# Patient Record
Sex: Female | Born: 1950 | Race: Asian | Hispanic: No | Marital: Married | Smoking: Never smoker
Health system: Southern US, Community
[De-identification: ages and names within clinical notes are randomized; demographics above are authoritative.]

## PROBLEM LIST (undated history)

## (undated) DIAGNOSIS — Z9289 Personal history of other medical treatment: Secondary | ICD-10-CM

## (undated) DIAGNOSIS — I10 Essential (primary) hypertension: Secondary | ICD-10-CM

## (undated) DIAGNOSIS — I2510 Atherosclerotic heart disease of native coronary artery without angina pectoris: Secondary | ICD-10-CM

## (undated) DIAGNOSIS — I5021 Acute systolic (congestive) heart failure: Secondary | ICD-10-CM

## (undated) DIAGNOSIS — E785 Hyperlipidemia, unspecified: Secondary | ICD-10-CM

## (undated) DIAGNOSIS — Z931 Gastrostomy status: Secondary | ICD-10-CM

## (undated) DIAGNOSIS — Z930 Tracheostomy status: Secondary | ICD-10-CM

## (undated) HISTORY — PX: TUBAL LIGATION: SHX77

---

## 1998-11-03 ENCOUNTER — Emergency Department (HOSPITAL_COMMUNITY): Admission: EM | Admit: 1998-11-03 | Discharge: 1998-11-03 | Payer: Self-pay | Admitting: Emergency Medicine

## 2003-05-31 ENCOUNTER — Emergency Department (HOSPITAL_COMMUNITY): Admission: EM | Admit: 2003-05-31 | Discharge: 2003-06-01 | Payer: Self-pay | Admitting: Emergency Medicine

## 2011-01-25 ENCOUNTER — Inpatient Hospital Stay (INDEPENDENT_AMBULATORY_CARE_PROVIDER_SITE_OTHER)
Admission: RE | Admit: 2011-01-25 | Discharge: 2011-01-25 | Disposition: A | Discharge status: Home / Self Care | Payer: No Typology Code available for payment source | Source: Ambulatory Visit | Attending: Family Medicine | Admitting: Family Medicine

## 2011-01-25 ENCOUNTER — Emergency Department (HOSPITAL_COMMUNITY)
Admission: EM | Admit: 2011-01-25 | Discharge: 2011-01-26 | Disposition: A | Discharge status: Home / Self Care | Payer: No Typology Code available for payment source | Attending: Emergency Medicine | Admitting: Emergency Medicine

## 2011-01-25 ENCOUNTER — Emergency Department (HOSPITAL_COMMUNITY): Payer: Self-pay

## 2011-01-25 DIAGNOSIS — S239XXA Sprain of unspecified parts of thorax, initial encounter: Secondary | ICD-10-CM | POA: Insufficient documentation

## 2011-01-25 DIAGNOSIS — S139XXA Sprain of joints and ligaments of unspecified parts of neck, initial encounter: Secondary | ICD-10-CM | POA: Insufficient documentation

## 2011-01-25 DIAGNOSIS — R030 Elevated blood-pressure reading, without diagnosis of hypertension: Secondary | ICD-10-CM | POA: Insufficient documentation

## 2011-01-25 DIAGNOSIS — M542 Cervicalgia: Secondary | ICD-10-CM | POA: Insufficient documentation

## 2011-01-25 DIAGNOSIS — M546 Pain in thoracic spine: Secondary | ICD-10-CM | POA: Insufficient documentation

## 2011-01-25 DIAGNOSIS — I10 Essential (primary) hypertension: Secondary | ICD-10-CM

## 2013-05-20 ENCOUNTER — Ambulatory Visit: Payer: Managed Care, Other (non HMO)

## 2014-07-10 ENCOUNTER — Inpatient Hospital Stay (HOSPITAL_COMMUNITY)
Admission: EM | Admit: 2014-07-10 | Discharge: 2014-07-15 | DRG: 280 | Disposition: A | Discharge status: Home / Self Care | Payer: Managed Care, Other (non HMO) | Attending: Internal Medicine | Admitting: Internal Medicine

## 2014-07-10 ENCOUNTER — Ambulatory Visit (INDEPENDENT_AMBULATORY_CARE_PROVIDER_SITE_OTHER): Payer: Managed Care, Other (non HMO)

## 2014-07-10 ENCOUNTER — Encounter (HOSPITAL_COMMUNITY): Payer: Self-pay

## 2014-07-10 ENCOUNTER — Ambulatory Visit (INDEPENDENT_AMBULATORY_CARE_PROVIDER_SITE_OTHER): Payer: Managed Care, Other (non HMO) | Admitting: Family Medicine

## 2014-07-10 VITALS — BP 132/80 | HR 108 | Temp 97.9°F | Resp 18 | Ht 59.5 in | Wt 112.0 lb

## 2014-07-10 DIAGNOSIS — I509 Heart failure, unspecified: Secondary | ICD-10-CM

## 2014-07-10 DIAGNOSIS — D72829 Elevated white blood cell count, unspecified: Secondary | ICD-10-CM | POA: Diagnosis present

## 2014-07-10 DIAGNOSIS — I255 Ischemic cardiomyopathy: Secondary | ICD-10-CM | POA: Diagnosis not present

## 2014-07-10 DIAGNOSIS — R0600 Dyspnea, unspecified: Secondary | ICD-10-CM | POA: Diagnosis not present

## 2014-07-10 DIAGNOSIS — E785 Hyperlipidemia, unspecified: Secondary | ICD-10-CM

## 2014-07-10 DIAGNOSIS — I340 Nonrheumatic mitral (valve) insufficiency: Secondary | ICD-10-CM

## 2014-07-10 DIAGNOSIS — R942 Abnormal results of pulmonary function studies: Secondary | ICD-10-CM | POA: Diagnosis not present

## 2014-07-10 DIAGNOSIS — D649 Anemia, unspecified: Secondary | ICD-10-CM

## 2014-07-10 DIAGNOSIS — I214 Non-ST elevation (NSTEMI) myocardial infarction: Secondary | ICD-10-CM | POA: Diagnosis present

## 2014-07-10 DIAGNOSIS — I5022 Chronic systolic (congestive) heart failure: Secondary | ICD-10-CM | POA: Insufficient documentation

## 2014-07-10 DIAGNOSIS — Z7982 Long term (current) use of aspirin: Secondary | ICD-10-CM

## 2014-07-10 DIAGNOSIS — R0789 Other chest pain: Secondary | ICD-10-CM

## 2014-07-10 DIAGNOSIS — R05 Cough: Secondary | ICD-10-CM

## 2014-07-10 DIAGNOSIS — I2510 Atherosclerotic heart disease of native coronary artery without angina pectoris: Secondary | ICD-10-CM | POA: Diagnosis not present

## 2014-07-10 DIAGNOSIS — R7989 Other specified abnormal findings of blood chemistry: Secondary | ICD-10-CM

## 2014-07-10 DIAGNOSIS — R9431 Abnormal electrocardiogram [ECG] [EKG]: Secondary | ICD-10-CM

## 2014-07-10 DIAGNOSIS — R0602 Shortness of breath: Secondary | ICD-10-CM

## 2014-07-10 DIAGNOSIS — R059 Cough, unspecified: Secondary | ICD-10-CM

## 2014-07-10 DIAGNOSIS — I5021 Acute systolic (congestive) heart failure: Secondary | ICD-10-CM | POA: Diagnosis present

## 2014-07-10 DIAGNOSIS — R778 Other specified abnormalities of plasma proteins: Secondary | ICD-10-CM

## 2014-07-10 HISTORY — DX: Essential (primary) hypertension: I10

## 2014-07-10 HISTORY — DX: Hyperlipidemia, unspecified: E78.5

## 2014-07-10 HISTORY — DX: Acute systolic (congestive) heart failure: I50.21

## 2014-07-10 LAB — URINALYSIS, ROUTINE W REFLEX MICROSCOPIC
BILIRUBIN URINE: NEGATIVE
Glucose, UA: NEGATIVE mg/dL
Hgb urine dipstick: NEGATIVE
KETONES UR: NEGATIVE mg/dL
NITRITE: NEGATIVE
PH: 6 (ref 5.0–8.0)
Protein, ur: NEGATIVE mg/dL
Specific Gravity, Urine: 1.007 (ref 1.005–1.030)
UROBILINOGEN UA: 0.2 mg/dL (ref 0.0–1.0)

## 2014-07-10 LAB — COMPREHENSIVE METABOLIC PANEL
ALBUMIN: 3.7 g/dL (ref 3.5–5.2)
ALT: 18 U/L (ref 0–35)
ANION GAP: 12 (ref 5–15)
AST: 27 U/L (ref 0–37)
Alkaline Phosphatase: 97 U/L (ref 39–117)
BILIRUBIN TOTAL: 0.5 mg/dL (ref 0.3–1.2)
BUN: 17 mg/dL (ref 6–23)
CHLORIDE: 107 mmol/L (ref 96–112)
CO2: 21 mmol/L (ref 19–32)
CREATININE: 0.75 mg/dL (ref 0.50–1.10)
Calcium: 8.9 mg/dL (ref 8.4–10.5)
GFR calc Af Amer: >90 mL/min (ref >90)
GFR, EST NON AFRICAN AMERICAN: 89 mL/min — AB (ref >90)
Glucose, Bld: 139 mg/dL — ABNORMAL HIGH (ref 70–99)
Potassium: 3.6 mmol/L (ref 3.5–5.1)
Sodium: 140 mmol/L (ref 135–145)
Total Protein: 8.4 g/dL — ABNORMAL HIGH (ref 6.0–8.3)

## 2014-07-10 LAB — PROTIME-INR
INR: 1.11 (ref 0.00–1.49)
PROTHROMBIN TIME: 14.4 s (ref 11.6–15.2)

## 2014-07-10 LAB — CBC WITH DIFFERENTIAL/PLATELET
BASOS ABS: 0 10*3/uL (ref 0.0–0.1)
Basophils Relative: 0 % (ref 0–1)
EOS PCT: 1 % (ref 0–5)
Eosinophils Absolute: 0.1 10*3/uL (ref 0.0–0.7)
HEMATOCRIT: 34.6 % — AB (ref 36.0–46.0)
HEMOGLOBIN: 11.2 g/dL — AB (ref 12.0–15.0)
LYMPHS ABS: 2.9 10*3/uL (ref 0.7–4.0)
LYMPHS PCT: 24 % (ref 12–46)
MCH: 25.6 pg — ABNORMAL LOW (ref 26.0–34.0)
MCHC: 32.4 g/dL (ref 30.0–36.0)
MCV: 79 fL (ref 78.0–100.0)
MONOS PCT: 4 % (ref 3–12)
Monocytes Absolute: 0.5 10*3/uL (ref 0.1–1.0)
NEUTROS ABS: 8.4 10*3/uL — AB (ref 1.7–7.7)
NEUTROS PCT: 71 % (ref 43–77)
Platelets: 357 10*3/uL (ref 150–400)
RBC: 4.38 MIL/uL (ref 3.87–5.11)
RDW: 15.7 % — ABNORMAL HIGH (ref 11.5–15.5)
WBC: 11.9 10*3/uL — AB (ref 4.0–10.5)

## 2014-07-10 LAB — TSH: TSH: 3.001 u[IU]/mL (ref 0.350–4.500)

## 2014-07-10 LAB — URINE MICROSCOPIC-ADD ON

## 2014-07-10 LAB — D-DIMER, QUANTITATIVE: D-Dimer, Quant: 1.1 ug/mL-FEU — ABNORMAL HIGH (ref 0.00–0.48)

## 2014-07-10 LAB — BRAIN NATRIURETIC PEPTIDE: B NATRIURETIC PEPTIDE 5: 1059.1 pg/mL — AB (ref 0.0–100.0)

## 2014-07-10 LAB — I-STAT TROPONIN, ED: TROPONIN I, POC: 0.82 ng/mL — AB (ref 0.00–0.08)

## 2014-07-10 LAB — TROPONIN I
TROPONIN I: 1.97 ng/mL — AB (ref <0.031)
Troponin I: 1.52 ng/mL (ref <0.031)

## 2014-07-10 MED ORDER — HEPARIN BOLUS VIA INFUSION
3000.0000 [IU] | Freq: Once | INTRAVENOUS | Status: AC
  Administered 2014-07-10: 3000 [IU] via INTRAVENOUS
  Filled 2014-07-10: qty 3000

## 2014-07-10 MED ORDER — SODIUM CHLORIDE 0.9 % IJ SOLN: 3.0000 mL | INTRAMUSCULAR | Status: DC | PRN

## 2014-07-10 MED ORDER — ACETAMINOPHEN 325 MG PO TABS: 650.0000 mg | ORAL_TABLET | ORAL | Status: DC | PRN

## 2014-07-10 MED ORDER — FUROSEMIDE 10 MG/ML IJ SOLN
40.0000 mg | Freq: Once | INTRAMUSCULAR | Status: AC
  Administered 2014-07-10: 40 mg via INTRAVENOUS
  Filled 2014-07-10: qty 4

## 2014-07-10 MED ORDER — FUROSEMIDE 10 MG/ML IJ SOLN
40.0000 mg | Freq: Every day | INTRAMUSCULAR | Status: DC
  Administered 2014-07-11 – 2014-07-12 (×2): 40 mg via INTRAVENOUS
  Filled 2014-07-10 (×3): qty 4

## 2014-07-10 MED ORDER — DIGOXIN 125 MCG PO TABS
0.1250 mg | ORAL_TABLET | Freq: Every day | ORAL | Status: DC
  Administered 2014-07-10 – 2014-07-13 (×4): 0.125 mg via ORAL
  Filled 2014-07-10 (×5): qty 1

## 2014-07-10 MED ORDER — SODIUM CHLORIDE 0.9 % IV SOLN
250.0000 mL | INTRAVENOUS | Status: DC | PRN
  Administered 2014-07-10 – 2014-07-13 (×3): 250 mL via INTRAVENOUS

## 2014-07-10 MED ORDER — COENZYME Q10 30 MG PO CAPS: 30.0000 mg | ORAL_CAPSULE | Freq: Every day | ORAL | Status: DC

## 2014-07-10 MED ORDER — ASPIRIN 81 MG PO CHEW
324.0000 mg | CHEWABLE_TABLET | Freq: Once | ORAL | Status: AC
  Administered 2014-07-10: 324 mg via ORAL
  Filled 2014-07-10: qty 4

## 2014-07-10 MED ORDER — SODIUM CHLORIDE 0.9 % IJ SOLN
3.0000 mL | Freq: Two times a day (BID) | INTRAMUSCULAR | Status: DC
  Administered 2014-07-10 – 2014-07-15 (×6): 3 mL via INTRAVENOUS

## 2014-07-10 MED ORDER — ADULT MULTIVITAMIN W/MINERALS CH
1.0000 | ORAL_TABLET | Freq: Every day | ORAL | Status: DC
  Administered 2014-07-10 – 2014-07-15 (×6): 1 via ORAL
  Filled 2014-07-10 (×6): qty 1

## 2014-07-10 MED ORDER — FUROSEMIDE 10 MG/ML IJ SOLN: 40.0000 mg | Freq: Two times a day (BID) | INTRAMUSCULAR | Status: DC

## 2014-07-10 MED ORDER — ASPIRIN 81 MG PO CHEW
81.0000 mg | CHEWABLE_TABLET | Freq: Every day | ORAL | Status: DC
  Administered 2014-07-11 – 2014-07-12 (×2): 81 mg via ORAL
  Filled 2014-07-10 (×3): qty 1

## 2014-07-10 MED ORDER — HEPARIN (PORCINE) IN NACL 100-0.45 UNIT/ML-% IJ SOLN
850.0000 [IU]/h | INTRAMUSCULAR | Status: DC
  Administered 2014-07-10 – 2014-07-11 (×2): 750 [IU]/h via INTRAVENOUS
  Administered 2014-07-13: 850 [IU]/h via INTRAVENOUS
  Filled 2014-07-10 (×5): qty 250

## 2014-07-10 MED ORDER — SPIRONOLACTONE 12.5 MG HALF TABLET
12.5000 mg | ORAL_TABLET | Freq: Every day | ORAL | Status: DC
  Administered 2014-07-10 – 2014-07-15 (×6): 12.5 mg via ORAL
  Filled 2014-07-10 (×6): qty 1

## 2014-07-10 MED ORDER — LISINOPRIL 2.5 MG PO TABS
2.5000 mg | ORAL_TABLET | Freq: Every day | ORAL | Status: DC
  Administered 2014-07-10 – 2014-07-15 (×6): 2.5 mg via ORAL
  Filled 2014-07-10 (×6): qty 1

## 2014-07-10 MED ORDER — HEPARIN SODIUM (PORCINE) 5000 UNIT/ML IJ SOLN
5000.0000 [IU] | Freq: Three times a day (TID) | INTRAMUSCULAR | Status: DC
  Administered 2014-07-10: 5000 [IU] via SUBCUTANEOUS

## 2014-07-10 MED ORDER — ONDANSETRON HCL 4 MG/2ML IJ SOLN
4.0000 mg | Freq: Four times a day (QID) | INTRAMUSCULAR | Status: DC | PRN
Start: 2014-07-10 — End: 2014-07-13

## 2014-07-10 MED ORDER — ASPIRIN 325 MG PO TABS: 325.0000 mg | ORAL_TABLET | Freq: Once | ORAL | Status: DC

## 2014-07-10 NOTE — H&P (Signed)
Patient ID: Breanna Alvarez MRN: [id number], DOB/AGE: 09/28/1950   Admit date: 07/10/2014   Primary Physician: No PCP Per Patient Primary Cardiologist: New   Pt. Profile:  Breanna Alvarez is a 64 year old [place] female with past medical history of hyperlipidemia who does not seek medication attention regularly who presented with 2 wks onset of dyspnea. Referred to [facility] on 07/10/2014 after seen at local urgent care for symptom concerning for HF  Problem List  Past Medical History  Diagnosis Date  . Hyperlipidemia     Past Surgical History  Procedure Laterality Date  . No past surgeries       Allergies  No Known Allergies  HPI  Breanna Alvarez is a 64 year old [place] female with past medical history of hyperlipidemia. According to the patient, Breanna Alvarez has never been ill before and does not seek medical attention regularly. Breanna Alvarez has no known cardiac history and does not have a PCP or cardiologist. 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 Breanna Alvarez states Breanna Alvarez doesn't always wear facemask during work unless specific chemical dictate the need for facemask. Breanna Alvarez has been active over a year ago and has not done much activity-wise in the last year. Breanna Alvarez denies any recent exertional chest pain. For the past 2 weeks, Breanna Alvarez has been having increasing shortness of breath worse with exertion. Breanna Alvarez noted increasing productive cough with clear phlegm. Breanna Alvarez denies any significant lower extremity edema, however for the past 2 days, Breanna Alvarez has noticed significant orthopnea and paroxysmal nocturnal dyspnea to the degree that Breanna Alvarez has not slept in the last 2 days.  Breanna Alvarez finally decided to seek medical attention at [facility] urgent care on 07/10/2014. Physical exam was consistent with heart failure include rale on exam and significant JVD. Breanna Alvarez was referred to [facility] for further evaluation. On arrival her blood pressure was 153/79. Breanna Alvarez was tachypneic in the 20s to 30s. Heart rate was elevated  in the 100s. O2 sat 93% on room air, and was placed on nasal cannula. Chest x-ray consistent with bilateral pulmonary edema with minimally associated pleural effusions. EKG was severely abnormal with T-wave inversion throughout the entire inferior lead, and V4 through V6. Patient also had what appears to be ST depression in the inferior lead despite having no chest pain at all. Breanna Alvarez was given 324 of aspirin and 40 mg of IV Lasix. Cardiology has been consulted for heart failure.   Labs are currently pending at this time.   Home Medications  Prior to Admission medications   Medication Sig Start Date End Date Taking? Authorizing Provider  aspirin 81 MG tablet Take 81 mg by mouth daily.   Yes Historical Provider, MD  co-enzyme Q-10 30 MG capsule Take 30 mg by mouth daily.   Yes Historical Provider, MD  Multiple Vitamin (MULTIVITAMIN) tablet Take 1 tablet by mouth daily.   Yes Historical Provider, MD    Family History  Family History  Problem Relation Age of Onset  . Heart attack Mother     MI when Breanna Alvarez was 60-70s, died at age 31  . Stroke Father     died when patient was 37 years old    Social History  History   Social History  . Marital Status: Married    Spouse Name: N/A  . Number of Children: N/A  . Years of Education: N/A   Occupational History  . Not on file.   Social History Main Topics  . Smoking status: Never Smoker   .  Smokeless tobacco: Not on file  . Alcohol Use: No  . Drug Use: No  . Sexual Activity: Not on file   Other Topics Concern  . Not on file   Social History Narrative     Review of Systems General:  No chills, fever, night sweats or weight changes.  Cardiovascular:  No chest pain, edema +dyspnea on exertion, orthopnea, paroxysmal nocturnal dyspnea. Dermatological: No rash, lesions/masses Respiratory: +cough, dyspnea Urologic: No hematuria, dysuria Abdominal:   No nausea, vomiting, diarrhea, bright red blood per rectum, melena, or  hematemesis Neurologic:  No visual changes, wkns, changes in mental status. All other systems reviewed and are otherwise negative except as noted above.  Physical Exam  Blood pressure 158/71, pulse 102, temperature 98.1 F (36.7 C), temperature source Oral, resp. rate 23, SpO2 100 %.  General: Pleasant, NAD Psych: Normal affect. Neuro: Alert and oriented X 3. Moves all extremities spontaneously. HEENT: Normal  Neck: Supple without bruits + significant JVD. Lungs:  Resp regular and unlabored. Significant rale on bilateral bases 1/3 way up Heart: Mildly tachycardic. no s3, s4, or 2/6 MR. Abdomen: Soft, non-tender, non-distended, BS + x 4.  Extremities: No clubbing, cyanosis or edema. DP/PT/Radials 2+ and equal bilaterally.    Radiology/Studies  Dg Chest 2 View  07/10/2014   CLINICAL DATA:  Cough, chest pressure.  EXAM: CHEST  2 VIEW  COMPARISON:  None.  FINDINGS: The heart size and mediastinal contours are within normal limits. No pneumothorax is noted. Minimal bilateral pleural effusions are noted. Bilateral perihilar and basilar interstitial opacities are noted most consistent with pulmonary edema . The visualized skeletal structures are unremarkable.  IMPRESSION: Findings consistent with bilateral pulmonary edema with minimal associated pleural effusions.   Electronically Signed   By: Lupita RaiderJames  Green Jr, M.D.   On: 07/10/2014 11:58    ECG  EKG was severely abnormal with T-wave inversion throughout the entire inferior lead, and V4 through V6, possible ST depression in inferior lead. LVH with repol.   Echocardiogram  Pending stat echo    ASSESSMENT AND PLAN  1. Acute heart failure, unclear used systolic versus diastolic  - Significant EKG changes concerning for underlying ischemia, despite having no chest pain.  - Pending stat echocardiogram and laboratory findings  - IV diuresis for now. Trend enzyme, if positive, will start IV heparin  - If has LV dysfunction noted on echo and  renal function is okay, will discuss with M.D. regarding need for possible left and right heart cath at some point.  - Add ACEI for now. Will add Coreg once HF resolve. Continue ASA.   Addendum: labs back, trop somewhat elevated, discussed with Dr. Gala RomneyBensimhon, will start subcu heparin, plan for L&RHC on Monday to determine if underlying ischemic vs Takotsubo, and diuresis for now  2. Dyspnea on exertion: see #1  3. HLD  - Not on any statin medication  4. Lack of medical followup: check A1C, lipid panel, TSH  5. Elevated trop: likely in the setting of HF, however will need cath anyway  6. Elevated D-dimer: discussed with Dr. Gala RomneyBensimhon, no further workup for now as patient is obviously tachycardic with HF symptom  Signed, Azalee CourseMeng, Hao, PA-C 07/10/2014, 1:23 PM  Patient seen and examined with Azalee CourseHao Meng, PA-C. We discussed all aspects of the encounter. I agree with the assessment and plan as stated above.  Breanna Alvarez has markedly decompensated HF. Echo reviewed at bedside EF in the 30% range. Distal 1/2 of LV and inferior wall are AK. Unclear  if this is tako-tsubo or ischemic. Will admit for IV diuresis. Start dig, spiro and low-dose ACE. No b-blocker yet. Plan R/L cath Monday. HF team will follow.   Daniel Bensimhon,MD 2:45 PM

## 2014-07-10 NOTE — Plan of Care (Signed)
Problem: Phase I Progression Outcomes Goal: EF % per last Echo/documented,Core Reminder form on chart Outcome: Completed/Met Date Met:  07/10/14 Echo this admit while in ED, PA note documented EF 30%.

## 2014-07-10 NOTE — ED Notes (Signed)
PER [facility]: pt. Is here from [facility]. Was seen today for SOB x 2 weeks with exertion. Pt. Denies CP/N/V/D. Pt. Axo x4.

## 2014-07-10 NOTE — Progress Notes (Signed)
ANTICOAGULATION CONSULT NOTE - Initial Consult  Pharmacy Consult for heparin Indication: chest pain/ACS  No Known Allergies  Patient Measurements: Height: 4\' 11"  (149.9 cm) Weight: 109 lb 4.8 oz (49.578 kg) (scale b) IBW/kg (Calculated) : 43.2 Heparin Dosing Weight: 49kg  Vital Signs: Temp: 98.7 F (37.1 C) (03/04 1624) Temp Source: Oral (03/04 1624) BP: 150/72 mmHg (03/04 1624) Pulse Rate: 103 (03/04 1624)  Labs:  Recent Labs  07/10/14 1315 07/10/14 1655  HGB 11.2*  --   HCT 34.6*  --   PLT 357  --   LABPROT 14.4  --   INR 1.11  --   CREATININE 0.75  --   TROPONINI  --  1.52*    Estimated Creatinine Clearance: 49.7 mL/min (by C-G formula based on Cr of 0.75).   Medical History: Past Medical History  Diagnosis Date  . Hyperlipidemia   . Medical history non-contributory     Medications:  Scheduled:  . [START ON 07/11/2014] aspirin  81 mg Oral Daily  . digoxin  0.125 mg Oral Daily  . [START ON 07/11/2014] furosemide  40 mg Intravenous Daily  . lisinopril  2.5 mg Oral Daily  . multivitamin with minerals  1 tablet Oral Daily  . sodium chloride  3 mL Intravenous Q12H  . spironolactone  12.5 mg Oral Daily   Infusions:    Assessment: 64 yo who was admitted for symptoms of HF. Her troponin is trending up so IV heparin has been ordered for anticoagulation. Pt is not on any anticoag PTA. Baseline PT is fine. CBC ok.   Goal of Therapy:  Heparin level 0.3-0.7 units/ml Monitor platelets by anticoagulation protocol: Yes   Plan:   Heparin bolus 3000 units x1 Heparin drip at 750 units/hr Check 6 hr heparin level Daily level and CBC  Ulyses SouthwardMinh Sherri Mcarthy, PharmD Pager: [phone] 07/10/2014 6:08 PM

## 2014-07-10 NOTE — Progress Notes (Signed)
Echocardiogram 2D Echocardiogram has been performed.  Dorothey BasemanReel, Heather Streeper M 07/10/2014, 1:11 PM

## 2014-07-10 NOTE — Patient Instructions (Signed)
We are sending you to the hospital for new onset heart failure and possible recent heart attack.  After hospitalization, you can return here for follow up if needed.

## 2014-07-10 NOTE — Progress Notes (Signed)
Troponin results 1.52, called to Dr Gala RomneyBensimhon.

## 2014-07-10 NOTE — Progress Notes (Addendum)
This chart was scribed for Breanna StaggersJeffrey Starsky Nanna, MD by Luisa DagoPriscilla Tutu, ED Scribe. This patient was seen in room 12 and the patient's care was started at 10:38 AM.  Subjective:    Patient ID: Breanna Alvarez, female    DOB: 17-Feb-1951, 64 y.o.   MRN: [id number]  Chief Complaint  Patient presents with  . Insomnia    x2 weeks   . Shortness of Breath  . Cough    white mucous  . Wheezing    HPI Breanna Alvarez is a 64 y.o. female with no active problem list below. She presents to the office today with multiple complaints.  Pt states that for the past two weeks she has been experiencing some moderate SOB, which she says is exacerbated by exertion. She also states that has noted some associated wheezing for the past 2 weeks, but it is more noticeable when she lays supine at bedtime. Secondary to her symptoms she's also been experiencing insomnia since the onset of her symptoms. Positive productive cough as well and chest pain which she describes as a "pressure" in nature. She denies any hx of or heart problems, fever, chills, diaphoresis, nausea, emesis, or abdominal pain.   There are no active problems to display for this patient.  History reviewed. No pertinent past medical history. History reviewed. No pertinent past surgical history. No Known Allergies Prior to Admission medications   Not on File   History   Social History  . Marital Status: Married    Spouse Name: N/A  . Number of Children: N/A  . Years of Education: N/A   Occupational History  . Not on file.   Social History Main Topics  . Smoking status: Never Smoker   . Smokeless tobacco: Not on file  . Alcohol Use: No  . Drug Use: No  . Sexual Activity: Not on file   Other Topics Concern  . Not on file   Social History Narrative  . No narrative on file     Review of Systems  Constitutional: Negative for fever and chills.  HENT: Positive for congestion.   Respiratory: Positive for cough and shortness of breath.  Negative for chest tightness.   Cardiovascular: Positive for chest pain. Negative for palpitations and leg swelling.  Gastrointestinal: Negative for abdominal pain and blood in stool.  Skin: Negative for rash.  Neurological: Negative for dizziness, syncope, light-headedness and headaches.       Objective:   Physical Exam  Constitutional: She is oriented to person, place, and time. She appears well-developed and well-nourished.  HENT:  Head: Normocephalic and atraumatic.  Right Ear: Tympanic membrane and ear canal normal.  Left Ear: Tympanic membrane and ear canal normal.  Nose: Mucosal edema and rhinorrhea present.  Mouth/Throat: Uvula is midline, oropharynx is clear and moist and mucous membranes are normal. No oropharyngeal exudate, posterior oropharyngeal edema, posterior oropharyngeal erythema or tonsillar abscesses.  Eyes: Conjunctivae are normal.  Cardiovascular: Normal heart sounds.  Tachycardia present.   Tachycardic proximately 100.   Pulmonary/Chest: Effort normal. No respiratory distress. She has no wheezes. She has no rales.  Few coarse breath sounds with rales right lower lobe greater than left lower lobe, but has good air exchange. No audible wheezing.   Abdominal: Soft. There is no tenderness.  Musculoskeletal: Normal range of motion.  Neurological: She is alert and oriented to person, place, and time.  Skin: Skin is warm and dry. No rash noted.  Psychiatric: She has a normal mood and affect.  Filed Vitals:   07/10/14 1025  BP: 132/80  Pulse: 108  Temp: 97.9 F (36.6 C)  TempSrc: Oral  Resp: 18  Height: 4' 11.5" (1.511 m)  Weight: 112 lb (50.803 kg)  SpO2: 99%   [facility] reading (PRIMARY) by  Dr. Neva Seat. CXR: cardiomegaly. Diffuse increased markings consistent with failure.   EKG: sinus thachycardia rate 107. ST depression with T-wave inversion on lateral leads, leads 1, and lead 2 with nonspecific ST change in anterior leads.   11:33 AM- EMS called for  transport. [facility] placed at [facility] for [facility]. Charge nurse at [facility] advised.   Assessment & Plan:   [name] is a 64 y.o. female Chest pressure - Plan: EKG 12-Lead, DG Chest 2 View  Cough - Plan: EKG 12-Lead, DG Chest 2 View  Nonspecific abnormal electrocardiogram (ECG) (EKG)  Acute congestive heart failure, unspecified congestive heart failure type  2 week history of chest congestion, DOE, orthopnea, with intermittent chest pressure.  Denies chest pain/pressure at this time. EKG concerning for recent anteroseptal MI, with subsequent new onset CHF.   -IV placed for access only, EMS called for transport, and charge nurse at [facility] advised.  Transport for further eval in ER and workup of new onset CHF.   No orders of the defined types were placed in this encounter.   Patient Instructions  We are sending you to the hospital for new onset heart failure and possible recent heart attack.  After hospitalization, you can return here for follow up if needed.      I personally performed the services described in this documentation, which was scribed in my presence. The recorded information has been reviewed and considered, and addended by me as needed.

## 2014-07-10 NOTE — Progress Notes (Signed)
Trop continue to increase, will start IV heparin.  Ramond DialSigned, Ewart Carrera PA Pager: [phone]

## 2014-07-10 NOTE — ED Provider Notes (Signed)
CSN: [id number]     Arrival date & time 07/10/14  1215 History   First MD Initiated Contact with Patient 07/10/14 1224     Chief Complaint  Patient presents with  . Shortness of Breath     (Consider location/radiation/quality/duration/timing/severity/associated sxs/prior Treatment) HPI The patient reports she's been developing shortness of breath for 2 weeks duration. She denies that she's had chest pain in association with this. She reports over the past 2 days has gotten significantly worse. The patient reports as of last night she was unable to sleep lying down. She denies she's had any fever. She reports just today she has started to cough a little bit and have some clear mucus. There is been no fever and association with this. She denies she's had swelling in her legs. The patient has never had similar problems. She reports she has no past medical history. She denies any travel history. No history of heart problems or lung problems. The patient reports she has been going to work as per usual, today she had to leave work early because she was too fatigued and short of breath. Past Medical History  Diagnosis Date  . Hyperlipidemia    Past Surgical History  Procedure Laterality Date  . No past surgeries     Family History  Problem Relation Age of Onset  . Heart attack Mother     MI when she was 60-70s, died at age 75  . Stroke Father     died when patient was 68 years old   History  Substance Use Topics  . Smoking status: Never Smoker   . Smokeless tobacco: Not on file  . Alcohol Use: No   OB History    No data available     Review of Systems 10 Systems reviewed and are negative for acute change except as noted in the HPI.    Allergies  Review of patient's allergies indicates no known allergies.  Home Medications   Prior to Admission medications   Medication Sig Start Date End Date Taking? Authorizing Provider  aspirin 81 MG tablet Take 81 mg by mouth daily.   Yes  Historical Provider, MD  co-enzyme Q-10 30 MG capsule Take 30 mg by mouth daily.   Yes Historical Provider, MD  Multiple Vitamin (MULTIVITAMIN) tablet Take 1 tablet by mouth daily.   Yes Historical Provider, MD   BP 157/76 mmHg  Pulse 109  Temp(Src) 98.1 F (36.7 C) (Oral)  Resp 29  Ht 4' 11.5" (1.511 m)  Wt 112 lb 6.4 oz (50.984 kg)  BMI 22.33 kg/m2  SpO2 100% Physical Exam  Constitutional: She is oriented to person, place, and time. She appears well-developed and well-nourished.  The patient is alert and nontoxic. She has mild to moderate respiratory distress at rest. Her mental status is clear.  HENT:  Head: Normocephalic and atraumatic.  Eyes: EOM are normal. Pupils are equal, round, and reactive to light.  Neck: Neck supple. JVD present.  Cardiovascular: Regular rhythm and intact distal pulses.   Murmur heard. The patient is mildly tachycardic at approximately 100 bpm. There is a 2/6 systolic ejection murmur.  Pulmonary/Chest: She is in respiratory distress. She has rales.  Patient has some mild increased work of breathing. She is speaking in full sentences. She has tachypnea.  Abdominal: Soft. Bowel sounds are normal. She exhibits no distension. There is no tenderness.  Musculoskeletal: Normal range of motion. She exhibits no edema or tenderness.  Bilateral lower extremities show no edema and  are soft and nontender.  Neurological: She is alert and oriented to person, place, and time. She has normal strength. Coordination normal. GCS eye subscore is 4. GCS verbal subscore is 5. GCS motor subscore is 6.  Skin: Skin is warm, dry and intact.  Psychiatric: She has a normal mood and affect.    ED Course  Procedures (including critical care time) Labs Review Labs Reviewed  COMPREHENSIVE METABOLIC PANEL - Abnormal; Notable for the following:    Glucose, Bld 139 (*)    Total Protein 8.4 (*)    GFR calc non Af Amer 89 (*)    All other components within normal limits  BRAIN  NATRIURETIC PEPTIDE - Abnormal; Notable for the following:    B Natriuretic Peptide 1059.1 (*)    All other components within normal limits  CBC WITH DIFFERENTIAL/PLATELET - Abnormal; Notable for the following:    WBC 11.9 (*)    Hemoglobin 11.2 (*)    HCT 34.6 (*)    MCH 25.6 (*)    RDW 15.7 (*)    Neutro Abs 8.4 (*)    All other components within normal limits  D-DIMER, QUANTITATIVE - Abnormal; Notable for the following:    D-Dimer, Quant 1.10 (*)    All other components within normal limits  URINALYSIS, ROUTINE W REFLEX MICROSCOPIC - Abnormal; Notable for the following:    Leukocytes, UA TRACE (*)    All other components within normal limits  I-STAT TROPOININ, ED - Abnormal; Notable for the following:    Troponin i, poc 0.82 (*)    All other components within normal limits  CULTURE, BLOOD (ROUTINE X 2)  CULTURE, BLOOD (ROUTINE X 2)  PROTIME-INR  URINE MICROSCOPIC-ADD ON  TSH    Imaging Review Dg Chest 2 View  07/10/2014   CLINICAL DATA:  Cough, chest pressure.  EXAM: CHEST  2 VIEW  COMPARISON:  None.  FINDINGS: The heart size and mediastinal contours are within normal limits. No pneumothorax is noted. Minimal bilateral pleural effusions are noted. Bilateral perihilar and basilar interstitial opacities are noted most consistent with pulmonary edema . The visualized skeletal structures are unremarkable.  IMPRESSION: Findings consistent with bilateral pulmonary edema with minimal associated pleural effusions.   Electronically Signed   By: Lupita Raider, M.D.   On: 07/10/2014 11:58     EKG Interpretation   Date/Time:  Friday July 10 2014 12:21:03 EST Ventricular Rate:  104 PR Interval:  157 QRS Duration: 86 QT Interval:  356 QTC Calculation: 468 R Axis:   55 Text Interpretation:  Sinus tachycardia Anteroseptal infarct, old Repol  abnrm, severe global ischemia (LM/MVD) agree.  Confirmed by Donnald Garre, MD,  Lebron Conners [id number]) on 07/10/2014 12:25:01 PM     CRITICAL CARE Performed  by: Arby Barrette   Total critical care time: 45  Critical care time was exclusive of separately billable procedures and treating other patients.  Critical care was necessary to treat or prevent imminent or life-threatening deterioration.  Critical care was time spent personally by me on the following activities: development of treatment plan with patient and/or surrogate as well as nursing, discussions with consultants, evaluation of patient's response to treatment, examination of patient, obtaining history from patient or surrogate, ordering and performing treatments and interventions, ordering and review of laboratory studies, ordering and review of radiographic studies, pulse oximetry and re-evaluation of patient's condition. MDM   Final diagnoses:  Acute congestive heart failure, unspecified congestive heart failure type  EKG abnormality  Elevated troponin  The patient presents as outlined above. Physical examination and chest x-ray are consistent with congestive heart failure. The patient was given aspirin and a dose of Lasix IV. Bedside cardiac echo was ordered for concern of significantly ischemic EKG changes in conjunction with CHF. Cardiology was consult. With interventions of supplemental oxygen and Lasix the patient experienced significant subjective improvement. She did not at any point have chest pain complaint. Cardiology will evaluate further for potential cardiac ischemic disease versus other valvular or structural etiologies.    Arby BarretteMarcy Darragh Nay, MD 07/10/14 [phone]

## 2014-07-11 DIAGNOSIS — I214 Non-ST elevation (NSTEMI) myocardial infarction: Principal | ICD-10-CM

## 2014-07-11 DIAGNOSIS — E785 Hyperlipidemia, unspecified: Secondary | ICD-10-CM

## 2014-07-11 DIAGNOSIS — I255 Ischemic cardiomyopathy: Secondary | ICD-10-CM

## 2014-07-11 LAB — LIPID PANEL
Cholesterol: 347 mg/dL — ABNORMAL HIGH (ref 0–200)
HDL: 27 mg/dL — ABNORMAL LOW (ref >39)
LDL CALC: 300 mg/dL — AB (ref 0–99)
TRIGLYCERIDES: 100 mg/dL (ref <150)
Total CHOL/HDL Ratio: 12.9 RATIO
VLDL: 20 mg/dL (ref 0–40)

## 2014-07-11 LAB — BASIC METABOLIC PANEL
ANION GAP: 9 (ref 5–15)
BUN: 19 mg/dL (ref 6–23)
CO2: 26 mmol/L (ref 19–32)
Calcium: 8.7 mg/dL (ref 8.4–10.5)
Chloride: 105 mmol/L (ref 96–112)
Creatinine, Ser: 0.85 mg/dL (ref 0.50–1.10)
GFR calc Af Amer: 83 mL/min — ABNORMAL LOW (ref >90)
GFR, EST NON AFRICAN AMERICAN: 72 mL/min — AB (ref >90)
GLUCOSE: 122 mg/dL — AB (ref 70–99)
POTASSIUM: 3.6 mmol/L (ref 3.5–5.1)
Sodium: 140 mmol/L (ref 135–145)

## 2014-07-11 LAB — HEPARIN LEVEL (UNFRACTIONATED)
HEPARIN UNFRACTIONATED: 0.52 [IU]/mL (ref 0.30–0.70)
Heparin Unfractionated: 0.4 IU/mL (ref 0.30–0.70)

## 2014-07-11 LAB — TROPONIN I: Troponin I: 1.99 ng/mL (ref <0.031)

## 2014-07-11 MED ORDER — METOPROLOL SUCCINATE 12.5 MG HALF TABLET
12.5000 mg | ORAL_TABLET | Freq: Every day | ORAL | Status: DC
  Administered 2014-07-11 – 2014-07-12 (×2): 12.5 mg via ORAL
  Filled 2014-07-11 (×2): qty 1

## 2014-07-11 MED ORDER — CARVEDILOL 3.125 MG PO TABS: 3.1250 mg | ORAL_TABLET | Freq: Two times a day (BID) | ORAL | Status: DC

## 2014-07-11 MED ORDER — ATORVASTATIN CALCIUM 80 MG PO TABS
80.0000 mg | ORAL_TABLET | Freq: Every day | ORAL | Status: DC
  Administered 2014-07-11 – 2014-07-14 (×4): 80 mg via ORAL
  Filled 2014-07-11 (×5): qty 1

## 2014-07-11 NOTE — Progress Notes (Addendum)
SUBJECTIVE: Pt denies chest pain. Says breathing has improved considerably. Originally from [place] ([place]     Intake/Output Summary (Last 24 hours) at 07/11/14 1306 Last data filed at 07/11/14 1015  Gross per 24 hour  Intake 513.46 ml  Output   1021 ml  Net -507.54 ml    Current Facility-Administered Medications  Medication Dose Route Frequency Provider Last Rate Last Dose  . 0.9 %  sodium chloride infusion  250 mL Intravenous PRN Azalee Course, PA 10 mL/hr at 07/10/14 2040 250 mL at 07/10/14 2040  . acetaminophen (TYLENOL) tablet 650 mg  650 mg Oral Q4H PRN Azalee Course, PA      . aspirin chewable tablet 81 mg  81 mg Oral Daily Azalee Course, PA   81 mg at 07/11/14 1018  . digoxin (LANOXIN) tablet 0.125 mg  0.125 mg Oral Daily Dolores Patty, MD   0.125 mg at 07/11/14 1018  . furosemide (LASIX) injection 40 mg  40 mg Intravenous Daily Azalee Course, PA   40 mg at 07/11/14 1015  . heparin ADULT infusion 100 units/mL (25000 units/250 mL)  750 Units/hr Intravenous Continuous Bevelyn Buckles Bensimhon, MD 7.5 mL/hr at 07/11/14 0600 750 Units/hr at 07/11/14 0600  . lisinopril (PRINIVIL,ZESTRIL) tablet 2.5 mg  2.5 mg Oral Daily Azalee Course, PA   2.5 mg at 07/11/14 1027  . multivitamin with minerals tablet 1 tablet  1 tablet Oral Daily Azalee Course, [place]   1 tablet at 07/11/14 1019  . ondansetron (ZOFRAN) injection 4 mg  4 mg Intravenous Q6H PRN Azalee Course, PA      . sodium chloride 0.9 % injection 3 mL  3 mL Intravenous Q12H Azalee Course, PA   3 mL at 07/11/14 1015  . sodium chloride 0.9 % injection 3 mL  3 mL Intravenous PRN Azalee Course, PA      . spironolactone (ALDACTONE) tablet 12.5 mg  12.5 mg Oral Daily Dolores Patty, MD   12.5 mg at 07/11/14 1019    Filed Vitals:   07/10/14 2017 07/11/14 0620 07/11/14 1018 07/11/14 1026  BP: 121/64 126/69  112/65  Pulse: 97 94 86 87  Temp: 98.1 F (36.7 C) 98.1 F (36.7 C)  98.1 F (36.7 C)  TempSrc: Oral Oral  Oral  Resp: Height:      Weight:  108 lb  4.8 oz (49.125 kg)    SpO2: 100% 100%  100%    PHYSICAL EXAM General: NAD HEENT: Normal. Neck: No JVD, no thyromegaly.  Lungs: Clear to auscultation bilaterally with normal respiratory effort. CV: Nondisplaced PMI.  Regular rate and rhythm, normal S1/S2, no S3/S4, no murmur.  No pretibial edema.   Abdomen: Soft, nontender, no hepatosplenomegaly, no distention.  Neurologic: Alert and oriented x 3.  Psych: Normal affect. Musculoskeletal: Normal range of motion. No gross deformities. Extremities: No clubbing or cyanosis.   TELEMETRY: Reviewed telemetry pt in sinus rhythm with ST depressions and T wave inversions.  LABS: Basic Metabolic Panel:  Recent Labs  16/10/96 1315 07/11/14 0425  NA 140 140  K 3.6 3.6  CL 107 105  CO2 21 26  GLUCOSE 139* 122*  BUN 17 19  CREATININE 0.75 0.85  CALCIUM 8.9 8.7   Liver Function Tests:  Recent Labs  07/10/14 1315  AST 27  ALT 18  ALKPHOS 97  BILITOT 0.5  PROT 8.4*  ALBUMIN 3.7   No results for input(s): LIPASE, AMYLASE in the  last 72 hours. CBC:  Recent Labs  07/10/14 1315  WBC 11.9*  NEUTROABS 8.4*  HGB 11.2*  HCT 34.6*  MCV 79.0  PLT 357   Cardiac Enzymes:  Recent Labs  07/10/14 1655 07/10/14 2211 07/11/14 0425  TROPONINI 1.52* 1.97* 1.99*   BNP: Invalid input(s): POCBNP D-Dimer:  Recent Labs  07/10/14 1315  DDIMER 1.10*   Hemoglobin A1C: No results for input(s): HGBA1C in the last 72 hours. Fasting Lipid Panel:  Recent Labs  07/11/14 0425  CHOL 347*  HDL 27*  LDLCALC 300*  TRIG 100  CHOLHDL 12.9   Thyroid Function Tests:  Recent Labs  07/10/14 1426  TSH 3.001   Anemia Panel: No results for input(s): VITAMINB12, FOLATE, FERRITIN, TIBC, IRON, RETICCTPCT in the last 72 hours.  RADIOLOGY: Dg Chest 2 View  07/10/2014   CLINICAL DATA:  Cough, chest pressure.  EXAM: CHEST  2 VIEW  COMPARISON:  None.  FINDINGS: The heart size and mediastinal contours are within normal limits. No  pneumothorax is noted. Minimal bilateral pleural effusions are noted. Bilateral perihilar and basilar interstitial opacities are noted most consistent with pulmonary edema . The visualized skeletal structures are unremarkable.  IMPRESSION: Findings consistent with bilateral pulmonary edema with minimal associated pleural effusions.   Electronically Signed   By: Lupita RaiderJames  Green Jr, M.D.   On: 07/10/2014 11:58      ASSESSMENT AND PLAN: 1. NSTEMI: Troponins up to 1.99. On heparin. Lungs clear today. Will start low-dose Toprol-XL 12.5 mg daily. Continue ASA and low-dose lisinopril. Will add Lipitor 80 mg daily (normal transaminases) with markedly elevated LDL of 300. Plan for right and left heart catheterization with coronary angiography on 3/7. 2. Acute systolic heart failure, EF 40-45%: Lungs clear today and patient symptomatically improved. Continue digoxin, IV Lasix and spironolactone. Will start low-dose Toprol-XL 12.5 mg daily. Normal TSH. HbA1C pending. 3. Hyperlipidemia: LDL markedly elevated at 300. Will start high-intensity statin therapy with Lipitor 80 mg daily.   Prentice DockerSuresh Koneswaran, M.D., F.A.C.C.

## 2014-07-11 NOTE — Progress Notes (Signed)
ANTICOAGULATION CONSULT NOTE - Follow Up Consult  Pharmacy Consult for Heparin  Indication: chest pain/ACS  No Known Allergies  Patient Measurements: Height: 4\' 11"  (149.9 cm) Weight: 108 lb 4.8 oz (49.125 kg) IBW/kg (Calculated) : 43.2  Vital Signs: Temp: 98.1 F (36.7 C) (03/05 0620) Temp Source: Oral (03/05 0620) BP: 126/69 mmHg (03/05 0620) Pulse Rate: 94 (03/05 0620)  Labs:  Recent Labs  07/10/14 1315 07/10/14 1655 07/10/14 2211 07/11/14 0043 07/11/14 0425 07/11/14 0627  HGB 11.2*  --   --   --   --   --   HCT 34.6*  --   --   --   --   --   PLT 357  --   --   --   --   --   LABPROT 14.4  --   --   --   --   --   INR 1.11  --   --   --   --   --   HEPARINUNFRC  --   --   --  0.52  --  0.40  CREATININE 0.75  --   --   --  0.85  --   TROPONINI  --  1.52* 1.97*  --  1.99*  --     Estimated Creatinine Clearance: 46.8 mL/min (by C-G formula based on Cr of 0.85).   Assessment: 64 yo who was admitted for symptoms of HF. Her troponin is trending up so IV heparin has been ordered for anticoagulation. Heparin continues to be at goal on 750 units/hr without complications. Will continue at current rate and recheck cbc/HL in am.  Goal of Therapy:  Heparin level 0.3-0.7 units/ml Monitor platelets by anticoagulation protocol: Yes   Plan:  -Continue heparin at 750 units/hr -Daily CBC/HL -Monitor for bleeding  Sheppard CoilFrank Wilson PharmD., BCPS Clinical Pharmacist Pager [phone] 07/11/2014 7:17 AM

## 2014-07-11 NOTE — Progress Notes (Signed)
ANTICOAGULATION CONSULT NOTE - Follow Up Consult  Pharmacy Consult for Heparin  Indication: chest pain/ACS  No Known Allergies  Patient Measurements: Height: 4\' 11"  (149.9 cm) Weight: 109 lb 4.8 oz (49.578 kg) (scale b) IBW/kg (Calculated) : 43.2  Vital Signs: Temp: 98.1 F (36.7 C) (03/04 2017) Temp Source: Oral (03/04 2017) BP: 121/64 mmHg (03/04 2017) Pulse Rate: 97 (03/04 2017)  Labs:  Recent Labs  07/10/14 1315 07/10/14 1655 07/10/14 2211 07/11/14 0043  HGB 11.2*  --   --   --   HCT 34.6*  --   --   --   PLT 357  --   --   --   LABPROT 14.4  --   --   --   INR 1.11  --   --   --   HEPARINUNFRC  --   --   --  0.52  CREATININE 0.75  --   --   --   TROPONINI  --  1.52* 1.97*  --     Estimated Creatinine Clearance: 49.7 mL/min (by C-G formula based on Cr of 0.75).   Assessment: Therapeutic heparin level x 1  Goal of Therapy:  Heparin level 0.3-0.7 units/ml Monitor platelets by anticoagulation protocol: Yes   Plan:  -Continue heparin at 750 units/hr -0700 HL -Daily CBC/HL -Monitor for bleeding  Abran DukeLedford, Nasiyah Laverdiere 07/11/2014,1:15 AM

## 2014-07-12 LAB — BASIC METABOLIC PANEL
ANION GAP: 5 (ref 5–15)
BUN: 23 mg/dL (ref 6–23)
CO2: 28 mmol/L (ref 19–32)
Calcium: 8.5 mg/dL (ref 8.4–10.5)
Chloride: 105 mmol/L (ref 96–112)
Creatinine, Ser: 0.82 mg/dL (ref 0.50–1.10)
GFR calc Af Amer: 87 mL/min — ABNORMAL LOW (ref >90)
GFR, EST NON AFRICAN AMERICAN: 75 mL/min — AB (ref >90)
Glucose, Bld: 125 mg/dL — ABNORMAL HIGH (ref 70–99)
Potassium: 3.6 mmol/L (ref 3.5–5.1)
SODIUM: 138 mmol/L (ref 135–145)

## 2014-07-12 LAB — CBC
HEMATOCRIT: 30.9 % — AB (ref 36.0–46.0)
Hemoglobin: 9.9 g/dL — ABNORMAL LOW (ref 12.0–15.0)
MCH: 25.4 pg — AB (ref 26.0–34.0)
MCHC: 32 g/dL (ref 30.0–36.0)
MCV: 79.2 fL (ref 78.0–100.0)
Platelets: 335 10*3/uL (ref 150–400)
RBC: 3.9 MIL/uL (ref 3.87–5.11)
RDW: 15.5 % (ref 11.5–15.5)
WBC: 11.9 10*3/uL — ABNORMAL HIGH (ref 4.0–10.5)

## 2014-07-12 LAB — HEPARIN LEVEL (UNFRACTIONATED): HEPARIN UNFRACTIONATED: 0.23 [IU]/mL — AB (ref 0.30–0.70)

## 2014-07-12 MED ORDER — METOPROLOL SUCCINATE ER 25 MG PO TB24
25.0000 mg | ORAL_TABLET | Freq: Every day | ORAL | Status: DC
  Administered 2014-07-13: 25 mg via ORAL
  Filled 2014-07-12 (×2): qty 1

## 2014-07-12 MED ORDER — SODIUM CHLORIDE 0.9 % IV SOLN
INTRAVENOUS | Status: DC
  Administered 2014-07-13: via INTRAVENOUS

## 2014-07-12 MED ORDER — SODIUM CHLORIDE 0.9 % IJ SOLN
3.0000 mL | Freq: Two times a day (BID) | INTRAMUSCULAR | Status: DC
  Administered 2014-07-12: 3 mL via INTRAVENOUS

## 2014-07-12 MED ORDER — SODIUM CHLORIDE 0.9 % IJ SOLN: 3.0000 mL | INTRAMUSCULAR | Status: DC | PRN

## 2014-07-12 MED ORDER — ASPIRIN 81 MG PO CHEW
81.0000 mg | CHEWABLE_TABLET | ORAL | Status: AC
  Administered 2014-07-13: 81 mg via ORAL
  Filled 2014-07-12: qty 1

## 2014-07-12 MED ORDER — SODIUM CHLORIDE 0.9 % IV SOLN
250.0000 mL | INTRAVENOUS | Status: DC | PRN
Start: 2014-07-12 — End: 2014-07-13

## 2014-07-12 NOTE — Progress Notes (Signed)
ANTICOAGULATION CONSULT NOTE Pharmacy Consult for Heparin  Indication: chest pain/ACS  No Known Allergies  Patient Measurements: Height: 4\' 11"  (149.9 cm) Weight: 108 lb 4.8 oz (49.125 kg) IBW/kg (Calculated) : 43.2  Vital Signs: Temp: 97.9 F (36.6 C) (03/05 2025) Temp Source: Oral (03/05 2025) BP: 112/49 mmHg (03/05 2025) Pulse Rate: 81 (03/05 2025)  Labs:  Recent Labs  07/10/14 1315 07/10/14 1655 07/10/14 2211 07/11/14 0043 07/11/14 0425 07/11/14 0627 07/12/14 0410  HGB 11.2*  --   --   --   --   --  9.9*  HCT 34.6*  --   --   --   --   --  30.9*  PLT 357  --   --   --   --   --  335  LABPROT 14.4  --   --   --   --   --   --   INR 1.11  --   --   --   --   --   --   HEPARINUNFRC  --   --   --  0.52  --  0.40 0.23*  CREATININE 0.75  --   --   --  0.85  --   --   TROPONINI  --  1.52* 1.97*  --  1.99*  --   --     Estimated Creatinine Clearance: 46.8 mL/min (by C-G formula based on Cr of 0.85).  Assessment: 64 yo female with NSTEMI for heparin  Goal of Therapy:  Heparin level 0.3-0.7 units/ml Monitor platelets by anticoagulation protocol: Yes   Plan:  Increase Heparin 850 units/hr  Geannie RisenGreg Kataleya Zaugg, PharmD, BCPS  07/12/2014 5:34 AM

## 2014-07-12 NOTE — Progress Notes (Signed)
Subjective: No pain, some SOB, + productive cough this AM clear  Objective: Vital signs in last 24 hours: Temp:  [97.8 F (36.6 C)-98.4 F (36.9 C)] 98.4 F (36.9 C) (03/06 0959) Pulse Rate:  [80-88] 88 (03/06 0959) Resp:  [18-20] 18 (03/06 0959) BP: (112-116)/(47-64) 114/64 mmHg (03/06 0959) SpO2:  [98 %-99 %] 98 % (03/06 0959) Weight:  [108 lb (48.988 kg)] 108 lb (48.988 kg) (03/06 0629) Weight change: -4 lb 6.4 oz (-1.996 kg) Last BM Date: 07/11/14 Intake/Output from previous day: -126 (since admit -502) wt down from 112 to 108 03/05 0701 - 03/06 0700 In: 1175 [P.O.:990; I.V.:185] Out: 1301 [Urine:1300; Stool:1] Intake/Output this shift: Total I/O In: 290 [P.O.:290] Out: 0   PE: General:Pleasant affect, NAD Skin:Warm and dry, brisk capillary refill HEENT:normocephalic, sclera clear, mucus membranes moist Neck:supple, + JVD   Heart:S1S2 RRR without murmur, gallup, rub or click Lungs:few crackles, rhonchi, or wheezes UJW:JXBJAbd:soft, non tender, + BS, do not palpate liver spleen or masses Ext:no lower ext edema, 2+ pedal pulses, 2+ radial pulses Neuro:alert and oriented X 3, MAE, follows commands, + facial symmetry  tele: SR Lab Results:  Recent Labs  07/10/14 1315 07/12/14 0410  WBC 11.9* 11.9*  HGB 11.2* 9.9*  HCT 34.6* 30.9*  PLT 357 335   BMET  Recent Labs  07/11/14 0425 07/12/14 0410  NA 140 138  K 3.6 3.6  CL 105 105  CO2 26 28  GLUCOSE 122* 125*  BUN 19 23  CREATININE 0.85 0.82  CALCIUM 8.7 8.5    Recent Labs  07/10/14 2211 07/11/14 0425  TROPONINI 1.97* 1.99*    Lab Results  Component Value Date   CHOL 347* 07/11/2014   HDL 27* 07/11/2014   LDLCALC 300* 07/11/2014   TRIG 100 07/11/2014   CHOLHDL 12.9 07/11/2014   No results found for: HGBA1C   Lab Results  Component Value Date   TSH 3.001 07/10/2014    Hepatic Function Panel  Recent Labs  07/10/14 1315  PROT 8.4*  ALBUMIN 3.7  AST 27  ALT 18  ALKPHOS 97    BILITOT 0.5    Recent Labs  07/11/14 0425  CHOL 347*   No results for input(s): PROTIME in the last 72 hours.     Studies/Results: Dg Chest 2 View  07/10/2014   CLINICAL DATA:  Cough, chest pressure.  EXAM: CHEST  2 VIEW  COMPARISON:  None.  FINDINGS: The heart size and mediastinal contours are within normal limits. No pneumothorax is noted. Minimal bilateral pleural effusions are noted. Bilateral perihilar and basilar interstitial opacities are noted most consistent with pulmonary edema . The visualized skeletal structures are unremarkable.  IMPRESSION: Findings consistent with bilateral pulmonary edema with minimal associated pleural effusions.   Electronically Signed   By: Lupita RaiderJames  Green Jr, M.D.   On: 07/10/2014 11:58    Medications: I have reviewed the patient's current medications. Scheduled Meds: . aspirin  81 mg Oral Daily  . atorvastatin  80 mg Oral q1800  . digoxin  0.125 mg Oral Daily  . furosemide  40 mg Intravenous Daily  . lisinopril  2.5 mg Oral Daily  . metoprolol succinate  12.5 mg Oral Daily  . multivitamin with minerals  1 tablet Oral Daily  . sodium chloride  3 mL Intravenous Q12H  . spironolactone  12.5 mg Oral Daily   Continuous Infusions: . heparin 850 Units/hr (07/12/14 0539)   PRN Meds:.sodium chloride, acetaminophen, ondansetron (  ZOFRAN) IV, sodium chloride  Assessment/Plan: 1. NSTEMI: Troponins peaked at 1.99. On heparin. Lungs clear today. Will continue Toprol-XL but increase to 25 mg daily. Continue ASA and low-dose lisinopril. Continue Lipitor 80 mg daily (normal transaminases) with markedly elevated LDL of 300. Plan for right and left heart catheterization with coronary angiography on 3/7.  2. Acute systolic heart failure, EF 40-45%: Lungs clear today and patient symptomatically improved. Continue digoxin, IV Lasix, Toprol-XL (increase to 25 mg) and spironolactone. Normal TSH. HbA1C pending.  3. Hyperlipidemia: LDL markedly elevated at 300. Will  continue high-intensity statin therapy with Lipitor 80 mg daily.    LOS: 2 days

## 2014-07-13 ENCOUNTER — Encounter (HOSPITAL_COMMUNITY): Payer: Self-pay | Admitting: Interventional Cardiology

## 2014-07-13 ENCOUNTER — Encounter (HOSPITAL_COMMUNITY)
Admission: EM | Disposition: A | Discharge status: Home / Self Care | Payer: Self-pay | Source: Home / Self Care | Attending: Internal Medicine

## 2014-07-13 ENCOUNTER — Inpatient Hospital Stay (HOSPITAL_COMMUNITY): Payer: Managed Care, Other (non HMO)

## 2014-07-13 ENCOUNTER — Other Ambulatory Visit: Payer: Self-pay | Admitting: *Deleted

## 2014-07-13 DIAGNOSIS — I5022 Chronic systolic (congestive) heart failure: Secondary | ICD-10-CM | POA: Insufficient documentation

## 2014-07-13 DIAGNOSIS — I509 Heart failure, unspecified: Secondary | ICD-10-CM

## 2014-07-13 DIAGNOSIS — I2510 Atherosclerotic heart disease of native coronary artery without angina pectoris: Secondary | ICD-10-CM

## 2014-07-13 DIAGNOSIS — I214 Non-ST elevation (NSTEMI) myocardial infarction: Secondary | ICD-10-CM | POA: Insufficient documentation

## 2014-07-13 HISTORY — PX: LEFT HEART CATHETERIZATION WITH CORONARY ANGIOGRAM: SHX5451

## 2014-07-13 LAB — POCT I-STAT 3, VENOUS BLOOD GAS (G3P V)
Acid-Base Excess: 1 mmol/L (ref 0.0–2.0)
Bicarbonate: 25.8 mEq/L — ABNORMAL HIGH (ref 20.0–24.0)
O2 Saturation: 63 %
TCO2: 27 mmol/L (ref 0–100)
pCO2, Ven: 39.9 mmHg — ABNORMAL LOW (ref 45.0–50.0)
pH, Ven: 7.418 — ABNORMAL HIGH (ref 7.250–7.300)
pO2, Ven: 32 mmHg (ref 30.0–45.0)

## 2014-07-13 LAB — HEMOGLOBIN A1C
Hgb A1c MFr Bld: 6.1 % — ABNORMAL HIGH (ref 4.8–5.6)
Mean Plasma Glucose: 128 mg/dL

## 2014-07-13 LAB — POCT I-STAT 3, ART BLOOD GAS (G3+)
Acid-base deficit: 1 mmol/L (ref 0.0–2.0)
Bicarbonate: 22.5 mEq/L (ref 20.0–24.0)
O2 SAT: 93 %
PCO2 ART: 33.2 mmHg — AB (ref 35.0–45.0)
PO2 ART: 63 mmHg — AB (ref 80.0–100.0)
TCO2: 24 mmol/L (ref 0–100)
pH, Arterial: 7.44 (ref 7.350–7.450)

## 2014-07-13 LAB — PULMONARY FUNCTION TEST
DL/VA % pred: 115 %
DL/VA: 4.71 ml/min/mmHg/L
DLCO cor % pred: 25 %
DLCO cor: 4.53 ml/min/mmHg
DLCO unc % pred: 22 %
DLCO unc: 4.01 ml/min/mmHg
FEF 25-75 Post: 1.88 L/sec
FEF 25-75 Pre: 1.72 L/sec
FEF2575-%Change-Post: 9 %
FEF2575-%Pred-Post: 94 %
FEF2575-%Pred-Pre: 86 %
FEV1-%Change-Post: 9 %
FEV1-%Pred-Post: 58 %
FEV1-%Pred-Pre: 53 %
FEV1-Post: 1.2 L
FEV1-Pre: 1.1 L
FEV1FVC-%Change-Post: 1 %
FEV1FVC-%Pred-Pre: 114 %
FEV6-%Change-Post: 7 %
FEV6-%Pred-Post: 52 %
FEV6-%Pred-Pre: 48 %
FEV6-Post: 1.33 L
FEV6-Pre: 1.23 L
FEV6FVC-%Pred-Post: 104 %
FEV6FVC-%Pred-Pre: 104 %
FVC-%Change-Post: 7 %
FVC-%Pred-Post: 49 %
FVC-%Pred-Pre: 46 %
FVC-Post: 1.33 L
FVC-Pre: 1.23 L
Post FEV1/FVC ratio: 90 %
Post FEV6/FVC ratio: 100 %
Pre FEV1/FVC ratio: 89 %
Pre FEV6/FVC Ratio: 100 %
RV % pred: 78 %
RV: 1.39 L
TLC % pred: 67 %
TLC: 2.91 L

## 2014-07-13 LAB — BASIC METABOLIC PANEL
Anion gap: 5 (ref 5–15)
BUN: 20 mg/dL (ref 6–23)
CO2: 28 mmol/L (ref 19–32)
CREATININE: 0.79 mg/dL (ref 0.50–1.10)
Calcium: 8.7 mg/dL (ref 8.4–10.5)
Chloride: 106 mmol/L (ref 96–112)
GFR, EST NON AFRICAN AMERICAN: 87 mL/min — AB (ref >90)
GLUCOSE: 116 mg/dL — AB (ref 70–99)
POTASSIUM: 4.1 mmol/L (ref 3.5–5.1)
Sodium: 139 mmol/L (ref 135–145)

## 2014-07-13 LAB — CBC
HCT: 32 % — ABNORMAL LOW (ref 36.0–46.0)
HEMOGLOBIN: 10.2 g/dL — AB (ref 12.0–15.0)
MCH: 25.1 pg — AB (ref 26.0–34.0)
MCHC: 31.9 g/dL (ref 30.0–36.0)
MCV: 78.6 fL (ref 78.0–100.0)
Platelets: 378 10*3/uL (ref 150–400)
RBC: 4.07 MIL/uL (ref 3.87–5.11)
RDW: 15.2 % (ref 11.5–15.5)
WBC: 12.2 10*3/uL — ABNORMAL HIGH (ref 4.0–10.5)

## 2014-07-13 LAB — HEPARIN LEVEL (UNFRACTIONATED)
Heparin Unfractionated: 0.25 IU/mL — ABNORMAL LOW (ref 0.30–0.70)
Heparin Unfractionated: 0.11 IU/mL — ABNORMAL LOW (ref 0.30–0.70)

## 2014-07-13 LAB — PROTIME-INR
INR: 1.06 (ref 0.00–1.49)
Prothrombin Time: 14 seconds (ref 11.6–15.2)

## 2014-07-13 SURGERY — LEFT HEART CATHETERIZATION WITH CORONARY ANGIOGRAM
Anesthesia: LOCAL

## 2014-07-13 MED ORDER — ALBUTEROL SULFATE (2.5 MG/3ML) 0.083% IN NEBU
2.5000 mg | INHALATION_SOLUTION | Freq: Once | RESPIRATORY_TRACT | Status: AC
  Administered 2014-07-13: 2.5 mg via RESPIRATORY_TRACT

## 2014-07-13 MED ORDER — LIDOCAINE HCL (PF) 1 % IJ SOLN
INTRAMUSCULAR | Status: AC
  Filled 2014-07-13: qty 30

## 2014-07-13 MED ORDER — NITROGLYCERIN 1 MG/10 ML FOR IR/CATH LAB
INTRA_ARTERIAL | Status: AC
  Filled 2014-07-13: qty 10

## 2014-07-13 MED ORDER — MIDAZOLAM HCL 2 MG/2ML IJ SOLN
INTRAMUSCULAR | Status: AC
  Filled 2014-07-13: qty 2

## 2014-07-13 MED ORDER — VERAPAMIL HCL 2.5 MG/ML IV SOLN
INTRAVENOUS | Status: AC
Start: 2014-07-13 — End: 2014-07-13
  Filled 2014-07-13: qty 2

## 2014-07-13 MED ORDER — HEPARIN (PORCINE) IN NACL 2-0.9 UNIT/ML-% IJ SOLN
INTRAMUSCULAR | Status: AC
  Filled 2014-07-13: qty 1500

## 2014-07-13 MED ORDER — ONDANSETRON HCL 4 MG/2ML IJ SOLN: 4.0000 mg | Freq: Four times a day (QID) | INTRAMUSCULAR | Status: DC | PRN

## 2014-07-13 MED ORDER — FENTANYL CITRATE 0.05 MG/ML IJ SOLN
INTRAMUSCULAR | Status: AC
  Filled 2014-07-13: qty 2

## 2014-07-13 MED ORDER — SODIUM CHLORIDE 0.9 % IV SOLN
1.0000 mL/kg/h | INTRAVENOUS | Status: AC
Start: 2014-07-13 — End: 2014-07-13

## 2014-07-13 MED ORDER — HEPARIN SODIUM (PORCINE) 1000 UNIT/ML IJ SOLN
INTRAMUSCULAR | Status: AC
  Filled 2014-07-13: qty 1

## 2014-07-13 MED ORDER — HEPARIN (PORCINE) IN NACL 100-0.45 UNIT/ML-% IJ SOLN
950.0000 [IU]/h | INTRAMUSCULAR | Status: DC
  Administered 2014-07-13: 850 [IU]/h via INTRAVENOUS
  Filled 2014-07-13 (×2): qty 250

## 2014-07-13 MED ORDER — ASPIRIN 81 MG PO CHEW
81.0000 mg | CHEWABLE_TABLET | Freq: Every day | ORAL | Status: DC
  Administered 2014-07-14 – 2014-07-15 (×2): 81 mg via ORAL
  Filled 2014-07-13 (×2): qty 1

## 2014-07-13 MED ORDER — ACETAMINOPHEN 325 MG PO TABS
650.0000 mg | ORAL_TABLET | ORAL | Status: DC | PRN
  Administered 2014-07-14: 325 mg via ORAL
  Filled 2014-07-13: qty 2

## 2014-07-13 NOTE — Progress Notes (Signed)
UR COMPLETED  

## 2014-07-13 NOTE — Progress Notes (Signed)
Heparin gtt ordered to start back at 1230.  Pt had cath this AM at 0830.  Order clarified with MD.  MD ordered four orders after 0830 and says it is ok for RN to start at 1230, the chance of bleeding is minimal with TR band.  Will carry out orders and continue to monitor.

## 2014-07-13 NOTE — Progress Notes (Signed)
Patient's oral temperature 99.7.  Patient denies any symptoms of fever, states she does not feel she needs a PRN for fever at this time.  Will recheck temperature with vital signs every 4 hours and continue to monitor.

## 2014-07-13 NOTE — CV Procedure (Addendum)
       PROCEDURE:  Right and Left heart catheterization with selective coronary angiography.  INDICATIONS:  Non-STEMI, Acute CHF.  The risks, benefits, and details of the procedure were explained to the patient.  The patient verbalized understanding and wanted to proceed.  Informed written consent was obtained.  PROCEDURE TECHNIQUE:  After Xylocaine anesthesia, a 5 French brachial sheath was exchanged for a right antecubital IV. After Xylocaine anesthesia a 66F slender sheath was placed in the right radial artery with a single anterior needle wall stick.  A 5 French balloontipped Swan-Ganz catheter was advanced from the right brachial area to the pulmonary artery. Hemovac pressure assessment was performed. Oxygen saturations were performed. IV Heparin was given.  Right coronary angiography was done using a Judkins R4 guide catheter.  Left coronary angiography was done using a Judkins L3.5 guide catheter.  Left the JL3.5 was done using a pigtail catheter.  A TR band was used for hemostasis.   CONTRAST:  Total of 40 cc.  COMPLICATIONS:  None.    HEMODYNAMICS:  Aortic pressure was 108/53; LV pressure was 108/3; LVEDP 8.  There was no gradient between the left ventricle and aorta.  Right atrial pressure 2/1; right ventricular pressure 19/0; pulmonary artery pressure 21/6, mean PA pressure 12 mmHg; pulmonic capillary wedge pressure 2/ 2.  Pulmonary artery saturation 63%, aortic saturation 93%. Cardiac output 4.5 L/m. Cardiac index 3.2.  ANGIOGRAPHIC DATA:   The left main coronary artery is widely patent.  The left anterior descending artery is a large vessel which reaches the apex. In the mid vessel, there are 2 large diagonal vessels which originate from the LAD. The more proximal diagonal vessel has a 95% ostial stenosis. Right at that origin in the mid LAD, there is 95% stenosis. There is a large diagonal vessel originating at the distal part of the LAD stenosis.  Further down in the mid LAD, there  is an area of 90% stenosis. The apical LAD has mild disease.  The left circumflex artery is a large vessel proximally. In the mid vessel, the size of the vessel decreased significantly. There is a severe stenosis and TIMI 2 flow into the remainder of the circumflex and OM. There is a medium size ramus vessel which is patent.  The right coronary artery is a large, dominant vessel. In the mid vessel, there is a focal 80% stenosis. The posterior lateral artery is large. There appear to be some collaterals to the obtuse marginal territory. The posterior descending artery is large as well and appears widely patent.  LEFT VENTRICULOGRAM:  Left ventricular angiogram was not done.  LVEDP was 8 mmHg.  IMPRESSIONS:  1. Normal left main coronary artery. 2. Severe disease in the mid left anterior descending artery and its diagonal branches which originate at the LAD stenosis. 3. Severe disease in the mid left circumflex artery with restricted flow into the distal circumflex and OM branches. 4. Severe disease in the mid right coronary artery with a widely patent distal RCA system. There is evidence of right-to-left collaterals. 5. Left ventricular systolic function not assessed.  LVEDP 8 mmHg.   6.   Normal right heart pressures.   Pulmonary artery saturation 63%, aortic saturation 93%. Cardiac output 4.5 L/m. Cardiac index 3.2.   RECOMMENDATION:  Plan for cardiac surgery consultation for bypass surgery given her multivessel disease involving bifurcations.  She'll need aggressive secondary prevention.

## 2014-07-13 NOTE — Progress Notes (Signed)
ANTICOAGULATION CONSULT NOTE Pharmacy Consult for Heparin  Indication: chest pain/ACS  No Known Allergies  Patient Measurements: Height: 4\' 11"  (149.9 cm) Weight: 106 lb 12 oz (48.42 kg) (Scale B) IBW/kg (Calculated) : 43.2  Vital Signs: Temp: 98.3 F (36.8 C) (03/07 1533) Temp Source: Oral (03/07 1533) BP: 131/67 mmHg (03/07 1533) Pulse Rate: 99 (03/07 1533)  Labs:  Recent Labs  07/10/14 2211  07/11/14 0425  07/12/14 0410 07/13/14 0535 07/13/14 1929  HGB  --   --   --   --  9.9* 10.2*  --   HCT  --   --   --   --  30.9* 32.0*  --   PLT  --   --   --   --  335 378  --   LABPROT  --   --   --   --   --  14.0  --   INR  --   --   --   --   --  1.06  --   HEPARINUNFRC  --   < >  --   < > 0.23* 0.25* 0.11*  CREATININE  --   --  0.85  --  0.82 0.79  --   TROPONINI 1.97*  --  1.99*  --   --   --   --   < > = values in this interval not displayed.  Estimated Creatinine Clearance: 49.7 mL/min (by C-G formula based on Cr of 0.79).  Assessment: 64 yo female with NSTEMI for heparin. 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 Cardiac surgery will be consulted. Heparin restarted this afternoon at previous rate.  Coag/Heme: ACS, heparin level reported as less than goal.  No problems with infusion or bleeding noted per RN Irving BurtonEmily.    Goal of Therapy:  Heparin level 0.3-0.7 units/ml Monitor platelets by anticoagulation protocol: Yes   Plan:  Increase  Heparin to 950 units/hr Check 6 hour HL, am labs  Thank you for allowing pharmacy to be a part of this patients care team.  Lovenia KimJulie Sharronda Schweers Pharm.D., BCPS, AQ-Cardiology Clinical Pharmacist 07/13/2014 8:48 PM Pager: [phone] Phone: [phone]

## 2014-07-13 NOTE — H&P (View-Only) (Signed)
       Subjective: No pain, some SOB, + productive cough this AM clear  Objective: Vital signs in last 24 hours: Temp:  [97.8 F (36.6 C)-98.4 F (36.9 C)] 98.4 F (36.9 C) (03/06 0959) Pulse Rate:  [80-88] 88 (03/06 0959) Resp:  [18-20] 18 (03/06 0959) BP: (112-116)/(47-64) 114/64 mmHg (03/06 0959) SpO2:  [98 %-99 %] 98 % (03/06 0959) Weight:  [108 lb (48.988 kg)] 108 lb (48.988 kg) (03/06 0629) Weight change: -4 lb 6.4 oz (-1.996 kg) Last BM Date: 07/11/14 Intake/Output from previous day: -126 (since admit -502) wt down from 112 to 108 03/05 0701 - 03/06 0700 In: 1175 [P.O.:990; I.V.:185] Out: 1301 [Urine:1300; Stool:1] Intake/Output this shift: Total I/O In: 290 [P.O.:290] Out: 0   PE: General:Pleasant affect, NAD Skin:Warm and dry, brisk capillary refill HEENT:normocephalic, sclera clear, mucus membranes moist Neck:supple, + JVD   Heart:S1S2 RRR without murmur, gallup, rub or click Lungs:few crackles, rhonchi, or wheezes Abd:soft, non tender, + BS, do not palpate liver spleen or masses Ext:no lower ext edema, 2+ pedal pulses, 2+ radial pulses Neuro:alert and oriented X 3, MAE, follows commands, + facial symmetry  tele: SR Lab Results:  Recent Labs  07/10/14 1315 07/12/14 0410  WBC 11.9* 11.9*  HGB 11.2* 9.9*  HCT 34.6* 30.9*  PLT 357 335   BMET  Recent Labs  07/11/14 0425 07/12/14 0410  NA 140 138  K 3.6 3.6  CL 105 105  CO2 26 28  GLUCOSE 122* 125*  BUN 19 23  CREATININE 0.85 0.82  CALCIUM 8.7 8.5    Recent Labs  07/10/14 2211 07/11/14 0425  TROPONINI 1.97* 1.99*    Lab Results  Component Value Date   CHOL 347* 07/11/2014   HDL 27* 07/11/2014   LDLCALC 300* 07/11/2014   TRIG 100 07/11/2014   CHOLHDL 12.9 07/11/2014   No results found for: HGBA1C   Lab Results  Component Value Date   TSH 3.001 07/10/2014    Hepatic Function Panel  Recent Labs  07/10/14 1315  PROT 8.4*  ALBUMIN 3.7  AST 27  ALT 18  ALKPHOS 97    BILITOT 0.5    Recent Labs  07/11/14 0425  CHOL 347*   No results for input(s): PROTIME in the last 72 hours.     Studies/Results: Dg Chest 2 View  07/10/2014   CLINICAL DATA:  Cough, chest pressure.  EXAM: CHEST  2 VIEW  COMPARISON:  None.  FINDINGS: The heart size and mediastinal contours are within normal limits. No pneumothorax is noted. Minimal bilateral pleural effusions are noted. Bilateral perihilar and basilar interstitial opacities are noted most consistent with pulmonary edema . The visualized skeletal structures are unremarkable.  IMPRESSION: Findings consistent with bilateral pulmonary edema with minimal associated pleural effusions.   Electronically Signed   By: James  Green Jr, M.D.   On: 07/10/2014 11:58    Medications: I have reviewed the patient's current medications. Scheduled Meds: . aspirin  81 mg Oral Daily  . atorvastatin  80 mg Oral q1800  . digoxin  0.125 mg Oral Daily  . furosemide  40 mg Intravenous Daily  . lisinopril  2.5 mg Oral Daily  . metoprolol succinate  12.5 mg Oral Daily  . multivitamin with minerals  1 tablet Oral Daily  . sodium chloride  3 mL Intravenous Q12H  . spironolactone  12.5 mg Oral Daily   Continuous Infusions: . heparin 850 Units/hr (07/12/14 0539)   PRN Meds:.sodium chloride, acetaminophen, ondansetron (  ZOFRAN) IV, sodium chloride  Assessment/Plan: 1. NSTEMI: Troponins peaked at 1.99. On heparin. Lungs clear today. Will continue Toprol-XL but increase to 25 mg daily. Continue ASA and low-dose lisinopril. Continue Lipitor 80 mg daily (normal transaminases) with markedly elevated LDL of 300. Plan for right and left heart catheterization with coronary angiography on 3/7.  2. Acute systolic heart failure, EF 40-45%: Lungs clear today and patient symptomatically improved. Continue digoxin, IV Lasix, Toprol-XL (increase to 25 mg) and spironolactone. Normal TSH. HbA1C pending.  3. Hyperlipidemia: LDL markedly elevated at 300. Will  continue high-intensity statin therapy with Lipitor 80 mg daily.    LOS: 2 days     

## 2014-07-13 NOTE — Interval H&P Note (Signed)
Cath Lab Visit (complete for each Cath Lab visit)  Clinical Evaluation Leading to the Procedure:   ACS: Yes.    Non-ACS:    Anginal Classification: CCS IV  Anti-ischemic medical therapy: Minimal Therapy (1 class of medications)  Non-Invasive Test Results: No non-invasive testing performed  Prior CABG: No previous CABG   TIMI Score  Patient Information:  TIMI Score is 2  UA/NSTEMI and low-risk features (e.g., TIMI score  Revascularization of the presumed culprit artery   U (6)  Indication: 9; Score: 6    History and Physical Interval Note:  07/13/2014 7:42 AM  Breanna Alvarez  has presented today for surgery, with the diagnosis of Chest Pain  The various methods of treatment have been discussed with the patient and family. After consideration of risks, benefits and other options for treatment, the patient has consented to  Procedure(s): LEFT HEART CATHETERIZATION WITH CORONARY ANGIOGRAM (N/A) as a surgical intervention .  The patient's history has been reviewed, patient examined, no change in status, stable for surgery.  I have reviewed the patient's chart and labs.  Questions were answered to the patient's satisfaction.     Haeli Gerlich S.

## 2014-07-13 NOTE — Progress Notes (Signed)
ANTICOAGULATION CONSULT NOTE Pharmacy Consult for Heparin  Indication: chest pain/ACS  No Known Allergies  Patient Measurements: Height: 4\' 11"  (149.9 cm) Weight: 106 lb 12 oz (48.42 kg) (Scale B) IBW/kg (Calculated) : 43.2  Vital Signs: Temp: 98.3 F (36.8 C) (03/07 0543) Temp Source: Oral (03/07 0543) BP: 119/52 mmHg (03/07 1110) Pulse Rate: 80 (03/07 1110)  Labs:  Recent Labs  07/10/14 1655 07/10/14 2211  07/11/14 0425 07/11/14 0627 07/12/14 0410 07/13/14 0535  HGB  --   --   --   --   --  9.9* 10.2*  HCT  --   --   --   --   --  30.9* 32.0*  PLT  --   --   --   --   --  335 378  LABPROT  --   --   --   --   --   --  14.0  INR  --   --   --   --   --   --  1.06  HEPARINUNFRC  --   --   < >  --  0.40 0.23* 0.25*  CREATININE  --   --   --  0.85  --  0.82 0.79  TROPONINI 1.52* 1.97*  --  1.99*  --   --   --   < > = values in this interval not displayed.  Estimated Creatinine Clearance: 49.7 mL/min (by C-G formula based on Cr of 0.79).  Assessment: 64 yo female with NSTEMI for heparin. 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 Cardiac surgery will be consulted. Heparin restarted this afternoon at previous rate.  Goal of Therapy:  Heparin level 0.3-0.7 units/ml Monitor platelets by anticoagulation protocol: Yes   Plan:  Restart Heparin 850 units/hr Check 6 hour HL  Sheppard CoilFrank Shyia Fillingim PharmD., BCPS Clinical Pharmacist Pager [phone] 07/13/2014 2:50 PM

## 2014-07-14 ENCOUNTER — Inpatient Hospital Stay (HOSPITAL_COMMUNITY): Payer: Managed Care, Other (non HMO)

## 2014-07-14 ENCOUNTER — Encounter (HOSPITAL_COMMUNITY): Payer: Self-pay | Admitting: *Deleted

## 2014-07-14 DIAGNOSIS — I2510 Atherosclerotic heart disease of native coronary artery without angina pectoris: Secondary | ICD-10-CM

## 2014-07-14 DIAGNOSIS — E785 Hyperlipidemia, unspecified: Secondary | ICD-10-CM

## 2014-07-14 DIAGNOSIS — D72829 Elevated white blood cell count, unspecified: Secondary | ICD-10-CM

## 2014-07-14 DIAGNOSIS — R942 Abnormal results of pulmonary function studies: Secondary | ICD-10-CM

## 2014-07-14 DIAGNOSIS — D649 Anemia, unspecified: Secondary | ICD-10-CM

## 2014-07-14 DIAGNOSIS — I340 Nonrheumatic mitral (valve) insufficiency: Secondary | ICD-10-CM

## 2014-07-14 LAB — BASIC METABOLIC PANEL
Anion gap: 9 (ref 5–15)
BUN: 11 mg/dL (ref 6–23)
CHLORIDE: 105 mmol/L (ref 96–112)
CO2: 26 mmol/L (ref 19–32)
CREATININE: 0.78 mg/dL (ref 0.50–1.10)
Calcium: 9.2 mg/dL (ref 8.4–10.5)
GFR calc non Af Amer: 88 mL/min — ABNORMAL LOW (ref >90)
Glucose, Bld: 116 mg/dL — ABNORMAL HIGH (ref 70–99)
Potassium: 4.3 mmol/L (ref 3.5–5.1)
Sodium: 140 mmol/L (ref 135–145)

## 2014-07-14 LAB — TSH: TSH: 3.572 u[IU]/mL (ref 0.350–4.500)

## 2014-07-14 LAB — HEPARIN LEVEL (UNFRACTIONATED): Heparin Unfractionated: 0.35 IU/mL (ref 0.30–0.70)

## 2014-07-14 LAB — SURGICAL PCR SCREEN
MRSA, PCR: NEGATIVE
Staphylococcus aureus: NEGATIVE

## 2014-07-14 LAB — CBC
HEMATOCRIT: 31 % — AB (ref 36.0–46.0)
Hemoglobin: 9.8 g/dL — ABNORMAL LOW (ref 12.0–15.0)
MCH: 24.8 pg — ABNORMAL LOW (ref 26.0–34.0)
MCHC: 31.6 g/dL (ref 30.0–36.0)
MCV: 78.5 fL (ref 78.0–100.0)
Platelets: 370 10*3/uL (ref 150–400)
RBC: 3.95 MIL/uL (ref 3.87–5.11)
RDW: 14.9 % (ref 11.5–15.5)
WBC: 12 10*3/uL — ABNORMAL HIGH (ref 4.0–10.5)

## 2014-07-14 LAB — OCCULT BLOOD X 1 CARD TO LAB, STOOL: Fecal Occult Bld: NEGATIVE

## 2014-07-14 MED ORDER — IOHEXOL 300 MG/ML  SOLN
75.0000 mL | Freq: Once | INTRAMUSCULAR | Status: AC | PRN
  Administered 2014-07-14: 75 mL via INTRAVENOUS

## 2014-07-14 MED ORDER — METOPROLOL SUCCINATE ER 50 MG PO TB24
50.0000 mg | ORAL_TABLET | Freq: Every day | ORAL | Status: DC
  Administered 2014-07-14 – 2014-07-15 (×2): 50 mg via ORAL
  Filled 2014-07-14 (×2): qty 1

## 2014-07-14 MED ORDER — ISOSORBIDE MONONITRATE ER 30 MG PO TB24
30.0000 mg | ORAL_TABLET | Freq: Every day | ORAL | Status: DC
Start: 2014-07-14 — End: 2014-07-15
  Administered 2014-07-14 – 2014-07-15 (×2): 30 mg via ORAL
  Filled 2014-07-14 (×2): qty 1

## 2014-07-14 MED ORDER — HEPARIN SODIUM (PORCINE) 5000 UNIT/ML IJ SOLN
5000.0000 [IU] | Freq: Three times a day (TID) | INTRAMUSCULAR | Status: DC
  Administered 2014-07-14 – 2014-07-15 (×3): 5000 [IU] via SUBCUTANEOUS
  Filled 2014-07-14 (×5): qty 1

## 2014-07-14 MED ORDER — LIVING BETTER WITH HEART FAILURE BOOK
Freq: Once | Status: AC
  Administered 2014-07-14: 09:00:00

## 2014-07-14 NOTE — Progress Notes (Signed)
1 Day Post-Op Procedure(s) (LRB): LEFT HEART CATHETERIZATION WITH CORONARY ANGIOGRAM (N/A) Subjective: Patient examined, coronary arteriograms, right heart cath data and echocardiogram reviewed. 64 year old nonsmoker presented with progressive increasing shortness of breath or several days and was found to have pulmonary edema with elevated cardiac enzymes. Echocardiogram showed moderate mitral regurgitation, probable ischemic. Cardiac catheterization demonstrates severe three-vessel CAD. Right heart cath data is fairly normal.  I discussed multivessel CABG with possible mitral valve repair-replacement with the patient. She wishes to pursue medical or percutaneous interventional therapy for her CAD.   Objective: Vital signs in last 24 hours: Temp:  [98.3 F (36.8 C)-99.7 F (37.6 C)] 98.5 F (36.9 C) (03/08 0541) Pulse Rate:  [80-99] 91 (03/08 0541) Cardiac Rhythm:  [-] Normal sinus rhythm (03/08 0818) Resp:  [16-18] 16 (03/08 0541) BP: (113-140)/(47-68) 121/57 mmHg (03/08 0541) SpO2:  [94 %-100 %] 97 % (03/08 0541) Weight:  [108 lb 1.6 oz (49.034 kg)] 108 lb 1.6 oz (49.034 kg) (03/08 0541)  Hemodynamic parameters for last 24 hours:    Intake/Output from previous day: 03/07 0701 - 03/08 0700 In: 1018 [P.O.:510; I.V.:508] Out: 1475 [Urine:1475] Intake/Output this shift: Total I/O In: -  Out: 350 [Urine:350]  Alert and comfortable Heart rhythm regular No murmur appreciated No peripheral edema Scattered rhonchi Neuro intact Abdomen soft No hematoma at cardiac catheterization site, no rashes  Lab Results:  Recent Labs  07/13/14 0535 07/14/14 0530  WBC 12.2* 12.0*  HGB 10.2* 9.8*  HCT 32.0* 31.0*  PLT 378 370   BMET:  Recent Labs  07/13/14 0535 07/14/14 0530  NA 139 140  K 4.1 4.3  CL 106 105  CO2 28 26  GLUCOSE 116* 116*  BUN 20 11  CREATININE 0.79 0.78  CALCIUM 8.7 9.2    PT/INR:  Recent Labs  07/13/14 0535  LABPROT 14.0  INR 1.06   ABG     Component Value Date/Time   PHART 7.440 07/13/2014 0800   HCO3 25.8* 07/13/2014 0801   TCO2 27 07/13/2014 0801   ACIDBASEDEF 1.0 07/13/2014 0800   O2SAT 63.0 07/13/2014 0801   CBG (last 3)  No results for input(s): GLUCAP in the last 72 hours.  Assessment/Plan: S/P Procedure(s) (LRB): LEFT HEART CATHETERIZATION WITH CORONARY ANGIOGRAM (N/A) We'll be available to discuss CABG again with patient if she wishes to consider surgical therapy.   LOS: 4 days    Kathlee Nationseter Van Trigt III 07/14/2014

## 2014-07-14 NOTE — Progress Notes (Signed)
CARDIAC REHAB PHASE I   PRE:  Rate/Rhythm: 82 SR  BP:  Supine:   Sitting: 138/56  Standing:    SaO2: 98 RA  MODE:  Ambulation: 760 ft   POST:  Rate/Rhythm: 99 SR  BP:  Supine:   Sitting: 142/60  Standing:    SaO2: 100 RA 1330-1500 Pt tolerated ambulation well without c/o of cp or SOB. VS stable Pt back to side of bed after walk with call light in reach. Completed MI and CHF education with pt and her husband. She voices understanding. Pt agrees to Outpt. CRP in [place] will lsend referral.We discussed CHF zones,daily weights, sodium and fluid restrictions, when to call MD and 911. She is very convinced that if she gets home and does all of the right things that she will be fine. She understands when she needs to notify MD or call 911. I encouraged pt to watch MI a way forward and CHF videos on TV. I left her instructions how to get them on TV.  Melina CopaLisa Kmari Halter RN 07/14/2014 3:00 PM

## 2014-07-14 NOTE — Progress Notes (Signed)
Patient: Breanna Alvarez / Admit Date: 07/10/2014 / Date of Encounter: 07/14/2014, 8:40 AM   Subjective: Feels well. No complaints. No CP or SOB.  Dr. Swaziland had long discussion with patient and her husband about recommendations for CABG over PCI, and the patient declines at this time.  Objective: Telemetry: NSR Physical Exam: Blood pressure 121/57, pulse 91, temperature 98.5 F (36.9 C), temperature source Oral, resp. rate 16, height  (1.499 m), weight 108 lb 1.6 oz (49.034 kg), SpO2 97 %. General: Well developed, well nourished [place] F in no acute distress. Head: Normocephalic, atraumatic, sclera non-icteric, no xanthomas, nares are without discharge. Neck: JVP not elevated. Lungs: Clear bilaterally to auscultation without wheezes, rales, or rhonchi. Breathing is unlabored. Heart: RRR S1 S2 without murmurs, rubs, or gallops.  Abdomen: Soft, non-tender, non-distended with normoactive bowel sounds. No rebound/guarding. Extremities: No clubbing or cyanosis. No edema. Distal pedal pulses are 2+ and equal bilaterally. Right radial cath site without hematoma/ecchymosis; good radial pulse. Neuro: Alert and oriented X 3. Moves all extremities spontaneously. Psych:  Responds to questions appropriately with a normal affect.   Intake/Output Summary (Last 24 hours) at 07/14/14 0840 Last data filed at 07/14/14 0708  Gross per 24 hour  Intake 1008.67 ml  Output   1825 ml  Net -816.33 ml    Inpatient Medications:  . aspirin  81 mg Oral Daily  . atorvastatin  80 mg Oral q1800  . digoxin  0.125 mg Oral Daily  . lisinopril  2.5 mg Oral Daily  . metoprolol succinate  25 mg Oral Daily  . multivitamin with minerals  1 tablet Oral Daily  . sodium chloride  3 mL Intravenous Q12H  . spironolactone  12.5 mg Oral Daily   Infusions:  . heparin 950 Units/hr (07/13/14 2052)    Labs:  Recent Labs  07/13/14 0535 07/14/14 0530  NA 139 140  K 4.1 4.3  CL 106 105  CO2 28 26  GLUCOSE 116*  116*  BUN 20 11  CREATININE 0.79 0.78  CALCIUM 8.7 9.2     Recent Labs  07/13/14 0535 07/14/14 0530  WBC 12.2* 12.0*  HGB 10.2* 9.8*  HCT 32.0* 31.0*  MCV 78.6 78.5  PLT 378 370   Radiology/Studies:  Dg Chest 2 View  07/10/2014   CLINICAL DATA:  Cough, chest pressure.  EXAM: CHEST  2 VIEW  COMPARISON:  None.  FINDINGS: The heart size and mediastinal contours are within normal limits. No pneumothorax is noted. Minimal bilateral pleural effusions are noted. Bilateral perihilar and basilar interstitial opacities are noted most consistent with pulmonary edema . The visualized skeletal structures are unremarkable.  IMPRESSION: Findings consistent with bilateral pulmonary edema with minimal associated pleural effusions.   Electronically Signed   By: Lupita Raider, M.D.   On: 07/10/2014 11:58     Assessment and Plan  63y/o [place] F with h/o HLD presented 3/4 with dyspnea in setting of new systolic HF (EF 40-45%) and NSTEMI with significant multivessel CAD on cath 3/7. TCTS consulted.  1. NSTEMI/multivessel CAD with moderate MR - Dr. Donata Clay discussed CABG + possible MV repair with the patient who expressed desire to pursue medical treatment or percutaneous interventional therapy for her CAD. Dr. Swaziland had long discussion with patient about high risk of PCI given complex lesions and risk of closing off side branches. He ultimately recommended CABG as well. After much discussion including risk of recurrent MI, worsening heart failure, or death, the patient wishes to  continue medical therapy only at this time, but cites that she will return to the hospital if she has any worsening symptoms. She is agreeable to close outpatient follow-up. - continue ASA, BB, statin - will titrate BB to 50mg  daily and add long acting nitrate - d/c heparin full dose and will ask pharmacy to change to DVT prophylaxis - also discussed consideration of addition of Plavix to regimen with Dr. SwazilandJordan, and given that  CABG is still possible we will hold off for now - ambulate today - possible discharge tomorrow if no recurrent angina  2. Acute systolic CHF EF 40-45% with elevated PA pressures and mod MR - continue ACEI, BB, spiro - it appears digoxin was started on admission when BB was unable to be used due to acute CHF - we will discontinue today - she appears euvolemic so may just need to send home on PRN oral Lasix at discharge  3. Marked hyperlipidemia LDL 300 - continue statin  6. Abnormal PFTs with severe interstitial restriction and severe diffusion defect concerning for interstitial inflammation or fibrosis - d-dimer abnormal on admission but not pursued as HF was felt likely the driver of her clinical scenario. She denies any recurrent CP, dyspnea and is not hypoxic. - Dr. Donata ClayVan Trigt has ordered a CT of the chest to evaluate for interstitial disease, which we will continue in the event that she does end up agreeing to CABG  4. Leukocytosis and low grade temperature elevation yesterday (99.7), now afebrile - may be due to MI - UA with trace leukocytes but asymptomatic, will send urine culture - chest CT pending  5. Normocytic anemia - slightly decreased since admission but remaining stable - will order hemoccult, patient aware to notify nurse of BM - patient denies any known bleeding or hx  Signed, Ronie Spiesayna Dunn PA-C Patient seen and examined and history reviewed. Agree with above findings and plan. I was asked to review her situation and cath films with the possibility of PCI with stents. She has multivessel and complex CAD with critical trifurcation LAD/diagonal 1 and 2 disease. She also has severe stenosis in the mid LAD and subtotal occlusion of the distal LCx. The RCA has a severe mid vessel lesion. She has at least moderate LV dysfunction with moderate MR (probably ischemic). She has been seen by Dr. Donata ClayVan Trigt for consideration of CABG but has declined at this time. I reviewed her anatomy  with her. I think she is at high risk for PCI especially in the LAD. I think there is a high risk of closure of one or both diagonal branches with risk of MI of up to 10%. She would require staged PCI to treat all her disease. I would favor CABG since this would result in a better chance for complete revascularization and recovery of LV function. The diagonal branches could be bypassed as well with a lower risk of MI. Given her low EF and high Syntax score CABG has the best data in this situation. After discussion the patient wants to try medical therapy first. She understands that she is at high risk for ACS, CHF, and sudden death. She states that if she doesn't do well on medical therapy she would be willing to do whatever we recommend including CABG. Will optimize her medical therapy with statin, ACEi, Coreg, and nitrates. Will need to ambulate in hall and if no further angina could potentially be DC tomorrow with close follow up.   Peter SwazilandJordan, MDFACC 07/14/2014 9:38 AM

## 2014-07-15 ENCOUNTER — Encounter (HOSPITAL_COMMUNITY): Payer: Self-pay | Admitting: Physician Assistant

## 2014-07-15 DIAGNOSIS — R942 Abnormal results of pulmonary function studies: Secondary | ICD-10-CM

## 2014-07-15 LAB — BASIC METABOLIC PANEL
Anion gap: 9 (ref 5–15)
BUN: 19 mg/dL (ref 6–23)
CO2: 26 mmol/L (ref 19–32)
CREATININE: 0.8 mg/dL (ref 0.50–1.10)
Calcium: 9.4 mg/dL (ref 8.4–10.5)
Chloride: 104 mmol/L (ref 96–112)
GFR calc Af Amer: 90 mL/min — ABNORMAL LOW (ref >90)
GFR, EST NON AFRICAN AMERICAN: 77 mL/min — AB (ref >90)
Glucose, Bld: 116 mg/dL — ABNORMAL HIGH (ref 70–99)
Potassium: 4.6 mmol/L (ref 3.5–5.1)
Sodium: 139 mmol/L (ref 135–145)

## 2014-07-15 MED ORDER — NITROGLYCERIN 0.4 MG SL SUBL: 0.4000 mg | SUBLINGUAL_TABLET | SUBLINGUAL | Status: DC | PRN

## 2014-07-15 MED ORDER — ATORVASTATIN CALCIUM 80 MG PO TABS: 80.0000 mg | ORAL_TABLET | Freq: Every day | ORAL | Status: DC

## 2014-07-15 MED ORDER — ACETAMINOPHEN 325 MG PO TABS: 650.0000 mg | ORAL_TABLET | ORAL | Status: DC | PRN

## 2014-07-15 MED ORDER — METOPROLOL SUCCINATE ER 50 MG PO TB24: 50.0000 mg | ORAL_TABLET | Freq: Every day | ORAL | Status: DC

## 2014-07-15 MED ORDER — LISINOPRIL 2.5 MG PO TABS: 2.5000 mg | ORAL_TABLET | Freq: Every day | ORAL | Status: DC

## 2014-07-15 MED ORDER — ISOSORBIDE MONONITRATE ER 30 MG PO TB24: 30.0000 mg | ORAL_TABLET | Freq: Every day | ORAL | Status: DC

## 2014-07-15 MED ORDER — ASPIRIN 81 MG PO CHEW: 81.0000 mg | CHEWABLE_TABLET | Freq: Every day | ORAL | Status: DC

## 2014-07-15 NOTE — Discharge Summary (Signed)
Patient ID: Breanna Alvarez,  MRN: [id number], DOB/AGE: 63-Jul-1952 63 y.o.  Admit date: 07/10/2014 Discharge date: 07/15/2014  Primary Care Provider: No PCP Per Patient Primary Cardiologist: Dr Eldridge Dace  Discharge Diagnoses Active Problems:   Acute systolic HF (heart failure)   NSTEMI (non-ST elevated myocardial infarction)   CAD, multiple vessel   Abnormal PFTs   Moderate mitral regurgitation   Hyperlipidemia LDL goal <70   Leukocytosis   Normocytic anemia    Procedures: Coronary angiogram 07/13/14   Hospital Course:  64 year old [place] female with past medical history of hyperlipidemia who does not seek medication attention regularly who presented 07/10/14 with 2 wks onset of dyspnea. She ruled in for a NSTEMI with a troponin peak of 1.99. Cath done 07/13/14 showed severe 3V CAD. She declined surgical or PCI treatment and will be treated medically. She works in a [employer] and I would keep her out of work till she is seen in follow up after she has been on medications for a week or so.   Discharge Vitals:  Blood pressure 106/48, pulse 82, temperature 98.2 F (36.8 C), temperature source Oral, resp. rate 20, height  (1.499 m), weight 107 lb 4.8 oz (48.671 kg), SpO2 98 %.    Labs: Results for orders placed or performed during the hospital encounter of 07/10/14 (from the past 24 hour(s))  Basic metabolic panel     Status: Abnormal   Collection Time: 07/15/14  5:06 AM  Result Value Ref Range   Sodium 139 135 - 145 mmol/L   Potassium 4.6 3.5 - 5.1 mmol/L   Chloride 104 96 - 112 mmol/L   CO2 26 19 - 32 mmol/L   Glucose, Bld 116 (H) 70 - 99 mg/dL   BUN 19 6 - 23 mg/dL   Creatinine, Ser 4.09 0.50 - 1.10 mg/dL   Calcium 9.4 8.4 - 81.1 mg/dL   GFR calc non Af Amer 77 (L) >90 mL/min   GFR calc Af Amer 90 (L) >90 mL/min   Anion gap 9 5 - 15    Disposition:  Follow-up Information    Follow up with Corky Crafts., MD.   Specialty:  Interventional Cardiology   Why:  office will call you   Contact information:   [address] [place] [address] [phone]       Discharge Medications:    Medication List    TAKE these medications        acetaminophen 325 MG tablet  Commonly known as:  TYLENOL  Take 2 tablets (650 mg total) by mouth every 4 (four) hours as needed for headache or mild pain.     aspirin 81 MG tablet  Take 81 mg by mouth daily.     aspirin 81 MG chewable tablet  Chew 1 tablet (81 mg total) by mouth daily.     atorvastatin 80 MG tablet  Commonly known as:  LIPITOR  Take 1 tablet (80 mg total) by mouth daily at 6 PM.     co-enzyme Q-10 30 MG capsule  Take 30 mg by mouth daily.     isosorbide mononitrate 30 MG 24 hr tablet  Commonly known as:  IMDUR  Take 1 tablet (30 mg total) by mouth daily.     lisinopril 2.5 MG tablet  Commonly known as:  PRINIVIL,ZESTRIL  Take 1 tablet (2.5 mg total) by mouth daily.     metoprolol succinate 50 MG 24 hr tablet  Commonly known as:  TOPROL-XL  Take 1 tablet (50 mg total) by mouth daily. Take with or immediately following a meal.     multivitamin tablet  Take 1 tablet by mouth daily.     nitroGLYCERIN 0.4 MG SL tablet  Commonly known as:  NITROSTAT  Place 1 tablet (0.4 mg total) under the tongue every 5 (five) minutes as needed for chest pain.         Duration of Discharge Encounter: Greater than 30 minutes including physician time.  Jolene ProvostSigned, Mariel Gaudin PA-C 07/15/2014 6:27 PM

## 2014-07-15 NOTE — Discharge Instructions (Signed)
Radial Site Care °Refer to this sheet in the next few weeks. These instructions provide you with information on caring for yourself after your procedure. Your caregiver may also give you more specific instructions. Your treatment has been planned according to current medical practices, but problems sometimes occur. Call your caregiver if you have any problems or questions after your procedure. °HOME CARE INSTRUCTIONS °· You may shower the day after the procedure. Remove the bandage (dressing) and gently wash the site with plain soap and water. Gently pat the site dry. °· Do not apply powder or lotion to the site. °· Do not submerge the affected site in water for 3 to 5 days. °· Inspect the site at least twice daily. °· Do not flex or bend the affected arm for 24 hours. °· No lifting over 5 pounds (2.3 kg) for 5 days after your procedure. °· Do not drive home if you are discharged the same day of the procedure. Have someone else drive you. °· You may drive 24 hours after the procedure unless otherwise instructed by your caregiver. °· Do not operate machinery or power tools for 24 hours. °· A responsible adult should be with you for the first 24 hours after you arrive home. °What to expect: °· Any bruising will usually fade within 1 to 2 weeks. °· Blood that collects in the tissue (hematoma) may be painful to the touch. It should usually decrease in size and tenderness within 1 to 2 weeks. °SEEK IMMEDIATE MEDICAL CARE IF: °· You have unusual pain at the radial site. °· You have redness, warmth, swelling, or pain at the radial site. °· You have drainage (other than a small amount of blood on the dressing). °· You have chills. °· You have a fever or persistent symptoms for more than 72 hours. °· You have a fever and your symptoms suddenly get worse. °· Your arm becomes pale, cool, tingly, or numb. °· You have heavy bleeding from the site. Hold pressure on the site. °Document Released: 05/27/2010 Document Revised:  07/17/2011 Document Reviewed: 05/27/2010 °ExitCare® Patient Information ©2015 [employer]. This information is not intended to replace advice given to you by your health care provider. Make sure you discuss any questions you have with your health care provider. ° °

## 2014-07-15 NOTE — Progress Notes (Signed)
Patient Name: Breanna Alvarez Date of Encounter: 07/15/2014  Active Problems:   Acute systolic HF (heart failure)   NSTEMI (non-ST elevated myocardial infarction)   CAD, multiple vessel   Abnormal PFTs   Moderate mitral regurgitation   Hyperlipidemia LDL goal <70   Leukocytosis   Normocytic anemia   Primary Cardiologist: DB  Patient Profile: 64 yo female w/ no previous CAD was admitted 03/04 w/ CHF, NSTEMI. 3V dz, pt prefers med rx.  SUBJECTIVE: Breathing better, no chest pain or SOB.  OBJECTIVE Filed Vitals:   07/14/14 0953 07/14/14 1520 07/14/14 2054 07/15/14 0513  BP: 120/59 103/53 114/68 124/57  Pulse: 94 83 85 77  Temp:  97.9 F (36.6 C) 97.6 F (36.4 C) 98 F (36.7 C)  TempSrc:  Oral Oral Oral  Resp:  18 20 18   Height:      Weight:    107 lb 4.8 oz (48.671 kg)  SpO2:  97% 98% 95%    Intake/Output Summary (Last 24 hours) at 07/15/14 1002 Last data filed at 07/15/14 0900  Gross per 24 hour  Intake    703 ml  Output    700 ml  Net      3 ml   Filed Weights   07/13/14 0543 07/14/14 0541 07/15/14 0513  Weight: 106 lb 12 oz (48.42 kg) 108 lb 1.6 oz (49.034 kg) 107 lb 4.8 oz (48.671 kg)    PHYSICAL EXAM General: Well developed, well nourished, female in no acute distress. Head: Normocephalic, atraumatic.  Neck: Supple without bruits, JVD not elevated. Lungs:  Resp regular and unlabored, clear. Heart: RRR, S1, S2, no S3, S4, or murmur; no rub. Abdomen: Soft, non-tender, non-distended, BS + x 4.  Extremities: No clubbing, cyanosis, no edema.  Neuro: Alert and oriented X 3. Moves all extremities spontaneously. Psych: Normal affect.  LABS: CBC: Recent Labs  07/13/14 0535 07/14/14 0530  WBC 12.2* 12.0*  HGB 10.2* 9.8*  HCT 32.0* 31.0*  MCV 78.6 78.5  PLT 378 370   INR: Recent Labs  07/13/14 0535  INR 1.06   Basic Metabolic Panel: Recent Labs  07/14/14 0530 07/15/14 0506  NA 140 139  K 4.3 4.6  CL 105 104  CO2 26 26  GLUCOSE 116*  116*  BUN 11 19  CREATININE 0.78 0.80  CALCIUM 9.2 9.4   TROPONIN I  Date Value Ref Range Status  07/11/2014 1.99* <0.031 ng/mL Final  07/10/2014 1.97* <0.031 ng/mL Final  07/10/2014 1.52* <0.031 ng/mL Final   BNP:  B NATRIURETIC PEPTIDE  Date/Time Value Ref Range Status  07/10/2014 01:15 PM 1059.1* 0.0 - 100.0 pg/mL Final   Lab Results  Component Value Date   CHOL 347* 07/11/2014   HDL 27* 07/11/2014   LDLCALC 300* 07/11/2014   TRIG 100 07/11/2014   CHOLHDL 12.9 07/11/2014   Thyroid Function Tests: Recent Labs  07/14/14 0530  TSH 3.572   TELE: SR     Echo: 07/10/2014 EF 40-45% Mod MR, PAS elevated   Cardiac Cath: 07/13/2014 IMPRESSIONS: 1. Normal left main coronary artery. 2. Severe disease in the mid left anterior descending artery and its diagonal branches which originate at the LAD stenosis. 3. Severe disease in the mid left circumflex artery with restricted flow into the distal circumflex and OM branches. 4. Severe disease in the mid right coronary artery with a widely patent distal RCA system. There is evidence of right-to-left collaterals. 5. Left ventricular systolic function not assessed. LVEDP 8 mmHg.  6. Normal right heart pressures. Pulmonary artery saturation 63%, aortic saturation 93%. Cardiac output 4.5 L/m. Cardiac index 3.2.  Radiology/Studies: Ct Chest W Contrast 07/14/2014   CLINICAL DATA:  Patient with shortness of breath and abnormal pulmonary function test. Low grade temperature and leukocytosis.  EXAM: CT CHEST WITH CONTRAST  TECHNIQUE: Multidetector CT imaging of the chest was performed during intravenous contrast administration.  CONTRAST:  75mL OMNIPAQUE IOHEXOL 300 MG/ML  SOLN  COMPARISON:  Chest radiograph 01/25/2011  FINDINGS: Mediastinum/Nodes: No enlarged axillary, mediastinal or hilar lymphadenopathy. Heart is enlarged. No pericardial effusion. Normal caliber aorta and main pulmonary artery. Extensive atherosclerotic plaque involving  the visualized thoracic aorta.  Lungs/Pleura: Bandlike consolidative opacities within the left lower lobe. Subpleural ground-glass opacities within the right lower lobe. No pleural effusion or pneumothorax. Patent central airways.  Upper abdomen: Visualized hepatic parenchyma is unremarkable. The adrenal glands are unremarkable.  Musculoskeletal: No aggressive or acute appearing osseous lesions.  IMPRESSION: Bandlike consolidative opacities within left lower lobe. Additionally there are subpleural ground-glass opacities within the right lower lobe. Findings may be secondary to atelectasis or infection.  No plural effusion or pneumothorax.  Extensive atherosclerotic plaque involving the visualized thoracic aorta.   Electronically Signed   By: Annia Belt M.D.   On: 07/14/2014 13:18     Current Medications:  . aspirin  81 mg Oral Daily  . atorvastatin  80 mg Oral q1800  . heparin subcutaneous  5,000 Units Subcutaneous 3 times per day  . isosorbide mononitrate  30 mg Oral Daily  . lisinopril  2.5 mg Oral Daily  . metoprolol succinate  50 mg Oral Daily  . multivitamin with minerals  1 tablet Oral Daily  . sodium chloride  3 mL Intravenous Q12H  . spironolactone  12.5 mg Oral Daily      ASSESSMENT AND PLAN: Active Problems:   Acute systolic HF (heart failure) - euvolemic - not on loop diuretic, on spirononlactone - on BB, ACE    NSTEMI (non-ST elevated myocardial infarction) - pt prefers medical therapy - on ASA, BB, ACE, nitrates    CAD, multiple vessel - see above    Abnormal PFTs - follow, not a tobacco user    Moderate mitral regurgitation - follow, asymptomatic    Hyperlipidemia LDL goal <70 - LDL 300 - Lipitor 80 is new    Leukocytosis - follow, no S&S of acute infection - may be 2nd NSTEMI    Normocytic anemia - f/u with primary MD   Signed, Theodore Demark , PA-C 10:02 AM 07/15/2014  Personally seen and examined. Agree with above. Ready for DC No CP with  walking Long discussion with Dr. Maren Beach Note reviewed.   Donato Schultz, MD

## 2014-07-15 NOTE — Progress Notes (Signed)
CARDIAC REHAB PHASE I   PRE:  Rate/Rhythm: 82 SR    BP: sitting 126/61    SaO2: 98 RA  MODE:  Ambulation: 460 ft   POST:  Rate/Rhythm: 107 ST  Tolerated well, no angina. HR elevated with slow pace. She sts she normally walks slow. Reviewed ed with pt and husband. Good understanding. [phone]  Elissa LovettReeve, Danni Leabo GreenvilleKristan CES, ACSM 07/15/2014 10:44 AM

## 2014-07-15 NOTE — Plan of Care (Signed)
Problem: Phase II Progression Outcomes Goal: Walk in hall or up in chair TID Outcome: Completed/Met Date Met:  07/15/14 Patient independent in room, walked in hall with Cardiac Rehab

## 2014-07-16 LAB — CULTURE, BLOOD (ROUTINE X 2)
Culture: NO GROWTH
Culture: NO GROWTH

## 2014-07-16 LAB — URINE CULTURE
CULTURE: NO GROWTH
Colony Count: NO GROWTH

## 2014-07-16 SURGERY — CORONARY ARTERY BYPASS GRAFTING (CABG)
Anesthesia: General | Site: Chest

## 2014-07-20 ENCOUNTER — Telehealth: Payer: Self-pay | Admitting: Cardiology

## 2014-07-20 NOTE — Telephone Encounter (Signed)
I called and spoke with the patient. She is aware that Dr. Eldridge DaceVaranasi feels she would benefit more from fixing one of her vessels with a stent to help improve blood flow to her heart as this could be contributing to her symptoms. She is also aware that in the meantime, she can cut her lipitor in half to see how this will make her feel. She states that she takes her lipitor at night and that she develops a feeling of indigestion after that. She will belch and feel better. She states she will cut her lipitor dose in half. She will monitor her symptoms. She will let us know if things to do not improve for her, but she is also aware if her symptoms become more urgent, to report to the ER. She voices understanding.

## 2014-07-20 NOTE — Telephone Encounter (Signed)
Returned call to patient she stated after she takes atorvastatin she has a lot of gas,chest tightness.Message sent to Dr.Varanasi.

## 2014-07-20 NOTE — Telephone Encounter (Signed)
If she is having chest tightness, I think that is much more likely related to her CAD rather than the lipitor.  Since she declined CABG, I think we should try to at least fix her right coronary artery with a stent.  That lesion is more straightforward to fix with a stent and will hopefully make her feel better by improving blood flow to her heart.  I would try to get this done later this week.  There is no advantage in waiting.   In the meantime, she can decrease the liptor dose in half but I think long term, improving blood flow for her is going to help her the most.

## 2014-07-20 NOTE — Telephone Encounter (Signed)
Please call,the medicine she was given Atorvastatin is making her feel bad.

## 2014-07-28 ENCOUNTER — Telehealth (HOSPITAL_COMMUNITY): Payer: Self-pay | Admitting: Cardiac Rehabilitation

## 2014-07-28 NOTE — Telephone Encounter (Signed)
pc to pt to enroll in cardiac rehab. Pt is interested however does not have transportation. [facility] contacted on pt behalf. They will mail pt an application. Pt made aware. Will call pt in 1 week to reassess.

## 2014-08-10 ENCOUNTER — Encounter: Payer: Self-pay | Admitting: *Deleted

## 2014-08-10 ENCOUNTER — Encounter: Payer: Self-pay | Admitting: Physician Assistant

## 2014-08-10 ENCOUNTER — Ambulatory Visit (INDEPENDENT_AMBULATORY_CARE_PROVIDER_SITE_OTHER): Payer: Medicaid Other | Admitting: Physician Assistant

## 2014-08-10 VITALS — BP 150/72 | HR 105 | Ht 60.0 in | Wt 106.0 lb

## 2014-08-10 DIAGNOSIS — R942 Abnormal results of pulmonary function studies: Secondary | ICD-10-CM | POA: Diagnosis not present

## 2014-08-10 MED ORDER — METOPROLOL SUCCINATE ER 50 MG PO TB24: ORAL_TABLET | ORAL | Status: DC

## 2014-08-10 NOTE — Patient Instructions (Signed)
Your physician has recommended you make the following change in your medication: INCREASE  Metoprolol to ( 75 mg ) one and one half tablet daily  Your physician recommends that you return for a FASTING lipid profile the day of your appointment with Dr. Eldridge DaceVaranasi on May 26 @ 9:30 am   Patient was given a work note today stating pt can no longer work and will be applying for Disability due to severe Coronary Disease

## 2014-08-10 NOTE — Assessment & Plan Note (Signed)
No evidence of heart failure on exam today. 

## 2014-08-10 NOTE — Assessment & Plan Note (Addendum)
Severe three-vessel CAD. Patient has requested medical therapy and declined CABG. Overall she seems to be stable without angina. Her blood pressure and heart rate are elevated today because she is anxious. I will increase her metoprolol to 75 mg once daily. Follow-up with Dr. Eldridge DaceVaranasi in 1 month. There was also a discussion in the hospital about Plavix. We'll see if he wants to add this. Also recommend that she not return to work and apply for disability.

## 2014-08-10 NOTE — Assessment & Plan Note (Signed)
Check lipids and LFTs in one month.

## 2014-08-10 NOTE — Progress Notes (Signed)
Cardiology Office Note   Date:  08/10/2014   ID:  Breanna Alvarez, DOB 05/13/1950, MRN [id number]  PCP:  No PCP Per Patient  Cardiologist:  Dr. Eldridge Dace  Chief Complaint:    History of Present Illness: Breanna Alvarez is a 64 y.o. female who presents for post hospital follow-up. She was admitted with a NSTEMI and cardiac catheterization on 07/13/14 showed severe three-vessel CAD. Dr. Zenaida Niece trite discussed CABG plus possible mitral valve repair but she wanted to pursue medical therapy. Dr. [place] had along discussion with the patient about the high risk of PCI given the complex lesions and risk of closing off side branches. He ultimately recommended CABG as well. After discussing risks of recurrent MI, worsening heart failure or death the patient wishes to continue medical therapy. She was treated with aspirin, beta blocker and statin. Plavix was not prescribed given the possibility that she'd agreed to CABG in the future. EF was 40-45% with elevated PA pressures and moderate MR on 2-D echo. She had abnormal PFTs with severe interstitial restriction and severe diffuse defect concerning for interstitial inflammation or fibrosis. CT of the chest showed bandlike consolidative opacities within the left lower lobe and subpleural groundglass opacities within the right lower lobe. Findings may be secondary to atelectasis or infection. Extensive atherosclerotic plaque involving the thoracic aorta was noted.  Patient called in on 07/20/14 complaining of chest pain. Dr. Eldridge Dace recommended that the patient come in so he could try to fix her RCA with a stent. He felt this lesion was more straightforward to fix with a stent and would hopefully make her feel better proving blood flow to her heart. She never came in for this. She felt like the atorvastatin was causing her chest pain and it was cut in half. She has had no further chest pain since she decreased her Lipitor to 40 mg daily.  She is brought in by her husband  today. She is trying to treat her coronary artery disease holistically by drinking antioxidant drinks and juicing every day. She denies any chest tightness, pressure, dyspnea, dyspnea on exertion, dizziness, presyncope or edema. She is asking about going back to work. 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 She is walking 20 minutes a day without difficulty. She checks her blood pressure every day and it's usually 109-118 systolic. Her blood pressure and heart rate are elevated here in the office today.   Past Medical History  Diagnosis Date  . Hyperlipidemia   . Acute systolic CHF (congestive heart failure), NYHA class 3 07/10/2014  . Hypertension     Past Surgical History  Procedure Laterality Date  . No past surgeries    . Left heart catheterization with coronary angiogram N/A 07/13/2014    Procedure: LEFT HEART CATHETERIZATION WITH CORONARY ANGIOGRAM;  Surgeon: Corky Crafts, MD; mLAD 95%, dLAD 95%, D1 95%, CFX severe dz, mRCA 80%     Current Outpatient Prescriptions  Medication Sig Dispense Refill  . acetaminophen (TYLENOL) 325 MG tablet Take 2 tablets (650 mg total) by mouth every 4 (four) hours as needed for headache or mild pain.    Marland Kitchen aspirin 81 MG chewable tablet Chew 1 tablet (81 mg total) by mouth daily. 30 tablet 11  . aspirin 81 MG tablet Take 81 mg by mouth daily.    Marland Kitchen atorvastatin (LIPITOR) 80 MG tablet Take 1/2 tablet (40 mg) by mouth once every evening    .  co-enzyme Q-10 30 MG capsule Take 30 mg by mouth daily.    . isosorbide mononitrate (IMDUR) 30 MG 24 hr tablet Take 1 tablet (30 mg total) by mouth daily. 30 tablet 11  . lisinopril (PRINIVIL,ZESTRIL) 2.5 MG tablet Take 1 tablet (2.5 mg total) by mouth daily. 30 tablet 11  . metoprolol succinate (TOPROL-XL) 50 MG 24 hr tablet Take 1 tablet (50 mg total) by mouth daily. Take with or immediately following a meal. 30 tablet 11  . Multiple Vitamin  (MULTIVITAMIN) tablet Take 1 tablet by mouth daily.    . nitroGLYCERIN (NITROSTAT) 0.4 MG SL tablet Place 1 tablet (0.4 mg total) under the tongue every 5 (five) minutes as needed for chest pain. 25 tablet 2   No current facility-administered medications for this visit.    Allergies:   Review of patient's allergies indicates no known allergies.    Social History:  The patient  reports that she has never smoked. She does not have any smokeless tobacco history on file. She reports that she does not drink alcohol or use illicit drugs.   Family History:  The patient's    family history includes Heart attack in her mother; Stroke in her father.    ROS:  Please see the history of present illness.   Otherwise, review of systems are positive for none.   All other systems are reviewed and negative.    PHYSICAL EXAM: BP 150/72 mmHg  Pulse 105  Ht 5' (1.524 m)  Wt 106 lb (48.081 kg)  BMI 20.70 kg/m2 GEN: Well nourished, well developed, in no acute distress Neck: no JVD, HJR, carotid bruits, or masses Cardiac: RRR; 2/6 systolic murmur at the left sternal border, no gallop, rubs, thrill or heave,  Respiratory:  clear to auscultation bilaterally, normal work of breathing GI: soft, nontender, nondistended, + BS MS: no deformity or atrophy Extremities: without cyanosis, clubbing, edema, good distal pulses bilaterally.  Skin: warm and dry, no rash Neuro:  Strength and sensation are intact    EKG:  EKG is ordered today. The ekg ordered today demonstrates sinus tachycardia at 105 bpm with septal infarct and ST T wave abnormality inferior lateral. Recent Labs: 07/10/2014: ALT 18; B Natriuretic Peptide 1059.1* 07/14/2014: Hemoglobin 9.8*; Platelets 370; TSH 3.572 07/15/2014: BUN 19; Creatinine 0.80; Potassium 4.6; Sodium 139    Lipid Panel    Component Value Date/Time   CHOL 347* 07/11/2014 0425   TRIG 100 07/11/2014 0425   HDL 27* 07/11/2014 0425   CHOLHDL 12.9 07/11/2014 0425   VLDL 20  07/11/2014 0425   LDLCALC 300* 07/11/2014 0425      Wt Readings from Last 3 Encounters:  07/15/14 107 lb 4.8 oz (48.671 kg)  07/10/14 112 lb (50.803 kg)      Other studies Reviewed: Additional studies/ records that were reviewed today include and review of the records demonstrates:  IMPRESSION: Bandlike consolidative opacities within left lower lobe. Additionally there are subpleural ground-glass opacities within the right lower lobe. Findings may be secondary to atelectasis or infection.   No plural effusion or pneumothorax.   Extensive atherosclerotic plaque involving the visualized thoracic aorta. Cardiac Cath: 07/13/2014 IMPRESSIONS: 1. Normal left main coronary artery. 2. Severe disease in the mid left anterior descending artery and its diagonal branches which originate at the LAD stenosis. 3. Severe disease in the mid left circumflex artery with restricted flow into the distal circumflex and OM branches. 4. Severe disease in the mid right coronary artery with a widely patent  distal RCA system. There is evidence of right-to-left collaterals. 5. Left ventricular systolic function not assessed.  LVEDP 8 mmHg.  6.   Normal right heart pressures.   Pulmonary artery saturation 63%, aortic saturation 93%. Cardiac output 4.5 L/m. Cardiac index 3.2.   2-D echo 07/10/14 Study Conclusions  - Left ventricle: There was mild focal basal hypertrophy of the   septum. Systolic function was mildly to moderately reduced. The   estimated ejection fraction was in the range of 40% to 45%.   Diffuse hypokinesis. - Aortic valve: There was mild regurgitation. - Mitral valve: There was moderate regurgitation. - Pulmonary arteries: PA peak pressure: 55 mm Hg (S).   ASSESSMENT AND PLAN:  CAD, multiple vessel Severe three-vessel CAD. Patient has requested medical therapy and declined CABG. Overall she seems to be stable without angina. Her blood pressure and heart rate are elevated today  because she is anxious. I will increase her metoprolol to 75 mg once daily. Follow-up with Dr. Eldridge DaceVaranasi in 1 month. There was also a discussion in the hospital about Plavix. We'll see if he wants to add this.   Chronic systolic heart failure No evidence of heart failure on exam today.   Hyperlipidemia LDL goal <70 Check lipids and LFTs in one month.     Elson ClanSigned, Breanna Yamamoto, PA-C  08/10/2014 12:53 PM    [facility] [address], [place], [place]  [address] Phone: [phone](336) [phone]; Fax: [phone](336) [phone]

## 2014-09-08 ENCOUNTER — Telehealth (HOSPITAL_COMMUNITY): Payer: Self-pay | Admitting: Cardiac Rehabilitation

## 2014-09-08 NOTE — Telephone Encounter (Signed)
pc to pt to enroll in cardiac rehab. Pt declined due to insurance.  Pt is walking on her own.  Dr. Eldridge DaceVaranasi made aware.

## 2014-10-01 ENCOUNTER — Ambulatory Visit (INDEPENDENT_AMBULATORY_CARE_PROVIDER_SITE_OTHER): Payer: Medicaid Other | Admitting: Interventional Cardiology

## 2014-10-01 ENCOUNTER — Encounter: Payer: Self-pay | Admitting: Interventional Cardiology

## 2014-10-01 VITALS — BP 160/74 | HR 93 | Ht 60.0 in | Wt 104.8 lb

## 2014-10-01 DIAGNOSIS — E785 Hyperlipidemia, unspecified: Secondary | ICD-10-CM

## 2014-10-01 DIAGNOSIS — I2510 Atherosclerotic heart disease of native coronary artery without angina pectoris: Secondary | ICD-10-CM

## 2014-10-01 DIAGNOSIS — I10 Essential (primary) hypertension: Secondary | ICD-10-CM | POA: Diagnosis not present

## 2014-10-01 DIAGNOSIS — I340 Nonrheumatic mitral (valve) insufficiency: Secondary | ICD-10-CM

## 2014-10-01 LAB — COMPREHENSIVE METABOLIC PANEL
ALBUMIN: 4 g/dL (ref 3.5–5.2)
ALT: 9 U/L (ref 0–35)
AST: 12 U/L (ref 0–37)
Alkaline Phosphatase: 71 U/L (ref 39–117)
BILIRUBIN TOTAL: 0.4 mg/dL (ref 0.2–1.2)
BUN: 26 mg/dL — AB (ref 6–23)
CHLORIDE: 105 meq/L (ref 96–112)
CO2: 30 mEq/L (ref 19–32)
CREATININE: 0.93 mg/dL (ref 0.40–1.20)
Calcium: 9.3 mg/dL (ref 8.4–10.5)
GFR: 64.84 mL/min (ref >60.00)
Glucose, Bld: 105 mg/dL — ABNORMAL HIGH (ref 70–99)
POTASSIUM: 4.3 meq/L (ref 3.5–5.1)
Sodium: 140 mEq/L (ref 135–145)
Total Protein: 8.2 g/dL (ref 6.0–8.3)

## 2014-10-01 LAB — LIPID PANEL
CHOL/HDL RATIO: 12
Cholesterol: 337 mg/dL — ABNORMAL HIGH (ref 0–200)
HDL: 28.1 mg/dL — AB (ref >39.00)
LDL CALC: 293 mg/dL — AB (ref 0–99)
NONHDL: 308.9
TRIGLYCERIDES: 79 mg/dL (ref 0.0–149.0)
VLDL: 15.8 mg/dL (ref 0.0–40.0)

## 2014-10-01 NOTE — Patient Instructions (Signed)
Medication Instructions:  Same-no change  Labwork: Today (Lipids and CMET)  Testing/Procedures: None  Follow-Up: Your physician wants you to follow-up in: 6 months. You will receive a reminder letter in the mail two months in advance. If you don't receive a letter, please call our office to schedule the follow-up appointment.

## 2014-10-01 NOTE — Progress Notes (Signed)
Patient ID: Breanna Alvarez, female   DOB: 1950-07-02, 64 y.o.   MRN: [id number]     Cardiology Office Note   Date:  10/01/2014   ID:  Breanna Alvarez, DOB 09/07/1950, MRN [id number]  PCP:  No PCP Per Patient    No chief complaint on file. f/u CAD   Wt Readings from Last 3 Encounters:  10/01/14 104 lb 12.8 oz (47.537 kg)  08/10/14 106 lb (48.081 kg)  07/15/14 107 lb 4.8 oz (48.671 kg)       History of Present Illness: Breanna Alvarez is a 64 y.o. female  Who had a NSTEMI.  She was diagnosed with multivessel CAD and CABG was recommended.  She declined CABG and has been trying to change her diet. She states she is taking antioxidants. She is trying to eat less concentrated sugars and less fried foods. She has had some issues with depression as well. She is also trying natural solutions to these rather than prescription medicines. She is still not interested in bypass surgery. I once again explained the anatomy of her coronary artery disease with significant LAD disease at a critical branch point. She did have some chest discomfort immediately after being in the hospital. This has resolved. She is not interested in any revascularization at this time. We also spoke about the possibility of trying revascularization of the more straightforward blockages that she has in her other vessels. She is aware that this is a possibility, but does not feel it is necessary at this time.  She spoke about her son passing away in 2012 when he was 45. This is a source of great stress for her. Her brother recently passed away.  Although she likes to try to avoid taking medications, she is willing to take her current cardiac medicines. She did not take her medicines this morning due to needing fasting blood work.     Past Medical History  Diagnosis Date  . Hyperlipidemia   . Acute systolic CHF (congestive heart failure), NYHA class 3 07/10/2014  . Hypertension     Past Surgical History  Procedure  Laterality Date  . No past surgeries    . Left heart catheterization with coronary angiogram N/A 07/13/2014    Procedure: LEFT HEART CATHETERIZATION WITH CORONARY ANGIOGRAM;  Surgeon: Corky Crafts, MD; mLAD 95%, dLAD 95%, D1 95%, CFX severe dz, mRCA 80%     Current Outpatient Prescriptions  Medication Sig Dispense Refill  . aspirin 81 MG chewable tablet Chew 1 tablet (81 mg total) by mouth daily. 30 tablet 11  . atorvastatin (LIPITOR) 80 MG tablet Take 1/2 tablet (40 mg) by mouth once every evening    . isosorbide mononitrate (IMDUR) 30 MG 24 hr tablet Take 1 tablet (30 mg total) by mouth daily. 30 tablet 11  . lisinopril (PRINIVIL,ZESTRIL) 2.5 MG tablet Take 1 tablet (2.5 mg total) by mouth daily. 30 tablet 11  . metoprolol succinate (TOPROL-XL) 50 MG 24 hr tablet Take one and one half tablet ( 75 mg ) daily 45 tablet 3  . Multiple Vitamin (MULTIVITAMIN) tablet Take 1 tablet by mouth daily.    . nitroGLYCERIN (NITROSTAT) 0.4 MG SL tablet Place 1 tablet (0.4 mg total) under the tongue every 5 (five) minutes as needed for chest pain. (Patient taking differently: Place 0.4 mg under the tongue every 5 (five) minutes as needed for chest pain ((MAX of 2 doses)). ) 25 tablet 2   No current facility-administered medications for this visit.  Allergies:   Review of patient's allergies indicates no known allergies.    Social History:  The patient  reports that she has never smoked. She has never used smokeless tobacco. She reports that she does not drink alcohol or use illicit drugs.   Family History:  The patient's *family history includes Heart attack in her mother; Stroke in her father.    ROS:  Please see the history of present illness.   Otherwise, review of systems are positive for depression, chest pain several months ago.   All other systems are reviewed and negative.    PHYSICAL EXAM: VS:  BP 160/74 mmHg  Pulse 93  Ht 5' (1.524 m)  Wt 104 lb 12.8 oz (47.537 kg)  BMI 20.47  kg/m2  SpO2 99% , BMI Body mass index is 20.47 kg/(m^2). GEN: Well nourished, well developed, in no acute distress HEENT: normal Neck: no JVD, carotid bruits, or masses Cardiac: *RRR; no murmurs, rubs, or gallops,no edema  Respiratory:  clear to auscultation bilaterally, normal work of breathing GI: soft, nontender, nondistended, + BS MS: no deformity or atrophy Skin: warm and dry, no rash Neuro:  Strength and sensation are intact Psych: euthymic mood, full affect      Recent Labs: 07/10/2014: ALT 18; B Natriuretic Peptide 1059.1* 07/14/2014: Hemoglobin 9.8*; Platelets 370; TSH 3.572 07/15/2014: BUN 19; Creatinine 0.80; Potassium 4.6; Sodium 139   Lipid Panel    Component Value Date/Time   CHOL 347* 07/11/2014 0425   TRIG 100 07/11/2014 0425   HDL 27* 07/11/2014 0425   CHOLHDL 12.9 07/11/2014 0425   VLDL 20 07/11/2014 0425   LDLCALC 300* 07/11/2014 0425     Other studies Reviewed: Additional studies/ records that were reviewed today with results demonstrating: cath report as noted above.   ASSESSMENT AND PLAN:  1. CAD: Severe three-vessel coronary artery disease. She continues to decline bypass surgery. I think this would be the best treatment for her. She is denying angina. She wants to try a natural approach to treatment. I again explained to her to that if she had more symptoms, we could consider doing PCI of the less complicated blockages in her vessels other than the LAD. 2. Hyperlipidemia: LDL 300 at last check. This is genetic. Her diet has improved but this will not likely get her LDL to target. Checking lipids today. Lipitor at this dose may not be sufficient. Will adjust medicines based on results. 3. Hypertension: Continue home medicines. Check her blood pressure at home. If they're readings over 140/90, they should call us. Recently, she states that her pressure has been well controlled and it is only elevated because she didn't take her medicines this morning and  because she is in the doctor's office. 4. Overall, I think this patient is at high risk for cardiac complications. She has severe LAD disease that could cause an MI or ischemic cardiomyopathy if it continues to go untreated. She has been informed of all of this and continues to decline revascularization at this time. Will continue aggressive medical therapy. 5. Mitrla regurgitation: Moderate. No signs of CHF at this time.   Current medicines are reviewed at length with the patient today.  The patient concerns regarding her medicines were addressed.  The following changes have been made:  No change  Labs/ tests ordered today include: lipids No orders of the defined types were placed in this encounter.    Recommend 150 minutes/week of aerobic exercise Low fat, low carb, high fiber diet recommended  Disposition:   FU in 6 months   Delorise JacksonSigned, Charliene Inoue S., MD  10/01/2014 10:06 AM    [facility] [address], [place], [place]  [address] Phone: [phone](336) [phone]; Fax: [phone]) [phone]

## 2014-10-06 ENCOUNTER — Telehealth: Payer: Self-pay | Admitting: Interventional Cardiology

## 2014-10-06 NOTE — Telephone Encounter (Signed)
Left message to call back  

## 2014-10-06 NOTE — Telephone Encounter (Signed)
F/u ° ° °Pt returning your call °

## 2014-10-06 NOTE — Telephone Encounter (Signed)
Called pt in regards to lab results and while speaking with pt she states that she went to the pharmacy to pick up her Metoprolol Succinate and that it had went from $4 to $42 and was told that they did not have the generic. Informed pt that I would contact the pharmacy and see what was going on. Spoke with a representative from [facility] on [address]. Representative stated that they needed the pt's insurance information, that is why the prescription was higher. Attempted to call pt to inform of this but had to leave a message to call back.

## 2014-10-07 MED ORDER — METOPROLOL TARTRATE 50 MG PO TABS: 50.0000 mg | ORAL_TABLET | Freq: Two times a day (BID) | ORAL | Status: DC

## 2014-10-07 NOTE — Telephone Encounter (Signed)
Spoke with pt and informed her that the pharmacy said the reason for the price increase is because they need her new insurance information. Pt states that she no longer has insurance and so she needs to switch to something on the [facility] list at [facility]. [facility] and she states that the Metoprolol Tartrate is on the $4 list. Will forward to Dr. Eldridge DaceVaranasi for review and advisement on switching pt to Metoprolol Tartrate.

## 2014-10-07 NOTE — Telephone Encounter (Signed)
Spoke with pt and informed her of new order for Metoprolol Tartrate 50mg  bid. Pt verbalized understanding and was in agreement with this plan.  [facility] [address].

## 2014-10-07 NOTE — Telephone Encounter (Signed)
Left message to call back  

## 2014-10-07 NOTE — Telephone Encounter (Signed)
OK to switch to metoprolol tartrate 50 mg PO BID

## 2014-10-07 NOTE — Telephone Encounter (Signed)
Follow up  Pt returning [name] phone call

## 2014-10-16 ENCOUNTER — Ambulatory Visit (INDEPENDENT_AMBULATORY_CARE_PROVIDER_SITE_OTHER): Payer: Medicaid Other | Admitting: Pharmacist

## 2014-10-16 DIAGNOSIS — E785 Hyperlipidemia, unspecified: Secondary | ICD-10-CM

## 2014-10-16 MED ORDER — ROSUVASTATIN CALCIUM 20 MG PO TABS: 20.0000 mg | ORAL_TABLET | Freq: Every day | ORAL | Status: DC

## 2014-10-16 NOTE — Patient Instructions (Signed)
Stop Lipitor.  Start Crestor 20mg  once daily.  Please call Kennon Rounds at [phone] in a few weeks to let me know how you are doing.

## 2014-10-19 ENCOUNTER — Telehealth: Payer: Self-pay | Admitting: *Deleted

## 2014-10-19 MED ORDER — PRAVASTATIN SODIUM 80 MG PO TABS: 80.0000 mg | ORAL_TABLET | Freq: Every evening | ORAL | Status: DC

## 2014-10-19 NOTE — Telephone Encounter (Signed)
Spoke with pt.  The free 30-day card did not work at the pharmacy.  Verified through [facility] rep that they were no longer honoring these cards- only the copay assistance cards.  Will need to change patient to pravastatin to help with the cost.  She is aware of the change and new Rx sent to the pharmacy

## 2014-10-19 NOTE — Progress Notes (Signed)
Patient ID: Breanna Alvarez, female   DOB: October 20, 1950, 64 y.o.   MRN: [id number]     Cardiology Office Note   Date:  10/19/2014   ID:  Breanna Alvarez, DOB [birth year], MRN [id number]    Chief Complaint  Patient presents with  . Hyperlipidemia     History of Present Illness: Breanna Alvarez is a 64 y.o. female patient of Dr. Eldridge Dace who was referred to the [facility] for elevated LDL.  She has a PMH of NSTEMI.  She was diagnosed with multivessel CAD and CABG was recommended.  She declined CABG and/or PCI and has been trying to change her diet to control her disease.  Although she likes to try to avoid taking medications, she is willing to take some but often tries herbal alternatives first.  She was first placed on Lipitor for her cholesterol but states she has stopped this due to back pain.  She has started some type of [place] seed that she states will help her cholesterol and wants to try this rather than any other medication.   Past Medical History  Diagnosis Date  . Hyperlipidemia   . Acute systolic CHF (congestive heart failure), NYHA class 3 07/10/2014  . Hypertension     Past Surgical History  Procedure Laterality Date  . No past surgeries    . Left heart catheterization with coronary angiogram N/A 07/13/2014    Procedure: LEFT HEART CATHETERIZATION WITH CORONARY ANGIOGRAM;  Surgeon: Corky Crafts, MD; mLAD 95%, dLAD 95%, D1 95%, CFX severe dz, mRCA 80%     Current Outpatient Prescriptions  Medication Sig Dispense Refill  . aspirin 81 MG chewable tablet Chew 1 tablet (81 mg total) by mouth daily. 30 tablet 11  . isosorbide mononitrate (IMDUR) 30 MG 24 hr tablet Take 1 tablet (30 mg total) by mouth daily. 30 tablet 11  . lisinopril (PRINIVIL,ZESTRIL) 2.5 MG tablet Take 1 tablet (2.5 mg total) by mouth daily. 30 tablet 11  . metoprolol (LOPRESSOR) 50 MG tablet Take 1 tablet (50 mg total) by mouth 2 (two) times daily. 180 tablet 3  . Multiple Vitamin (MULTIVITAMIN)  tablet Take 1 tablet by mouth daily.    . nitroGLYCERIN (NITROSTAT) 0.4 MG SL tablet Place 1 tablet (0.4 mg total) under the tongue every 5 (five) minutes as needed for chest pain. (Patient taking differently: Place 0.4 mg under the tongue every 5 (five) minutes as needed for chest pain ((MAX of 2 doses)). ) 25 tablet 2  . rosuvastatin (CRESTOR) 20 MG tablet Take 1 tablet (20 mg total) by mouth daily. 30 tablet 0   No current facility-administered medications for this visit.    Allergies:   Review of patient's allergies indicates no known allergies.    Social History:  The patient  reports that she has never smoked. She has never used smokeless tobacco. She reports that she does not drink alcohol or use illicit drugs.   Family History:  The patient's *family history includes Heart attack in her mother; Stroke in her father.    Lipid Panel    Component Value Date/Time   CHOL 337* 10/01/2014 1022   TRIG 79.0 10/01/2014 1022   HDL 28.10* 10/01/2014 1022   CHOLHDL 12 10/01/2014 1022   VLDL 15.8 10/01/2014 1022   LDLCALC 293* 10/01/2014 1022      ASSESSMENT AND PLAN:  1.  Hyperlipidemia-  Pt's LDL extremely elevated.  We had a long talk about how the body makes cholesterol versus what  is contributed by her diet.  I tried to explain to her that no matter what she does with diet and supplements, she will never be able to get her cholesterol low enough to help improve her prognosis.  We discussed the importance of treating cholesterol in light of her current CAD.  She is aware of the gravity of not treating her CAD and states that she is at peace with not pursing surgical options at this time.  She is willing to try medications.  Will switch her to Crestor  once daily.  She was given a free 30-day free card and will call here in a few weeks to let us know how she is doing.    Edrick Oh [facility]  10/19/2014 12:24 PM    [facility] [address] [place],  [place], [place]  [address] Phone: [phone]; Fax: [phone]

## 2014-10-19 NOTE — Telephone Encounter (Signed)
Patient called and stated that she does not have insurance and cannot afford the crestor, as the oop cost is over $400.00. Please advise. Thanks, MI

## 2014-11-26 DIAGNOSIS — Z0271 Encounter for disability determination: Secondary | ICD-10-CM

## 2014-12-22 ENCOUNTER — Telehealth: Payer: Self-pay | Admitting: Interventional Cardiology

## 2014-12-22 NOTE — Telephone Encounter (Signed)
Received records from [employer] forwarded 5 pages via fax to Dr. Verdis Prime 12/22/14 fbg.

## 2014-12-28 ENCOUNTER — Other Ambulatory Visit: Payer: Self-pay

## 2014-12-28 ENCOUNTER — Telehealth: Payer: Self-pay

## 2014-12-28 NOTE — Telephone Encounter (Signed)
Call Documentation      Evie Lacks, [facility] at 10/19/2014 4:43 PM     Status: Signed       Expand All Collapse All   Spoke with pt. The free 30-day card did not work at the pharmacy. Verified through [facility] rep that they were no longer honoring these cards- only the copay assistance cards. Will need to change patient to pravastatin to help with the cost. She is aware of the change and new Rx sent to the pharmacy            Valrie Hart, CMA at 10/19/2014 9:10 AM     Status: Signed       Expand All Collapse All   Patient called and stated that she does not have insurance and cannot afford the crestor, as the oop cost is over $400.00. Please advise. Thanks, MI             Approved      Disp Refills Start End    pravastatin (PRAVACHOL) 80 MG tablet 30 tablet 3 10/19/2014     Sig - Route:  Take 1 tablet (80 mg total) by mouth every evening. - Oral    Class:  Normal    Authorizing Provider:  Corky Crafts, MD    Ordering User:  Evie Lacks, [facility]

## 2014-12-28 NOTE — Telephone Encounter (Signed)
PA obtained for Crestor  Auth # [id number]. Good through 12/23/2015. Pharmacy notified.

## 2015-01-04 ENCOUNTER — Encounter: Payer: Self-pay | Admitting: Cardiology

## 2015-01-04 ENCOUNTER — Ambulatory Visit (INDEPENDENT_AMBULATORY_CARE_PROVIDER_SITE_OTHER): Payer: Medicaid Other | Admitting: Cardiology

## 2015-01-04 VITALS — BP 144/82 | HR 106 | Resp 20

## 2015-01-04 DIAGNOSIS — I255 Ischemic cardiomyopathy: Secondary | ICD-10-CM

## 2015-01-04 DIAGNOSIS — E785 Hyperlipidemia, unspecified: Secondary | ICD-10-CM

## 2015-01-04 DIAGNOSIS — R0789 Other chest pain: Secondary | ICD-10-CM | POA: Diagnosis not present

## 2015-01-04 DIAGNOSIS — I2510 Atherosclerotic heart disease of native coronary artery without angina pectoris: Secondary | ICD-10-CM

## 2015-01-04 DIAGNOSIS — I200 Unstable angina: Secondary | ICD-10-CM

## 2015-01-04 DIAGNOSIS — I10 Essential (primary) hypertension: Secondary | ICD-10-CM

## 2015-01-04 DIAGNOSIS — I340 Nonrheumatic mitral (valve) insufficiency: Secondary | ICD-10-CM

## 2015-01-04 MED ORDER — ISOSORBIDE MONONITRATE ER 30 MG PO TB24: 30.0000 mg | ORAL_TABLET | Freq: Two times a day (BID) | ORAL | Status: DC

## 2015-01-04 MED ORDER — NITROGLYCERIN 0.4 MG SL SUBL: 0.4000 mg | SUBLINGUAL_TABLET | SUBLINGUAL | Status: DC | PRN

## 2015-01-04 NOTE — Patient Instructions (Signed)
Medication Instructions:  Your physician has recommended you make the following change in your medication:  1- Increase Imdur 30 mg by mouth twice daily.  Labwork: NONE  Testing/Procedures: NONE  Follow-Up: Your physician recommends that you schedule a follow-up appointment in: 3 to 4 weeks with Dr. Eldridge Dace.

## 2015-01-04 NOTE — Assessment & Plan Note (Signed)
LDL > 200 in March 2016. Now on high dose statin.

## 2015-01-04 NOTE — Assessment & Plan Note (Signed)
Known severe CAD, declined CABG

## 2015-01-04 NOTE — Assessment & Plan Note (Signed)
Pt walked into the office today to pay a bill and had chest pain. Her EKG suggested a STEMI.
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
## 2015-01-04 NOTE — Progress Notes (Signed)
Patient was a walk-in complaining of chest pain, weakness, and SOB with activity. Patient states her chest pain radiates to her back and down her arms to her elbows. Patient has had a few stressors this am, that has not helped her recent events. Patient states this has been going on for a few weeks. Patient had an EKG and VS taken.  VS are BP 144/82, HR 106, Resp. 20, and  O2 99%.  Patient took one of her own nitroglycerin tabs while in office. EKG seen by Corine Shelter PA and Dr. Zola Button. Patient was advised to go to ED to be observed over night. Patient did not want to go, but agreed to an increase in her Imdur and a follow-up appointment with Dr. Eldridge Dace.

## 2015-01-04 NOTE — Assessment & Plan Note (Signed)
EF 40-45% 

## 2015-01-04 NOTE — Assessment & Plan Note (Signed)
Controlled.  

## 2015-01-22 ENCOUNTER — Telehealth: Payer: Self-pay

## 2015-01-22 ENCOUNTER — Ambulatory Visit (INDEPENDENT_AMBULATORY_CARE_PROVIDER_SITE_OTHER): Payer: Medicaid Other | Admitting: Interventional Cardiology

## 2015-01-22 ENCOUNTER — Encounter: Payer: Self-pay | Admitting: Interventional Cardiology

## 2015-01-22 VITALS — BP 114/70 | HR 83 | Ht 60.0 in | Wt 102.2 lb

## 2015-01-22 DIAGNOSIS — E785 Hyperlipidemia, unspecified: Secondary | ICD-10-CM

## 2015-01-22 DIAGNOSIS — I200 Unstable angina: Secondary | ICD-10-CM

## 2015-01-22 DIAGNOSIS — I2510 Atherosclerotic heart disease of native coronary artery without angina pectoris: Secondary | ICD-10-CM

## 2015-01-22 DIAGNOSIS — R0789 Other chest pain: Secondary | ICD-10-CM | POA: Diagnosis not present

## 2015-01-22 NOTE — Progress Notes (Signed)
Patient ID: Breanna Alvarez, female   DOB: 07/30/1950, 64 y.o.   MRN: [id number]     Cardiology Office Note   Date:  01/22/2015   ID:  Breanna Alvarez, DOB 04-Dec-1950, MRN [id number]  PCP:  No PCP Per Patient    No chief complaint on file. f/u CAD, unstable angina   Wt Readings from Last 3 Encounters:  01/22/15 102 lb 3.2 oz (46.358 kg)  10/01/14 104 lb 12.8 oz (47.537 kg)  08/10/14 106 lb (48.081 kg)       History of Present Illness: Breanna Alvarez is a 64 y.o. female  Who had a NSTEMI in early 2016. She was diagnosed with multivessel CAD and CABG was recommended. She declined CABG and had been trying to change her diet. She states she is taking antioxidants. She is trying to eat less concentrated sugars and less fried foods.  She was juicing as well. She has had some issues with depression as well. She is also trying natural solutions to these rather than prescription medicines. She was not interested in bypass surgery for the past 6 months.  She now admits to using nitroglycerin daily. The pain is very severe. She came to the office couple of weeks ago. Her Imdur was increased. The husband states that she is not taking medications necessarily as prescribed. She does admit that the pain is becoming unbearable at times. She has thought she was going to die at times. She has not gone to the emergency room. It is been recommended several times in the past including a few weeks ago when she was seen by Corine Shelter. She has refused to go to the ER.  She last used nitroglycerin yesterday.        Past Medical History  Diagnosis Date  . Hyperlipidemia   . Acute systolic CHF (congestive heart failure), NYHA class 3 07/10/2014  . Hypertension     Past Surgical History  Procedure Laterality Date  . No past surgeries    . Left heart catheterization with coronary angiogram N/A 07/13/2014    Procedure: LEFT HEART CATHETERIZATION WITH CORONARY ANGIOGRAM;  Surgeon: Corky Crafts, MD; mLAD 95%, dLAD 95%, D1 95%, CFX severe dz, mRCA 80%     Current Outpatient Prescriptions  Medication Sig Dispense Refill  . aspirin 81 MG chewable tablet Chew 1 tablet (81 mg total) by mouth daily. 30 tablet 11  . isosorbide mononitrate (IMDUR) 30 MG 24 hr tablet Take 1 tablet (30 mg total) by mouth 2 (two) times daily. 60 tablet 11  . lisinopril (PRINIVIL,ZESTRIL) 2.5 MG tablet Take 1 tablet (2.5 mg total) by mouth daily. 30 tablet 11  . metoprolol (LOPRESSOR) 50 MG tablet Take 1 tablet (50 mg total) by mouth 2 (two) times daily. 180 tablet 3  . nitroGLYCERIN (NITROSTAT) 0.4 MG SL tablet Place 1 tablet (0.4 mg total) under the tongue every 5 (five) minutes as needed for chest pain. 25 tablet 2  . pravastatin (PRAVACHOL) 80 MG tablet Take 1 tablet (80 mg total) by mouth every evening. 30 tablet 3   No current facility-administered medications for this visit.    Allergies:   Review of patient's allergies indicates no known allergies.    Social History:  The patient  reports that she has never smoked. She has never used smokeless tobacco. She reports that she does not drink alcohol or use illicit drugs.   Family History:  The patient's family history includes Heart attack in her mother; Stroke  in her father.    ROS:  Please see the history of present illness.   Otherwise, review of systems are positive for frequent chest pain, even at rest, increasing in frequency.   All other systems are reviewed and negative.    PHYSICAL EXAM: VS:  BP 114/70 mmHg  Pulse 83  Ht 5' (1.524 m)  Wt 102 lb 3.2 oz (46.358 kg)  BMI 19.96 kg/m2 , BMI Body mass index is 19.96 kg/(m^2). GEN: Well nourished, well developed, in no acute distress HEENT: normal Neck: no JVD, carotid bruits, or masses Cardiac: RRR; no murmurs, rubs, or gallops,no edema  Respiratory:  clear to auscultation bilaterally, normal work of breathing GI: soft, nontender, nondistended, + BS MS: no deformity or  atrophy Skin: warm and dry, no rash Neuro:  Strength and sensation are intact Psych: euthymic mood, full affect   EKG:   The ekg ordered today demonstrates normal sinus rhythm, anterior ST segment changes. Lateral ST segment depressions   Recent Labs: 07/10/2014: B Natriuretic Peptide 1059.1* 07/14/2014: Hemoglobin 9.8*; Platelets 370; TSH 3.572 10/01/2014: ALT 9; BUN 26*; Creatinine, Ser 0.93; Potassium 4.3; Sodium 140   Lipid Panel    Component Value Date/Time   CHOL 337* 10/01/2014 1022   TRIG 79.0 10/01/2014 1022   HDL 28.10* 10/01/2014 1022   CHOLHDL 12 10/01/2014 1022   VLDL 15.8 10/01/2014 1022   LDLCALC 293* 10/01/2014 1022     Other studies Reviewed: Additional studies/ records that were reviewed today with results demonstrating: I personally went over the angiogram images with the patient, her husband during the visit. Of note, he has an image of the diagram that was drawn for him. He stated that he would not base a big surgery on a "child's drawing." I explained to him that the drawing was based on the angiogram. He stated that 'we know some doctors, lawyers, policeman are not trustworthy. I am not going to trust a child's drawing to decide about a major surgery.'  He then went on to say that he did not know me well enough to know whether he could trust my advice. This is why I logged into the [facility] system to actually pull up the images and show him the pictures. Once we did this, he was in agreement that there was severe multivessel disease.   ASSESSMENT AND PLAN:  1. CAD: She is having unstable angina. I strongly recommended that she be admitted to the hospital from the office. Initially, she agreed because her symptoms have been so severe. We spoke at length about how this is a mechanical problem and that there are no medical therapies that will dissolve the blockages and increased blood flow. Stenting of the LAD would be very difficult because of the 2 large branches that  come off at the occlusion. She would likely lose a branch and have a significant periprocedural heart attack.  Based on the angiogram, they both understood this. After the wife agreed to go into the hospital, the husband expressed displeasure. He felt that she could wait at least another week. They could get affairs at home in order. He also had a job interview on Monday that he was concerned about if she was actually in the hospital. He felt it was unfair to him for her to make this decision and to go to the hospital.  She had been seen by Dr. Zenaida Niece tried in the past regarding bypass surgery. She was willing to have the surgery with him at this  time. Questions were answered about length of surgery and length of hospital stay. Of note, it is an odd dynamic when talking to the patient's husband.  I again reiterated that she could have a massive heart attack or die if she waits. I've recommended that she go to the hospital now and that if something happened, she could be cared for appropriately.  They understand this risk. They feel like she has been taking this risk for the last 6 months so another week will not hurt. They do agree to call back when she is willing to go into the hospital for consideration of surgery. We stated emphatically that if she has any more chest discomfort, she should call 911 and go to the hospital immediately. I spent 40 minutes in the room talking to the patient and her husband about her serious health condition. 2. Hyperlipidemia: Continue statin.   LDL 70.  3. Family history of heart disease: Her mother had an MI. Her father had a stroke.   Current medicines are reviewed at length with the patient today.  The patient concerns regarding her medicines were addressed.  The following changes have been made:  No change  Labs/ tests ordered today include:  Orders Placed This Encounter  Procedures  . EKG 12-Lead    Recommend 150 minutes/week of aerobic exercise Low fat, low  carb, high fiber diet recommended  Disposition:   FU for admission to the hospital. She will call the office and we will try to arrange a direct admit. If she does have more chest discomfort, I have explained to her that she should go via 911 EMS to the emergency room.   Delorise Jackson., MD  01/22/2015 11:14 AM    [facility] [address] [place], [place], [place]  [address] Phone: [phone]; Fax: [phone]

## 2015-01-22 NOTE — Telephone Encounter (Signed)
Breanna Alvarez called our office at 4pm on Friday 01/22/15 wanting to come and see Dr. Maren Beach this afternoon or this weekend regarding her chest pain.  Today she had an o/v with Dr. Eldridge Dace regarding the same issue.  His advice was to go to the emergency room.  Breanna Alvarez was informed that Dr. Maren Beach is not in the office today or over the weekend, however we do always have a physician on call at the hospital if she needs it.  She refused to present to the ER for her chest pain and refused to make an appointment to see Dr. Maren Beach.  We advised her if her chest pain increased to call 911 or go directly to the ER to be evaluated.  The patient was very non compliant in understanding the importance of her medical condition.

## 2015-01-22 NOTE — Patient Instructions (Addendum)
**Note De-Identified  Obfuscation** Medication Instructions:  Same  Labwork: None  Testing/Procedures: None  Please call us at [phone] when you make decision about surgery. If you have persistent and/or worsening chest pain please call 911 or have someone drive you (Do not drive yourself) to the closest ER.

## 2015-01-25 ENCOUNTER — Telehealth: Payer: Self-pay | Admitting: Interventional Cardiology

## 2015-01-25 NOTE — Telephone Encounter (Signed)
THis patient who refused CABG with Dr. Donata Clay is now agreeable.  Direct admitting tomorrow.  Can you arrange for re- consult?

## 2015-01-25 NOTE — Telephone Encounter (Signed)
I spoke with Breanna Alvarez and admission arranged for telemetry bed at [facility] tomorrow.  Admitting will contact pt tomorrow with bed assignment. I spoke with pt and told her admitting would contact her tomorrow with room number.

## 2015-01-25 NOTE — Telephone Encounter (Signed)
New message     Pt daughter states pt has decided to move forward will having surgery Please call to discuss/schedule

## 2015-01-25 NOTE — Telephone Encounter (Signed)
I reviewed with Dr. Eldridge Dace and if pt is having chest pain she should go to ED.  If not having chest pain she can be admitted to a telemetry bed and the surgeons will be consulted.  I do not see Aurea Graff listed as person we may talk to about pt.  I called and spoke with pt who gave me verbal permission to speak with her daughter Tawni Carnes).  I spoke with Tawni Carnes who reports pt would like to proceed with surgery.  Pt is not having chest pain at this time. Tawni Carnes states pt can be admitted tomorrow.  Will arrange admission and call back.

## 2015-01-26 ENCOUNTER — Other Ambulatory Visit: Payer: Self-pay | Admitting: *Deleted

## 2015-01-26 ENCOUNTER — Inpatient Hospital Stay (HOSPITAL_COMMUNITY)
Admission: AD | Admit: 2015-01-26 | Discharge: 2015-03-15 | DRG: 003 | Disposition: A | Discharge status: Skilled Nursing Facility | Payer: Medicaid Other | Source: Ambulatory Visit | Attending: Cardiothoracic Surgery | Admitting: Cardiothoracic Surgery

## 2015-01-26 ENCOUNTER — Encounter (HOSPITAL_COMMUNITY): Payer: Self-pay

## 2015-01-26 DIAGNOSIS — R339 Retention of urine, unspecified: Secondary | ICD-10-CM | POA: Diagnosis not present

## 2015-01-26 DIAGNOSIS — I340 Nonrheumatic mitral (valve) insufficiency: Secondary | ICD-10-CM | POA: Diagnosis present

## 2015-01-26 DIAGNOSIS — J9600 Acute respiratory failure, unspecified whether with hypoxia or hypercapnia: Secondary | ICD-10-CM | POA: Insufficient documentation

## 2015-01-26 DIAGNOSIS — Z8249 Family history of ischemic heart disease and other diseases of the circulatory system: Secondary | ICD-10-CM | POA: Diagnosis not present

## 2015-01-26 DIAGNOSIS — I5023 Acute on chronic systolic (congestive) heart failure: Secondary | ICD-10-CM | POA: Insufficient documentation

## 2015-01-26 DIAGNOSIS — E871 Hypo-osmolality and hyponatremia: Secondary | ICD-10-CM | POA: Diagnosis not present

## 2015-01-26 DIAGNOSIS — Z9911 Dependence on respirator [ventilator] status: Secondary | ICD-10-CM

## 2015-01-26 DIAGNOSIS — D696 Thrombocytopenia, unspecified: Secondary | ICD-10-CM | POA: Diagnosis not present

## 2015-01-26 DIAGNOSIS — T17890A Other foreign object in other parts of respiratory tract causing asphyxiation, initial encounter: Secondary | ICD-10-CM | POA: Diagnosis not present

## 2015-01-26 DIAGNOSIS — K567 Ileus, unspecified: Secondary | ICD-10-CM | POA: Diagnosis not present

## 2015-01-26 DIAGNOSIS — T814XXA Infection following a procedure, initial encounter: Secondary | ICD-10-CM | POA: Diagnosis not present

## 2015-01-26 DIAGNOSIS — E872 Acidosis: Secondary | ICD-10-CM | POA: Diagnosis not present

## 2015-01-26 DIAGNOSIS — J690 Pneumonitis due to inhalation of food and vomit: Secondary | ICD-10-CM | POA: Diagnosis not present

## 2015-01-26 DIAGNOSIS — I509 Heart failure, unspecified: Secondary | ICD-10-CM

## 2015-01-26 DIAGNOSIS — R0602 Shortness of breath: Secondary | ICD-10-CM

## 2015-01-26 DIAGNOSIS — Z681 Body mass index (BMI) 19 or less, adult: Secondary | ICD-10-CM

## 2015-01-26 DIAGNOSIS — A419 Sepsis, unspecified organism: Secondary | ICD-10-CM | POA: Diagnosis not present

## 2015-01-26 DIAGNOSIS — K7200 Acute and subacute hepatic failure without coma: Secondary | ICD-10-CM | POA: Diagnosis not present

## 2015-01-26 DIAGNOSIS — E870 Hyperosmolality and hypernatremia: Secondary | ICD-10-CM | POA: Diagnosis not present

## 2015-01-26 DIAGNOSIS — I5021 Acute systolic (congestive) heart failure: Secondary | ICD-10-CM | POA: Diagnosis not present

## 2015-01-26 DIAGNOSIS — R570 Cardiogenic shock: Secondary | ICD-10-CM | POA: Diagnosis not present

## 2015-01-26 DIAGNOSIS — E46 Unspecified protein-calorie malnutrition: Secondary | ICD-10-CM | POA: Diagnosis not present

## 2015-01-26 DIAGNOSIS — E785 Hyperlipidemia, unspecified: Secondary | ICD-10-CM | POA: Diagnosis present

## 2015-01-26 DIAGNOSIS — J9621 Acute and chronic respiratory failure with hypoxia: Secondary | ICD-10-CM | POA: Diagnosis not present

## 2015-01-26 DIAGNOSIS — I080 Rheumatic disorders of both mitral and aortic valves: Secondary | ICD-10-CM | POA: Diagnosis present

## 2015-01-26 DIAGNOSIS — J80 Acute respiratory distress syndrome: Secondary | ICD-10-CM | POA: Diagnosis not present

## 2015-01-26 DIAGNOSIS — N179 Acute kidney failure, unspecified: Secondary | ICD-10-CM | POA: Diagnosis not present

## 2015-01-26 DIAGNOSIS — I110 Hypertensive heart disease with heart failure: Secondary | ICD-10-CM | POA: Diagnosis present

## 2015-01-26 DIAGNOSIS — F329 Major depressive disorder, single episode, unspecified: Secondary | ICD-10-CM | POA: Diagnosis present

## 2015-01-26 DIAGNOSIS — J9811 Atelectasis: Secondary | ICD-10-CM | POA: Insufficient documentation

## 2015-01-26 DIAGNOSIS — J9620 Acute and chronic respiratory failure, unspecified whether with hypoxia or hypercapnia: Secondary | ICD-10-CM | POA: Diagnosis not present

## 2015-01-26 DIAGNOSIS — R0603 Acute respiratory distress: Secondary | ICD-10-CM

## 2015-01-26 DIAGNOSIS — J811 Chronic pulmonary edema: Secondary | ICD-10-CM

## 2015-01-26 DIAGNOSIS — I25110 Atherosclerotic heart disease of native coronary artery with unstable angina pectoris: Secondary | ICD-10-CM | POA: Diagnosis not present

## 2015-01-26 DIAGNOSIS — I252 Old myocardial infarction: Secondary | ICD-10-CM | POA: Diagnosis not present

## 2015-01-26 DIAGNOSIS — Z978 Presence of other specified devices: Secondary | ICD-10-CM

## 2015-01-26 DIAGNOSIS — J9690 Respiratory failure, unspecified, unspecified whether with hypoxia or hypercapnia: Secondary | ICD-10-CM

## 2015-01-26 DIAGNOSIS — Z7982 Long term (current) use of aspirin: Secondary | ICD-10-CM | POA: Diagnosis not present

## 2015-01-26 DIAGNOSIS — E440 Moderate protein-calorie malnutrition: Secondary | ICD-10-CM | POA: Insufficient documentation

## 2015-01-26 DIAGNOSIS — I2510 Atherosclerotic heart disease of native coronary artery without angina pectoris: Secondary | ICD-10-CM | POA: Diagnosis present

## 2015-01-26 DIAGNOSIS — R410 Disorientation, unspecified: Secondary | ICD-10-CM | POA: Diagnosis not present

## 2015-01-26 DIAGNOSIS — R079 Chest pain, unspecified: Secondary | ICD-10-CM | POA: Diagnosis present

## 2015-01-26 DIAGNOSIS — J189 Pneumonia, unspecified organism: Secondary | ICD-10-CM | POA: Diagnosis not present

## 2015-01-26 DIAGNOSIS — I200 Unstable angina: Secondary | ICD-10-CM | POA: Diagnosis present

## 2015-01-26 DIAGNOSIS — Z951 Presence of aortocoronary bypass graft: Secondary | ICD-10-CM | POA: Diagnosis not present

## 2015-01-26 DIAGNOSIS — Z01810 Encounter for preprocedural cardiovascular examination: Secondary | ICD-10-CM | POA: Diagnosis not present

## 2015-01-26 DIAGNOSIS — I214 Non-ST elevation (NSTEMI) myocardial infarction: Secondary | ICD-10-CM | POA: Diagnosis present

## 2015-01-26 DIAGNOSIS — T17998A Other foreign object in respiratory tract, part unspecified causing other injury, initial encounter: Secondary | ICD-10-CM | POA: Diagnosis not present

## 2015-01-26 DIAGNOSIS — Z95811 Presence of heart assist device: Secondary | ICD-10-CM

## 2015-01-26 DIAGNOSIS — R739 Hyperglycemia, unspecified: Secondary | ICD-10-CM | POA: Diagnosis not present

## 2015-01-26 DIAGNOSIS — I471 Supraventricular tachycardia: Secondary | ICD-10-CM | POA: Diagnosis not present

## 2015-01-26 DIAGNOSIS — I5043 Acute on chronic combined systolic (congestive) and diastolic (congestive) heart failure: Secondary | ICD-10-CM | POA: Diagnosis not present

## 2015-01-26 DIAGNOSIS — J9601 Acute respiratory failure with hypoxia: Secondary | ICD-10-CM | POA: Diagnosis not present

## 2015-01-26 DIAGNOSIS — Z4659 Encounter for fitting and adjustment of other gastrointestinal appliance and device: Secondary | ICD-10-CM

## 2015-01-26 DIAGNOSIS — E876 Hypokalemia: Secondary | ICD-10-CM | POA: Diagnosis not present

## 2015-01-26 DIAGNOSIS — R1310 Dysphagia, unspecified: Secondary | ICD-10-CM | POA: Diagnosis not present

## 2015-01-26 DIAGNOSIS — I5022 Chronic systolic (congestive) heart failure: Secondary | ICD-10-CM | POA: Diagnosis not present

## 2015-01-26 DIAGNOSIS — I255 Ischemic cardiomyopathy: Secondary | ICD-10-CM | POA: Diagnosis present

## 2015-01-26 DIAGNOSIS — T17908A Unspecified foreign body in respiratory tract, part unspecified causing other injury, initial encounter: Secondary | ICD-10-CM | POA: Insufficient documentation

## 2015-01-26 DIAGNOSIS — D62 Acute posthemorrhagic anemia: Secondary | ICD-10-CM | POA: Diagnosis not present

## 2015-01-26 DIAGNOSIS — Z930 Tracheostomy status: Secondary | ICD-10-CM | POA: Diagnosis not present

## 2015-01-26 DIAGNOSIS — Z823 Family history of stroke: Secondary | ICD-10-CM

## 2015-01-26 DIAGNOSIS — I10 Essential (primary) hypertension: Secondary | ICD-10-CM | POA: Diagnosis present

## 2015-01-26 DIAGNOSIS — Z9889 Other specified postprocedural states: Secondary | ICD-10-CM

## 2015-01-26 DIAGNOSIS — Z419 Encounter for procedure for purposes other than remedying health state, unspecified: Secondary | ICD-10-CM

## 2015-01-26 DIAGNOSIS — Z9289 Personal history of other medical treatment: Secondary | ICD-10-CM

## 2015-01-26 DIAGNOSIS — Z9689 Presence of other specified functional implants: Secondary | ICD-10-CM

## 2015-01-26 LAB — BASIC METABOLIC PANEL
ANION GAP: 9 (ref 5–15)
BUN: 21 mg/dL — ABNORMAL HIGH (ref 6–20)
CHLORIDE: 106 mmol/L (ref 101–111)
CO2: 24 mmol/L (ref 22–32)
Calcium: 9.2 mg/dL (ref 8.9–10.3)
Creatinine, Ser: 1.11 mg/dL — ABNORMAL HIGH (ref 0.44–1.00)
GFR calc Af Amer: >60 mL/min (ref >60)
GFR, EST NON AFRICAN AMERICAN: 52 mL/min — AB (ref >60)
Glucose, Bld: 110 mg/dL — ABNORMAL HIGH (ref 65–99)
POTASSIUM: 4.1 mmol/L (ref 3.5–5.1)
SODIUM: 139 mmol/L (ref 135–145)

## 2015-01-26 LAB — TROPONIN I: TROPONIN I: 0.14 ng/mL — AB (ref <0.031)

## 2015-01-26 LAB — CBC
HEMATOCRIT: 32.4 % — AB (ref 36.0–46.0)
HEMOGLOBIN: 10.3 g/dL — AB (ref 12.0–15.0)
MCH: 26 pg (ref 26.0–34.0)
MCHC: 31.8 g/dL (ref 30.0–36.0)
MCV: 81.8 fL (ref 78.0–100.0)
Platelets: 349 10*3/uL (ref 150–400)
RBC: 3.96 MIL/uL (ref 3.87–5.11)
RDW: 13.7 % (ref 11.5–15.5)
WBC: 9.9 10*3/uL (ref 4.0–10.5)

## 2015-01-26 LAB — HEPARIN LEVEL (UNFRACTIONATED): Heparin Unfractionated: 0.32 IU/mL (ref 0.30–0.70)

## 2015-01-26 MED ORDER — HEPARIN BOLUS VIA INFUSION
2500.0000 [IU] | Freq: Once | INTRAVENOUS | Status: AC
Start: 2015-01-26 — End: 2015-01-26
  Administered 2015-01-26: 2500 [IU] via INTRAVENOUS
  Filled 2015-01-26: qty 2500

## 2015-01-26 MED ORDER — PRAVASTATIN SODIUM 40 MG PO TABS: 80.0000 mg | ORAL_TABLET | Freq: Every evening | ORAL | Status: DC

## 2015-01-26 MED ORDER — ISOSORBIDE MONONITRATE ER 30 MG PO TB24
30.0000 mg | ORAL_TABLET | Freq: Two times a day (BID) | ORAL | Status: DC
  Administered 2015-01-26 – 2015-01-31 (×11): 30 mg via ORAL
  Filled 2015-01-26 (×11): qty 1

## 2015-01-26 MED ORDER — ASPIRIN 81 MG PO CHEW
81.0000 mg | CHEWABLE_TABLET | Freq: Every day | ORAL | Status: DC
  Administered 2015-01-27 – 2015-01-31 (×5): 81 mg via ORAL
  Filled 2015-01-26 (×5): qty 1

## 2015-01-26 MED ORDER — HEPARIN (PORCINE) IN NACL 100-0.45 UNIT/ML-% IJ SOLN
900.0000 [IU]/h | INTRAMUSCULAR | Status: DC
  Administered 2015-01-26: 700 [IU]/h via INTRAVENOUS
  Administered 2015-01-27: 800 [IU]/h via INTRAVENOUS
  Administered 2015-01-29 – 2015-02-01 (×2): 900 [IU]/h via INTRAVENOUS
  Filled 2015-01-26 (×5): qty 250

## 2015-01-26 MED ORDER — ONDANSETRON HCL 4 MG/2ML IJ SOLN: 4.0000 mg | Freq: Four times a day (QID) | INTRAMUSCULAR | Status: DC | PRN

## 2015-01-26 MED ORDER — METOPROLOL TARTRATE 50 MG PO TABS
50.0000 mg | ORAL_TABLET | Freq: Two times a day (BID) | ORAL | Status: DC
  Administered 2015-01-26 – 2015-01-30 (×8): 50 mg via ORAL
  Filled 2015-01-26 (×8): qty 1

## 2015-01-26 MED ORDER — NITROGLYCERIN 0.4 MG SL SUBL: 0.4000 mg | SUBLINGUAL_TABLET | SUBLINGUAL | Status: DC | PRN

## 2015-01-26 MED ORDER — PRAVASTATIN SODIUM 40 MG PO TABS
80.0000 mg | ORAL_TABLET | Freq: Every day | ORAL | Status: DC
  Administered 2015-01-27 – 2015-01-28 (×2): 80 mg via ORAL
  Filled 2015-01-26 (×2): qty 2

## 2015-01-26 MED ORDER — ACETAMINOPHEN 325 MG PO TABS
650.0000 mg | ORAL_TABLET | ORAL | Status: DC | PRN
  Filled 2015-01-26: qty 2

## 2015-01-26 MED ORDER — LISINOPRIL 2.5 MG PO TABS
2.5000 mg | ORAL_TABLET | Freq: Every day | ORAL | Status: DC
  Administered 2015-01-27 – 2015-01-31 (×5): 2.5 mg via ORAL
  Filled 2015-01-26 (×5): qty 1

## 2015-01-26 NOTE — Progress Notes (Signed)
ANTICOAGULATION CONSULT NOTE - Initial Consult  Pharmacy Consult for Heparin Indication: chest pain/ACS  No Known Allergies  Patient Measurements: Weight: 100 lb 1.4 oz (45.4 kg)  Vital Signs: Temp: 97.7 F (36.5 C) (09/20 1504) Temp Source: Oral (09/20 1504) BP: 140/73 mmHg (09/20 1504) Pulse Rate: 94 (09/20 1504)  Labs: No results for input(s): HGB, HCT, PLT, APTT, LABPROT, INR, HEPARINUNFRC, CREATININE, CKTOTAL, CKMB, TROPONINI in the last 72 hours.  CrCl cannot be calculated (Patient has no serum creatinine result on file.).   Medical History: Past Medical History  Diagnosis Date  . Hyperlipidemia   . Acute systolic CHF (congestive heart failure), NYHA class 3 07/10/2014  . Hypertension     Assessment: 64 year old female s/p NSTEMI in early 2016 diagnosed with multivessel disease CAD and CABG was recommended.  She declined CABG at that time.  Presents today as a direct admit with chest pain.  Pharmacy asked to begin heparin.  Goal of Therapy:  Heparin level 0.3-0.7 units/ml Monitor platelets by anticoagulation protocol: Yes   Plan:  Heparin 2500 units iv bolus x 1  Heparin drip at 700 units / hr Heparin level 6 hours after heparin starts Daily heparin level, CBC  Thank you Okey Regal, PharmD [phone]  01/26/2015,3:53 PM

## 2015-01-26 NOTE — H&P (Signed)
Cardiologist:  Breanna Alvarez is an 64 y.o. female.   Chief Complaint: Chest pain HPI:  Breanna Alvarez is a 64 y.o. femalewho had a NSTEMI in early 2016. She was diagnosed with multivessel CAD and CABG was recommended. She declined CABG and had been trying to change her diet. She states she is taking antioxidants. She is trying to eat less concentrated sugars and less fried foods. She was juicing as well. She has had some issues with depression as well. She is also trying natural solutions to these rather than prescription medicines. She was not interested in bypass surgery for the past 6 months.   She was seen in the office on Sept 16  And admitted to using nitroglycerin daily. The pain is very severe. She came to the office couple of weeks ago. Her Imdur was increased. The husband states that she is not taking medications necessarily as prescribed. She does admit that the pain is becoming unbearable at times. She has thought she was going to die at times. She has not gone to the emergency room. It is been recommended several times in the past including a few weeks ago when she was seen by [facility]. She has refused to go to the ER.  She presents as a direct admit.  She is currently chest pain free while at rest but has CP with ambulation.  She reports occasional cough.  The patient currently denies nausea, vomiting, fever, shortness of breath, orthopnea, dizziness, PND, abdominal pain, hematochezia, melena, lower extremity edema.   Past Medical History  Diagnosis Date  . Hyperlipidemia   . Acute systolic CHF (congestive heart failure), NYHA class 3 07/10/2014  . Hypertension     Past Surgical History  Procedure Laterality Date  . No past surgeries    . Left heart catheterization with coronary angiogram N/A 07/13/2014    Procedure: LEFT HEART CATHETERIZATION WITH CORONARY ANGIOGRAM;  Surgeon: Corky Crafts, MD; mLAD 95%, dLAD 95%, D1 95%, CFX severe dz, mRCA 80%     Family History  Problem Relation Age of Onset  . Heart attack Mother     MI when she was 60-70s, died at age 77  . Stroke Father     died when patient was 44 years old   Social History:  reports that she has never smoked. She has never used smokeless tobacco. She reports that she does not drink alcohol or use illicit drugs.  Allergies: No Known Allergies  Medications Prior to Admission  Medication Sig Dispense Refill  . aspirin 81 MG chewable tablet Chew 1 tablet (81 mg total) by mouth daily. 30 tablet 11  . isosorbide mononitrate (IMDUR) 30 MG 24 hr tablet Take 1 tablet (30 mg total) by mouth 2 (two) times daily. 60 tablet 11  . lisinopril (PRINIVIL,ZESTRIL) 2.5 MG tablet Take 1 tablet (2.5 mg total) by mouth daily. 30 tablet 11  . metoprolol (LOPRESSOR) 50 MG tablet Take 1 tablet (50 mg total) by mouth 2 (two) times daily. 180 tablet 3  . nitroGLYCERIN (NITROSTAT) 0.4 MG SL tablet Place 1 tablet (0.4 mg total) under the tongue every 5 (five) minutes as needed for chest pain. 25 tablet 2  . pravastatin (PRAVACHOL) 80 MG tablet Take 1 tablet (80 mg total) by mouth every evening. 30 tablet 3    No results found for this or any previous visit (from the past 48 hour(s)). No results found.  Review of Systems  Constitutional: Negative for fever.  HENT: Negative for congestion and sore throat.   Respiratory: Positive for cough. Negative for shortness of breath.   Cardiovascular: Positive for chest pain. Negative for orthopnea, leg swelling and PND.  Gastrointestinal: Negative for nausea, vomiting, abdominal pain, blood in stool and melena.  Musculoskeletal: Negative for myalgias.  Neurological: Negative for dizziness.  All other systems reviewed and are negative.   Blood pressure 140/73, pulse 94, temperature 97.7 F (36.5 C), temperature source Oral, resp. rate 18, weight 100 lb 1.4 oz (45.4 kg), SpO2 100 %. Physical Exam  Well nourished, well developed, in no acute  distress HEENT: Pupils are equal round react to light accommodation extraocular movements are intact.  Neck: no JVDNo cervical lymphadenopathy. Cardiac: Regular rate and rhythm without murmurs rubs or gallops. Lungs:  clear to auscultation bilaterally, no wheezing, rhonchi or rales Abd: soft, nontender, positive bowel sounds all quadrants, no hepatosplenomegaly Ext: no lower extremity edema.  2+ radial and 1+ dorsalis pedis pulses. Skin: warm and dry Neuro:  Grossly normal  Assessment/Plan Active Problems:   Chronic systolic heart failure   CAD, multiple vessel   Moderate mitral regurgitation   Hyperlipidemia LDL goal <70   Essential hypertension   Unstable angina   Cardiomyopathy, ischemic  CTS is working on scheduling preop workup and by-pass surgery.  Strict bedrest.  Will add IV heparin given the severity of her symptoms and check troponin once.  Currently pain free.  LABS:  CBC, BMET, Lipids.  She appears euvolemic. Strict I/Os.      Wilburt Finlay, PAC 01/26/2015, 3:21 PM   I have examined the patient and reviewed assessment and plan and discussed with patient.  Agree with above as stated.  Patient with severe 3 VD.  Now agreeable to surgery.  She declined in the past.  Please see my most recent office note.  Patient with sx of chest pain with minimal activity.  Using SL NTG daily. Awaiting word from Dr. Donata Clay as to when surgery could be performed.     VARANASI,JAYADEEP S.

## 2015-01-26 NOTE — Progress Notes (Signed)
New pt direct admission. Pt arrived to the floor in stable condition. Vitals taken. Initial Assessment done. All immediate pertinent needs to patient addressed. Patient Guide given to patient. Important safety instructions relating to hospitalization reviewed with patient. Patient verbalized understanding. Will continue to monitor pt.  Bethany Scherer RN 

## 2015-01-26 NOTE — Progress Notes (Signed)
Procedure(s) (LRB): CORONARY ARTERY BYPASS GRAFTING (CABG) (N/A) MITRAL VALVE REPAIR (MVR) (N/A) TRANSESOPHAGEAL ECHOCARDIOGRAM (TEE) (N/A) Subjective: Patient examined and coronary angiogram from earlier this year reviewed, most recent echocardiogram reviewed She presents now with unstable angina and positive cardiac enzymes consistent with subendocardial MI. She is currently comfortable on IV heparin. Plan on scheduling multivessel CABG on first available OR date-probably early next week.  Objective: Vital signs in last 24 hours: Temp:  [97.7 F (36.5 C)] 97.7 F (36.5 C) (09/20 1504) Pulse Rate:  [94] 94 (09/20 1504) Cardiac Rhythm:  [-] Sinus tachycardia (09/20 1500) Resp:  [18] 18 (09/20 1504) BP: (140)/(73) 140/73 mmHg (09/20 1504) SpO2:  [100 %] 100 % (09/20 1504) Weight:  [100 lb 1.4 oz (45.4 kg)] 100 lb 1.4 oz (45.4 kg) (09/20 1449)  Hemodynamic parameters for last 24 hours:   sinus tachycardia  Intake/Output from previous day:   Intake/Output this shift:    Exam [place] female no acute distress Lungs distant breath sounds Heart rate rapid without murmur No peripheral edema Neuro intact  Lab Results:  Recent Labs  01/26/15 1650  WBC 9.9  HGB 10.3*  HCT 32.4*  PLT 349   BMET:  Recent Labs  01/26/15 1650  NA 139  K 4.1  CL 106  CO2 24  GLUCOSE 110*  BUN 21*  CREATININE 1.11*  CALCIUM 9.2    PT/INR: No results for input(s): LABPROT, INR in the last 72 hours. ABG    Component Value Date/Time   PHART 7.440 07/13/2014 0800   HCO3 25.8* 07/13/2014 0801   TCO2 27 07/13/2014 0801   ACIDBASEDEF 1.0 07/13/2014 0800   O2SAT 63.0 07/13/2014 0801   CBG (last 3)  No results for input(s): GLUCAP in the last 72 hours.  Assessment/Plan: S/P Procedure(s) (LRB): CORONARY ARTERY BYPASS GRAFTING (CABG) (N/A) MITRAL VALVE REPAIR (MVR) (N/A) TRANSESOPHAGEAL ECHOCARDIOGRAM (TEE) (N/A) Plan CABG this admit Follow cardiac enzymes Repeat echocardiogram to  assess any change in LV function and ischemic mitral regurgitation Dental x-rays before possible mitral valve intervention   LOS: 0 days    Kathlee Nations Trigt III 01/26/2015

## 2015-01-27 ENCOUNTER — Inpatient Hospital Stay (HOSPITAL_COMMUNITY): Payer: Medicaid Other

## 2015-01-27 DIAGNOSIS — I340 Nonrheumatic mitral (valve) insufficiency: Secondary | ICD-10-CM

## 2015-01-27 DIAGNOSIS — I255 Ischemic cardiomyopathy: Secondary | ICD-10-CM

## 2015-01-27 DIAGNOSIS — I2510 Atherosclerotic heart disease of native coronary artery without angina pectoris: Secondary | ICD-10-CM

## 2015-01-27 LAB — LIPID PANEL
CHOL/HDL RATIO: 8.9 ratio
Cholesterol: 223 mg/dL — ABNORMAL HIGH (ref 0–200)
HDL: 25 mg/dL — AB (ref >40)
LDL Cholesterol: 179 mg/dL — ABNORMAL HIGH (ref 0–99)
TRIGLYCERIDES: 96 mg/dL (ref <150)
VLDL: 19 mg/dL (ref 0–40)

## 2015-01-27 LAB — PULMONARY FUNCTION TEST
DL/VA % pred: 143 %
DL/VA: 5.61 ml/min/mmHg/L
DLCO unc % pred: 72 %
DLCO unc: 11.77 ml/min/mmHg
FEF 25-75 Post: 2.72 L/sec
FEF 25-75 Pre: 2.08 L/sec
FEF2575-%Change-Post: 31 %
FEV1-%Change-Post: 5 %
FEV1-Post: 1.36 L
FEV1-Pre: 1.29 L
FEV1FVC-%Change-Post: 1 %
FEV6-%Change-Post: 4 %
FEV6-Post: 1.5 L
FEV6-Pre: 1.44 L
FVC-%Change-Post: 4 %
FVC-Post: 1.5 L
FVC-Pre: 1.44 L
Post FEV1/FVC ratio: 91 %
Post FEV6/FVC ratio: 100 %
Pre FEV1/FVC ratio: 89 %
Pre FEV6/FVC Ratio: 100 %
RV % pred: 74 %
RV: 1.29 L
TLC % pred: 66 %
TLC: 2.77 L

## 2015-01-27 LAB — CBC
HCT: 28.9 % — ABNORMAL LOW (ref 36.0–46.0)
Hemoglobin: 9.1 g/dL — ABNORMAL LOW (ref 12.0–15.0)
MCH: 25.8 pg — ABNORMAL LOW (ref 26.0–34.0)
MCHC: 31.5 g/dL (ref 30.0–36.0)
MCV: 81.9 fL (ref 78.0–100.0)
PLATELETS: 329 10*3/uL (ref 150–400)
RBC: 3.53 MIL/uL — AB (ref 3.87–5.11)
RDW: 13.7 % (ref 11.5–15.5)
WBC: 10.9 10*3/uL — AB (ref 4.0–10.5)

## 2015-01-27 LAB — BASIC METABOLIC PANEL
ANION GAP: 8 (ref 5–15)
BUN: 21 mg/dL — ABNORMAL HIGH (ref 6–20)
CALCIUM: 9.1 mg/dL (ref 8.9–10.3)
CHLORIDE: 111 mmol/L (ref 101–111)
CO2: 24 mmol/L (ref 22–32)
Creatinine, Ser: 0.91 mg/dL (ref 0.44–1.00)
GFR calc non Af Amer: >60 mL/min (ref >60)
Glucose, Bld: 109 mg/dL — ABNORMAL HIGH (ref 65–99)
Potassium: 3.7 mmol/L (ref 3.5–5.1)
Sodium: 143 mmol/L (ref 135–145)

## 2015-01-27 LAB — HEPARIN LEVEL (UNFRACTIONATED)
HEPARIN UNFRACTIONATED: 0.22 [IU]/mL — AB (ref 0.30–0.70)
Heparin Unfractionated: 0.34 IU/mL (ref 0.30–0.70)

## 2015-01-27 LAB — TROPONIN I: Troponin I: 0.48 ng/mL — ABNORMAL HIGH (ref <0.031)

## 2015-01-27 MED ORDER — ALBUTEROL SULFATE (2.5 MG/3ML) 0.083% IN NEBU
2.5000 mg | INHALATION_SOLUTION | Freq: Once | RESPIRATORY_TRACT | Status: AC
  Administered 2015-01-27: 2.5 mg via RESPIRATORY_TRACT

## 2015-01-27 MED ORDER — PERFLUTREN LIPID MICROSPHERE
1.0000 mL | INTRAVENOUS | Status: AC | PRN
  Administered 2015-01-27: 2 mL via INTRAVENOUS
  Filled 2015-01-27: qty 10

## 2015-01-27 NOTE — Progress Notes (Signed)
ANTICOAGULATION CONSULT NOTE - Follow-up Consult  Pharmacy Consult for Heparin Indication: chest pain/ACS  No Known Allergies  Patient Measurements: Height: 5' (152.4 cm) Weight: 99 lb 12.8 oz (45.269 kg) (Scale A) IBW/kg (Calculated) : 45.5  Vital Signs: Temp: 98 F (36.7 C) (09/21 1253) Temp Source: Oral (09/21 1253) BP: 104/54 mmHg (09/21 1253) Pulse Rate: 73 (09/21 1253)  Labs:  Recent Labs  01/26/15 1650 01/26/15 2318 01/27/15 0310 01/27/15 1302  HGB 10.3*  --  9.1*  --   HCT 32.4*  --  28.9*  --   PLT 349  --  329  --   HEPARINUNFRC  --  0.32 0.22* 0.34  CREATININE 1.11*  --  0.91  --   TROPONINI 0.14*  --   --   --     Estimated Creatinine Clearance: 45.8 mL/min (by C-G formula based on Cr of 0.91).  Assessment: 64 year old female on heparin for multivessel CAD - awaiting CABG (likely early next week per CVTS). Heparin level is at goal (HL= 0.34) after increase to 800 units/hr.   Goal of Therapy:  Heparin level 0.3-0.7 units/ml Monitor platelets by anticoagulation protocol: Yes   Plan:  -No heparin changes needed -Daily heparin level and CBC  Harland German, Pharm D 01/27/2015 1:48 PM

## 2015-01-27 NOTE — Progress Notes (Signed)
Patient Name: Breanna Alvarez Date of Encounter: 01/27/2015  Primary Cardiologist: Dr. Eldridge Dace   Active Problems:   Chronic systolic heart failure   CAD, multiple vessel   Moderate mitral regurgitation   Hyperlipidemia LDL goal <70   Essential hypertension   Unstable angina   Cardiomyopathy, ischemic    SUBJECTIVE  Denies any CP overnight or SOB.   CURRENT MEDS . aspirin  81 mg Oral Daily  . isosorbide mononitrate  30 mg Oral BID  . lisinopril  2.5 mg Oral Daily  . metoprolol  50 mg Oral BID  . pravastatin  80 mg Oral Daily    OBJECTIVE  Filed Vitals:   01/26/15 2122 01/27/15 0044 01/27/15 0510 01/27/15 0801  BP: 134/76 109/57 103/55 119/57  Pulse: 100 69 72 84  Temp: 97.9 F (36.6 C) 97.9 F (36.6 C) 98 F (36.7 C) 98.5 F (36.9 C)  TempSrc: Oral Oral Oral Oral  Resp: Height:      Weight:   99 lb 12.8 oz (45.269 kg)   SpO2: 100% 100% 99% 100%    Intake/Output Summary (Last 24 hours) at 01/27/15 1044 Last data filed at 01/27/15 1000  Gross per 24 hour  Intake 724.32 ml  Output   1050 ml  Net -325.68 ml   Filed Weights   01/26/15 1449 01/27/15 0510  Weight: 100 lb 1.4 oz (45.4 kg) 99 lb 12.8 oz (45.269 kg)    PHYSICAL EXAM  General: Pleasant, NAD. Neuro: Alert and oriented X 3. Moves all extremities spontaneously. Psych: Normal affect. HEENT:  Normal  Neck: Supple without bruits or JVD. Lungs:  Resp regular and unlabored, CTA. Heart: RRR no s3, s4, or murmurs. Abdomen: Soft, non-tender, non-distended, BS + x 4.  Extremities: No clubbing, cyanosis or edema. DP/PT/Radials 2+ and equal bilaterally.  Accessory Clinical Findings  CBC  Recent Labs  01/26/15 1650 01/27/15 0310  WBC 9.9 10.9*  HGB 10.3* 9.1*  HCT 32.4* 28.9*  MCV 81.8 81.9  PLT 349 329   Basic Metabolic Panel  Recent Labs  01/26/15 1650 01/27/15 0310  NA 139 143  K 4.1 3.7  CL 106 111  CO2 24 24  GLUCOSE 110* 109*  BUN 21* 21*  CREATININE 1.11*  0.91  CALCIUM 9.2 9.1   Cardiac Enzymes  Recent Labs  01/26/15 1650  TROPONINI 0.14*   Fasting Lipid Panel  Recent Labs  01/27/15 0310  CHOL 223*  HDL 25*  LDLCALC 179*  TRIG 96  CHOLHDL 8.9    TELE NSR with HR 60-80s    ECG  NSR with TWI in anterolateral leads  Echocardiogram 07/10/2014  LV EF: 40% -  45%  ------------------------------------------------------------------- Indications:   Dyspnea 786.09.  ------------------------------------------------------------------- History:  PMH: No prior cardiac history.  ------------------------------------------------------------------- Study Conclusions  - Left ventricle: There was mild focal basal hypertrophy of the septum. Systolic function was mildly to moderately reduced. The estimated ejection fraction was in the range of 40% to 45%. Diffuse hypokinesis. - Aortic valve: There was mild regurgitation. - Mitral valve: There was moderate regurgitation. - Pulmonary arteries: PA peak pressure: 55 mm Hg (S).    Radiology/Studies  Dg Orthopantogram  01/27/2015   CLINICAL DATA:  Preoperative for cardiac surgery.  EXAM: ORTHOPANTOGRAM/PANORAMIC  COMPARISON:  None.  FINDINGS: Mandible unremarkable.  No periapical lucencies or large cavities.  IMPRESSION: No significant abnormality identified.   Electronically Signed   By: Gaylyn Rong M.D.   On: 01/27/2015 10:00  Dg Chest 2 View  01/27/2015   CLINICAL DATA:  Congestive heart failure.  Preoperative assessment.  EXAM: CHEST  2 VIEW  COMPARISON:  07/14/2014  FINDINGS: Mild enlargement of the cardiopericardial silhouette, without edema. Tortuous thoracic aorta. The lungs appear clear.  No pleural effusion.  IMPRESSION: 1. Mild cardiomegaly, without edema.  The lungs appear clear.   Electronically Signed   By: Gaylyn Rong M.D.   On: 01/27/2015 09:59    ASSESSMENT AND PLAN  1. NSTEMI  - known multivessel CAD, previously declined CABG presented  with as direct admit  - seen by CT surgery yesterday, plan for CABG likely next week +/- mitral valve repair. Pending echo to assess change in LV function and mitral regurg.   - will add IV nitrate if has recurrent CP.  2. Multivessel CAD on cath 07/13/2014  - 90% prox Diag, 90-95% mid LAD, 80% mid RCA, severe stenosis in mid LCx  3. Moderate MR on echo 07/2014  4. ICM  5. HTN  6. HLD: Chol 223, LDL 179, HDL 25, need better control, she was discharged on Lipitor  in March, now on  pravastatin. Discussed with pharmacist, apparently she was having trouble tolerating lipitor , had financial issue with crestor?, may still consider lipitor   Signed, Amedeo Plenty Pager: [phone]  I have seen and examined the patient along with Azalee Course PA-C.  I have reviewed the chart, notes and new data.  I agree with PA's note.  PLAN: For CABG and MV repair after dental clearance. Switch to generic rosuvastatin. Pravastatin will not be enough and atorvastatin side effects tend to not be dose dependent.  Thurmon Fair, MD, [facility] [phone] 01/27/2015, 11:24 AM

## 2015-01-27 NOTE — Progress Notes (Signed)
  Echocardiogram 2D Echocardiogram with Definity has been performed.  Breanna Alvarez 01/27/2015, 1:00 PM

## 2015-01-27 NOTE — Progress Notes (Signed)
ANTICOAGULATION CONSULT NOTE - Follow-up Consult  Pharmacy Consult for Heparin Indication: chest pain/ACS  No Known Allergies  Patient Measurements: Height: 5' (152.4 cm) Weight: 100 lb 1.4 oz (45.4 kg) IBW/kg (Calculated) : 45.5  Vital Signs: Temp: 97.9 F (36.6 C) (09/21 0044) Temp Source: Oral (09/21 0044) BP: 109/57 mmHg (09/21 0044) Pulse Rate: 69 (09/21 0044)  Labs:  Recent Labs  01/26/15 1650 01/26/15 2318  HGB 10.3*  --   HCT 32.4*  --   PLT 349  --   HEPARINUNFRC  --  0.32  CREATININE 1.11*  --   TROPONINI 0.14*  --     Estimated Creatinine Clearance: 37.7 mL/min (by C-G formula based on Cr of 1.11).  Assessment: 64 year old female on heparin for multivessel CAD - awaiting CABG. Heparin level 0.32 (therapeutic) on gtt at 700 units/hr. No bleeding noted.  Goal of Therapy:  Heparin level 0.3-0.7 units/ml Monitor platelets by anticoagulation protocol: Yes   Plan:  Continue heparin drip at 700 units / hr Daily heparin level, CBC  Christoper Fabian, PharmD, BCPS Clinical pharmacist, pager [phone] 01/27/2015,1:44 AM

## 2015-01-27 NOTE — Progress Notes (Signed)
ANTICOAGULATION CONSULT NOTE - Follow-up Consult  Pharmacy Consult for Heparin Indication: chest pain/ACS  No Known Allergies  Patient Measurements: Height: 5' (152.4 cm) Weight: 100 lb 1.4 oz (45.4 kg) IBW/kg (Calculated) : 45.5  Vital Signs: Temp: 97.9 F (36.6 C) (09/21 0044) Temp Source: Oral (09/21 0044) BP: 109/57 mmHg (09/21 0044) Pulse Rate: 69 (09/21 0044)  Labs:  Recent Labs  01/26/15 1650 01/26/15 2318 01/27/15 0310  HGB 10.3*  --  9.1*  HCT 32.4*  --  28.9*  PLT 349  --  329  HEPARINUNFRC  --  0.32 0.22*  CREATININE 1.11*  --  0.91  TROPONINI 0.14*  --   --     Estimated Creatinine Clearance: 45.9 mL/min (by C-G formula based on Cr of 0.91).  Assessment: 63 year old female on heparin for multivessel CAD - awaiting CABG. Heparin level down to 0.22 (subtherapeutic) on 700 units/hr. Hgb down to 9.1 - watch closely. No issues with line or bleeding reported per RN.  Goal of Therapy:  Heparin level 0.3-0.7 units/ml Monitor platelets by anticoagulation protocol: Yes   Plan:  Increase heparin drip to 800 units / hr F/u 6 hr heparin level  Christoper Fabian, PharmD, BCPS Clinical pharmacist, pager [phone] 01/27/2015,5:00 AM

## 2015-01-28 ENCOUNTER — Inpatient Hospital Stay (HOSPITAL_COMMUNITY): Payer: Medicaid Other

## 2015-01-28 DIAGNOSIS — I5022 Chronic systolic (congestive) heart failure: Secondary | ICD-10-CM

## 2015-01-28 DIAGNOSIS — Z01810 Encounter for preprocedural cardiovascular examination: Secondary | ICD-10-CM

## 2015-01-28 DIAGNOSIS — I2510 Atherosclerotic heart disease of native coronary artery without angina pectoris: Secondary | ICD-10-CM | POA: Insufficient documentation

## 2015-01-28 LAB — CBC
HCT: 29 % — ABNORMAL LOW (ref 36.0–46.0)
HEMOGLOBIN: 9.1 g/dL — AB (ref 12.0–15.0)
MCH: 26.2 pg (ref 26.0–34.0)
MCHC: 31.4 g/dL (ref 30.0–36.0)
MCV: 83.6 fL (ref 78.0–100.0)
PLATELETS: 288 10*3/uL (ref 150–400)
RBC: 3.47 MIL/uL — ABNORMAL LOW (ref 3.87–5.11)
RDW: 13.9 % (ref 11.5–15.5)
WBC: 11.5 10*3/uL — ABNORMAL HIGH (ref 4.0–10.5)

## 2015-01-28 LAB — HEPARIN LEVEL (UNFRACTIONATED)
HEPARIN UNFRACTIONATED: 0.24 [IU]/mL — AB (ref 0.30–0.70)
HEPARIN UNFRACTIONATED: 0.48 [IU]/mL (ref 0.30–0.70)

## 2015-01-28 MED ORDER — ROSUVASTATIN CALCIUM 40 MG PO TABS: 40.0000 mg | ORAL_TABLET | Freq: Every day | ORAL | Status: DC

## 2015-01-28 MED ORDER — ROSUVASTATIN CALCIUM 40 MG PO TABS
40.0000 mg | ORAL_TABLET | Freq: Every day | ORAL | Status: DC
  Administered 2015-01-29: 40 mg via ORAL
  Filled 2015-01-28: qty 1

## 2015-01-28 NOTE — Progress Notes (Signed)
Patient Name: Breanna Alvarez Date of Encounter: 01/28/2015  Primary Cardiologist: Dr. Eldridge Dace   Active Problems:   Chronic systolic heart failure   CAD, multiple vessel   Moderate mitral regurgitation   Hyperlipidemia LDL goal <70   Essential hypertension   Unstable angina   Cardiomyopathy, ischemic    SUBJECTIVE  Denies any CP overnight or SOB.   CURRENT MEDS . aspirin  81 mg Oral Daily  . isosorbide mononitrate  30 mg Oral BID  . lisinopril  2.5 mg Oral Daily  . metoprolol  50 mg Oral BID  . pravastatin  80 mg Oral Daily    OBJECTIVE  Filed Vitals:   01/27/15 2020 01/27/15 2329 01/28/15 0507 01/28/15 1219  BP: 90/41 116/68 102/61 107/79  Pulse: 87 99 80 70  Temp: 98 F (36.7 C)  97.9 F (36.6 C) 98.2 F (36.8 C)  TempSrc: Oral  Oral Oral  Resp: Height:      Weight:   97 lb 7.1 oz (44.2 kg)   SpO2: 99% 100% 100% 100%    Intake/Output Summary (Last 24 hours) at 01/28/15 1301 Last data filed at 01/28/15 0934  Gross per 24 hour  Intake 989.93 ml  Output   1800 ml  Net -810.07 ml   Filed Weights   01/26/15 1449 01/27/15 0510 01/28/15 0507  Weight: 100 lb 1.4 oz (45.4 kg) 99 lb 12.8 oz (45.269 kg) 97 lb 7.1 oz (44.2 kg)    PHYSICAL EXAM  General: Pleasant, NAD. Neuro: Alert and oriented X 3. Moves all extremities spontaneously. Psych: Normal affect. HEENT:  Normal  Neck: Supple without bruits or JVD. Lungs:  Resp regular and unlabored, CTA. Heart: RRR no s3, s4, or murmurs. Abdomen: Soft, non-tender, non-distended, BS + x 4.  Extremities: No clubbing, cyanosis or edema. DP/PT/Radials 2+ and equal bilaterally.  Accessory Clinical Findings  CBC  Recent Labs  01/27/15 0310 01/28/15 0317  WBC 10.9* 11.5*  HGB 9.1* 9.1*  HCT 28.9* 29.0*  MCV 81.9 83.6  PLT 329 288   Basic Metabolic Panel  Recent Labs  01/26/15 1650 01/27/15 0310  NA 139 143  K 4.1 3.7  CL 106 111  CO2 24 24  GLUCOSE 110* 109*  BUN 21* 21*    CREATININE 1.11* 0.91  CALCIUM 9.2 9.1   Cardiac Enzymes  Recent Labs  01/26/15 1650 01/27/15 1912  TROPONINI 0.14* 0.48*   Fasting Lipid Panel  Recent Labs  01/27/15 0310  CHOL 223*  HDL 25*  LDLCALC 179*  TRIG 96  CHOLHDL 8.9    TELE NSR with HR 70s    ECG  NSR with TWI in anterolateral leads, poor R wave progression in anterior lead  Echocardiogram 07/10/2014  LV EF: 40% -  45%  ------------------------------------------------------------------- Indications:   Dyspnea 786.09.  ------------------------------------------------------------------- History:  PMH: No prior cardiac history.  ------------------------------------------------------------------- Study Conclusions  - Left ventricle: There was mild focal basal hypertrophy of the septum. Systolic function was mildly to moderately reduced. The estimated ejection fraction was in the range of 40% to 45%. Diffuse hypokinesis. - Aortic valve: There was mild regurgitation. - Mitral valve: There was moderate regurgitation. - Pulmonary arteries: PA peak pressure: 55 mm Hg (S).    Radiology/Studies  Dg Orthopantogram  01/27/2015   CLINICAL DATA:  Preoperative for cardiac surgery.  EXAM: ORTHOPANTOGRAM/PANORAMIC  COMPARISON:  None.  FINDINGS: Mandible unremarkable.  No periapical lucencies or large cavities.  IMPRESSION: No significant abnormality identified.  Electronically Signed   By: Gaylyn Rong M.D.   On: 01/27/2015 10:00   Dg Chest 2 View  01/27/2015   CLINICAL DATA:  Congestive heart failure.  Preoperative assessment.  EXAM: CHEST  2 VIEW  COMPARISON:  07/14/2014  FINDINGS: Mild enlargement of the cardiopericardial silhouette, without edema. Tortuous thoracic aorta. The lungs appear clear.  No pleural effusion.  IMPRESSION: 1. Mild cardiomegaly, without edema.  The lungs appear clear.   Electronically Signed   By: Gaylyn Rong M.D.   On: 01/27/2015 09:59    ASSESSMENT AND  PLAN  1. NSTEMI  - known multivessel CAD, previously declined CABG presented with as direct admit  - seen by CT surgery, plan for CABG likely next week +/- mitral valve repair. Pending echo to assess change in LV function and mitral regurg.   - Echo 01/27/2015 EF 20-25%, akinesis of mid apical anteroseptal, lateral, inferolateral and apical myocardium, grade 2 diastolic dysfunction. Moderate MR, PA peak pressure . When compare to last echo in 07/2014, EF has went down to 40-45%.   - will add IV nitrate if has recurrent CP.  - Cath result noted below, but echo shows akinesis, but her recent symptom consistent with angina, not sure if any benefit of viability test/MRI prior to CABG  2. Multivessel CAD on cath 07/13/2014  - 90% prox Diag, 90-95% mid LAD, 80% mid RCA, severe stenosis in mid LCx  3. Moderate MR on echo 07/2014  4. ICM  5. HTN  6. HLD: Chol 223, LDL 179, HDL 25, need better control, changed to generic crestor  Signed, Amedeo Plenty Pager: [phone]  I have seen and examined the patient along with Azalee Course PA-C.  I have reviewed the chart, notes and new data.  I agree with PA's note.  Key new complaints: she has minimal angina now and no dyspnea Key examination changes: no S3, JVD, rales or edema Key new findings / data: ECG shows Q waves in V1-V2, but not greatly changed from 6 months earlier; echo shows extensive wall motion abnormalities with multivessel distribution  PLAN: The absence of extensive Q waves and the presence of angina pectoris suggest there is still substantial viable myocardium. A viability study would offer some indication about prognosis and magnitude of LV recovery postop, but I do not think it will change the decision to revascularize. Will discuss with Dr.van Trigt. Preop workup in progress.  Thurmon Fair, MD, [facility]  [facility] [phone] 01/28/2015, 1:21 PM

## 2015-01-28 NOTE — Progress Notes (Signed)
CARDIAC REHAB PHASE I   PRE:  Rate/Rhythm: 75 SR    BP: sitting 96/58    SaO2: 97 RA  MODE:  Ambulation: 440 ft   POST:  Rate/Rhythm: 106 ST    BP: sitting 120/61     SaO2: 100 RA  Pt able to walk without angina, independent. She did need rest toward end due to fatigue. HR elevated to 106 ST. Discussed sternal precautions, mobility, d/c plan and IS. Gave OHS booklet and guideline and preop video to watch. Pt watched a video on YouTube of an actual OHS therefore she is already informed. Pt can walk independently as long as she does not have angina. [phone]  Breanna Alvarez [place] CES, ACSM 01/28/2015 3:33 PM

## 2015-01-28 NOTE — Progress Notes (Signed)
Utilization review completed. Bertha Stanfill, RN, BSN. 

## 2015-01-28 NOTE — Progress Notes (Signed)
ANTICOAGULATION CONSULT NOTE - Follow-up Consult  Pharmacy Consult for Heparin Indication: chest pain/ACS  No Known Allergies  Patient Measurements: Height: 5' (152.4 cm) Weight: 97 lb 7.1 oz (44.2 kg) (scale a) IBW/kg (Calculated) : 45.5  Vital Signs: Temp: 98.2 F (36.8 C) (09/22 1219) Temp Source: Oral (09/22 1219) BP: 107/79 mmHg (09/22 1219) Pulse Rate: 70 (09/22 1219)  Labs:  Recent Labs  01/26/15 1650  01/27/15 0310 01/27/15 1302 01/27/15 1912 01/28/15 0317 01/28/15 1110  HGB 10.3*  --  9.1*  --   --  9.1*  --   HCT 32.4*  --  28.9*  --   --  29.0*  --   PLT 349  --  329  --   --  288  --   HEPARINUNFRC  --   < > 0.22* 0.34  --  0.24* 0.48  CREATININE 1.11*  --  0.91  --   --   --   --   TROPONINI 0.14*  --   --   --  0.48*  --   --   < > = values in this interval not displayed.  Estimated Creatinine Clearance: 44.7 mL/min (by C-G formula based on Cr of 0.91).  Assessment: 64 year old female on heparin for multivessel CAD - awaiting CABG (likely early next week per CVTS). Heparin level now at goal (HL= 0.48) after increase to 900 units/hr. Hgb low but stable.    Goal of Therapy:  Heparin level 0.3-0.7 units/ml Monitor platelets by anticoagulation protocol: Yes   Plan:  -No heparin changes -Daily heparin level and CBC  Harland German, Pharm D 01/28/2015 1:03 PM

## 2015-01-28 NOTE — Progress Notes (Signed)
ANTICOAGULATION CONSULT NOTE - Follow-up Consult  Pharmacy Consult for Heparin Indication: chest pain/ACS  No Known Allergies  Patient Measurements: Height: 5' (152.4 cm) Weight: 99 lb 12.8 oz (45.269 kg) (Scale A) IBW/kg (Calculated) : 45.5  Vital Signs: Temp: 98 F (36.7 C) (09/21 2020) Temp Source: Oral (09/21 2020) BP: 116/68 mmHg (09/21 2329) Pulse Rate: 99 (09/21 2329)  Labs:  Recent Labs  01/26/15 1650  01/27/15 0310 01/27/15 1302 01/27/15 1912 01/28/15 0317  HGB 10.3*  --  9.1*  --   --  9.1*  HCT 32.4*  --  28.9*  --   --  29.0*  PLT 349  --  329  --   --  288  HEPARINUNFRC  --   < > 0.22* 0.34  --  0.24*  CREATININE 1.11*  --  0.91  --   --   --   TROPONINI 0.14*  --   --   --  0.48*  --   < > = values in this interval not displayed.  Estimated Creatinine Clearance: 45.8 mL/min (by C-G formula based on Cr of 0.91).  Assessment: 64 year old female on heparin for multivessel CAD - awaiting CABG (likely early next week per CVTS). Heparin level down to 0.24 (subtherapeutic). Hgb low but stable. No issues with line or bleeding reported per RN.   Goal of Therapy:  Heparin level 0.3-0.7 units/ml Monitor platelets by anticoagulation protocol: Yes   Plan:  -Increase heparin to 900 units/hr -F/u 6 hr heparin level  Christoper Fabian, PharmD, BCPS Clinical pharmacist, pager [phone]  01/28/2015 5:07 AM

## 2015-01-28 NOTE — Progress Notes (Signed)
Pre-op Cardiac Surgery  Carotid Findings:  Right: 1-39% ICA stenosis.  Left: 40-59% ICA stenosis, highest end of scale. Vertebral artery flow is antegrade.   Upper Extremity Right Left  Brachial Pressures 124T 122T  Radial Waveforms T T  Ulnar Waveforms T T  Palmar Arch (Allen's Test) WNL WNL   Findings:  WNL    Lower  Extremity Right Left  Dorsalis Pedis    Anterior Tibial    Posterior Tibial    Ankle/Brachial Indices      Findings:

## 2015-01-29 DIAGNOSIS — I2510 Atherosclerotic heart disease of native coronary artery without angina pectoris: Secondary | ICD-10-CM

## 2015-01-29 LAB — CBC
HCT: 29.7 % — ABNORMAL LOW (ref 36.0–46.0)
HEMOGLOBIN: 9.6 g/dL — AB (ref 12.0–15.0)
MCH: 26.8 pg (ref 26.0–34.0)
MCHC: 32.3 g/dL (ref 30.0–36.0)
MCV: 83 fL (ref 78.0–100.0)
Platelets: 319 10*3/uL (ref 150–400)
RBC: 3.58 MIL/uL — ABNORMAL LOW (ref 3.87–5.11)
RDW: 14 % (ref 11.5–15.5)
WBC: 13 10*3/uL — AB (ref 4.0–10.5)

## 2015-01-29 LAB — HEPARIN LEVEL (UNFRACTIONATED): HEPARIN UNFRACTIONATED: 0.39 [IU]/mL (ref 0.30–0.70)

## 2015-01-29 NOTE — Progress Notes (Signed)
Procedure(s) (LRB): CORONARY ARTERY BYPASS GRAFTING (CABG) (N/A) MITRAL VALVE REPAIR (MVR) (N/A) TRANSESOPHAGEAL ECHOCARDIOGRAM (TEE) (N/A) Patient admitted with unstable angina and severe three-vessel coronary disease. She is on IV heparin without angina. She walked 500 feet with cardiac rehabilitation.  Patient examined and recent studies reviewed. Echocardiogram shows reduced LVEF 25% with mild-moderate mitral regurgitation.  PFTs show adequate pulmonary reserve for sternotomy  Pre-CABG Doppler show mild-moderate carotid disease  Chest x-ray is clear. Panorex is clear.  Objective: Vital signs in last 24 hours: Temp:  [97.6 F (36.4 C)-98.6 F (37 C)] 98.6 F (37 C) (09/23 1222) Pulse Rate:  [72-91] 79 (09/23 1222) Cardiac Rhythm:  [-] Sinus bradycardia (09/23 0700) Resp:  [18] 18 (09/23 1222) BP: (101-123)/(54-63) 119/58 mmHg (09/23 1222) SpO2:  [98 %-100 %] 100 % (09/23 1222) Weight:  [100 lb 9.6 oz (45.632 kg)] 100 lb 9.6 oz (45.632 kg) (09/23 0418)  Hemodynamic parameters for last 24 hours:  sinus rhythm  Intake/Output from previous day: 09/22 0701 - 09/23 0700 In: 808.4 [P.O.:600; I.V.:208.4] Out: 1900 [Urine:1900] Intake/Output this shift: Total I/O In: 240 [P.O.:240] Out: 600 [Urine:600]  Alert and comfortable Pulses present in all extremities Lungs clear No cardiac murmur appreciated Neuro intact  Lab Results:  Recent Labs  01/28/15 0317 01/29/15 0440  WBC 11.5* 13.0*  HGB 9.1* 9.6*  HCT 29.0* 29.7*  PLT 288 319   BMET:  Recent Labs  01/26/15 1650 01/27/15 0310  NA 139 143  K 4.1 3.7  CL 106 111  CO2 24 24  GLUCOSE 110* 109*  BUN 21* 21*  CREATININE 1.11* 0.91  CALCIUM 9.2 9.1    PT/INR: No results for input(s): LABPROT, INR in the last 72 hours. ABG    Component Value Date/Time   PHART 7.440 07/13/2014 0800   HCO3 25.8* 07/13/2014 0801   TCO2 27 07/13/2014 0801   ACIDBASEDEF 1.0 07/13/2014 0800   O2SAT 63.0 07/13/2014 0801    CBG (last 3)  No results for input(s): GLUCAP in the last 72 hours.  Assessment/Plan: S/P Procedure(s) (LRB): CORONARY ARTERY BYPASS GRAFTING (CABG) (N/A) MITRAL VALVE REPAIR (MVR) (N/A) TRANSESOPHAGEAL ECHOCARDIOGRAM (TEE) (N/A) Plan multivessel CABG on Monday, September 26. Procedure indications benefits and risks discussed with patient.   LOS: 3 days    Kathlee Nations Trigt III 01/29/2015

## 2015-01-29 NOTE — Progress Notes (Signed)
Patient Name: Breanna Alvarez Date of Encounter: 01/29/2015  Active Problems:   Chronic systolic heart failure   CAD, multiple vessel   Moderate mitral regurgitation   Hyperlipidemia LDL goal <70   Essential hypertension   Unstable angina   Cardiomyopathy, ischemic   Coronary artery disease involving native coronary artery of native heart without angina pectoris   Length of Stay: 3  SUBJECTIVE  Feels well. No angina or dyspnea at rest  CURRENT MEDS . aspirin  81 mg Oral Daily  . isosorbide mononitrate  30 mg Oral BID  . lisinopril  2.5 mg Oral Daily  . metoprolol  50 mg Oral BID  . rosuvastatin  40 mg Oral q1800    OBJECTIVE   Intake/Output Summary (Last 24 hours) at 01/29/15 1353 Last data filed at 01/29/15 1042  Gross per 24 hour  Intake 808.35 ml  Output   1500 ml  Net -691.65 ml   Filed Weights   01/27/15 0510 01/28/15 0507 01/29/15 0418  Weight: 99 lb 12.8 oz (45.269 kg) 97 lb 7.1 oz (44.2 kg) 100 lb 9.6 oz (45.632 kg)    PHYSICAL EXAM Filed Vitals:   01/28/15 1219 01/28/15 2034 01/29/15 0418 01/29/15 1222  BP: 107/79 123/63 101/54 119/58  Pulse: 70 91 72 79  Temp: 98.2 F (36.8 C) 98 F (36.7 C) 97.6 F (36.4 C) 98.6 F (37 C)  TempSrc: Oral Oral Oral Oral  Resp: Height:      Weight:   100 lb 9.6 oz (45.632 kg)   SpO2: 100% 98% 98% 100%   General: Alert, oriented x3, no distress Head: no evidence of trauma, PERRL, EOMI, no exophtalmos or lid lag, no myxedema, no xanthelasma; normal ears, nose and oropharynx Neck: normal jugular venous pulsations and no hepatojugular reflux; brisk carotid pulses without delay and no carotid bruits Chest: clear to auscultation, no signs of consolidation by percussion or palpation, normal fremitus, symmetrical and full respiratory excursions Cardiovascular: normal position and quality of the apical impulse, regular rhythm, normal first and second heart sounds, no rubs or gallops, 1-2/6 apical holosystolic  murmur Abdomen: no tenderness or distention, no masses by palpation, no abnormal pulsatility or arterial bruits, normal bowel sounds, no hepatosplenomegaly Extremities: no clubbing, cyanosis or edema; 2+ radial, ulnar and brachial pulses bilaterally; 2+ right femoral, posterior tibial and dorsalis pedis pulses; 2+ left femoral, posterior tibial and dorsalis pedis pulses; no subclavian or femoral bruits Neurological: grossly nonfocal  LABS  CBC  Recent Labs  01/28/15 0317 01/29/15 0440  WBC 11.5* 13.0*  HGB 9.1* 9.6*  HCT 29.0* 29.7*  MCV 83.6 83.0  PLT 288 319   Basic Metabolic Panel  Recent Labs  01/26/15 1650 01/27/15 0310  NA 139 143  K 4.1 3.7  CL 106 111  CO2 24 24  GLUCOSE 110* 109*  BUN 21* 21*  CREATININE 1.11* 0.91  CALCIUM 9.2 9.1   Cardiac Enzymes  Recent Labs  01/26/15 1650 01/27/15 1912  TROPONINI 0.14* 0.48*   Fasting Lipid Panel  Recent Labs  01/27/15 0310  CHOL 223*  HDL 25*  LDLCALC 179*  TRIG 96  CHOLHDL 8.9    Radiology Studies Imaging results have been reviewed and No results found.  TELE NSR   ASSESSMENT AND PLAN Appears euvolemic. No angina today, still on IV heparin. Multivessel CAD with severely depressed LV systolic function and secondary mitral insufficiency. Scheduled for CABG on Monday, increased surgical risk due to severely depressed left  ventricular systolic function.   Thurmon Fair, MD, [facility] [phone] office [phone] pager 01/29/2015 1:53 PM

## 2015-01-29 NOTE — Progress Notes (Signed)
ANTICOAGULATION CONSULT NOTE - Follow-up Consult  Pharmacy Consult for Heparin Indication: chest pain/ACS  No Known Allergies  Patient Measurements: Height: 5' (152.4 cm) Weight: 100 lb 9.6 oz (45.632 kg) IBW/kg (Calculated) : 45.5  Vital Signs: Temp: 97.6 F (36.4 C) (09/23 0418) Temp Source: Oral (09/23 0418) BP: 101/54 mmHg (09/23 0418) Pulse Rate: 72 (09/23 0418)  Labs:  Recent Labs  01/26/15 1650  01/27/15 0310  01/27/15 1912 01/28/15 0317 01/28/15 1110 01/29/15 0440  HGB 10.3*  --  9.1*  --   --  9.1*  --  9.6*  HCT 32.4*  --  28.9*  --   --  29.0*  --  29.7*  PLT 349  --  329  --   --  288  --  319  HEPARINUNFRC  --   < > 0.22*  < >  --  0.24* 0.48 0.39  CREATININE 1.11*  --  0.91  --   --   --   --   --   TROPONINI 0.14*  --   --   --  0.48*  --   --   --   < > = values in this interval not displayed.  Estimated Creatinine Clearance: 46 mL/min (by C-G formula based on Cr of 0.91).  Assessment: 64 year old female on heparin for multivessel CAD - awaiting CABG (likely early next week per CVTS). Heparin level now at goal (HL= 0.39) on current dose. Hgb low but stable.    Goal of Therapy:  Heparin level 0.3-0.7 units/ml Monitor platelets by anticoagulation protocol: Yes   Plan:   -No heparin changes -Daily heparin level and CBC  Ulyses Southward, PharmD Pager: [phone] 01/29/2015 10:10 AM

## 2015-01-29 NOTE — Progress Notes (Signed)
Pt a/o, no c/o pain, VSS, remains on hep gtt, pt stable

## 2015-01-30 ENCOUNTER — Inpatient Hospital Stay (HOSPITAL_COMMUNITY): Payer: Medicaid Other

## 2015-01-30 LAB — URINALYSIS, ROUTINE W REFLEX MICROSCOPIC
Bilirubin Urine: NEGATIVE
Glucose, UA: NEGATIVE mg/dL
Hgb urine dipstick: NEGATIVE
Ketones, ur: NEGATIVE mg/dL
Nitrite: NEGATIVE
Protein, ur: NEGATIVE mg/dL
Specific Gravity, Urine: 1.006 (ref 1.005–1.030)
Urobilinogen, UA: 0.2 mg/dL (ref 0.0–1.0)
pH: 6.5 (ref 5.0–8.0)

## 2015-01-30 LAB — SURGICAL PCR SCREEN
MRSA, PCR: NEGATIVE
Staphylococcus aureus: NEGATIVE

## 2015-01-30 LAB — CBC
HCT: 27.8 % — ABNORMAL LOW (ref 36.0–46.0)
HEMOGLOBIN: 9 g/dL — AB (ref 12.0–15.0)
MCH: 26.8 pg (ref 26.0–34.0)
MCHC: 32.4 g/dL (ref 30.0–36.0)
MCV: 82.7 fL (ref 78.0–100.0)
PLATELETS: 256 10*3/uL (ref 150–400)
RBC: 3.36 MIL/uL — ABNORMAL LOW (ref 3.87–5.11)
RDW: 13.9 % (ref 11.5–15.5)
WBC: 13 10*3/uL — ABNORMAL HIGH (ref 4.0–10.5)

## 2015-01-30 LAB — HEPARIN LEVEL (UNFRACTIONATED): HEPARIN UNFRACTIONATED: 0.6 [IU]/mL (ref 0.30–0.70)

## 2015-01-30 LAB — URINE MICROSCOPIC-ADD ON

## 2015-01-30 MED ORDER — FLUTICASONE PROPIONATE 50 MCG/ACT NA SUSP
1.0000 | Freq: Every day | NASAL | Status: DC
  Administered 2015-01-30 – 2015-01-31 (×2): 1 via NASAL
  Filled 2015-01-30: qty 16

## 2015-01-30 MED ORDER — CARVEDILOL 6.25 MG PO TABS
6.2500 mg | ORAL_TABLET | Freq: Two times a day (BID) | ORAL | Status: DC
  Administered 2015-01-30 – 2015-02-01 (×5): 6.25 mg via ORAL
  Filled 2015-01-30 (×5): qty 1

## 2015-01-30 MED ORDER — PRAVASTATIN SODIUM 20 MG PO TABS
20.0000 mg | ORAL_TABLET | Freq: Every day | ORAL | Status: DC
  Administered 2015-01-30 – 2015-02-04 (×5): 20 mg via ORAL
  Filled 2015-01-30 (×7): qty 1

## 2015-01-30 NOTE — Progress Notes (Signed)
ANTICOAGULATION CONSULT NOTE - Follow Up Consult  Pharmacy Consult for Heparin3 Indication: CAD  Allergies  Allergen Reactions  . Atorvastatin Other (See Comments)    Gas, chest tightness  . Crestor [Rosuvastatin Calcium] Other (See Comments)    Muscle aches    Patient Measurements: Height: 5' (152.4 cm) Weight: 100 lb (45.36 kg) (Scale A) IBW/kg (Calculated) : 45.5 Heparin Dosing Weight: 45.4 kg  Vital Signs: Temp: 97.9 F (36.6 C) (09/24 0447) Temp Source: Oral (09/24 0447) BP: 110/61 mmHg (09/24 0447) Pulse Rate: 69 (09/24 0447)  Labs:  Recent Labs  01/27/15 1912  01/28/15 0317 01/28/15 1110 01/29/15 0440 01/30/15 0402  HGB  --   < > 9.1*  --  9.6* 9.0*  HCT  --   --  29.0*  --  29.7* 27.8*  PLT  --   --  288  --  319 256  HEPARINUNFRC  --   --  0.24* 0.48 0.39 0.60  TROPONINI 0.48*  --   --   --   --   --   < > = values in this interval not displayed.  Estimated Creatinine Clearance: 45.9 mL/min (by C-G formula based on Cr of 0.91).   Assessment: 64 year old female s/p NSTEMI in early 2016 diagnosed with multivessel disease CAD and CABG was recommended.  She declined CABG at that time.  Presents today as a direct admit with chest pain.  Pharmacy asked to begin heparin.  Anticoagulation: Heparin for ACS. For CABG next week. HL= 0.6. Hgb down slightly 9.6>9. Plts [id number].  Goal of Therapy:  Heparin level 0.3-0.7 units/ml Monitor platelets by anticoagulation protocol: Yes   Plan:  Heparin 900 units/hr -daily CBC/HL -CABG Mon   Crystal S. Merilynn Finland, PharmD, BCPS Clinical Staff Pharmacist Pager [phone]  Misty Stanley Stillinger 01/30/2015,12:43 PM

## 2015-01-30 NOTE — Progress Notes (Addendum)
Patient ID: Breanna Alvarez, female   DOB: 1950-08-23, 64 y.o.   MRN: [id number]    Primary cardiologist:  Subjective:    Some nasal congestion this AM. Reports crestor causes muscle aches  Objective:   Temp:  [97.9 F (36.6 C)-98.6 F (37 C)] 97.9 F (36.6 C) (09/24 0447) Pulse Rate:  [69-93] 69 (09/24 0447) Resp:  [16-18] 18 (09/24 0447) BP: (110-135)/(58-68) 110/61 mmHg (09/24 0447) SpO2:  [100 %] 100 % (09/24 0447) Weight:  [100 lb (45.36 kg)] 100 lb (45.36 kg) (09/24 0447) Last BM Date: 01/29/15  Filed Weights   01/28/15 0507 01/29/15 0418 01/30/15 0447  Weight: 97 lb 7.1 oz (44.2 kg) 100 lb 9.6 oz (45.632 kg) 100 lb (45.36 kg)    Intake/Output Summary (Last 24 hours) at 01/30/15 1022 Last data filed at 01/30/15 0900  Gross per 24 hour  Intake    600 ml  Output   2300 ml  Net  -1700 ml    Telemetry: NSR Exam:  General: NAD  Resp: CTAB  Cardiac: RRR, no m/r/g, n jvd  GI: abdomen soft, NT, ND  MSK: no LE edema  Neuro: no focal deficits  Psych: appropriate affect  Lab Results:  Basic Metabolic Panel:  Recent Labs Lab 01/26/15 1650 01/27/15 0310  NA 139 143  K 4.1 3.7  CL 106 111  CO2 24 24  GLUCOSE 110* 109*  BUN 21* 21*  CREATININE 1.11* 0.91  CALCIUM 9.2 9.1    Liver Function Tests: No results for input(s): AST, ALT, ALKPHOS, BILITOT, PROT, ALBUMIN in the last 168 hours.  CBC:  Recent Labs Lab 01/28/15 0317 01/29/15 0440 01/30/15 0402  WBC 11.5* 13.0* 13.0*  HGB 9.1* 9.6* 9.0*  HCT 29.0* 29.7* 27.8*  MCV 83.6 83.0 82.7  PLT 288 319 256    Cardiac Enzymes:  Recent Labs Lab 01/26/15 1650 01/27/15 1912  TROPONINI 0.14* 0.48*    BNP: No results for input(s): PROBNP in the last 8760 hours.  Coagulation: No results for input(s): INR in the last 168 hours.  ECG:   Medications:   Scheduled Medications: . aspirin  81 mg Oral Daily  . isosorbide mononitrate  30 mg Oral BID  . lisinopril  2.5 mg Oral Daily  .  metoprolol  50 mg Oral BID  . rosuvastatin  40 mg Oral q1800     Infusions: . heparin 900 Units/hr (01/29/15 0423)     PRN Medications:  acetaminophen, nitroGLYCERIN, ondansetron (ZOFRAN) IV     Assessment/Plan    1. CAD - multivessel CAD with decreased LVEF, plan for CABG Monday - 01/2015 echo LVEF 20-25%, grade II diastolic dusfunction, moderate MR - continue medical therapy over the weekend with ASA, imdur, ACE-I, beta blocker, hep gtt, statin - reports muscle aches on crestor, will try pravastatin  daily. With low EF change lopressor to coreg.   2. Nasal congestion - will try flonase        Dina Rich, M.D.

## 2015-01-31 LAB — COMPREHENSIVE METABOLIC PANEL
ALT: 20 U/L (ref 14–54)
AST: 21 U/L (ref 15–41)
Albumin: 3.2 g/dL — ABNORMAL LOW (ref 3.5–5.0)
Alkaline Phosphatase: 69 U/L (ref 38–126)
Anion gap: 7 (ref 5–15)
BUN: 17 mg/dL (ref 6–20)
CO2: 26 mmol/L (ref 22–32)
Calcium: 9 mg/dL (ref 8.9–10.3)
Chloride: 108 mmol/L (ref 101–111)
Creatinine, Ser: 1.07 mg/dL — ABNORMAL HIGH (ref 0.44–1.00)
GFR calc Af Amer: >60 mL/min (ref >60)
GFR calc non Af Amer: 54 mL/min — ABNORMAL LOW (ref >60)
Glucose, Bld: 113 mg/dL — ABNORMAL HIGH (ref 65–99)
Potassium: 3.9 mmol/L (ref 3.5–5.1)
Sodium: 141 mmol/L (ref 135–145)
Total Bilirubin: 0.4 mg/dL (ref 0.3–1.2)
Total Protein: 7.3 g/dL (ref 6.5–8.1)

## 2015-01-31 LAB — BLOOD GAS, ARTERIAL
Acid-base deficit: 0.3 mmol/L (ref 0.0–2.0)
Bicarbonate: 23.5 mEq/L (ref 20.0–24.0)
Drawn by: 36277
FIO2: 0.21
O2 Saturation: 94.6 %
Patient temperature: 98.6
TCO2: 24.6 mmol/L (ref 0–100)
pCO2 arterial: 36.1 mmHg (ref 35.0–45.0)
pH, Arterial: 7.429 (ref 7.350–7.450)
pO2, Arterial: 73.9 mmHg — ABNORMAL LOW (ref 80.0–100.0)

## 2015-01-31 LAB — CBC
HEMATOCRIT: 28.1 % — AB (ref 36.0–46.0)
Hemoglobin: 9 g/dL — ABNORMAL LOW (ref 12.0–15.0)
MCH: 26.8 pg (ref 26.0–34.0)
MCHC: 32 g/dL (ref 30.0–36.0)
MCV: 83.6 fL (ref 78.0–100.0)
PLATELETS: 255 10*3/uL (ref 150–400)
RBC: 3.36 MIL/uL — AB (ref 3.87–5.11)
RDW: 14.1 % (ref 11.5–15.5)
WBC: 11.4 10*3/uL — AB (ref 4.0–10.5)

## 2015-01-31 LAB — PROTIME-INR
INR: 1.23 (ref 0.00–1.49)
Prothrombin Time: 15.6 seconds — ABNORMAL HIGH (ref 11.6–15.2)

## 2015-01-31 LAB — PREPARE RBC (CROSSMATCH)

## 2015-01-31 LAB — SURGICAL PCR SCREEN
MRSA, PCR: NEGATIVE
STAPHYLOCOCCUS AUREUS: NEGATIVE

## 2015-01-31 LAB — HEPARIN LEVEL (UNFRACTIONATED): Heparin Unfractionated: 0.58 IU/mL (ref 0.30–0.70)

## 2015-01-31 LAB — ABO/RH: ABO/RH(D): O POS

## 2015-01-31 MED ORDER — DIAZEPAM 2 MG PO TABS: 2.0000 mg | ORAL_TABLET | Freq: Once | ORAL | Status: DC

## 2015-01-31 MED ORDER — CHLORHEXIDINE GLUCONATE 4 % EX LIQD
60.0000 mL | Freq: Once | CUTANEOUS | Status: AC
  Administered 2015-01-31: 4 via TOPICAL
  Filled 2015-01-31: qty 15

## 2015-01-31 MED ORDER — PLASMA-LYTE 148 IV SOLN
INTRAVENOUS | Status: AC
  Administered 2015-02-01: 500 mL
  Filled 2015-01-31: qty 2.5

## 2015-01-31 MED ORDER — BISACODYL 5 MG PO TBEC
5.0000 mg | DELAYED_RELEASE_TABLET | Freq: Once | ORAL | Status: AC
  Administered 2015-01-31: 5 mg via ORAL
  Filled 2015-01-31: qty 1

## 2015-01-31 MED ORDER — NITROGLYCERIN IN D5W 200-5 MCG/ML-% IV SOLN
2.0000 ug/min | INTRAVENOUS | Status: DC
Start: 2015-02-01 — End: 2015-02-01
  Administered 2015-02-01: 5 ug/min via INTRAVENOUS
  Filled 2015-01-31: qty 250

## 2015-01-31 MED ORDER — SODIUM CHLORIDE 0.9 % IV SOLN
INTRAVENOUS | Status: DC
  Filled 2015-01-31: qty 30

## 2015-01-31 MED ORDER — PHENYLEPHRINE HCL 10 MG/ML IJ SOLN
30.0000 ug/min | INTRAMUSCULAR | Status: AC
  Administered 2015-02-01: 25 ug/min via INTRAVENOUS
  Filled 2015-01-31: qty 2

## 2015-01-31 MED ORDER — SODIUM CHLORIDE 0.9 % IV SOLN
INTRAVENOUS | Status: AC
  Administered 2015-02-01: 1 [IU]/h via INTRAVENOUS
  Filled 2015-01-31: qty 2.5

## 2015-01-31 MED ORDER — METOPROLOL TARTRATE 12.5 MG HALF TABLET
12.5000 mg | ORAL_TABLET | Freq: Once | ORAL | Status: AC
  Administered 2015-02-01: 12.5 mg via ORAL
  Filled 2015-01-31: qty 1

## 2015-01-31 MED ORDER — EPINEPHRINE HCL 1 MG/ML IJ SOLN
0.0000 ug/min | INTRAMUSCULAR | Status: AC
  Administered 2015-02-01: 3 ug/min via INTRAVENOUS
  Filled 2015-01-31: qty 4

## 2015-01-31 MED ORDER — DEXMEDETOMIDINE HCL IN NACL 400 MCG/100ML IV SOLN
0.1000 ug/kg/h | INTRAVENOUS | Status: AC
Start: 2015-02-01 — End: 2015-02-01
  Administered 2015-02-01: .4 ug/kg/h via INTRAVENOUS
  Filled 2015-01-31: qty 100

## 2015-01-31 MED ORDER — CHLORHEXIDINE GLUCONATE 4 % EX LIQD
60.0000 mL | Freq: Once | CUTANEOUS | Status: AC
  Administered 2015-02-01: 4 via TOPICAL
  Filled 2015-01-31: qty 60

## 2015-01-31 MED ORDER — CHLORHEXIDINE GLUCONATE 0.12 % MT SOLN
15.0000 mL | Freq: Once | OROMUCOSAL | Status: AC
  Administered 2015-02-01: 15 mL via OROMUCOSAL
  Filled 2015-01-31: qty 15

## 2015-01-31 MED ORDER — CEFUROXIME SODIUM 750 MG IJ SOLR
750.0000 mg | INTRAMUSCULAR | Status: DC
  Filled 2015-01-31: qty 750

## 2015-01-31 MED ORDER — DEXTROSE 5 % IV SOLN
1.5000 g | INTRAVENOUS | Status: AC
  Administered 2015-02-01: 1.5 g via INTRAVENOUS
  Administered 2015-02-01: .75 g via INTRAVENOUS
  Filled 2015-01-31: qty 1.5

## 2015-01-31 MED ORDER — VANCOMYCIN HCL 10 G IV SOLR
1250.0000 mg | INTRAVENOUS | Status: AC
  Administered 2015-02-01: 1250 mg via INTRAVENOUS
  Filled 2015-01-31 (×2): qty 1250

## 2015-01-31 MED ORDER — TEMAZEPAM 15 MG PO CAPS: 15.0000 mg | ORAL_CAPSULE | Freq: Once | ORAL | Status: DC | PRN

## 2015-01-31 MED ORDER — POTASSIUM CHLORIDE 2 MEQ/ML IV SOLN
80.0000 meq | INTRAVENOUS | Status: DC
  Filled 2015-01-31: qty 40

## 2015-01-31 MED ORDER — MAGNESIUM SULFATE 50 % IJ SOLN
40.0000 meq | INTRAMUSCULAR | Status: DC
  Filled 2015-01-31: qty 10

## 2015-01-31 MED ORDER — DOPAMINE-DEXTROSE 3.2-5 MG/ML-% IV SOLN
0.0000 ug/kg/min | INTRAVENOUS | Status: AC
  Administered 2015-02-01: 3 ug/kg/min via INTRAVENOUS
  Filled 2015-01-31: qty 250

## 2015-01-31 MED ORDER — ALPRAZOLAM 0.25 MG PO TABS: 0.2500 mg | ORAL_TABLET | ORAL | Status: DC | PRN

## 2015-01-31 MED ORDER — SODIUM CHLORIDE 0.9 % IV SOLN
INTRAVENOUS | Status: AC
  Administered 2015-02-01: 69.8 mL/h via INTRAVENOUS
  Administered 2015-02-01: 14:00:00 via INTRAVENOUS
  Filled 2015-01-31: qty 40

## 2015-01-31 NOTE — Anesthesia Preprocedure Evaluation (Addendum)
Anesthesia Evaluation  Patient identified by MRN, date of birth, ID band Patient awake    Reviewed: Allergy & Precautions, NPO status , Patient's Chart, lab work & pertinent test results  History of Anesthesia Complications Negative for: history of anesthetic complications  Airway Mallampati: II  TM Distance: >3 FB Neck ROM: Full    Dental  (+) Dental Advisory Given   Pulmonary neg pulmonary ROS,    breath sounds clear to auscultation       Cardiovascular hypertension, Pt. on medications and Pt. on home beta blockers (-) angina+ CAD, + Past MI and +CHF  + Valvular Problems/Murmurs MR  Rhythm:Regular Rate:Normal  01/27/15 ECHO: EF 20-25%, apical akinesis, mild-mod MR   Neuro/Psych negative neurological ROS     GI/Hepatic negative GI ROS, Neg liver ROS,   Endo/Other  negative endocrine ROS  Renal/GU negative Renal ROS     Musculoskeletal   Abdominal   Peds  Hematology  (+) Blood dyscrasia (Hb 9.0), ,   Anesthesia Other Findings   Reproductive/Obstetrics                          Anesthesia Physical Anesthesia Plan  ASA: IV  Anesthesia Plan: General   Post-op Pain Management:    Induction: Intravenous  Airway Management Planned: Oral ETT  Additional Equipment: Arterial line, PA Cath, 3D TEE and Ultrasound Guidance Line Placement  Intra-op Plan:   Post-operative Plan: Post-operative intubation/ventilation  Informed Consent: I have reviewed the patients History and Physical, chart, labs and discussed the procedure including the risks, benefits and alternatives for the proposed anesthesia with the patient or authorized representative who has indicated his/her understanding and acceptance.   Dental advisory given  Plan Discussed with: Surgeon and CRNA  Anesthesia Plan Comments: (Plan routine monitors, A line, PA cath, GETA with TEE and post op ventilation)        Anesthesia  Quick Evaluation

## 2015-01-31 NOTE — Progress Notes (Signed)
ANTICOAGULATION CONSULT NOTE - Follow Up Consult  Pharmacy Consult for Heparin3 Indication: CAD  Allergies  Allergen Reactions  . Atorvastatin Other (See Comments)    Gas, chest tightness  . Crestor [Rosuvastatin Calcium] Other (See Comments)    Muscle aches    Patient Measurements: Height: 5' (152.4 cm) Weight: 100 lb 3.2 oz (45.45 kg) (scale a) IBW/kg (Calculated) : 45.5 Heparin Dosing Weight: 45.4 kg  Vital Signs: Temp: 98.1 F (36.7 C) (09/25 0957) Temp Source: Oral (09/25 0957) BP: 109/64 mmHg (09/25 0957) Pulse Rate: 81 (09/25 0558)  Labs:  Recent Labs  01/29/15 0440 01/30/15 0402 01/31/15 0315  HGB 9.6* 9.0* 9.0*  HCT 29.7* 27.8* 28.1*  PLT 319 256 255  LABPROT  --   --  15.6*  INR  --   --  1.23  HEPARINUNFRC 0.39 0.60 0.58  CREATININE  --   --  1.07*    Estimated Creatinine Clearance: 39.2 mL/min (by C-G formula based on Cr of 1.07).   Assessment: 64 year old female s/p NSTEMI in early 2016 diagnosed with multivessel disease CAD and CABG was recommended.  She declined CABG at that time.  Presents today as a direct admit with chest pain.  Pharmacy asked to begin heparin.  Anticoagulation: Heparin for ACS. For CABG next week. HL= 0.58. Hgb down slightly 9.6>9. Plts 319>256 both stable today 9/25..  Goal of Therapy:  Heparin level 0.3-0.7 units/ml Monitor platelets by anticoagulation protocol: Yes   Plan:  Heparin 900 units/hr -daily CBC/HL -CABG Mon   Breanna Alvarez S. Merilynn Finland, PharmD, BCPS Clinical Staff Pharmacist Pager [phone]  Breanna Alvarez 01/31/2015,11:15 AM

## 2015-01-31 NOTE — Progress Notes (Signed)
Patient ID: Breanna Alvarez, female   DOB: Sep 16, 1950, 64 y.o.   MRN: [id number]     Subjective:    No complaints  Objective:   Temp:  [97.7 F (36.5 C)-98.1 F (36.7 C)] 98.1 F (36.7 C) (09/25 0558) Pulse Rate:  [77-81] 81 (09/25 0558) Resp:  [16-18] 16 (09/25 0558) BP: (105-115)/(57-61) 105/57 mmHg (09/25 0558) SpO2:  [98 %-100 %] 98 % (09/25 0558) Weight:  [100 lb 3.2 oz (45.45 kg)] 100 lb 3.2 oz (45.45 kg) (09/25 0558) Last BM Date: 01/30/15  Filed Weights   01/29/15 0418 01/30/15 0447 01/31/15 0558  Weight: 100 lb 9.6 oz (45.632 kg) 100 lb (45.36 kg) 100 lb 3.2 oz (45.45 kg)    Intake/Output Summary (Last 24 hours) at 01/31/15 0709 Last data filed at 01/31/15 0600  Gross per 24 hour  Intake    720 ml  Output   1850 ml  Net  -1130 ml    Telemetry: NSR. Short runt of PSVT with aberrancy  Exam:  General: NAD  Resp: CTAB  Cardiac: RRR, no m/r/g, no jvd  GI: abdomen soft, NT, ND  MSK: no LE edema  Neuro: no focal deficits  Psych: appropriate affect  Lab Results:  Basic Metabolic Panel:  Recent Labs Lab 01/26/15 1650 01/27/15 0310 01/31/15 0315  NA 139 143 141  K 4.1 3.7 3.9  CL 106 111 108  CO2 GLUCOSE 110* 109* 113*  BUN 21* 21* 17  CREATININE 1.11* 0.91 1.07*  CALCIUM 9.2 9.1 9.0    Liver Function Tests:  Recent Labs Lab 01/31/15 0315  AST 21  ALT 20  ALKPHOS 69  BILITOT 0.4  PROT 7.3  ALBUMIN 3.2*    CBC:  Recent Labs Lab 01/29/15 0440 01/30/15 0402 01/31/15 0315  WBC 13.0* 13.0* 11.4*  HGB 9.6* 9.0* 9.0*  HCT 29.7* 27.8* 28.1*  MCV 83.0 82.7 83.6  PLT 319 256 255    Cardiac Enzymes:  Recent Labs Lab 01/26/15 1650 01/27/15 1912  TROPONINI 0.14* 0.48*    BNP: No results for input(s): PROBNP in the last 8760 hours.  Coagulation:  Recent Labs Lab 01/31/15 0315  INR 1.23    ECG:   Medications:   Scheduled Medications: . aspirin  81 mg Oral Daily  . carvedilol  6.25 mg Oral BID WC  .  fluticasone  1 spray Each Nare Daily  . isosorbide mononitrate  30 mg Oral BID  . lisinopril  2.5 mg Oral Daily  . pravastatin  20 mg Oral q1800     Infusions: . heparin 900 Units/hr (01/29/15 0423)     PRN Medications:  acetaminophen, nitroGLYCERIN, ondansetron (ZOFRAN) IV     Assessment/Plan    1. CAD - multivessel CAD with decreased LVEF, plan for CABG Monday. Will make NPO at midnight.  - 01/2015 echo LVEF 20-25%, grade II diastolic dusfunction, moderate MR - continue medical therapy over the weekend with ASA, imdur, ACE-I, beta blocker, hep gtt, statin - reports muscle aches on crestor, changed to pravastatin  daily yesterday which she tolerated well        Dina Rich, M.D.

## 2015-02-01 ENCOUNTER — Inpatient Hospital Stay (HOSPITAL_COMMUNITY)
Admission: AD | Admit: 2015-02-01 | Discharge: 2015-02-01 | Disposition: A | Discharge status: Home / Self Care | Payer: Medicaid Other | Source: Ambulatory Visit | Attending: Cardiothoracic Surgery | Admitting: Cardiothoracic Surgery

## 2015-02-01 ENCOUNTER — Inpatient Hospital Stay (HOSPITAL_COMMUNITY): Payer: Medicaid Other

## 2015-02-01 ENCOUNTER — Inpatient Hospital Stay (HOSPITAL_COMMUNITY): Payer: Medicaid Other | Admitting: Anesthesiology

## 2015-02-01 ENCOUNTER — Encounter (HOSPITAL_COMMUNITY)
Admission: AD | Disposition: A | Discharge status: Skilled Nursing Facility | Payer: Medicaid Other | Source: Ambulatory Visit | Attending: Cardiothoracic Surgery

## 2015-02-01 ENCOUNTER — Encounter (HOSPITAL_COMMUNITY): Payer: Self-pay | Admitting: Certified Registered Nurse Anesthetist

## 2015-02-01 DIAGNOSIS — I255 Ischemic cardiomyopathy: Secondary | ICD-10-CM

## 2015-02-01 DIAGNOSIS — I5022 Chronic systolic (congestive) heart failure: Secondary | ICD-10-CM

## 2015-02-01 DIAGNOSIS — I2510 Atherosclerotic heart disease of native coronary artery without angina pectoris: Secondary | ICD-10-CM

## 2015-02-01 DIAGNOSIS — Z951 Presence of aortocoronary bypass graft: Secondary | ICD-10-CM

## 2015-02-01 HISTORY — PX: PLACEMENT OF CENTRIMAG VENTRICULAR ASSIST DEVICE: SHX6226

## 2015-02-01 HISTORY — PX: TEE WITHOUT CARDIOVERSION: SHX5443

## 2015-02-01 HISTORY — PX: CORONARY ARTERY BYPASS GRAFT: SHX141

## 2015-02-01 LAB — POCT I-STAT 4, (NA,K, GLUC, HGB,HCT)
Glucose, Bld: 111 mg/dL — ABNORMAL HIGH (ref 65–99)
HEMATOCRIT: 27 % — AB (ref 36.0–46.0)
Hemoglobin: 9.2 g/dL — ABNORMAL LOW (ref 12.0–15.0)
POTASSIUM: 3.3 mmol/L — AB (ref 3.5–5.1)
SODIUM: 144 mmol/L (ref 135–145)

## 2015-02-01 LAB — CBC
HCT: 26.2 % — ABNORMAL LOW (ref 36.0–46.0)
HEMATOCRIT: 28.1 % — AB (ref 36.0–46.0)
Hemoglobin: 8.4 g/dL — ABNORMAL LOW (ref 12.0–15.0)
Hemoglobin: 9.5 g/dL — ABNORMAL LOW (ref 12.0–15.0)
MCH: 26.6 pg (ref 26.0–34.0)
MCH: 29 pg (ref 26.0–34.0)
MCHC: 32.1 g/dL (ref 30.0–36.0)
MCHC: 33.8 g/dL (ref 30.0–36.0)
MCV: 82.9 fL (ref 78.0–100.0)
MCV: 85.7 fL (ref 78.0–100.0)
Platelets: 230 10*3/uL (ref 150–400)
Platelets: 60 10*3/uL — ABNORMAL LOW (ref 150–400)
RBC: 3.16 MIL/uL — ABNORMAL LOW (ref 3.87–5.11)
RBC: 3.28 MIL/uL — AB (ref 3.87–5.11)
RDW: 13.9 % (ref 11.5–15.5)
RDW: 14.4 % (ref 11.5–15.5)
WBC: 10.8 10*3/uL — ABNORMAL HIGH (ref 4.0–10.5)
WBC: 8.4 10*3/uL (ref 4.0–10.5)

## 2015-02-01 LAB — POCT I-STAT 3, ART BLOOD GAS (G3+)
ACID-BASE DEFICIT: 1 mmol/L (ref 0.0–2.0)
ACID-BASE DEFICIT: 3 mmol/L — AB (ref 0.0–2.0)
Acid-base deficit: 2 mmol/L (ref 0.0–2.0)
BICARBONATE: 23.9 meq/L (ref 20.0–24.0)
Bicarbonate: 23.1 mEq/L (ref 20.0–24.0)
Bicarbonate: 22.3 mEq/L (ref 20.0–24.0)
O2 SAT: 100 %
O2 Saturation: 100 %
O2 Saturation: 100 %
PCO2 ART: 38.1 mmHg (ref 35.0–45.0)
PO2 ART: 398 mmHg — AB (ref 80.0–100.0)
PO2 ART: 477 mmHg — AB (ref 80.0–100.0)
TCO2: 24 mmol/L (ref 0–100)
TCO2: 25 mmol/L (ref 0–100)
TCO2: 23 mmol/L (ref 0–100)
pCO2 arterial: 40 mmHg (ref 35.0–45.0)
pCO2 arterial: 38.7 mmHg (ref 35.0–45.0)
pH, Arterial: 7.391 (ref 7.350–7.450)
pH, Arterial: 7.385 (ref 7.350–7.450)
pH, Arterial: 7.369 (ref 7.350–7.450)
pO2, Arterial: 259 mmHg — ABNORMAL HIGH (ref 80.0–100.0)

## 2015-02-01 LAB — POCT I-STAT, CHEM 8
BUN: 16 mg/dL (ref 6–20)
BUN: 13 mg/dL (ref 6–20)
BUN: 16 mg/dL (ref 6–20)
BUN: 12 mg/dL (ref 6–20)
BUN: 14 mg/dL (ref 6–20)
BUN: 14 mg/dL (ref 6–20)
BUN: 13 mg/dL (ref 6–20)
BUN: 14 mg/dL (ref 6–20)
BUN: 13 mg/dL (ref 6–20)
CALCIUM ION: 0.96 mmol/L — AB (ref 1.13–1.30)
CALCIUM ION: 1.14 mmol/L (ref 1.13–1.30)
CALCIUM ION: 0.87 mmol/L — AB (ref 1.13–1.30)
CHLORIDE: 104 mmol/L (ref 101–111)
CHLORIDE: 108 mmol/L (ref 101–111)
CHLORIDE: 107 mmol/L (ref 101–111)
CHLORIDE: 108 mmol/L (ref 101–111)
CHLORIDE: 106 mmol/L (ref 101–111)
CREATININE: 0.5 mg/dL (ref 0.44–1.00)
CREATININE: 0.5 mg/dL (ref 0.44–1.00)
CREATININE: 0.6 mg/dL (ref 0.44–1.00)
CREATININE: 0.5 mg/dL (ref 0.44–1.00)
CREATININE: 0.6 mg/dL (ref 0.44–1.00)
Calcium, Ion: 1.21 mmol/L (ref 1.13–1.30)
Calcium, Ion: 0.94 mmol/L — ABNORMAL LOW (ref 1.13–1.30)
Calcium, Ion: 1.26 mmol/L (ref 1.13–1.30)
Calcium, Ion: 0.92 mmol/L — ABNORMAL LOW (ref 1.13–1.30)
Calcium, Ion: 1.02 mmol/L — ABNORMAL LOW (ref 1.13–1.30)
Calcium, Ion: 0.99 mmol/L — ABNORMAL LOW (ref 1.13–1.30)
Chloride: 113 mmol/L — ABNORMAL HIGH (ref 101–111)
Chloride: 114 mmol/L — ABNORMAL HIGH (ref 101–111)
Chloride: 106 mmol/L (ref 101–111)
Chloride: 104 mmol/L (ref 101–111)
Creatinine, Ser: 0.7 mg/dL (ref 0.44–1.00)
Creatinine, Ser: 0.7 mg/dL (ref 0.44–1.00)
Creatinine, Ser: 0.6 mg/dL (ref 0.44–1.00)
Creatinine, Ser: 0.7 mg/dL (ref 0.44–1.00)
GLUCOSE: 163 mg/dL — AB (ref 65–99)
GLUCOSE: 125 mg/dL — AB (ref 65–99)
GLUCOSE: 93 mg/dL (ref 65–99)
GLUCOSE: 123 mg/dL — AB (ref 65–99)
Glucose, Bld: 108 mg/dL — ABNORMAL HIGH (ref 65–99)
Glucose, Bld: 136 mg/dL — ABNORMAL HIGH (ref 65–99)
Glucose, Bld: 166 mg/dL — ABNORMAL HIGH (ref 65–99)
Glucose, Bld: 194 mg/dL — ABNORMAL HIGH (ref 65–99)
Glucose, Bld: 155 mg/dL — ABNORMAL HIGH (ref 65–99)
HCT: 28 % — ABNORMAL LOW (ref 36.0–46.0)
HCT: 23 % — ABNORMAL LOW (ref 36.0–46.0)
HCT: 20 % — ABNORMAL LOW (ref 36.0–46.0)
HCT: 20 % — ABNORMAL LOW (ref 36.0–46.0)
HCT: 26 % — ABNORMAL LOW (ref 36.0–46.0)
HEMATOCRIT: 22 % — AB (ref 36.0–46.0)
HEMATOCRIT: 20 % — AB (ref 36.0–46.0)
HEMATOCRIT: 21 % — AB (ref 36.0–46.0)
HEMATOCRIT: 26 % — AB (ref 36.0–46.0)
HEMOGLOBIN: 7.5 g/dL — AB (ref 12.0–15.0)
HEMOGLOBIN: 7.1 g/dL — AB (ref 12.0–15.0)
HEMOGLOBIN: 9.5 g/dL — AB (ref 12.0–15.0)
HEMOGLOBIN: 6.8 g/dL — AB (ref 12.0–15.0)
HEMOGLOBIN: 8.8 g/dL — AB (ref 12.0–15.0)
Hemoglobin: 6.8 g/dL — CL (ref 12.0–15.0)
Hemoglobin: 8.8 g/dL — ABNORMAL LOW (ref 12.0–15.0)
Hemoglobin: 7.8 g/dL — ABNORMAL LOW (ref 12.0–15.0)
Hemoglobin: 6.8 g/dL — CL (ref 12.0–15.0)
POTASSIUM: 4 mmol/L (ref 3.5–5.1)
POTASSIUM: 3.9 mmol/L (ref 3.5–5.1)
POTASSIUM: 4.8 mmol/L (ref 3.5–5.1)
POTASSIUM: 5.8 mmol/L — AB (ref 3.5–5.1)
Potassium: 5 mmol/L (ref 3.5–5.1)
Potassium: 4.9 mmol/L (ref 3.5–5.1)
Potassium: 4.1 mmol/L (ref 3.5–5.1)
Potassium: 4.2 mmol/L (ref 3.5–5.1)
Potassium: 3.9 mmol/L (ref 3.5–5.1)
SODIUM: 141 mmol/L (ref 135–145)
SODIUM: 141 mmol/L (ref 135–145)
Sodium: 139 mmol/L (ref 135–145)
Sodium: 140 mmol/L (ref 135–145)
Sodium: 133 mmol/L — ABNORMAL LOW (ref 135–145)
Sodium: 138 mmol/L (ref 135–145)
Sodium: 141 mmol/L (ref 135–145)
Sodium: 141 mmol/L (ref 135–145)
Sodium: 140 mmol/L (ref 135–145)
TCO2: 25 mmol/L (ref 0–100)
TCO2: 25 mmol/L (ref 0–100)
TCO2: 26 mmol/L (ref 0–100)
TCO2: 24 mmol/L (ref 0–100)
TCO2: 23 mmol/L (ref 0–100)
TCO2: 24 mmol/L (ref 0–100)
TCO2: 25 mmol/L (ref 0–100)
TCO2: 20 mmol/L (ref 0–100)
TCO2: 23 mmol/L (ref 0–100)

## 2015-02-01 LAB — APTT
APTT: 75 s — AB (ref 24–37)
APTT: 40 s — AB (ref 24–37)

## 2015-02-01 LAB — HEPARIN LEVEL (UNFRACTIONATED): HEPARIN UNFRACTIONATED: 0.37 [IU]/mL (ref 0.30–0.70)

## 2015-02-01 LAB — DIC (DISSEMINATED INTRAVASCULAR COAGULATION)PANEL
D-Dimer, Quant: 0.8 ug/mL-FEU — ABNORMAL HIGH (ref 0.00–0.48)
Fibrinogen: 200 mg/dL — ABNORMAL LOW (ref 204–475)
INR: 0.95 (ref 0.00–1.49)
Platelets: 59 10*3/uL — ABNORMAL LOW (ref 150–400)
Prothrombin Time: 12.9 seconds (ref 11.6–15.2)
Smear Review: NONE SEEN
aPTT: 41 seconds — ABNORMAL HIGH (ref 24–37)

## 2015-02-01 LAB — HEMOGLOBIN AND HEMATOCRIT, BLOOD
HCT: 28.9 % — ABNORMAL LOW (ref 36.0–46.0)
Hemoglobin: 9.7 g/dL — ABNORMAL LOW (ref 12.0–15.0)

## 2015-02-01 LAB — BASIC METABOLIC PANEL
Anion gap: 4 — ABNORMAL LOW (ref 5–15)
BUN: 17 mg/dL (ref 6–20)
CO2: 25 mmol/L (ref 22–32)
Calcium: 8.8 mg/dL — ABNORMAL LOW (ref 8.9–10.3)
Chloride: 112 mmol/L — ABNORMAL HIGH (ref 101–111)
Creatinine, Ser: 0.95 mg/dL (ref 0.44–1.00)
GFR calc Af Amer: >60 mL/min (ref >60)
GFR calc non Af Amer: >60 mL/min (ref >60)
Glucose, Bld: 123 mg/dL — ABNORMAL HIGH (ref 65–99)
Potassium: 4.5 mmol/L (ref 3.5–5.1)
Sodium: 141 mmol/L (ref 135–145)

## 2015-02-01 LAB — CARBOXYHEMOGLOBIN
Carboxyhemoglobin: 1.6 % — ABNORMAL HIGH (ref 0.5–1.5)
Methemoglobin: 0.6 % (ref 0.0–1.5)
O2 Saturation: 63 %
Total hemoglobin: 7.3 g/dL — ABNORMAL LOW (ref 12.0–16.0)

## 2015-02-01 LAB — PROTIME-INR
INR: 2.75 — ABNORMAL HIGH (ref 0.00–1.49)
INR: 0.99 (ref 0.00–1.49)
Prothrombin Time: 28.7 seconds — ABNORMAL HIGH (ref 11.6–15.2)
Prothrombin Time: 13.3 seconds (ref 11.6–15.2)

## 2015-02-01 LAB — FIBRINOGEN: Fibrinogen: 114 mg/dL — ABNORMAL LOW (ref 204–475)

## 2015-02-01 LAB — HEMOGLOBIN A1C
Hgb A1c MFr Bld: 6.3 % — ABNORMAL HIGH (ref 4.8–5.6)
Mean Plasma Glucose: 134 mg/dL

## 2015-02-01 LAB — PLATELET COUNT: Platelets: 110 10*3/uL — ABNORMAL LOW (ref 150–400)

## 2015-02-01 LAB — PREPARE RBC (CROSSMATCH)

## 2015-02-01 SURGERY — CORONARY ARTERY BYPASS GRAFTING (CABG)
Anesthesia: General | Site: Chest

## 2015-02-01 MED ORDER — ACETAMINOPHEN 500 MG PO TABS
1000.0000 mg | ORAL_TABLET | Freq: Four times a day (QID) | ORAL | Status: DC
  Administered 2015-02-02 (×2): 1000 mg via ORAL
  Filled 2015-02-01 (×18): qty 2

## 2015-02-01 MED ORDER — 0.9 % SODIUM CHLORIDE (POUR BTL) OPTIME
TOPICAL | Status: DC | PRN
  Administered 2015-02-01: 1000 mL
  Administered 2015-02-01: 4000 mL
  Administered 2015-02-01 (×3): 1000 mL

## 2015-02-01 MED ORDER — DOPAMINE-DEXTROSE 3.2-5 MG/ML-% IV SOLN: 0.0000 ug/kg/min | INTRAVENOUS | Status: DC

## 2015-02-01 MED ORDER — SODIUM CHLORIDE 0.9 % IV SOLN
0.5000 g/h | Freq: Once | INTRAVENOUS | Status: DC
  Filled 2015-02-01: qty 20

## 2015-02-01 MED ORDER — METOPROLOL TARTRATE 25 MG/10 ML ORAL SUSPENSION
12.5000 mg | Freq: Two times a day (BID) | ORAL | Status: DC
  Filled 2015-02-01 (×11): qty 5

## 2015-02-01 MED ORDER — PROPOFOL 10 MG/ML IV BOLUS
INTRAVENOUS | Status: DC | PRN
  Administered 2015-02-01: 20 mg via INTRAVENOUS

## 2015-02-01 MED ORDER — LACTATED RINGERS IV SOLN
INTRAVENOUS | Status: DC | PRN
  Administered 2015-02-01: 06:00:00 via INTRAVENOUS

## 2015-02-01 MED ORDER — ALBUMIN HUMAN 5 % IV SOLN
INTRAVENOUS | Status: DC | PRN
  Administered 2015-02-01 (×3): via INTRAVENOUS

## 2015-02-01 MED ORDER — POTASSIUM CHLORIDE 10 MEQ/50ML IV SOLN
10.0000 meq | INTRAVENOUS | Status: AC
  Administered 2015-02-01 (×3): 10 meq via INTRAVENOUS

## 2015-02-01 MED ORDER — SODIUM CHLORIDE 0.9 % IV SOLN
Freq: Once | INTRAVENOUS | Status: AC
  Administered 2015-02-01: 21:00:00 via INTRAVENOUS

## 2015-02-01 MED ORDER — MILRINONE IN DEXTROSE 20 MG/100ML IV SOLN
INTRAVENOUS | Status: DC | PRN
  Administered 2015-02-01: .3 ug/kg/min via INTRAVENOUS

## 2015-02-01 MED ORDER — PHENYLEPHRINE HCL 10 MG/ML IJ SOLN
0.0000 ug/min | INTRAVENOUS | Status: DC
  Filled 2015-02-01: qty 2

## 2015-02-01 MED ORDER — FENTANYL CITRATE (PF) 100 MCG/2ML IJ SOLN
INTRAMUSCULAR | Status: DC | PRN
  Administered 2015-02-01: 100 ug via INTRAVENOUS
  Administered 2015-02-01: 150 ug via INTRAVENOUS
  Administered 2015-02-01: 100 ug via INTRAVENOUS
  Administered 2015-02-01: 250 ug via INTRAVENOUS
  Administered 2015-02-01: 150 ug via INTRAVENOUS
  Administered 2015-02-01: 100 ug via INTRAVENOUS
  Administered 2015-02-01: 150 ug via INTRAVENOUS
  Administered 2015-02-01: 400 ug via INTRAVENOUS
  Administered 2015-02-01: 150 ug via INTRAVENOUS
  Administered 2015-02-01 (×2): 100 ug via INTRAVENOUS

## 2015-02-01 MED ORDER — PANTOPRAZOLE SODIUM 40 MG PO TBEC: 40.0000 mg | DELAYED_RELEASE_TABLET | Freq: Every day | ORAL | Status: DC

## 2015-02-01 MED ORDER — MIDAZOLAM HCL 2 MG/2ML IJ SOLN
INTRAMUSCULAR | Status: AC
  Filled 2015-02-01: qty 4

## 2015-02-01 MED ORDER — CALCIUM CHLORIDE 10 % IV SOLN
INTRAVENOUS | Status: AC
  Filled 2015-02-01: qty 10

## 2015-02-01 MED ORDER — STERILE WATER FOR INJECTION IJ SOLN
INTRAMUSCULAR | Status: AC
  Filled 2015-02-01: qty 30

## 2015-02-01 MED ORDER — FENTANYL CITRATE (PF) 250 MCG/5ML IJ SOLN
INTRAMUSCULAR | Status: AC
  Filled 2015-02-01: qty 5

## 2015-02-01 MED ORDER — HEPARIN SODIUM (PORCINE) 1000 UNIT/ML IJ SOLN
INTRAMUSCULAR | Status: DC | PRN
  Administered 2015-02-01: 17000 [IU] via INTRAVENOUS
  Administered 2015-02-01: 3000 [IU] via INTRAVENOUS

## 2015-02-01 MED ORDER — DOCUSATE SODIUM 100 MG PO CAPS: 200.0000 mg | ORAL_CAPSULE | Freq: Every day | ORAL | Status: DC

## 2015-02-01 MED ORDER — SODIUM CHLORIDE 0.9 % IJ SOLN: 3.0000 mL | INTRAMUSCULAR | Status: DC | PRN

## 2015-02-01 MED ORDER — ACETAMINOPHEN 160 MG/5ML PO SOLN
1000.0000 mg | Freq: Four times a day (QID) | ORAL | Status: DC
  Administered 2015-02-02 – 2015-02-06 (×13): 1000 mg
  Filled 2015-02-01 (×12): qty 40.6

## 2015-02-01 MED ORDER — MAGNESIUM SULFATE 4 GM/100ML IV SOLN
4.0000 g | Freq: Once | INTRAVENOUS | Status: AC
  Administered 2015-02-01: 4 g via INTRAVENOUS
  Filled 2015-02-01: qty 100

## 2015-02-01 MED ORDER — LACTATED RINGERS IV SOLN
INTRAVENOUS | Status: DC | PRN
  Administered 2015-02-01 (×2): via INTRAVENOUS

## 2015-02-01 MED ORDER — LIDOCAINE HCL (CARDIAC) 20 MG/ML IV SOLN
INTRAVENOUS | Status: AC
  Filled 2015-02-01: qty 5

## 2015-02-01 MED ORDER — FAMOTIDINE IN NACL 20-0.9 MG/50ML-% IV SOLN
20.0000 mg | Freq: Two times a day (BID) | INTRAVENOUS | Status: AC
  Administered 2015-02-01 – 2015-02-02 (×2): 20 mg via INTRAVENOUS
  Filled 2015-02-01: qty 50

## 2015-02-01 MED ORDER — TRAMADOL HCL 50 MG PO TABS: 50.0000 mg | ORAL_TABLET | ORAL | Status: DC | PRN

## 2015-02-01 MED ORDER — VASOPRESSIN 20 UNIT/ML IV SOLN
0.0400 [IU]/min | INTRAVENOUS | Status: DC | PRN
  Filled 2015-02-01: qty 2

## 2015-02-01 MED ORDER — PHENYLEPHRINE 40 MCG/ML (10ML) SYRINGE FOR IV PUSH (FOR BLOOD PRESSURE SUPPORT)
PREFILLED_SYRINGE | INTRAVENOUS | Status: AC
  Filled 2015-02-01: qty 10

## 2015-02-01 MED ORDER — VECURONIUM BROMIDE 10 MG IV SOLR
INTRAVENOUS | Status: DC | PRN
  Administered 2015-02-01 (×4): 5 mg via INTRAVENOUS

## 2015-02-01 MED ORDER — ALBUMIN HUMAN 5 % IV SOLN
250.0000 mL | INTRAVENOUS | Status: AC | PRN
  Administered 2015-02-01 – 2015-02-02 (×4): 250 mL via INTRAVENOUS
  Filled 2015-02-01 (×2): qty 250

## 2015-02-01 MED ORDER — NOREPINEPHRINE BITARTRATE 1 MG/ML IV SOLN
0.0000 ug/min | INTRAVENOUS | Status: DC
  Administered 2015-02-04: 0 ug/min via INTRAVENOUS
  Administered 2015-02-06: 12 ug/min via INTRAVENOUS
  Administered 2015-02-06: 10 ug/min via INTRAVENOUS
  Administered 2015-02-07 (×2): 12 ug/min via INTRAVENOUS
  Administered 2015-02-08: 6 ug/min via INTRAVENOUS
  Administered 2015-02-09: 8 ug/min via INTRAVENOUS
  Administered 2015-02-09: 5 ug/min via INTRAVENOUS
  Administered 2015-02-10: 7 ug/min via INTRAVENOUS
  Administered 2015-02-10 – 2015-02-11 (×2): 6 ug/min via INTRAVENOUS
  Administered 2015-02-11: 8 ug/min via INTRAVENOUS
  Filled 2015-02-01 (×20): qty 4

## 2015-02-01 MED ORDER — MORPHINE SULFATE (PF) 2 MG/ML IV SOLN
1.0000 mg | INTRAVENOUS | Status: AC | PRN
  Administered 2015-02-01: 2 mg via INTRAVENOUS
  Administered 2015-02-02 (×2): 4 mg via INTRAVENOUS

## 2015-02-01 MED ORDER — PROPOFOL 10 MG/ML IV BOLUS
INTRAVENOUS | Status: AC
  Filled 2015-02-01: qty 20

## 2015-02-01 MED ORDER — MUPIROCIN 2 % EX OINT
1.0000 | TOPICAL_OINTMENT | Freq: Two times a day (BID) | CUTANEOUS | Status: DC
Start: 2015-02-01 — End: 2015-03-07
  Administered 2015-02-01 – 2015-03-07 (×65): 1 via TOPICAL
  Filled 2015-02-01 (×3): qty 22

## 2015-02-01 MED ORDER — ACETAMINOPHEN 650 MG RE SUPP
650.0000 mg | Freq: Once | RECTAL | Status: AC
  Administered 2015-02-01: 650 mg via RECTAL

## 2015-02-01 MED ORDER — EPHEDRINE SULFATE 50 MG/ML IJ SOLN
INTRAMUSCULAR | Status: AC
  Filled 2015-02-01: qty 1

## 2015-02-01 MED ORDER — ROCURONIUM BROMIDE 50 MG/5ML IV SOLN
INTRAVENOUS | Status: AC
  Filled 2015-02-01: qty 1

## 2015-02-01 MED ORDER — COAGULATION FACTOR VIIA RECOMB 1 MG IV SOLR
90.0000 ug/kg | Freq: Once | INTRAVENOUS | Status: AC
  Administered 2015-02-01 (×2): 2 mg via INTRAVENOUS
  Filled 2015-02-01: qty 4

## 2015-02-01 MED ORDER — HEMOSTATIC AGENTS (NO CHARGE) OPTIME
TOPICAL | Status: DC | PRN
  Administered 2015-02-01 (×2): 1 via TOPICAL

## 2015-02-01 MED ORDER — VANCOMYCIN HCL IN DEXTROSE 1-5 GM/200ML-% IV SOLN: 1000.0000 mg | Freq: Once | INTRAVENOUS | Status: DC

## 2015-02-01 MED ORDER — VECURONIUM BROMIDE 10 MG IV SOLR
INTRAVENOUS | Status: AC
  Filled 2015-02-01: qty 20

## 2015-02-01 MED ORDER — ASPIRIN EC 325 MG PO TBEC
325.0000 mg | DELAYED_RELEASE_TABLET | Freq: Every day | ORAL | Status: DC
  Administered 2015-02-09 – 2015-03-03 (×8): 325 mg via ORAL
  Filled 2015-02-01 (×39): qty 1

## 2015-02-01 MED ORDER — METOPROLOL TARTRATE 1 MG/ML IV SOLN
2.5000 mg | INTRAVENOUS | Status: DC | PRN
  Administered 2015-02-04 – 2015-02-05 (×3): 2.5 mg via INTRAVENOUS
  Administered 2015-02-11: 5 mg via INTRAVENOUS
  Administered 2015-02-12 – 2015-02-17 (×4): 2.5 mg via INTRAVENOUS
  Administered 2015-02-17: 5 mg via INTRAVENOUS
  Administered 2015-02-17 – 2015-02-18 (×4): 2.5 mg via INTRAVENOUS
  Filled 2015-02-01 (×10): qty 5

## 2015-02-01 MED ORDER — NOREPINEPHRINE BITARTRATE 1 MG/ML IV SOLN
0.0000 ug/min | INTRAVENOUS | Status: AC
  Administered 2015-02-01: 1 ug/min via INTRAVENOUS
  Filled 2015-02-01: qty 4

## 2015-02-01 MED ORDER — LIDOCAINE HCL (CARDIAC) 20 MG/ML IV SOLN
INTRAVENOUS | Status: DC | PRN
  Administered 2015-02-01: 20 mg via INTRAVENOUS

## 2015-02-01 MED ORDER — INSULIN REGULAR BOLUS VIA INFUSION
0.0000 [IU] | Freq: Three times a day (TID) | INTRAVENOUS | Status: DC
  Filled 2015-02-01: qty 10

## 2015-02-01 MED ORDER — SODIUM CHLORIDE 0.9 % IV SOLN: Freq: Once | INTRAVENOUS | Status: DC

## 2015-02-01 MED ORDER — CHLORHEXIDINE GLUCONATE 0.12 % MT SOLN
15.0000 mL | Freq: Two times a day (BID) | OROMUCOSAL | Status: DC
  Administered 2015-02-01 – 2015-02-12 (×21): 15 mL via OROMUCOSAL
  Filled 2015-02-01 (×2): qty 15

## 2015-02-01 MED ORDER — VANCOMYCIN HCL IN DEXTROSE 750-5 MG/150ML-% IV SOLN
750.0000 mg | INTRAVENOUS | Status: DC
  Administered 2015-02-02 – 2015-02-03 (×2): 750 mg via INTRAVENOUS
  Filled 2015-02-01 (×2): qty 150

## 2015-02-01 MED ORDER — SODIUM CHLORIDE 0.9 % IJ SOLN
3.0000 mL | Freq: Two times a day (BID) | INTRAMUSCULAR | Status: DC
  Administered 2015-02-02 – 2015-02-04 (×4): 3 mL via INTRAVENOUS
  Administered 2015-02-05: 10 mL via INTRAVENOUS
  Administered 2015-02-06 – 2015-02-08 (×3): 3 mL via INTRAVENOUS

## 2015-02-01 MED ORDER — MIDAZOLAM HCL 10 MG/2ML IJ SOLN
INTRAMUSCULAR | Status: AC
  Filled 2015-02-01: qty 4

## 2015-02-01 MED ORDER — EPINEPHRINE HCL 1 MG/ML IJ SOLN
0.0000 ug/min | INTRAVENOUS | Status: DC
  Administered 2015-02-02: 2 ug/min via INTRAVENOUS
  Administered 2015-02-04: 2.5 ug/min via INTRAVENOUS
  Administered 2015-02-04: 2.507 ug/min via INTRAVENOUS
  Filled 2015-02-01 (×6): qty 4

## 2015-02-01 MED ORDER — DEXMEDETOMIDINE HCL IN NACL 200 MCG/50ML IV SOLN
0.0000 ug/kg/h | INTRAVENOUS | Status: DC
Start: 2015-02-01 — End: 2015-02-02
  Administered 2015-02-01 – 2015-02-02 (×2): 0.7 ug/kg/h via INTRAVENOUS
  Filled 2015-02-01 (×2): qty 50

## 2015-02-01 MED ORDER — LACTATED RINGERS IV SOLN
INTRAVENOUS | Status: DC
  Administered 2015-02-02: 15:00:00 via INTRAVENOUS

## 2015-02-01 MED ORDER — LACTATED RINGERS IV SOLN
INTRAVENOUS | Status: DC
  Administered 2015-02-02 – 2015-02-05 (×4): via INTRAVENOUS

## 2015-02-01 MED ORDER — VANCOMYCIN HCL IN DEXTROSE 750-5 MG/150ML-% IV SOLN: 750.0000 mg | Freq: Two times a day (BID) | INTRAVENOUS | Status: DC

## 2015-02-01 MED ORDER — DEXTROSE 5 % IV SOLN
1.5000 g | Freq: Two times a day (BID) | INTRAVENOUS | Status: AC
  Administered 2015-02-01 – 2015-02-03 (×4): 1.5 g via INTRAVENOUS
  Filled 2015-02-01 (×4): qty 1.5

## 2015-02-01 MED ORDER — LACTATED RINGERS IV SOLN: 500.0000 mL | Freq: Once | INTRAVENOUS | Status: DC | PRN

## 2015-02-01 MED ORDER — PROTAMINE SULFATE 10 MG/ML IV SOLN
INTRAVENOUS | Status: DC | PRN
  Administered 2015-02-01: 160 mg via INTRAVENOUS
  Administered 2015-02-01: 5 mg via INTRAVENOUS
  Administered 2015-02-01: 10 mg via INTRAVENOUS

## 2015-02-01 MED ORDER — EPHEDRINE SULFATE 50 MG/ML IJ SOLN
INTRAMUSCULAR | Status: DC | PRN
  Administered 2015-02-01 (×2): 5 mg via INTRAVENOUS
  Administered 2015-02-01 (×2): 2.5 mg via INTRAVENOUS

## 2015-02-01 MED ORDER — BISACODYL 10 MG RE SUPP
10.0000 mg | Freq: Every day | RECTAL | Status: DC
  Administered 2015-02-03: 10 mg via RECTAL
  Filled 2015-02-01: qty 1

## 2015-02-01 MED ORDER — PHENYLEPHRINE HCL 10 MG/ML IJ SOLN
INTRAMUSCULAR | Status: DC | PRN
  Administered 2015-02-01: 40 ug via INTRAVENOUS
  Administered 2015-02-01: 80 ug via INTRAVENOUS

## 2015-02-01 MED ORDER — MIDAZOLAM HCL 5 MG/5ML IJ SOLN
INTRAMUSCULAR | Status: DC | PRN
  Administered 2015-02-01: 2 mg via INTRAVENOUS
  Administered 2015-02-01: 3 mg via INTRAVENOUS
  Administered 2015-02-01: 2 mg via INTRAVENOUS
  Administered 2015-02-01: 5 mg via INTRAVENOUS
  Administered 2015-02-01 (×2): 2 mg via INTRAVENOUS

## 2015-02-01 MED ORDER — ROCURONIUM BROMIDE 100 MG/10ML IV SOLN
INTRAVENOUS | Status: DC | PRN
  Administered 2015-02-01: 50 mg via INTRAVENOUS

## 2015-02-01 MED ORDER — BISACODYL 5 MG PO TBEC
10.0000 mg | DELAYED_RELEASE_TABLET | Freq: Every day | ORAL | Status: DC
  Filled 2015-02-01: qty 2

## 2015-02-01 MED ORDER — METOPROLOL TARTRATE 12.5 MG HALF TABLET
12.5000 mg | ORAL_TABLET | Freq: Two times a day (BID) | ORAL | Status: DC
  Filled 2015-02-01 (×11): qty 1

## 2015-02-01 MED ORDER — SODIUM CHLORIDE 0.9 % IV SOLN: 250.0000 mL | INTRAVENOUS | Status: DC

## 2015-02-01 MED ORDER — SODIUM CHLORIDE 0.9 % IV SOLN
INTRAVENOUS | Status: DC
  Administered 2015-02-02 – 2015-02-05 (×4): via INTRAVENOUS

## 2015-02-01 MED ORDER — NITROGLYCERIN IN D5W 200-5 MCG/ML-% IV SOLN
0.0000 ug/min | INTRAVENOUS | Status: DC
Start: 2015-02-01 — End: 2015-02-02

## 2015-02-01 MED ORDER — ACETAMINOPHEN 160 MG/5ML PO SOLN: 650.0000 mg | Freq: Once | ORAL | Status: AC

## 2015-02-01 MED ORDER — CETYLPYRIDINIUM CHLORIDE 0.05 % MT LIQD
7.0000 mL | Freq: Two times a day (BID) | OROMUCOSAL | Status: DC
  Administered 2015-02-02 – 2015-02-14 (×24): 7 mL via OROMUCOSAL

## 2015-02-01 MED ORDER — OXYCODONE HCL 5 MG PO TABS: 5.0000 mg | ORAL_TABLET | ORAL | Status: DC | PRN

## 2015-02-01 MED ORDER — ASPIRIN 81 MG PO CHEW
324.0000 mg | CHEWABLE_TABLET | Freq: Every day | ORAL | Status: DC
  Administered 2015-02-02 – 2015-03-15 (×34): 324 mg
  Filled 2015-02-01 (×36): qty 4

## 2015-02-01 MED ORDER — SODIUM CHLORIDE 0.45 % IV SOLN
INTRAVENOUS | Status: DC | PRN
  Administered 2015-02-03 – 2015-02-04 (×2): via INTRAVENOUS

## 2015-02-01 MED ORDER — HEMOSTATIC AGENTS (NO CHARGE) OPTIME
TOPICAL | Status: DC | PRN
  Administered 2015-02-01: 3 via TOPICAL
  Administered 2015-02-01: 1 via TOPICAL

## 2015-02-01 MED ORDER — SODIUM CHLORIDE 0.9 % IV SOLN
INTRAVENOUS | Status: DC
  Administered 2015-02-01: 22:00:00 via INTRAVENOUS
  Filled 2015-02-01 (×2): qty 2.5

## 2015-02-01 MED ORDER — SODIUM CHLORIDE 0.9 % IV SOLN
20.0000 ug | INTRAVENOUS | Status: AC
  Administered 2015-02-01: 20 ug via INTRAVENOUS
  Filled 2015-02-01 (×3): qty 5

## 2015-02-01 MED ORDER — MILRINONE IN DEXTROSE 20 MG/100ML IV SOLN
0.3750 ug/kg/min | INTRAVENOUS | Status: DC
  Administered 2015-02-02: 0.3 ug/kg/min via INTRAVENOUS
  Filled 2015-02-01: qty 100

## 2015-02-01 MED ORDER — MIDAZOLAM HCL 2 MG/2ML IJ SOLN
2.0000 mg | INTRAMUSCULAR | Status: DC | PRN
  Administered 2015-02-01 – 2015-02-03 (×16): 2 mg via INTRAVENOUS
  Filled 2015-02-01 (×18): qty 2

## 2015-02-01 MED ORDER — POTASSIUM CHLORIDE 10 MEQ/50ML IV SOLN
10.0000 meq | Freq: Once | INTRAVENOUS | Status: AC
  Administered 2015-02-01: 10 meq via INTRAVENOUS

## 2015-02-01 MED ORDER — MORPHINE SULFATE (PF) 2 MG/ML IV SOLN
2.0000 mg | INTRAVENOUS | Status: DC | PRN
  Administered 2015-02-02: 4 mg via INTRAVENOUS
  Administered 2015-02-02: 2 mg via INTRAVENOUS
  Filled 2015-02-01 (×3): qty 2
  Filled 2015-02-01 (×2): qty 1

## 2015-02-01 MED ORDER — ONDANSETRON HCL 4 MG/2ML IJ SOLN
4.0000 mg | Freq: Four times a day (QID) | INTRAMUSCULAR | Status: DC | PRN
  Administered 2015-02-17: 4 mg via INTRAVENOUS
  Filled 2015-02-01 (×2): qty 2

## 2015-02-01 MED FILL — Magnesium Sulfate Inj 50%: INTRAMUSCULAR | Qty: 10 | Status: AC

## 2015-02-01 MED FILL — Heparin Sodium (Porcine) Inj 1000 Unit/ML: INTRAMUSCULAR | Qty: 30 | Status: AC

## 2015-02-01 MED FILL — Potassium Chloride Inj 2 mEq/ML: INTRAVENOUS | Qty: 40 | Status: AC

## 2015-02-01 SURGICAL SUPPLY — 159 items
ADAPTER CARDIO PERF ANTE/RETRO (ADAPTER) ×8 IMPLANT
ADH SRG 12 PREFL SYR 3 SPRDR (MISCELLANEOUS) ×2
ADPR PRFSN 84XANTGRD RTRGD (ADAPTER) ×4
ADPR TBG 2 MALE LL ART (MISCELLANEOUS) ×4
APL SKNCLS STERI-STRIP NONHPOA (GAUZE/BANDAGES/DRESSINGS) ×4
APPLICATOR COTTON TIP 6IN STRL (MISCELLANEOUS) IMPLANT
BAG DECANTER FOR FLEXI CONT (MISCELLANEOUS) ×8 IMPLANT
BANDAGE ELASTIC 4 VELCRO ST LF (GAUZE/BANDAGES/DRESSINGS) ×6 IMPLANT
BANDAGE ELASTIC 6 VELCRO ST LF (GAUZE/BANDAGES/DRESSINGS) ×6 IMPLANT
BANDAGE ESMARK 6X9 LF (GAUZE/BANDAGES/DRESSINGS) IMPLANT
BASKET HEART  (ORDER IN 25'S) (MISCELLANEOUS) ×1
BASKET HEART (ORDER IN 25'S) (MISCELLANEOUS) ×1
BASKET HEART (ORDER IN 25S) (MISCELLANEOUS) ×2 IMPLANT
BENZOIN TINCTURE PRP APPL 2/3 (GAUZE/BANDAGES/DRESSINGS) ×4 IMPLANT
BINDER BREAST MEDIUM (GAUZE/BANDAGES/DRESSINGS) ×2 IMPLANT
BLADE 11 SAFETY STRL DISP (BLADE) ×2 IMPLANT
BLADE STERNUM SYSTEM 6 (BLADE) ×8 IMPLANT
BLADE SURG 11 STRL SS (BLADE) ×2 IMPLANT
BLADE SURG 12 STRL SS (BLADE) ×8 IMPLANT
BLADE SURG 15 STRL LF DISP TIS (BLADE) ×2 IMPLANT
BLADE SURG 15 STRL SS (BLADE) ×4
BLADE SURG ROTATE 9660 (MISCELLANEOUS) IMPLANT
BNDG CMPR 9X6 STRL LF SNTH (GAUZE/BANDAGES/DRESSINGS) ×2
BNDG ESMARK 6X9 LF (GAUZE/BANDAGES/DRESSINGS) ×4
BNDG GAUZE ELAST 4 BULKY (GAUZE/BANDAGES/DRESSINGS) ×6 IMPLANT
BOOT SUTURE AID YELLOW STND (SUTURE) IMPLANT
CANISTER SUCTION 2500CC (MISCELLANEOUS) ×8 IMPLANT
CANNULA EZ GLIDE AORTIC 21FR (CANNULA) ×2 IMPLANT
CANNULA GUNDRY RCSP 15FR (MISCELLANEOUS) ×8 IMPLANT
CANNULA RT ANGLE VENOUS 32FR (CANNULA) IMPLANT
CANNULAE RT ANGLE VENOUS 32FR (CANNULA) ×4
CATH CPB KIT VANTRIGT (MISCELLANEOUS) ×4 IMPLANT
CATH ROBINSON RED A/P 18FR (CATHETERS) ×24 IMPLANT
CATH THORACIC 36FR RT ANG (CATHETERS) ×10 IMPLANT
CATH THORACIC 40FR (CATHETERS) ×2 IMPLANT
CLIP FOGARTY SPRING 6M (CLIP) ×4 IMPLANT
CLIP TI LARGE 6 (CLIP) ×10 IMPLANT
CLIP TI WIDE RED SMALL 24 (CLIP) ×4 IMPLANT
CLOSURE STERI-STRIP 1/4X4 (GAUZE/BANDAGES/DRESSINGS) ×4 IMPLANT
CLOSURE WOUND 1/2 X4 (GAUZE/BANDAGES/DRESSINGS) ×1
CONN 1/2X1/2X1/2  BEN (MISCELLANEOUS) ×4
CONN 1/2X1/2X1/2 BEN (MISCELLANEOUS) ×2 IMPLANT
CONN 3/8X1/2 ST GISH (MISCELLANEOUS) ×8 IMPLANT
CONN Y 3/8X3/8X3/8  BEN (MISCELLANEOUS) ×2
CONN Y 3/8X3/8X3/8 BEN (MISCELLANEOUS) IMPLANT
COVER SURGICAL LIGHT HANDLE (MISCELLANEOUS) ×8 IMPLANT
CRADLE DONUT ADULT HEAD (MISCELLANEOUS) ×8 IMPLANT
DRAIN CHANNEL 32F RND 10.7 FF (WOUND CARE) ×4 IMPLANT
DRAPE CARDIOVASCULAR INCISE (DRAPES) ×8
DRAPE SLUSH/WARMER DISC (DRAPES) ×8 IMPLANT
DRAPE SRG 135X102X78XABS (DRAPES) ×4 IMPLANT
DRSG AQUACEL AG ADV 3.5X14 (GAUZE/BANDAGES/DRESSINGS) ×6 IMPLANT
DRSG TEGADERM 4X4.75 (GAUZE/BANDAGES/DRESSINGS) ×6 IMPLANT
DRSG VAC ATS LRG SENSATRAC (GAUZE/BANDAGES/DRESSINGS) ×2 IMPLANT
ELECT BLADE 4.0 EZ CLEAN MEGAD (MISCELLANEOUS) ×4
ELECT BLADE 6.5 EXT (BLADE) ×8 IMPLANT
ELECT CAUTERY BLADE 6.4 (BLADE) ×8 IMPLANT
ELECT REM PT RETURN 9FT ADLT (ELECTROSURGICAL) ×16
ELECTRODE BLDE 4.0 EZ CLN MEGD (MISCELLANEOUS) ×2 IMPLANT
ELECTRODE REM PT RTRN 9FT ADLT (ELECTROSURGICAL) ×8 IMPLANT
GAUZE SPONGE 4X4 12PLY STRL (GAUZE/BANDAGES/DRESSINGS) ×16 IMPLANT
GAUZE XEROFORM 5X9 LF (GAUZE/BANDAGES/DRESSINGS) ×2 IMPLANT
GLOVE BIO SURGEON STRL SZ 6 (GLOVE) ×6 IMPLANT
GLOVE BIO SURGEON STRL SZ 6.5 (GLOVE) IMPLANT
GLOVE BIO SURGEON STRL SZ7.5 (GLOVE) ×20 IMPLANT
GLOVE BIO SURGEONS STRL SZ 6.5 (GLOVE)
GLOVE BIOGEL PI IND STRL 6 (GLOVE) IMPLANT
GLOVE BIOGEL PI IND STRL 6.5 (GLOVE) IMPLANT
GLOVE BIOGEL PI INDICATOR 6 (GLOVE) ×8
GLOVE BIOGEL PI INDICATOR 6.5 (GLOVE) ×6
GOWN STRL REUS W/ TWL LRG LVL3 (GOWN DISPOSABLE) ×16 IMPLANT
GOWN STRL REUS W/TWL LRG LVL3 (GOWN DISPOSABLE) ×32
HEMOSTAT POWDER SURGIFOAM 1G (HEMOSTASIS) ×24 IMPLANT
HEMOSTAT SURGICEL 2X14 (HEMOSTASIS) ×8 IMPLANT
INSERT FOGARTY XLG (MISCELLANEOUS) IMPLANT
IV ADAPTER SYR DOUBLE MALE LL (MISCELLANEOUS) ×4 IMPLANT
KIT BASIN OR (CUSTOM PROCEDURE TRAY) ×8 IMPLANT
KIT ROOM TURNOVER OR (KITS) ×8 IMPLANT
KIT SUCTION CATH 14FR (SUCTIONS) ×12 IMPLANT
KIT TOURNIQUET VASCULAR (KITS) ×4 IMPLANT
KIT VASOVIEW W/TROCAR VH 2000 (KITS) ×4 IMPLANT
LEAD PACING MYOCARDI (MISCELLANEOUS) ×4 IMPLANT
LINE VENT (MISCELLANEOUS) ×2 IMPLANT
MARKER GRAFT CORONARY BYPASS (MISCELLANEOUS) ×12 IMPLANT
NS IRRIG 1000ML POUR BTL (IV SOLUTION) ×40 IMPLANT
PACK OPEN HEART (CUSTOM PROCEDURE TRAY) ×8 IMPLANT
PACK VENTRIC ASSIST CUSTOM (KITS) ×2 IMPLANT
PAD ARMBOARD 7.5X6 YLW CONV (MISCELLANEOUS) ×16 IMPLANT
PAD ELECT DEFIB RADIOL ZOLL (MISCELLANEOUS) ×4 IMPLANT
PENCIL BUTTON HOLSTER BLD 10FT (ELECTRODE) ×4 IMPLANT
PUMP BLOOD CENTRIMAG (PUMP) ×2 IMPLANT
PUNCH AORTIC ROTATE  4.5MM 8IN (MISCELLANEOUS) ×2 IMPLANT
PUNCH AORTIC ROTATE 4.0MM (MISCELLANEOUS) IMPLANT
PUNCH AORTIC ROTATE 4.5MM 8IN (MISCELLANEOUS) IMPLANT
PUNCH AORTIC ROTATE 5MM 8IN (MISCELLANEOUS) IMPLANT
SET CARDIOPLEGIA MPS 5001102 (MISCELLANEOUS) ×2 IMPLANT
SPONGE GAUZE 4X4 12PLY STER LF (GAUZE/BANDAGES/DRESSINGS) ×4 IMPLANT
SPONGE LAP 18X18 X RAY DECT (DISPOSABLE) ×2 IMPLANT
SPONGE LAP 4X18 X RAY DECT (DISPOSABLE) ×2 IMPLANT
STAPLER VISISTAT 35W (STAPLE) ×2 IMPLANT
STRIP CLOSURE SKIN 1/2X4 (GAUZE/BANDAGES/DRESSINGS) ×1 IMPLANT
SUCKER WEIGHTED FLEX (MISCELLANEOUS) ×4 IMPLANT
SURGIFLO W/THROMBIN 8M KIT (HEMOSTASIS) ×4 IMPLANT
SUT BONE WAX W31G (SUTURE) ×8 IMPLANT
SUT ETHIBOND 2 0 SH (SUTURE) ×28 IMPLANT
SUT ETHIBOND 2 0 SH 36X2 (SUTURE) ×4 IMPLANT
SUT ETHIBOND 2 0 V4 (SUTURE) IMPLANT
SUT ETHIBOND 2 0V4 GREEN (SUTURE) IMPLANT
SUT ETHIBOND 4 0 TF (SUTURE) IMPLANT
SUT ETHIBOND 5 0 C 1 30 (SUTURE) IMPLANT
SUT ETHIBOND NAB MH 2-0 36IN (SUTURE) ×10 IMPLANT
SUT MNCRL AB 4-0 PS2 18 (SUTURE) ×2 IMPLANT
SUT PROLENE 3 0 RB 1 (SUTURE) ×4 IMPLANT
SUT PROLENE 3 0 SH 1 (SUTURE) ×4 IMPLANT
SUT PROLENE 3 0 SH DA (SUTURE) ×12 IMPLANT
SUT PROLENE 3 0 SH1 36 (SUTURE) IMPLANT
SUT PROLENE 4 0 RB 1 (SUTURE) ×16
SUT PROLENE 4 0 SH DA (SUTURE) ×8 IMPLANT
SUT PROLENE 4-0 RB1 .5 CRCL 36 (SUTURE) ×6 IMPLANT
SUT PROLENE 5 0 C 1 36 (SUTURE) ×12 IMPLANT
SUT PROLENE 6 0 C 1 30 (SUTURE) ×32 IMPLANT
SUT PROLENE 6 0 CC (SUTURE) ×12 IMPLANT
SUT PROLENE 8 0 BV175 6 (SUTURE) ×4 IMPLANT
SUT PROLENE BLUE 7 0 (SUTURE) ×10 IMPLANT
SUT SILK  1 MH (SUTURE) ×18
SUT SILK 1 MH (SUTURE) IMPLANT
SUT SILK 1 TIES 10X30 (SUTURE) ×6 IMPLANT
SUT SILK 2 0 SH CR/8 (SUTURE) ×6 IMPLANT
SUT SILK 2 0 TIES 10X30 (SUTURE) ×2 IMPLANT
SUT SILK 2 0 TIES 17X18 (SUTURE) ×4
SUT SILK 2-0 18XBRD TIE BLK (SUTURE) IMPLANT
SUT SILK 3 0 SH CR/8 (SUTURE) ×2 IMPLANT
SUT SILK 4 0 TIE 10X30 (SUTURE) ×4 IMPLANT
SUT STEEL 6MS V (SUTURE) ×16 IMPLANT
SUT STEEL SZ 6 DBL 3X14 BALL (SUTURE) ×8 IMPLANT
SUT TEM PAC WIRE 2 0 SH (SUTURE) ×2 IMPLANT
SUT VIC AB 1 CTX 18 (SUTURE) ×2 IMPLANT
SUT VIC AB 1 CTX 36 (SUTURE) ×16
SUT VIC AB 1 CTX36XBRD ANBCTR (SUTURE) ×8 IMPLANT
SUT VIC AB 2-0 CT1 27 (SUTURE) ×4
SUT VIC AB 2-0 CT1 TAPERPNT 27 (SUTURE) IMPLANT
SUT VIC AB 2-0 CTX 27 (SUTURE) IMPLANT
SUT VIC AB 3-0 X1 27 (SUTURE) IMPLANT
SUT VICRYL 2 0 J607H (SUTURE) ×4 IMPLANT
SUT VICRYL 3 0 (SUTURE) ×4 IMPLANT
SUTURE E-PAK OPEN HEART (SUTURE) ×8 IMPLANT
SYR 10ML KIT SKIN ADHESIVE (MISCELLANEOUS) ×2 IMPLANT
SYSTEM SAHARA CHEST DRAIN ATS (WOUND CARE) ×8 IMPLANT
TAPE CLOTH SOFT 2X10 (GAUZE/BANDAGES/DRESSINGS) ×2 IMPLANT
TAPE PAPER 2X10 WHT MICROPORE (GAUZE/BANDAGES/DRESSINGS) ×4 IMPLANT
TOWEL OR 17X24 6PK STRL BLUE (TOWEL DISPOSABLE) ×20 IMPLANT
TOWEL OR 17X26 10 PK STRL BLUE (TOWEL DISPOSABLE) ×16 IMPLANT
TRAY FOLEY IC TEMP SENS 16FR (CATHETERS) ×8 IMPLANT
TUBE CONNECTING 12'X1/4 (SUCTIONS) ×1
TUBE CONNECTING 12X1/4 (SUCTIONS) ×1 IMPLANT
TUBING INSUFFLATION (TUBING) ×4 IMPLANT
UNDERPAD 30X30 INCONTINENT (UNDERPADS AND DIAPERS) ×8 IMPLANT
WATER STERILE IRR 1000ML POUR (IV SOLUTION) ×14 IMPLANT
YANKAUER SUCT BULB TIP NO VENT (SUCTIONS) ×2 IMPLANT

## 2015-02-01 NOTE — OR Nursing (Signed)
SICU Calls 1st call @ 1415; 2nd call @ 1528; 3rd call @ 1617 and final call       Aletta Edmunds,RN

## 2015-02-01 NOTE — Progress Notes (Signed)
The patient was examined and preop studies reviewed. There has been no change from the prior exam and the patient is ready for surgery.   Plan CABG and possible MVR on M Cleek

## 2015-02-01 NOTE — Anesthesia Postprocedure Evaluation (Signed)
  Anesthesia Post-op Note  Patient: Breanna Alvarez  Procedure(s) Performed: Procedure(s): CORONARY ARTERY BYPASS GRAFTING (CABG) X 4 UTILIZING THE LEFT INTERNAL MAMMARY ARTERY TO LAD, ENDOSCOPICALLY HARVESTED BILATERAL SAPHENEOUS VEIN GRAFTS  TO DIAGONAL, OM AND PD. (N/A) TRANSESOPHAGEAL ECHOCARDIOGRAM (TEE) (N/A) PLACEMENT OF CENTRIMAG VENTRICULAR ASSIST DEVICE (N/A)  Patient Location: SICU  Anesthesia Type:General  Level of Consciousness: sedated and Patient remains intubated per anesthesia plan  Airway and Oxygen Therapy: Patient remains intubated per anesthesia plan and Patient placed on Ventilator (see vital sign flow sheet for setting)  Post-op Pain: none  Post-op Assessment: Post-op Vital signs reviewed, Patient's Cardiovascular Status Stable, Respiratory Function Stable and Pain level controlled              Post-op Vital Signs: stable  Last Vitals:  Filed Vitals:   02/01/15 2315  BP:   Pulse: 91  Temp: 37.6 C  Resp: 14    Complications: No apparent anesthesia complications

## 2015-02-01 NOTE — Progress Notes (Signed)
      [address]       [place] [address]             [phone]      S/p CABG/ LVAD  Intubated sedated  BP 88/68 mmHg  Pulse 90  Temp(Src) 97.5 F (36.4 C) (Core (Comment))  Resp 8  Ht 5' (1.524 m)  Wt 100 lb 4.8 oz (45.496 kg)  BMI 19.59 kg/m2  SpO2 100%  LVAD 2.5- 3.0 L flows, index 2.02  Intake/Output Summary (Last 24 hours) at 02/01/15 2050 Last data filed at 02/01/15 2000  Gross per 24 hour  Intake 8224.52 ml  Output   3325 ml  Net 4899.52 ml   Moderated CT output  Thrombocytopenia- platelet transfusion underway  Viviann Spare C. Dorris Fetch, MD [facility] [phone]

## 2015-02-01 NOTE — Progress Notes (Signed)
ANTIBIOTIC CONSULT NOTE - INITIAL  Pharmacy Consult for vancomycin Indication: Surgical prophylaxis for Thoratec Centrimag Blood Pump  Allergies  Allergen Reactions  . Atorvastatin Other (See Comments)    Gas, chest tightness  . Crestor [Rosuvastatin Calcium] Other (See Comments)    Muscle aches    Patient Measurements: Height: 5' (152.4 cm) Weight: 100 lb 4.8 oz (45.496 kg) (Scale A) IBW/kg (Calculated) : 45.5 Adjusted Body Weight:   Vital Signs: BP: 120/101 mmHg (09/26 1833) Pulse Rate: 90 (09/26 1833) Intake/Output from previous day: 09/25 0701 - 09/26 0700 In: 1104 [P.O.:960; I.V.:144] Out: 750 [Urine:750] Intake/Output from this shift:    Labs:  Recent Labs  01/31/15 0315 02/01/15 0241  02/01/15 1202  02/01/15 1408 02/01/15 1458 02/01/15 1544 02/01/15 1844  WBC 11.4* 10.8*  --   --   --   --   --   --  8.4  HGB 9.0* 8.4*  < > 9.7*  < > 6.8* 6.8* 8.8* 9.5*  PLT 255 230  --  110*  --   --   --   --  PENDING  CREATININE 1.07* 0.95  < >  --   < > 0.50 0.60 0.70  --   < > = values in this interval not displayed. Estimated Creatinine Clearance: 52.4 mL/min (by C-G formula based on Cr of 0.7). No results for input(s): VANCOTROUGH, VANCOPEAK, VANCORANDOM, GENTTROUGH, GENTPEAK, GENTRANDOM, TOBRATROUGH, TOBRAPEAK, TOBRARND, AMIKACINPEAK, AMIKACINTROU, AMIKACIN in the last 72 hours.   Microbiology: Recent Results (from the past 720 hour(s))  Surgical pcr screen     Status: None   Collection Time: 01/30/15  2:23 PM  Result Value Ref Range Status   MRSA, PCR NEGATIVE NEGATIVE Final   Staphylococcus aureus NEGATIVE NEGATIVE Final    Comment:        The Xpert SA Assay (FDA approved for NASAL specimens in patients over 92 years of age), is one component of a comprehensive surveillance program.  Test performance has been validated by [facility] for patients greater than or equal to 36 year old. It is not intended to diagnose infection nor to guide or monitor  treatment.   Surgical pcr screen     Status: None   Collection Time: 01/31/15  8:08 PM  Result Value Ref Range Status   MRSA, PCR NEGATIVE NEGATIVE Final   Staphylococcus aureus NEGATIVE NEGATIVE Final    Comment:        The Xpert SA Assay (FDA approved for NASAL specimens in patients over 54 years of age), is one component of a comprehensive surveillance program.  Test performance has been validated by [facility] for patients greater than or equal to 86 year old. It is not intended to diagnose infection nor to guide or monitor treatment.     Medical History: Past Medical History  Diagnosis Date  . Hyperlipidemia   . Acute systolic CHF (congestive heart failure), NYHA class 3 07/10/2014  . Hypertension     Medications:  Scheduled:  . [START ON 02/02/2015] acetaminophen  1,000 mg Oral 4 times per day   Or  . [START ON 02/02/2015] acetaminophen (TYLENOL) oral liquid 160 mg/5 mL  1,000 mg Per Tube 4 times per day  . acetaminophen (TYLENOL) oral liquid 160 mg/5 mL  650 mg Per Tube Once   Or  . acetaminophen  650 mg Rectal Once  . [START ON 02/02/2015] aspirin EC  325 mg Oral Daily   Or  . [START ON 02/02/2015] aspirin  324  mg Per Tube Daily  . [START ON 02/02/2015] bisacodyl  10 mg Oral Daily   Or  . [START ON 02/02/2015] bisacodyl  10 mg Rectal Daily  . cefUROXime (ZINACEF)  IV  1.5 g Intravenous Q12H  . [START ON 02/02/2015] docusate sodium  200 mg Oral Daily  . DOPamine  0-5 mcg/kg/min Intravenous To OR  . epinephrine  0-10 mcg/min Intravenous To OR  . famotidine (PEPCID) IV  20 mg Intravenous Q12H  . fluticasone  1 spray Each Nare Daily  . [START ON 02/02/2015] insulin regular  0-10 Units Intravenous TID WC  . magnesium sulfate  4 g Intravenous Once  . metoprolol tartrate  12.5 mg Oral BID   Or  . metoprolol tartrate  12.5 mg Per Tube BID  . mupirocin ointment  1 application Topical BID  . norepinephrine (LEVOPHED) Adult infusion  0-20 mcg/min Intravenous To OR  .  [START ON 02/03/2015] pantoprazole  40 mg Oral Daily  . potassium chloride  10 mEq Intravenous Q1 Hr x 3  . pravastatin  20 mg Oral q1800  . [START ON 02/02/2015] sodium chloride  3 mL Intravenous Q12H  . vancomycin  750 mg Intravenous Q12H   Infusions:  . sodium chloride    . [START ON 02/02/2015] sodium chloride    . sodium chloride    . dexmedetomidine    . insulin (NOVOLIN-R) infusion    . lactated ringers    . lactated ringers    . milrinone    . nitroGLYCERIN    . vasopressin (PITRESSIN) infusion - *FOR SHOCK*     Assessment: 64 yo female will be continued on vancomycin for surgical prophylaxis for Thoratec Centrimag Blood Pump.  CrCl ~53.  Patient had one dose of vancomycin 1250 mg iv x1 at 0711 today.  Patient weighs 45.5 kg  Goal of Therapy:  Vancomycin trough level 10-15 mcg/ml  Plan:  - Vancomycin 750 mg iv q24h, 1st dose at 0800 on 02/02/15 - monitor renal function and check vancomycin trough when it's appropriate  So, Tsz-Yin 02/01/2015,7:13 PM

## 2015-02-01 NOTE — Progress Notes (Signed)
Came to assess pt, unit RN's at bedside fixing lines and assessing patient. Pt is stable at this time. RT will return and continue to monitor patient throughout the night.

## 2015-02-01 NOTE — Anesthesia Procedure Notes (Signed)
Procedure Name: Intubation Performed by: Everlene Balls T Pre-anesthesia Checklist: Patient identified, Emergency Drugs available, Suction available, Patient being monitored and Timeout performed Patient Re-evaluated:Patient Re-evaluated prior to inductionOxygen Delivery Method: Circle system utilized Preoxygenation: Pre-oxygenation with 100% oxygen Intubation Type: IV induction Ventilation: Mask ventilation without difficulty and Oral airway inserted - appropriate to patient size Laryngoscope Size: Mac and 3 Grade View: Grade I Tube type: Subglottic suction tube Tube size: 7.0 mm Number of attempts: 1 Airway Equipment and Method: Stylet Placement Confirmation: ETT inserted through vocal cords under direct vision,  breath sounds checked- equal and bilateral and positive ETCO2 Secured at: 20 cm Tube secured with: Tape Dental Injury: Teeth and Oropharynx as per pre-operative assessment

## 2015-02-01 NOTE — Progress Notes (Deleted)
  Echocardiogram 2D Echocardiogram has been performed.  Breanna Alvarez 02/01/2015, 9:04 AM

## 2015-02-01 NOTE — Transfer of Care (Signed)
Immediate Anesthesia Transfer of Care Note  Patient: Breanna Alvarez  Procedure(s) Performed: Procedure(s): CORONARY ARTERY BYPASS GRAFTING (CABG) X 4 UTILIZING THE LEFT INTERNAL MAMMARY ARTERY TO LAD, ENDOSCOPICALLY HARVESTED BILATERAL SAPHENEOUS VEIN GRAFTS  TO DIAGONAL, OM AND PD. (N/A) TRANSESOPHAGEAL ECHOCARDIOGRAM (TEE) (N/A) PLACEMENT OF CENTRIMAG VENTRICULAR ASSIST DEVICE (N/A)  Patient Location: SICU  Anesthesia Type:General  Level of Consciousness: sedated and Patient remains intubated per anesthesia plan  Airway & Oxygen Therapy: Patient remains intubated per anesthesia plan and Patient placed on Ventilator (see vital sign flow sheet for setting)  Post-op Assessment: Report given to RN and Post -op Vital signs reviewed and stable  Post vital signs: Reviewed and stable  Last Vitals:  Filed Vitals:   02/01/15 1833  BP: 120/101  Pulse: 90  Temp:   Resp: 14    Complications: No apparent anesthesia complications

## 2015-02-01 NOTE — Brief Op Note (Addendum)
01/26/2015 - 02/01/2015  11:52 AM  PATIENT:  Breanna Alvarez  64 y.o. female  PRE-OPERATIVE DIAGNOSIS:  CAD,   Mild-modwerate MR  POST-OPERATIVE DIAGNOSIS:  CAD  PROCEDURE:   CORONARY ARTERY BYPASS GRAFTING x 4 (LIMA-LAD, SVG-D, SVG-OM, SVG-PD) ENDOSCOPIC GREATER SAPHENOUS VEIN HARVEST BILATERAL THIGHS  Placement of centrimag  percutaneous left ventricular assist device  SURGEON:  Kerin Perna, MD  ASSISTANT: Coral Ceo, PA-C  ANESTHESIA:   general  PATIENT CONDITION:  ICU - intubated and hemodynamically stable. sternum left open and chest closed with Esmarch surgical dressing.  PRE-OPERATIVE WEIGHT: 45 kg

## 2015-02-01 NOTE — Progress Notes (Signed)
  Echocardiogram Echocardiogram Transesophageal has been performed.  Breanna Alvarez 02/01/2015, 9:06 AM

## 2015-02-02 ENCOUNTER — Inpatient Hospital Stay (HOSPITAL_COMMUNITY): Payer: Medicaid Other

## 2015-02-02 ENCOUNTER — Encounter (HOSPITAL_COMMUNITY): Payer: Self-pay | Admitting: Cardiothoracic Surgery

## 2015-02-02 DIAGNOSIS — R570 Cardiogenic shock: Secondary | ICD-10-CM

## 2015-02-02 DIAGNOSIS — Z95811 Presence of heart assist device: Secondary | ICD-10-CM

## 2015-02-02 LAB — POCT I-STAT 3, ART BLOOD GAS (G3+)
ACID-BASE DEFICIT: 2 mmol/L (ref 0.0–2.0)
ACID-BASE DEFICIT: 6 mmol/L — AB (ref 0.0–2.0)
Acid-Base Excess: 1 mmol/L (ref 0.0–2.0)
Acid-base deficit: 3 mmol/L — ABNORMAL HIGH (ref 0.0–2.0)
BICARBONATE: 22.5 meq/L (ref 20.0–24.0)
BICARBONATE: 18.8 meq/L — AB (ref 20.0–24.0)
Bicarbonate: 22.3 mEq/L (ref 20.0–24.0)
Bicarbonate: 23.5 mEq/L (ref 20.0–24.0)
O2 SAT: 100 %
O2 SAT: 99 %
O2 SAT: 98 %
O2 SAT: 99 %
PCO2 ART: 33.4 mmHg — AB (ref 35.0–45.0)
PCO2 ART: 39.6 mmHg (ref 35.0–45.0)
PCO2 ART: 29.7 mmHg — AB (ref 35.0–45.0)
PH ART: 7.506 — AB (ref 7.350–7.450)
PO2 ART: 339 mmHg — AB (ref 80.0–100.0)
PO2 ART: 123 mmHg — AB (ref 80.0–100.0)
Patient temperature: 35.3
Patient temperature: 36.9
Patient temperature: 36.7
TCO2: 23 mmol/L (ref 0–100)
TCO2: 24 mmol/L (ref 0–100)
TCO2: 20 mmol/L (ref 0–100)
TCO2: 24 mmol/L (ref 0–100)
pCO2 arterial: 30.9 mmHg — ABNORMAL LOW (ref 35.0–45.0)
pH, Arterial: 7.425 (ref 7.350–7.450)
pH, Arterial: 7.363 (ref 7.350–7.450)
pH, Arterial: 7.389 (ref 7.350–7.450)
pO2, Arterial: 172 mmHg — ABNORMAL HIGH (ref 80.0–100.0)
pO2, Arterial: 97 mmHg (ref 80.0–100.0)

## 2015-02-02 LAB — GLUCOSE, CAPILLARY
GLUCOSE-CAPILLARY: 121 mg/dL — AB (ref 65–99)
GLUCOSE-CAPILLARY: 108 mg/dL — AB (ref 65–99)
GLUCOSE-CAPILLARY: 99 mg/dL (ref 65–99)
GLUCOSE-CAPILLARY: 99 mg/dL (ref 65–99)
GLUCOSE-CAPILLARY: 112 mg/dL — AB (ref 65–99)
GLUCOSE-CAPILLARY: 130 mg/dL — AB (ref 65–99)
Glucose-Capillary: 101 mg/dL — ABNORMAL HIGH (ref 65–99)
Glucose-Capillary: 103 mg/dL — ABNORMAL HIGH (ref 65–99)
Glucose-Capillary: 104 mg/dL — ABNORMAL HIGH (ref 65–99)
Glucose-Capillary: 107 mg/dL — ABNORMAL HIGH (ref 65–99)
Glucose-Capillary: 111 mg/dL — ABNORMAL HIGH (ref 65–99)
Glucose-Capillary: 111 mg/dL — ABNORMAL HIGH (ref 65–99)
Glucose-Capillary: 115 mg/dL — ABNORMAL HIGH (ref 65–99)
Glucose-Capillary: 118 mg/dL — ABNORMAL HIGH (ref 65–99)
Glucose-Capillary: 138 mg/dL — ABNORMAL HIGH (ref 65–99)

## 2015-02-02 LAB — PREPARE CRYOPRECIPITATE
UNIT DIVISION: 0
Unit division: 0

## 2015-02-02 LAB — POCT I-STAT, CHEM 8
BUN: 14 mg/dL (ref 6–20)
BUN: 16 mg/dL (ref 6–20)
BUN: 16 mg/dL (ref 6–20)
CALCIUM ION: 1.14 mmol/L (ref 1.13–1.30)
CHLORIDE: 109 mmol/L (ref 101–111)
CREATININE: 0.9 mg/dL (ref 0.44–1.00)
CREATININE: 1.1 mg/dL — AB (ref 0.44–1.00)
Calcium, Ion: 1.07 mmol/L — ABNORMAL LOW (ref 1.13–1.30)
Calcium, Ion: 1.12 mmol/L — ABNORMAL LOW (ref 1.13–1.30)
Chloride: 111 mmol/L (ref 101–111)
Chloride: 108 mmol/L (ref 101–111)
Creatinine, Ser: 1 mg/dL (ref 0.44–1.00)
GLUCOSE: 175 mg/dL — AB (ref 65–99)
GLUCOSE: 153 mg/dL — AB (ref 65–99)
Glucose, Bld: 118 mg/dL — ABNORMAL HIGH (ref 65–99)
HCT: 29 % — ABNORMAL LOW (ref 36.0–46.0)
HCT: 28 % — ABNORMAL LOW (ref 36.0–46.0)
HEMATOCRIT: 19 % — AB (ref 36.0–46.0)
Hemoglobin: 6.5 g/dL — CL (ref 12.0–15.0)
Hemoglobin: 9.9 g/dL — ABNORMAL LOW (ref 12.0–15.0)
Hemoglobin: 9.5 g/dL — ABNORMAL LOW (ref 12.0–15.0)
Potassium: 4.9 mmol/L (ref 3.5–5.1)
Potassium: 4.2 mmol/L (ref 3.5–5.1)
Potassium: 3.9 mmol/L (ref 3.5–5.1)
SODIUM: 141 mmol/L (ref 135–145)
Sodium: 143 mmol/L (ref 135–145)
Sodium: 143 mmol/L (ref 135–145)
TCO2: 21 mmol/L (ref 0–100)
TCO2: 17 mmol/L (ref 0–100)
TCO2: 21 mmol/L (ref 0–100)

## 2015-02-02 LAB — PROTIME-INR
INR: 1.15 (ref 0.00–1.49)
Prothrombin Time: 14.9 seconds (ref 11.6–15.2)

## 2015-02-02 LAB — POCT ACTIVATED CLOTTING TIME
ACTIVATED CLOTTING TIME: 134 s
ACTIVATED CLOTTING TIME: 153 s
ACTIVATED CLOTTING TIME: 159 s
ACTIVATED CLOTTING TIME: 177 s
ACTIVATED CLOTTING TIME: 177 s
ACTIVATED CLOTTING TIME: 171 s
ACTIVATED CLOTTING TIME: 171 s
Activated Clotting Time: 134 seconds
Activated Clotting Time: >1000 seconds
Activated Clotting Time: 140 seconds
Activated Clotting Time: 165 seconds
Activated Clotting Time: 171 seconds
Activated Clotting Time: 159 seconds
Activated Clotting Time: 177 seconds
Activated Clotting Time: 177 seconds
Activated Clotting Time: 177 seconds

## 2015-02-02 LAB — BASIC METABOLIC PANEL
Anion gap: 7 (ref 5–15)
BUN: 13 mg/dL (ref 6–20)
CALCIUM: 7.4 mg/dL — AB (ref 8.9–10.3)
CO2: 22 mmol/L (ref 22–32)
CREATININE: 1.06 mg/dL — AB (ref 0.44–1.00)
Chloride: 110 mmol/L (ref 101–111)
GFR calc Af Amer: >60 mL/min (ref >60)
GFR, EST NON AFRICAN AMERICAN: 55 mL/min — AB (ref >60)
GLUCOSE: 119 mg/dL — AB (ref 65–99)
Potassium: 4.8 mmol/L (ref 3.5–5.1)
SODIUM: 139 mmol/L (ref 135–145)

## 2015-02-02 LAB — PREPARE FRESH FROZEN PLASMA
UNIT DIVISION: 0
UNIT DIVISION: 0
Unit division: 0
Unit division: 0
Unit division: 0
Unit division: 0

## 2015-02-02 LAB — CBC
HCT: 20.7 % — ABNORMAL LOW (ref 36.0–46.0)
HCT: 28.1 % — ABNORMAL LOW (ref 36.0–46.0)
HEMATOCRIT: 30.1 % — AB (ref 36.0–46.0)
HEMOGLOBIN: 10.5 g/dL — AB (ref 12.0–15.0)
HEMOGLOBIN: 9.9 g/dL — AB (ref 12.0–15.0)
Hemoglobin: 7.1 g/dL — ABNORMAL LOW (ref 12.0–15.0)
MCH: 28.7 pg (ref 26.0–34.0)
MCH: 29.4 pg (ref 26.0–34.0)
MCH: 28.9 pg (ref 26.0–34.0)
MCHC: 34.3 g/dL (ref 30.0–36.0)
MCHC: 34.9 g/dL (ref 30.0–36.0)
MCHC: 35.2 g/dL (ref 30.0–36.0)
MCV: 83.8 fL (ref 78.0–100.0)
MCV: 84.3 fL (ref 78.0–100.0)
MCV: 82.2 fL (ref 78.0–100.0)
PLATELETS: 120 10*3/uL — AB (ref 150–400)
Platelets: 221 10*3/uL (ref 150–400)
Platelets: 166 10*3/uL (ref 150–400)
RBC: 2.47 MIL/uL — ABNORMAL LOW (ref 3.87–5.11)
RBC: 3.57 MIL/uL — ABNORMAL LOW (ref 3.87–5.11)
RBC: 3.42 MIL/uL — AB (ref 3.87–5.11)
RDW: 14.5 % (ref 11.5–15.5)
RDW: 14.7 % (ref 11.5–15.5)
RDW: 14.8 % (ref 11.5–15.5)
WBC: 6.6 10*3/uL (ref 4.0–10.5)
WBC: 8.4 10*3/uL (ref 4.0–10.5)
WBC: 8.8 10*3/uL (ref 4.0–10.5)

## 2015-02-02 LAB — PREPARE PLATELET PHERESIS
Unit division: 0
Unit division: 0
Unit division: 0

## 2015-02-02 LAB — CARBOXYHEMOGLOBIN
Carboxyhemoglobin: 1.6 % — ABNORMAL HIGH (ref 0.5–1.5)
Carboxyhemoglobin: 1.9 % — ABNORMAL HIGH (ref 0.5–1.5)
Methemoglobin: 1.1 % (ref 0.0–1.5)
Methemoglobin: 0.8 % (ref 0.0–1.5)
O2 Saturation: 63.7 %
O2 Saturation: 79 %
Total hemoglobin: 9.4 g/dL — ABNORMAL LOW (ref 12.0–16.0)
Total hemoglobin: 13.6 g/dL (ref 12.0–16.0)

## 2015-02-02 LAB — CREATININE, SERUM
CREATININE: 1.16 mg/dL — AB (ref 0.44–1.00)
GFR, EST AFRICAN AMERICAN: 57 mL/min — AB (ref >60)
GFR, EST NON AFRICAN AMERICAN: 49 mL/min — AB (ref >60)

## 2015-02-02 LAB — APTT: aPTT: 35 seconds (ref 24–37)

## 2015-02-02 LAB — MAGNESIUM
MAGNESIUM: 2.8 mg/dL — AB (ref 1.7–2.4)
MAGNESIUM: 2.4 mg/dL (ref 1.7–2.4)

## 2015-02-02 LAB — HEPARIN LEVEL (UNFRACTIONATED): Heparin Unfractionated: <0.1 IU/mL — ABNORMAL LOW (ref 0.30–0.70)

## 2015-02-02 MED ORDER — SODIUM CHLORIDE 0.9 % IV SOLN
50.0000 ug/h | INTRAVENOUS | Status: DC
  Administered 2015-02-02: 50 ug/h via INTRAVENOUS
  Filled 2015-02-02 (×2): qty 50

## 2015-02-02 MED ORDER — DEXMEDETOMIDINE HCL IN NACL 200 MCG/50ML IV SOLN
0.0000 ug/kg/h | INTRAVENOUS | Status: DC
  Administered 2015-02-02 – 2015-02-03 (×6): 1.2 ug/kg/h via INTRAVENOUS
  Filled 2015-02-02 (×6): qty 50

## 2015-02-02 MED ORDER — MILRINONE IN DEXTROSE 20 MG/100ML IV SOLN
0.2500 ug/kg/min | INTRAVENOUS | Status: DC
  Administered 2015-02-03 – 2015-02-12 (×11): 0.3 ug/kg/min via INTRAVENOUS
  Administered 2015-02-13 – 2015-02-22 (×8): 0.25 ug/kg/min via INTRAVENOUS
  Filled 2015-02-02 (×21): qty 100

## 2015-02-02 MED ORDER — MIDAZOLAM HCL 2 MG/2ML IJ SOLN
2.0000 mg | INTRAMUSCULAR | Status: DC | PRN
  Administered 2015-02-02 – 2015-02-05 (×5): 2 mg via INTRAVENOUS

## 2015-02-02 MED ORDER — FUROSEMIDE 10 MG/ML IJ SOLN
5.0000 mg/h | INTRAVENOUS | Status: DC
  Administered 2015-02-02: 8 mg/h via INTRAVENOUS
  Filled 2015-02-02: qty 25

## 2015-02-02 MED ORDER — INSULIN ASPART 100 UNIT/ML ~~LOC~~ SOLN: 0.0000 [IU] | SUBCUTANEOUS | Status: DC

## 2015-02-02 MED ORDER — POTASSIUM CHLORIDE 10 MEQ/50ML IV SOLN
10.0000 meq | INTRAVENOUS | Status: AC
  Administered 2015-02-02 (×2): 10 meq via INTRAVENOUS

## 2015-02-02 MED ORDER — HEPARIN BOLUS VIA INFUSION
500.0000 [IU] | INTRAVENOUS | Status: DC | PRN
  Administered 2015-02-02 (×3): 500 [IU] via INTRAVENOUS
  Filled 2015-02-02 (×4): qty 1000

## 2015-02-02 MED ORDER — HEPARIN (PORCINE) IN NACL 100-0.45 UNIT/ML-% IJ SOLN: 500.0000 [IU]/h | INTRAMUSCULAR | Status: DC

## 2015-02-02 MED ORDER — MORPHINE SULFATE (PF) 2 MG/ML IV SOLN
1.0000 mg | INTRAVENOUS | Status: AC | PRN
  Administered 2015-02-02 (×2): 4 mg via INTRAVENOUS
  Filled 2015-02-02 (×2): qty 2

## 2015-02-02 MED ORDER — SODIUM CHLORIDE 0.9 % IV SOLN
Freq: Once | INTRAVENOUS | Status: AC
  Administered 2015-02-02: 03:00:00 via INTRAVENOUS

## 2015-02-02 MED ORDER — SODIUM BICARBONATE 8.4 % IV SOLN
50.0000 meq | Freq: Once | INTRAVENOUS | Status: AC
  Administered 2015-02-02: 50 meq via INTRAVENOUS

## 2015-02-02 MED ORDER — INSULIN ASPART 100 UNIT/ML ~~LOC~~ SOLN
0.0000 [IU] | SUBCUTANEOUS | Status: DC
  Administered 2015-02-02 – 2015-02-07 (×12): 2 [IU] via SUBCUTANEOUS
  Administered 2015-02-07 (×2): 4 [IU] via SUBCUTANEOUS

## 2015-02-02 MED ORDER — ALBUMIN HUMAN 5 % IV SOLN
250.0000 mL | INTRAVENOUS | Status: AC | PRN
  Administered 2015-02-03 (×4): 250 mL via INTRAVENOUS
  Filled 2015-02-02 (×5): qty 250

## 2015-02-02 MED ORDER — NITROGLYCERIN IN D5W 200-5 MCG/ML-% IV SOLN
2.0000 ug/min | INTRAVENOUS | Status: DC
  Administered 2015-02-03: 100 ug/min via INTRAVENOUS
  Filled 2015-02-02: qty 250

## 2015-02-02 MED ORDER — HEPARIN (PORCINE) IN NACL 100-0.45 UNIT/ML-% IJ SOLN
500.0000 [IU]/h | INTRAMUSCULAR | Status: DC
  Administered 2015-02-02: 500 [IU]/h via INTRAVENOUS
  Administered 2015-02-03 – 2015-02-04 (×2): 1100 [IU]/h via INTRAVENOUS
  Filled 2015-02-02 (×4): qty 250

## 2015-02-02 MED ORDER — DOPAMINE-DEXTROSE 3.2-5 MG/ML-% IV SOLN
2.5000 ug/kg/min | INTRAVENOUS | Status: DC
  Administered 2015-02-04: 2.5 ug/kg/min via INTRAVENOUS
  Filled 2015-02-02: qty 250

## 2015-02-02 NOTE — Progress Notes (Signed)
1 Day Post-Op Procedure(s) (LRB): CORONARY ARTERY BYPASS GRAFTING (CABG) X 4 UTILIZING THE LEFT INTERNAL MAMMARY ARTERY TO LAD, ENDOSCOPICALLY HARVESTED BILATERAL SAPHENEOUS VEIN GRAFTS  TO DIAGONAL, OM AND PD. 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% Percutaneous center mag left injector system device, cannulation of LV apex and ascending aorta Hemodynamics stable with LVAD flows 3.0 L/m Patient neuro intact Making urine-Lasix drip for diuresis of fluid retention Plan 72 hours of LVAD support IV heparin sliding scale has been started, platelets have slight drop and will check HIT Objective: Vital signs in last 24 hours: Temp:  [96.8 F (36 C)-100.2 F (37.9 C)] 97.9 F (36.6 C) (09/27 2030) Pulse Rate:  [45-93] 88 (09/27 2015) Cardiac Rhythm:  [-] Atrial paced (09/27 2000) Resp:  [0-20] 14 (09/27 2030) BP: (77-184)/(40-158) 78/49 mmHg (09/27 2000) SpO2:  [95 %-100 %] 100 % (09/27 2015) Arterial Line BP: (71-213)/(57-161) 124/115 mmHg (09/27 2030) FiO2 (%):  [40 %-50 %] 40 % (09/27 2000) Weight:  [122 lb 12.7 oz (55.7 kg)] 122 lb 12.7 oz (55.7 kg) (09/27 0400)  Hemodynamic parameters for last 24 hours: PAP: (23-42)/(12-24) 29/18 mmHg CVP:  [13 mmHg-44 mmHg] 18 mmHg CO:  [2.2 L/min-4.6 L/min] 2.8 L/min CI:  [1.9 L/min/m2-3.3 L/min/m2] 2 L/min/m2  Intake/Output from previous day: 09/26 0701 - 09/27 0700 In: 16109.6 [I.V.:4942.2; EAVWU:9811; NG/GT:120; IV Piggyback:1620] Out: 4625 [Urine:3005; Emesis/NG output:150; Drains:100; Chest Tube:1370] Intake/Output this shift: Total I/O In: 134.9 [I.V.:104.9; NG/GT:30] Out: 280 [Urine:250; Chest Tube:30]  Alert on ventilator Breath sounds are coarse bilaterally Extremities warm Abdomen soft Minimal chest tube drainage  Lab Results:  Recent Labs  02/02/15 0626  02/02/15 1639 02/02/15 1655  WBC  8.4  --   --  8.8  HGB 10.5*  < > 9.5* 9.9*  HCT 30.1*  < > 28.0* 28.1*  PLT 166  --   --  120*  < > = values in this interval not displayed. BMET:  Recent Labs  02/01/15 0241  02/02/15 0402 02/02/15 1226 02/02/15 1639 02/02/15 1723  NA 141  < > 139 141 143  --   K 4.5  < > 4.8 4.2 3.9  --   CL 112*  < > 110 111 108  --   CO2 25  --  22  --   --   --   GLUCOSE 123*  < > 119* 175* 153*  --   BUN 17  < > --   CREATININE 0.95  < > 1.06* 1.00 1.10* 1.16*  CALCIUM 8.8*  --  7.4*  --   --   --   < > = values in this interval not displayed.  PT/INR:  Recent Labs  02/02/15 0355  LABPROT 14.9  INR 1.15   ABG    Component Value Date/Time   PHART 7.506* 02/02/2015 1826   HCO3 23.5 02/02/2015 1826   TCO2 24 02/02/2015 1826   ACIDBASEDEF 6.0* 02/02/2015 1232   O2SAT 99.0 02/02/2015 1826   CBG (last 3)   Recent Labs  02/02/15 0731 02/02/15 1624 02/02/15 2001  GLUCAP 112* 138* 130*    Assessment/Plan: S/P Procedure(s) (LRB): CORONARY ARTERY BYPASS GRAFTING (CABG) X 4 UTILIZING THE LEFT INTERNAL MAMMARY ARTERY TO LAD, ENDOSCOPICALLY HARVESTED BILATERAL SAPHENEOUS VEIN GRAFTS  TO DIAGONAL, OM AND PD. (N/A) TRANSESOPHAGEAL ECHOCARDIOGRAM (TEE) (N/A) PLACEMENT OF CENTRIMAG VENTRICULAR ASSIST DEVICE (N/A) Continue with diuresis and  mechanical support to allow LV recovery Hope to decannulate LVAD after 72 hours of support We'll change wound VAC sponge tomorrow  LOS: 7 days    Breanna Alvarez 02/02/2015

## 2015-02-02 NOTE — Progress Notes (Signed)
Advanced Heart Failure Rounding Note   Subjective:    S/p CABG with Centrimag placement 02/01/15  Remains intubated/sedated.  On EPI 1, Milrinone and dopamine. Lasix started this afternoon with brisk response (350 cc/hr)  CVP 14-15 PA 28/15 PCWP 11 Thermo 3.1/2.2  Centrimag 3400rpm Flow 3.2 l/min (I decreased to 3200 and repeated cardiac output which dropped so increased back to 3400)    Objective:   Weight Range:  Vital Signs:   Temp:  [95.4 F (35.2 C)-100.2 F (37.9 C)] 98.2 F (36.8 C) (09/27 1800) Pulse Rate:  [45-93] 89 (09/27 1800) Resp:  [0-20] 14 (09/27 1800) BP: (77-184)/(40-158) 83/47 mmHg (09/27 1800) SpO2:  [95 %-100 %] 100 % (09/27 1800) Arterial Line BP: (71-213)/(56-161) 82/74 mmHg (09/27 1800) FiO2 (%):  [40 %-100 %] 40 % (09/27 1619) Weight:  [55.7 kg (122 lb 12.7 oz)] 55.7 kg (122 lb 12.7 oz) (09/27 0400) Last BM Date: 01/31/15  Weight change: Filed Weights   01/31/15 0558 02/01/15 0532 02/02/15 0400  Weight: 45.45 kg (100 lb 3.2 oz) 45.496 kg (100 lb 4.8 oz) 55.7 kg (122 lb 12.7 oz)    Intake/Output:   Intake/Output Summary (Last 24 hours) at 02/02/15 1807 Last data filed at 02/02/15 1800  Gross per 24 hour  Intake 5246.81 ml  Output   4115 ml  Net 1131.81 ml     Physical Exam: General:  Intubated sedated HEENT: normal x ETT Neck: supple. RIJ swan Cor: Chest open with wound vac. RRR Lungs: clear Abdomen: soft, nontender, + distended. Hypoactive bowel sounds. Extremities: no cyanosis, clubbing, rash, 1+ edema Neuro: intubated/sedated  Telemetry: A paced 90s  Labs: Basic Metabolic Panel:  Recent Labs Lab 01/27/15 0310 01/31/15 0315 02/01/15 0241  02/01/15 1458 02/01/15 1544 02/01/15 1838 02/02/15 0112 02/02/15 0402 02/02/15 1226  NA 143 141 141  < > 140 141 144 143 139 141  K 3.7 3.9 4.5  < > 4.2 3.9 3.3* 4.9 4.8 4.2  CL 111 108 112*  < > 104 106  --  109 110 111  CO2 --   --   --   --   --  22  --     GLUCOSE 109* 113* 123*  < > 123* 155* 111* 118* 119* 175*  BUN 21* 17 17  < > 14 13  --  CREATININE 0.91 1.07* 0.95  < > 0.60 0.70  --  0.90 1.06* 1.00  CALCIUM 9.1 9.0 8.8*  --   --   --   --   --  7.4*  --   MG  --   --   --   --   --   --   --   --  2.8*  --   < > = values in this interval not displayed.  Liver Function Tests:  Recent Labs Lab 01/31/15 0315  AST 21  ALT 20  ALKPHOS 69  BILITOT 0.4  PROT 7.3  ALBUMIN 3.2*   No results for input(s): LIPASE, AMYLASE in the last 168 hours. No results for input(s): AMMONIA in the last 168 hours.  CBC:  Recent Labs Lab 02/01/15 0241  02/01/15 1843 02/01/15 1844 02/02/15 0112 02/02/15 0115 02/02/15 0626 02/02/15 1226 02/02/15 1655  WBC 10.8*  --   --  8.4  --  6.6 8.4  --  8.8  HGB 8.4*  < >  --  9.5* 6.5* 7.1* 10.5* 9.9* 9.9*  HCT 26.2*  < >  --  28.1* 19.0* 20.7* 30.1* 29.0* 28.1*  MCV 82.9  --   --  85.7  --  83.8 84.3  --  82.2  PLT 230  < > 59* 60*  --  221 166  --  120*  < > = values in this interval not displayed.  Cardiac Enzymes:  Recent Labs Lab 01/27/15 1912  TROPONINI 0.48*    BNP: BNP (last 3 results)  Recent Labs  07/10/14 1315  BNP 1059.1*    ProBNP (last 3 results) No results for input(s): PROBNP in the last 8760 hours.    Other results:  Imaging: Dg Chest Port 1 View  02/02/2015   CLINICAL DATA:  Post CABG  EXAM: PORTABLE CHEST 1 VIEW  COMPARISON:  Chest radiograph from one day prior.  FINDINGS: Right rotated chest radiograph. Endotracheal tube tip is 3.0 cm above the carina. Right internal jugular Swan-Ganz catheter terminates over the main pulmonary artery. Enteric tube terminates in the proximal stomach. Bilateral chest tubes and pericardial drains are stable. Stable cardiomediastinal silhouette with mild to moderate cardiomegaly. No pneumothorax. Stable mild pulmonary edema. Small left pleural effusion, likely stable. Stable bibasilar atelectasis, most prominent in the  left lower lobe.  IMPRESSION: 1. Well-positioned lines and tubes.  No pneumothorax. 2. Stable mild congestive heart failure. 3. Stable bibasilar atelectasis most prominent in the left lower lobe with likely small stable left pleural effusion.   Electronically Signed   By: Delbert Phenix M.D.   On: 02/02/2015 07:49   Dg Chest Port 1 View  02/01/2015   CLINICAL DATA:  Status post CABG x 4.  EXAM: PORTABLE CHEST 1 VIEW  COMPARISON:  Earlier this day at 1651 hour  FINDINGS: Endotracheal tube is 3.3 cm from the carina. Enteric tube in place, tip and side port below the diaphragm in the stomach. Tip of the right internal jugular Swan-Ganz catheter in the region of the distal pulmonary outflow tract/proximal right pulmonary artery. Bilateral chest tubes in place. Overlying tubing and support apparatus over the lower mediastinum. Multiple surgical clips. Lung volumes are low. Heart is mildly enlarged. No definite pneumothorax. No pulmonary edema. Minimal blunting of both costophrenic angles.  IMPRESSION: 1. Support apparatus as described. 2. Low lung volumes. Minimal blunting of both costophrenic angles, possibly small effusions. No definite pneumothorax with bilateral chest tubes in place.   Electronically Signed   By: Rubye Oaks M.D.   On: 02/01/2015 19:18   Dg Chest Port 1 View  02/01/2015   CLINICAL DATA:  Incorrect needle count following CABG x 4.  EXAM: PORTABLE CHEST 1 VIEW  COMPARISON:  01/30/2015.  FINDINGS: Interval post CABG changes with mediastinal and bilateral chest tubes. Surgical clips in the chest and upper abdomen. No radiopaque needle seen.  Support tubing and devices overlying the chest. The endotracheal tube tip is obscured by an overlying device. The right jugular Swan-Ganz catheter tip is in the proximal right main pulmonary artery. Poor inspiration with grossly clear lungs. No pneumothorax seen.  IMPRESSION: No acute abnormality and no visible needle.   Electronically Signed   By: Beckie Salts M.D.   On: 02/01/2015 17:05      Medications:     Scheduled Medications: . acetaminophen  1,000 mg Oral 4 times per day   Or  . acetaminophen (TYLENOL) oral liquid 160 mg/5 mL  1,000 mg Per Tube 4 times per day  . antiseptic oral rinse  7 mL Mouth Rinse q12n4p  .  aspirin EC  325 mg Oral Daily   Or  . aspirin  324 mg Per Tube Daily  . bisacodyl  10 mg Oral Daily   Or  . bisacodyl  10 mg Rectal Daily  . cefUROXime (ZINACEF)  IV  1.5 g Intravenous Q12H  . chlorhexidine  15 mL Mouth Rinse BID  . docusate sodium  200 mg Oral Daily  . fluticasone  1 spray Each Nare Daily  . insulin aspart  0-24 Units Subcutaneous 6 times per day  . insulin regular  0-10 Units Intravenous TID WC  . metoprolol tartrate  12.5 mg Oral BID   Or  . metoprolol tartrate  12.5 mg Per Tube BID  . mupirocin ointment  1 application Topical BID  . [START ON 02/03/2015] pantoprazole  40 mg Oral Daily  . pravastatin  20 mg Oral q1800  . sodium chloride  3 mL Intravenous Q12H  . vancomycin  750 mg Intravenous Q24H     Infusions: . sodium chloride 20 mL/hr at 02/02/15 1800  . sodium chloride    . sodium chloride 10 mL/hr at 02/02/15 1800  . dexmedetomidine 1.2 mcg/kg/hr (02/02/15 1800)  . DOPamine    . epinephrine 1.013 mcg/min (02/02/15 1800)  . furosemide (LASIX) infusion 8 mg/hr (02/02/15 1800)  . heparin 1,100 Units/hr (02/02/15 1800)  . insulin (NOVOLIN-R) infusion Stopped (02/02/15 0800)  . lactated ringers 20 mL/hr at 02/02/15 1800  . lactated ringers Stopped (02/02/15 1000)  . milrinone 0.3 mcg/kg/min (02/02/15 1800)  . nitroGLYCERIN    . norepinephrine (LEVOPHED) Adult infusion Stopped (02/02/15 0905)  . vasopressin (PITRESSIN) infusion - *FOR SHOCK*       PRN Medications:  sodium chloride, heparin, metoprolol, midazolam, midazolam, morphine injection, ondansetron (ZOFRAN) IV, oxyCODONE, sodium chloride, traMADol, vasopressin (PITRESSIN) infusion - *FOR SHOCK*   Assessment:   1.  Cardiogenic shock   --s/p CABG with centrimag placement 02/01/15 2. CAD s/p CABG 3. 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 Acute respiratory failure 5. Acute blood loss anemia   Plan/Discussion:     Stable currently. Hemodynamics look ok. Likely mild RV failure so she is preload dependent. Will cut lasix gtt back to /hr for slower diuresis (she is up 22 pounds). Support RV with EPI and milrinone. We will follow.   VAD parameters reviewed personally.   The patient is critically ill with multiple organ systems failure and requires high complexity decision making for assessment and support, frequent evaluation and titration of therapies, application of advanced monitoring technologies and extensive interpretation of multiple databases.   Critical Care Time devoted to patient care services described in this note is 35 Minutes.    Length of Stay: 7   Arvilla Meres MD 02/02/2015, 6:07 PM  Advanced Heart Failure Team Pager [phone] (M-F; 7a - 4p)  Please contact [facility] for night-coverage after hours (4p -7a ) and weekends on [email or web address]

## 2015-02-02 NOTE — Progress Notes (Signed)
Initial Nutrition Assessment  DOCUMENTATION CODES:   Not applicable  INTERVENTION:    If TF started, recommend Vital High Protein formula -- initiate at 15 ml/hr and increase by 10 ml every 4 hours to goal rate of 55 ml/hr  TF regimen to provide 1320 kcals, 115 gm protein, 1103 ml of free water  NUTRITION DIAGNOSIS:   Inadequate oral intake related to inability to eat as evidenced by NPO status  GOAL:   Patient will meet greater than or equal to 90% of their needs  MONITOR:   Vent status, Labs, Weight trends, Skin, I & O's  REASON FOR ASSESSMENT:   Ventilator  ASSESSMENT:   64 y.o. Femalewho had a NSTEMI in early 2016; diagnosed with multivessel CAD; presented for surgical intervention.  Patient s/p procedure 9/26: CORONARY ARTERY BYPASS GRAFTING x 4 PLACEMENT OF CENTRIMAG VENTRICULAR ASSIST DEVICE  Patient is currently intubated on ventilator support -- OGT in place MV: 6.8 L/min Temp (24hrs), Avg:98.1 F (36.7 C), Min:95.4 F (35.2 C), Max:100.2 F (37.9 C)   RD unable to complete Nutrition Focused Physical Exam at this time.  Diet Order:   NPO  Skin:  Wound (see comment) (chest wound VAC)  Last BM:  9/25  Height:   Ht Readings from Last 1 Encounters:  01/26/15 5' (1.524 m)    Weight:   Wt Readings from Last 1 Encounters:  02/02/15 122 lb 12.7 oz (55.7 kg)    Ideal Body Weight:  45.4 kg  BMI:  Body mass index is 23.98 kg/(m^2).  Estimated Nutritional Needs:   Kcal:  1320  Protein:  100-110 gm  Fluid:  per MD  EDUCATION NEEDS:   No education needs identified at this time  Maureen Chatters, RD, LDN Pager #: [phone] After-Hours Pager #: [phone]

## 2015-02-02 NOTE — Op Note (Signed)
Breanna Alvarez, Breanna Alvarez NO.:  [id number]  MEDICAL RECORD NO.:  [id number]  LOCATION:  2S08C                        FACILITY:  [facility]  PHYSICIAN:  Kerin Perna, M.D.  DATE OF BIRTH:  27-Jun-1950  DATE OF PROCEDURE:  02/01/2015 DATE OF DISCHARGE:                              OPERATIVE REPORT   OPERATION: 1. Coronary artery bypass grafting x4 (left internal mammary artery     left anterior descending, saphenous vein graft to diagonal,     saphenous vein graft to obtuse marginal, saphenous vein graft to     right coronary artery). 2. Endoscopic harvest of bilateral greater saphenous vein. 3. Placement of percutaneous temporary CentriMag left ventricular     assist device.  SURGEON:  Kerin Perna, M.D.  ASSISTANT:  Coral Ceo, PA-C.  ANESTHESIA:  General by Dr. Jairo Ben.  PREOPERATIVE DIAGNOSES:  Ischemic cardiomyopathy, severe coronary artery disease, unstable angina, recent non-ST elevation MI, ejection fraction of 15%.  POSTOPERATIVE DIAGNOSES:  Ischemic cardiomyopathy, severe coronary artery disease, unstable angina, recent non-ST elevation MI, ejection fraction of 15%.  CLINICAL NOTE:  The patient is a 64 year old, Asian female, who I first saw in consultation over 6 months ago when she presented with a non-ST elevation MI and cardiac catheterization demonstrated severe multivessel coronary artery disease with ejection fraction of 40%-45%.  I recommended coronary artery bypass grafting at that time, but the patient deferred.  She returns now with a recent admission for recurrent symptoms of unstable angina, positive enzymes, and non-ST elevation MI. A repeat echocardiogram was performed which showed significant decrease in LV function of 20%-25%. She was not re-cath.  I recommended coronary artery bypass grafting again to the patient, but she understood that this would be at increased risk due to her LV dysfunction.  I discussed the major  aspects of surgery again including location of the surgical incisions, the use of general anesthesia and cardiopulmonary bypass, and expected postoperative hospital recovery.  I discussed the potential risks of the operation and risks of bleeding, blood transfusion requirement, infection, stroke, postoperative lung problems including pleural effusion, and death.  After reviewing these issues, she demonstrated her understanding and agreed to proceed with surgery under what I felt was an informed consent.  OPERATIVE FINDINGS: 1. Small but adequate targets. 2. Adequate conduit, but small internal mammary artery. 3. Severe LV dysfunction with a thinned out anterior, apical, and     inferior wall from old MI damage. 4. LV dysfunction requiring left ventricular assist device-CentriMag-     for separation from cardiopulmonary bypass with good RV function.  DESCRIPTION OF PROCEDURE:  The patient was brought to the operating room and placed supine on the table, and general anesthesia was induced under invasive hemodynamic monitoring.  The chest, abdomen, and legs were prepped with Betadine and draped as a sterile field.  A proper time-out was performed.  A sternal incision was made as the saphenous vein was harvested endoscopically from both legs.  The left internal mammary artery was harvested as a pedicle graft from its origin at the subclavian vessels.  It was a small vessel, but had good flow.  The sternal retractor was placed and the  pericardium was opened and suspended.  Pursestrings were placed in the ascending aorta and right atrium, and the patient was cannulated and placed on cardiopulmonary bypass.  The corners were identified for grafting.  The mammary artery and vein grafts were prepared for the distal anastomoses and cardioplegia cannulas were placed both antegrade and retrograde cold blood cardioplegia.  The patient was cooled to 32 degrees and an aortic crossclamp was applied.   One liter of cold blood cardioplegia was delivered in split doses between the antegrade aorta and retrograde coronary sinus catheters.  There was good cardioplegic arrest and septal temperature dropped less than 15 degrees.  Cardioplegia was delivered every 20 minutes.  The distal coronary anastomoses were performed.  The first distal anastomosis was the right coronary artery.  This was a 1.5-mm vessel. There was a proximal 80%-90% stenosis.  A reverse saphenous vein was sewn end-to-side with running 7-0 Prolene with good flow through the graft.  Cardioplegia was redosed.  The second distal anastomosis was the OM branch of the left circumflex. This was totally occluded.  It was 1.4-mm vessel.  A reverse saphenous vein was sewn end-to-side with running 7-0 Prolene with good flow through the graft.  Cardioplegia was redosed.  The third distal anastomosis was to the second diagonal branch which had a 95% stenosis.  A reverse saphenous vein sewn end-to-side with running 7-0 Prolene with good flow through the graft.  Cardioplegia was redosed.  The fourth distal anastomosis was to the distal LAD.  It had a proximal 95% stenosis.  The left IMA pedicle was brought through an opening and the left lateral pericardium was brought down onto the LAD and sewn end- to-side with running 8-0 Prolene.  There was good flow through the anastomosis after briefly releasing the pedicle bulldog and the mammary artery.  The bulldog was reapplied and pedicle was secured to epicardium.  Cardioplegia was redosed.  While the crossclamp was still in place, 2 proximal vein anastomoses were performed on the ascending aorta.  The aorta was very thickened from atherosclerosis.  A third proximal anastomosis was not possible, so the vein graft to the circumflex was sewn end-to-side to the hood of the vein graft to the diagonal.  The anastomoses were constructed with running 6-0 Prolene and prior to releasing the  crossclamp, air was vented from the coronaries with a dose of retrograde warm blood cardioplegia.  The crossclamp was removed.  The end-to-side vein to vein graft from the circumflex vein to the diagonal vein was then constructed using vascular bulldog and running 7- 0 Prolene.  The bulldog was reapplied and the vein grafts were opened and each had good flow.  The patient was then rewarmed and reperfused.  Temporary pacing wires were applied.  The EKG showed a sinus rhythm without ST-segment changes. Inotropes were started.  The patient was then ventilated on the ventilator and weaned from cardiopulmonary bypass.  LV function showed persistent global hypokinesia.  This did not improve with moderate dosed inotropes, so I put the patient back on full flow.  The patient was too small to place a balloon pump with BSA of 1.3.  I felt the best treatment for her transient LV dysfunction would be a left ventricular assist device as the RV function appeared to be close to normal.  Pursestrings were placed in the ascending aorta in a nondiseased area and in the LV apex and a 7-French arterial inflow cannula was brought to the chest wall and placed into the ascending  aorta above the sino- tubular junction and secured.  A 32-French right-angled venous cannula was placed through the cruciate incision in the apex of the LV, tightened with tourniquets, and brought out through the chest wall. These cannulas were then primed and connected to the circuit of a CentriMag temporary left ventricular assist device.  The patient was then weaned from cardiopulmonary bypass on LV quite easily with excellent hemodynamics and good flows of blood pressure. Protamine was administered without adverse reaction.  Coagulation parameters were checked and platelet count was low and fibrinogen level was low.  The patient was given FFP, cryo, and platelets.  This improved coagulation function.  The cannulas were all  removed.  Attempt was made to close the skin over the open sternum after the chest tubes were placed in the anterior and posterior mediastinum left and right pleural spaces.  However, the VAD flow was not adequate with the skin closed over the sternum and sutures were removed and the sternum and soft tissues were left wide open and covered with an Esmarch dressing over which a wound VAC was placed.  During this period of the operation, the patient remained hemodynamically stable after she was transfused to a hematocrit of 28.  Sterile Esmarch dressings and wound VAC drapes were then placed over the chest and the patient returned to the recovery room in critical, but stable condition.     Kerin Perna, M.D.     PV/MEDQ  D:  02/01/2015  T:  02/02/2015  Job:  [id number]

## 2015-02-02 NOTE — Op Note (Deleted)
NAMEMarland Alvarez  Breanna, Alvarez NO.:  [id number]  MEDICAL RECORD NO.:  [id number]  LOCATION:  [facility]                       FACILITY:  [facility]  PHYSICIAN:  Kerin Perna, M.D.  DATE OF BIRTH:  [birth year]  DATE OF PROCEDURE:  02/01/2015 DATE OF DISCHARGE:  02/01/2015                              OPERATIVE REPORT   OPERATION: 1. Coronary artery bypass grafting x4 (left internal mammary artery     left anterior descending, saphenous vein graft to diagonal,     saphenous vein graft to obtuse marginal, saphenous vein graft to     right coronary artery). 2. Endoscopic harvest of bilateral greater saphenous vein. 3. Placement of percutaneous temporary CentriMag left ventricular     assist device.  SURGEON:  Kerin Perna, M.D.  ASSISTANT:  Coral Ceo, PA-C.  ANESTHESIA:  General by Dr. Jairo Ben.  PREOPERATIVE DIAGNOSES:  Ischemic cardiomyopathy, severe coronary artery disease, unstable angina, recent non-ST elevation MI, ejection fraction of 15%.  POSTOPERATIVE DIAGNOSES:  Ischemic cardiomyopathy, severe coronary artery disease, unstable angina, recent non-ST elevation MI, ejection fraction of 15%.  CLINICAL NOTE:  The patient is a 64 year old, Asian female, who I first saw in consultation over 6 months ago when she presented with a non-ST elevation MI and cardiac catheterization demonstrated severe multivessel coronary artery disease with ejection fraction of 40%-45%.  I recommended coronary artery bypass grafting at that time, but the patient deferred.  She returns now with a recent admission for recurrent symptoms of unstable angina, positive enzymes, and non-ST elevation MI. A repeat echocardiogram was performed which showed significant decrease in LV function of 20%-25%. She was not re-cath.  I recommended coronary artery bypass grafting again to the patient, but she understood that this would be at increased risk due to her LV dysfunction.  I  discussed the major aspects of surgery again including location of the surgical incisions, the use of general anesthesia and cardiopulmonary bypass, and expected postoperative hospital recovery.  I discussed the potential risks of the operation and risks of bleeding, blood transfusion requirement, infection, stroke, postoperative lung problems including pleural effusion, and death.  After reviewing these issues, she demonstrated her understanding and agreed to proceed with surgery under what I felt was an informed consent.  OPERATIVE FINDINGS: 1. Small but adequate targets. 2. Adequate conduit, but small internal mammary artery. 3. Severe LV dysfunction with a thinned out anterior, apical, and     inferior wall from old MI damage. 4. LV dysfunction requiring left ventricular assist device-CentriMag-     for separation from cardiopulmonary bypass with good RV function.  DESCRIPTION OF PROCEDURE:  The patient was brought to the operating room and placed supine on the table, and general anesthesia was induced under invasive hemodynamic monitoring.  The chest, abdomen, and legs were prepped with Betadine and draped as a sterile field.  A proper time-out was performed.  A sternal incision was made as the saphenous vein was harvested endoscopically from both legs.  The left internal mammary artery was harvested as a pedicle graft from its origin at the subclavian vessels.  It was a small vessel, but had good flow.  The sternal retractor was placed and  the pericardium was opened and suspended.  Pursestrings were placed in the ascending aorta and right atrium, and the patient was cannulated and placed on cardiopulmonary bypass.  The corners were identified for grafting.  The mammary artery and vein grafts were prepared for the distal anastomoses and cardioplegia cannulas were placed both antegrade and retrograde cold blood cardioplegia.  The patient was cooled to 32 degrees and an  aortic crossclamp was applied.  One liter of cold blood cardioplegia was delivered in split doses between the antegrade aorta and retrograde coronary sinus catheters.  There was good cardioplegic arrest and septal temperature dropped less than 15 degrees.  Cardioplegia was delivered every 20 minutes.  The distal coronary anastomoses were performed.  The first distal anastomosis was the right coronary artery.  This was a 1.5-mm vessel. There was a proximal 80%-90% stenosis.  A reverse saphenous vein was sewn end-to-side with running 7-0 Prolene with good flow through the graft.  Cardioplegia was redosed.  The second distal anastomosis was the OM branch of the left circumflex. This was totally occluded.  It was 1.4-mm vessel.  A reverse saphenous vein was sewn end-to-side with running 7-0 Prolene with good flow through the graft.  Cardioplegia was redosed.  The third distal anastomosis was to the second diagonal branch which had a 95% stenosis.  A reverse saphenous vein sewn end-to-side with running 7-0 Prolene with good flow through the graft.  Cardioplegia was redosed.  The fourth distal anastomosis was to the distal LAD.  It had a proximal 95% stenosis.  The left IMA pedicle was brought through an opening and the left lateral pericardium was brought down onto the LAD and sewn end- to-side with running 8-0 Prolene.  There was good flow through the anastomosis after briefly releasing the pedicle bulldog and the mammary artery.  The bulldog was reapplied and pedicle was secured to epicardium.  Cardioplegia was redosed.  While the crossclamp was still in place, 2 proximal vein anastomoses were performed on the ascending aorta.  The aorta was very thickened from atherosclerosis.  A third proximal anastomosis was not possible, so the vein graft to the circumflex was sewn end-to-side to the hood of the vein graft to the diagonal.  The anastomoses were constructed with running 6-0 Prolene  and prior to releasing the crossclamp, air was vented from the coronaries with a dose of retrograde warm blood cardioplegia.  The crossclamp was removed.  The end-to-side vein to vein graft from the circumflex vein to the diagonal vein was then constructed using vascular bulldog and running 7- 0 Prolene.  The bulldog was reapplied and the vein grafts were opened and each had good flow.  The patient was then rewarmed and reperfused.  Temporary pacing wires were applied.  The EKG showed a sinus rhythm without ST-segment changes. Inotropes were started.  The patient was then ventilated on the ventilator and weaned from cardiopulmonary bypass.  LV function showed persistent global hypokinesia.  This did not improve with moderate dosed inotropes, so I put the patient back on full flow.  The patient was too small to place a balloon pump with BSA of 1.3.  I felt the best treatment for her transient LV dysfunction would be a left ventricular assist device as the RV function appeared to be close to normal.  Pursestrings were placed in the ascending aorta in a nondiseased area and in the LV apex and a 7-French arterial inflow cannula was brought to the chest wall and placed into the  ascending aorta above the sino- tubular junction and secured.  A 32-French right-angled venous cannula was placed through the cruciate incision in the apex of the LV, tightened with tourniquets, and brought out through the chest wall. These cannulas were then primed and connected to the circuit of a CentriMag temporary left ventricular assist device.  The patient was then weaned from cardiopulmonary bypass on LV quite easily with excellent hemodynamics and good flows of blood pressure. Protamine was administered without adverse reaction.  Coagulation parameters were checked and platelet count was low and fibrinogen level was low.  The patient was given FFP, cryo, and platelets.  This improved coagulation function.   The cannulas were all removed.  Attempt was made to close the skin over the open sternum after the chest tubes were placed in the anterior and posterior mediastinum left and right pleural spaces.  However, the VAD flow was not adequate with the skin closed over the sternum and sutures were removed and the sternum and soft tissues were left wide open and covered with an Esmarch dressing over which a wound VAC was placed.  During this period of the operation, the patient remained hemodynamically stable after she was transfused to a hematocrit of 28.  Sterile Esmarch dressings and wound VAC drapes were then placed over the chest and the patient returned to the recovery room in critical, but stable condition.     Kerin Perna, M.D.     PV/MEDQ  D:  02/01/2015  T:  02/02/2015  Job:  [id number]

## 2015-02-02 NOTE — Clinical Documentation Improvement (Signed)
Cardiothoracic  Abnormal Lab/Test Results:   H/H: 9/27: 10.5/30.1. 9/27:   7.1/20.7. 9/26:   6.8/20.0. 9/20:  10.3/32.4.   Possible Clinical Conditions associated with below indicators  Acute Blood Loss Anemia  Acute on Chronic Blood Loss Anemia  Other Condition  Cannot Clinically Determine   Treatment Provided: PRBC: 1005 ml per 9/26 Anesthesia record. Cell saver: 650 ml per 9/26 Anesthesia record. Albumin: 750 ml per 9/26 Anesthesia record.  Please exercise your independent, professional judgment when responding. A specific answer is not anticipated or expected.   Thank Sabino [facility] [employer] [facility] [employer] [phone]

## 2015-02-03 ENCOUNTER — Inpatient Hospital Stay (HOSPITAL_COMMUNITY): Payer: Medicaid Other

## 2015-02-03 DIAGNOSIS — I5021 Acute systolic (congestive) heart failure: Secondary | ICD-10-CM

## 2015-02-03 LAB — CBC
HCT: 27.5 % — ABNORMAL LOW (ref 36.0–46.0)
HCT: 20.8 % — ABNORMAL LOW (ref 36.0–46.0)
Hemoglobin: 9.6 g/dL — ABNORMAL LOW (ref 12.0–15.0)
Hemoglobin: 7.4 g/dL — ABNORMAL LOW (ref 12.0–15.0)
MCH: 28.7 pg (ref 26.0–34.0)
MCH: 29 pg (ref 26.0–34.0)
MCHC: 34.9 g/dL (ref 30.0–36.0)
MCHC: 35.6 g/dL (ref 30.0–36.0)
MCV: 82.1 fL (ref 78.0–100.0)
MCV: 81.6 fL (ref 78.0–100.0)
Platelets: 127 10*3/uL — ABNORMAL LOW (ref 150–400)
Platelets: 88 10*3/uL — ABNORMAL LOW (ref 150–400)
RBC: 3.35 MIL/uL — ABNORMAL LOW (ref 3.87–5.11)
RBC: 2.55 MIL/uL — ABNORMAL LOW (ref 3.87–5.11)
RDW: 14.9 % (ref 11.5–15.5)
RDW: 14.9 % (ref 11.5–15.5)
WBC: 13 10*3/uL — ABNORMAL HIGH (ref 4.0–10.5)
WBC: 9 10*3/uL (ref 4.0–10.5)

## 2015-02-03 LAB — POCT I-STAT 3, ART BLOOD GAS (G3+)
ACID-BASE EXCESS: 2 mmol/L (ref 0.0–2.0)
ACID-BASE EXCESS: 3 mmol/L — AB (ref 0.0–2.0)
ACID-BASE EXCESS: 2 mmol/L (ref 0.0–2.0)
Acid-Base Excess: 1 mmol/L (ref 0.0–2.0)
BICARBONATE: 22.8 meq/L (ref 20.0–24.0)
BICARBONATE: 22.8 meq/L (ref 20.0–24.0)
BICARBONATE: 25.1 meq/L — AB (ref 20.0–24.0)
BICARBONATE: 25.6 meq/L — AB (ref 20.0–24.0)
Bicarbonate: 25.3 mEq/L — ABNORMAL HIGH (ref 20.0–24.0)
O2 Saturation: 100 %
O2 Saturation: 100 %
O2 Saturation: 100 %
O2 Saturation: 100 %
O2 Saturation: 100 %
PH ART: 7.496 — AB (ref 7.350–7.450)
PH ART: 7.507 — AB (ref 7.350–7.450)
PH ART: 7.511 — AB (ref 7.350–7.450)
PH ART: 7.537 — AB (ref 7.350–7.450)
PO2 ART: 157 mmHg — AB (ref 80.0–100.0)
Patient temperature: 36
TCO2: 24 mmol/L (ref 0–100)
TCO2: 24 mmol/L (ref 0–100)
TCO2: 26 mmol/L (ref 0–100)
TCO2: 26 mmol/L (ref 0–100)
TCO2: 26 mmol/L (ref 0–100)
pCO2 arterial: 26.9 mmHg — ABNORMAL LOW (ref 35.0–45.0)
pCO2 arterial: 28.6 mmHg — ABNORMAL LOW (ref 35.0–45.0)
pCO2 arterial: 30.2 mmHg — ABNORMAL LOW (ref 35.0–45.0)
pCO2 arterial: 31.7 mmHg — ABNORMAL LOW (ref 35.0–45.0)
pCO2 arterial: 32.5 mmHg — ABNORMAL LOW (ref 35.0–45.0)
pH, Arterial: 7.533 — ABNORMAL HIGH (ref 7.350–7.450)
pO2, Arterial: 162 mmHg — ABNORMAL HIGH (ref 80.0–100.0)
pO2, Arterial: 255 mmHg — ABNORMAL HIGH (ref 80.0–100.0)
pO2, Arterial: 292 mmHg — ABNORMAL HIGH (ref 80.0–100.0)
pO2, Arterial: 447 mmHg — ABNORMAL HIGH (ref 80.0–100.0)

## 2015-02-03 LAB — BASIC METABOLIC PANEL
Anion gap: 11 (ref 5–15)
BUN: 16 mg/dL (ref 6–20)
CO2: 20 mmol/L — ABNORMAL LOW (ref 22–32)
Calcium: 8.1 mg/dL — ABNORMAL LOW (ref 8.9–10.3)
Chloride: 105 mmol/L (ref 101–111)
Creatinine, Ser: 1.35 mg/dL — ABNORMAL HIGH (ref 0.44–1.00)
GFR calc Af Amer: 48 mL/min — ABNORMAL LOW (ref >60)
GFR calc non Af Amer: 41 mL/min — ABNORMAL LOW (ref >60)
Glucose, Bld: 154 mg/dL — ABNORMAL HIGH (ref 65–99)
Potassium: 3.8 mmol/L (ref 3.5–5.1)
Sodium: 136 mmol/L (ref 135–145)

## 2015-02-03 LAB — POCT I-STAT 4, (NA,K, GLUC, HGB,HCT)
GLUCOSE: 125 mg/dL — AB (ref 65–99)
Glucose, Bld: 141 mg/dL — ABNORMAL HIGH (ref 65–99)
Glucose, Bld: 158 mg/dL — ABNORMAL HIGH (ref 65–99)
HCT: 26 % — ABNORMAL LOW (ref 36.0–46.0)
HEMATOCRIT: 27 % — AB (ref 36.0–46.0)
HEMATOCRIT: 28 % — AB (ref 36.0–46.0)
HEMOGLOBIN: 8.8 g/dL — AB (ref 12.0–15.0)
HEMOGLOBIN: 9.2 g/dL — AB (ref 12.0–15.0)
Hemoglobin: 9.5 g/dL — ABNORMAL LOW (ref 12.0–15.0)
POTASSIUM: 3.9 mmol/L (ref 3.5–5.1)
Potassium: 3.7 mmol/L (ref 3.5–5.1)
Potassium: 4.3 mmol/L (ref 3.5–5.1)
SODIUM: 144 mmol/L (ref 135–145)
Sodium: 142 mmol/L (ref 135–145)
Sodium: 143 mmol/L (ref 135–145)

## 2015-02-03 LAB — POCT ACTIVATED CLOTTING TIME
ACTIVATED CLOTTING TIME: 171 s
ACTIVATED CLOTTING TIME: 177 s
ACTIVATED CLOTTING TIME: 177 s
ACTIVATED CLOTTING TIME: 177 s
ACTIVATED CLOTTING TIME: 177 s
ACTIVATED CLOTTING TIME: 177 s
ACTIVATED CLOTTING TIME: 177 s
ACTIVATED CLOTTING TIME: 177 s
ACTIVATED CLOTTING TIME: 177 s
ACTIVATED CLOTTING TIME: 183 s
ACTIVATED CLOTTING TIME: 177 s
ACTIVATED CLOTTING TIME: 171 s
ACTIVATED CLOTTING TIME: 165 s
ACTIVATED CLOTTING TIME: 165 s
ACTIVATED CLOTTING TIME: 165 s
ACTIVATED CLOTTING TIME: 165 s
Activated Clotting Time: 159 seconds
Activated Clotting Time: 165 seconds
Activated Clotting Time: 171 seconds
Activated Clotting Time: 171 seconds
Activated Clotting Time: 171 seconds
Activated Clotting Time: 177 seconds
Activated Clotting Time: 177 seconds
Activated Clotting Time: 177 seconds

## 2015-02-03 LAB — GLUCOSE, CAPILLARY
GLUCOSE-CAPILLARY: 140 mg/dL — AB (ref 65–99)
GLUCOSE-CAPILLARY: 100 mg/dL — AB (ref 65–99)
Glucose-Capillary: 107 mg/dL — ABNORMAL HIGH (ref 65–99)
Glucose-Capillary: 137 mg/dL — ABNORMAL HIGH (ref 65–99)
Glucose-Capillary: 139 mg/dL — ABNORMAL HIGH (ref 65–99)
Glucose-Capillary: 143 mg/dL — ABNORMAL HIGH (ref 65–99)

## 2015-02-03 LAB — POCT I-STAT EG7
ACID-BASE DEFICIT: 6 mmol/L — AB (ref 0.0–2.0)
BICARBONATE: 19.9 meq/L — AB (ref 20.0–24.0)
CALCIUM ION: 0.72 mmol/L — AB (ref 1.13–1.30)
HCT: 24 % — ABNORMAL LOW (ref 36.0–46.0)
HEMOGLOBIN: 8.2 g/dL — AB (ref 12.0–15.0)
O2 SAT: 69 %
PH VEN: 7.335 — AB (ref 7.250–7.300)
Potassium: 3.6 mmol/L (ref 3.5–5.1)
SODIUM: 145 mmol/L (ref 135–145)
TCO2: 21 mmol/L (ref 0–100)
pCO2, Ven: 36.7 mmHg — ABNORMAL LOW (ref 45.0–50.0)
pO2, Ven: 35 mmHg (ref 30.0–45.0)

## 2015-02-03 LAB — POCT I-STAT, CHEM 8
BUN: 18 mg/dL (ref 6–20)
BUN: 20 mg/dL (ref 6–20)
CALCIUM ION: 1.12 mmol/L — AB (ref 1.13–1.30)
CHLORIDE: 104 mmol/L (ref 101–111)
CREATININE: 1.2 mg/dL — AB (ref 0.44–1.00)
Calcium, Ion: 1.11 mmol/L — ABNORMAL LOW (ref 1.13–1.30)
Chloride: 104 mmol/L (ref 101–111)
Creatinine, Ser: 1.1 mg/dL — ABNORMAL HIGH (ref 0.44–1.00)
GLUCOSE: 157 mg/dL — AB (ref 65–99)
Glucose, Bld: 114 mg/dL — ABNORMAL HIGH (ref 65–99)
HCT: 22 % — ABNORMAL LOW (ref 36.0–46.0)
HCT: 25 % — ABNORMAL LOW (ref 36.0–46.0)
Hemoglobin: 7.5 g/dL — ABNORMAL LOW (ref 12.0–15.0)
Hemoglobin: 8.5 g/dL — ABNORMAL LOW (ref 12.0–15.0)
POTASSIUM: 3.7 mmol/L (ref 3.5–5.1)
Potassium: 3.9 mmol/L (ref 3.5–5.1)
SODIUM: 140 mmol/L (ref 135–145)
Sodium: 140 mmol/L (ref 135–145)
TCO2: 24 mmol/L (ref 0–100)
TCO2: 23 mmol/L (ref 0–100)

## 2015-02-03 LAB — CARBOXYHEMOGLOBIN
Carboxyhemoglobin: 1.5 % (ref 0.5–1.5)
Methemoglobin: 1.1 % (ref 0.0–1.5)
O2 Saturation: 63.9 %
Total hemoglobin: 11.4 g/dL — ABNORMAL LOW (ref 12.0–16.0)

## 2015-02-03 LAB — PROTIME-INR
INR: 1.66 — ABNORMAL HIGH (ref 0.00–1.49)
Prothrombin Time: 19.6 seconds — ABNORMAL HIGH (ref 11.6–15.2)

## 2015-02-03 LAB — PREPARE RBC (CROSSMATCH)

## 2015-02-03 LAB — TRIGLYCERIDES: Triglycerides: 92 mg/dL (ref <150)

## 2015-02-03 LAB — CALCIUM, IONIZED
Calcium, Ionized, Serum: 4 mg/dL — ABNORMAL LOW (ref 4.5–5.6)
Calcium, Ionized, Serum: 4.3 mg/dL — ABNORMAL LOW (ref 4.5–5.6)

## 2015-02-03 LAB — APTT: aPTT: 183 seconds — ABNORMAL HIGH (ref 24–37)

## 2015-02-03 MED ORDER — METOCLOPRAMIDE HCL 5 MG/ML IJ SOLN
10.0000 mg | Freq: Four times a day (QID) | INTRAMUSCULAR | Status: DC
  Administered 2015-02-03 – 2015-02-17 (×47): 10 mg via INTRAVENOUS
  Filled 2015-02-03 (×72): qty 2

## 2015-02-03 MED ORDER — PANTOPRAZOLE SODIUM 40 MG PO PACK
40.0000 mg | PACK | Freq: Every day | ORAL | Status: DC
  Administered 2015-02-03 – 2015-02-13 (×10): 40 mg
  Filled 2015-02-03 (×12): qty 20

## 2015-02-03 MED ORDER — FUROSEMIDE 10 MG/ML IJ SOLN
4.0000 mg/h | INTRAVENOUS | Status: DC
  Filled 2015-02-03: qty 25

## 2015-02-03 MED ORDER — POTASSIUM CHLORIDE 10 MEQ/50ML IV SOLN
10.0000 meq | INTRAVENOUS | Status: AC
  Administered 2015-02-03 (×2): 10 meq via INTRAVENOUS
  Filled 2015-02-03 (×2): qty 50

## 2015-02-03 MED ORDER — POTASSIUM CHLORIDE 10 MEQ/50ML IV SOLN
10.0000 meq | INTRAVENOUS | Status: AC
  Administered 2015-02-03 (×3): 10 meq via INTRAVENOUS
  Filled 2015-02-03 (×3): qty 50

## 2015-02-03 MED ORDER — HYDROMORPHONE HCL 1 MG/ML IJ SOLN: 1.0000 mg | INTRAMUSCULAR | Status: DC | PRN

## 2015-02-03 MED ORDER — MORPHINE SULFATE 25 MG/ML IV SOLN
10.0000 mg/h | INTRAVENOUS | Status: DC
  Administered 2015-02-04: 4 mg/h via INTRAVENOUS
  Administered 2015-02-06 – 2015-02-07 (×2): 2 mg/h via INTRAVENOUS
  Filled 2015-02-03 (×6): qty 10

## 2015-02-03 MED ORDER — SODIUM CHLORIDE 0.9 % IV SOLN
Freq: Once | INTRAVENOUS | Status: AC
  Administered 2015-02-03: 20:00:00 via INTRAVENOUS

## 2015-02-03 MED ORDER — SODIUM CHLORIDE 0.9 % IV SOLN
Freq: Once | INTRAVENOUS | Status: AC
  Administered 2015-02-03: 10 mL/h via INTRAVENOUS

## 2015-02-03 MED ORDER — VANCOMYCIN HCL 500 MG IV SOLR
500.0000 mg | INTRAVENOUS | Status: DC
  Administered 2015-02-04 – 2015-02-05 (×2): 500 mg via INTRAVENOUS
  Filled 2015-02-03 (×3): qty 500

## 2015-02-03 MED ORDER — ALBUMIN HUMAN 5 % IV SOLN
12.5000 g | INTRAVENOUS | Status: DC | PRN
  Administered 2015-02-03 – 2015-02-04 (×4): 12.5 g via INTRAVENOUS
  Filled 2015-02-03 (×3): qty 250

## 2015-02-03 MED ORDER — PROPOFOL 1000 MG/100ML IV EMUL
5.0000 ug/kg/min | INTRAVENOUS | Status: DC
  Administered 2015-02-03: 40 ug/kg/min via INTRAVENOUS
  Administered 2015-02-04 (×3): 50 ug/kg/min via INTRAVENOUS
  Administered 2015-02-05: 40 ug/kg/min via INTRAVENOUS
  Administered 2015-02-05 – 2015-02-06 (×3): 50 ug/kg/min via INTRAVENOUS
  Administered 2015-02-06: 15 ug/kg/min via INTRAVENOUS
  Filled 2015-02-03 (×13): qty 100

## 2015-02-03 MED ORDER — MORPHINE SULFATE (PF) 4 MG/ML IV SOLN
INTRAVENOUS | Status: AC
  Filled 2015-02-03: qty 1

## 2015-02-03 MED ORDER — SODIUM CHLORIDE 0.9 % IJ SOLN
10.0000 mL | Freq: Two times a day (BID) | INTRAMUSCULAR | Status: DC
  Administered 2015-02-03 – 2015-03-01 (×40): 10 mL
  Administered 2015-03-02: 30 mL
  Administered 2015-03-02 – 2015-03-06 (×8): 10 mL
  Administered 2015-03-07: 30 mL
  Administered 2015-03-07: 10 mL
  Administered 2015-03-08: 30 mL
  Administered 2015-03-08: 20 mL
  Administered 2015-03-09: 10 mL
  Administered 2015-03-09: 30 mL
  Administered 2015-03-10 – 2015-03-13 (×8): 10 mL
  Administered 2015-03-14: 20 mL
  Administered 2015-03-14: 10 mL
  Administered 2015-03-15: 20 mL

## 2015-02-03 MED ORDER — NITROPRUSSIDE SODIUM 25 MG/ML IV SOLN
0.0000 ug/kg/min | INTRAVENOUS | Status: DC
  Administered 2015-02-03: 0.5 ug/kg/min via INTRAVENOUS
  Administered 2015-02-04: 0 ug/kg/min via INTRAVENOUS
  Administered 2015-02-05: 1 ug/kg/min via INTRAVENOUS
  Filled 2015-02-03 (×3): qty 2

## 2015-02-03 MED ORDER — PROPOFOL 1000 MG/100ML IV EMUL
0.0000 ug/kg/min | INTRAVENOUS | Status: DC
  Administered 2015-02-03: 25 ug/kg/min via INTRAVENOUS
  Filled 2015-02-03: qty 100

## 2015-02-03 MED ORDER — MORPHINE SULFATE (PF) 4 MG/ML IV SOLN
INTRAVENOUS | Status: AC
  Administered 2015-02-03: 4 mg
  Filled 2015-02-03: qty 1

## 2015-02-03 MED ORDER — SODIUM CHLORIDE 0.9 % IJ SOLN
10.0000 mL | INTRAMUSCULAR | Status: DC | PRN
  Administered 2015-02-08: 10 mL
  Filled 2015-02-03: qty 40

## 2015-02-03 MED ORDER — DEXTROSE 5 % IV SOLN
1.0000 mg/h | INTRAVENOUS | Status: DC
  Administered 2015-02-03: 4 mg/h via INTRAVENOUS
  Filled 2015-02-03: qty 10

## 2015-02-03 MED FILL — Lidocaine HCl IV Inj 20 MG/ML: INTRAVENOUS | Qty: 5 | Status: AC

## 2015-02-03 MED FILL — Sodium Bicarbonate IV Soln 8.4%: INTRAVENOUS | Qty: 50 | Status: AC

## 2015-02-03 MED FILL — Calcium Chloride Inj 10%: INTRAVENOUS | Qty: 10 | Status: AC

## 2015-02-03 MED FILL — Sodium Chloride IV Soln 0.9%: INTRAVENOUS | Qty: 2000 | Status: AC

## 2015-02-03 MED FILL — Mannitol IV Soln 20%: INTRAVENOUS | Qty: 500 | Status: AC

## 2015-02-03 MED FILL — Electrolyte-R (PH 7.4) Solution: INTRAVENOUS | Qty: 4000 | Status: AC

## 2015-02-03 MED FILL — Heparin Sodium (Porcine) Inj 1000 Unit/ML: INTRAMUSCULAR | Qty: 20 | Status: AC

## 2015-02-03 NOTE — Progress Notes (Signed)
2 Days Post-Op Procedure(s) (LRB): CORONARY ARTERY BYPASS GRAFTING (CABG) X 4 UTILIZING THE LEFT INTERNAL MAMMARY ARTERY TO LAD, ENDOSCOPICALLY HARVESTED BILATERAL SAPHENEOUS VEIN GRAFTS  TO DIAGONAL, OM AND PD. (N/A) TRANSESOPHAGEAL ECHOCARDIOGRAM (TEE) (N/A) PLACEMENT OF CENTRIMAG VENTRICULAR ASSIST DEVICE (N/A) Subjective: Remains stable with good VAD flows Hb stable on systemic heparin ACT 180 Hemodynamics stable with 36hrs VAD support on min pressors  Objective: Vital signs in last 24 hours: Temp:  [96.8 F (36 C)-99.7 F (37.6 C)] 99 F (37.2 C) (09/28 0800) Pulse Rate:  [39-93] 89 (09/28 0800) Cardiac Rhythm:  [-] Atrial paced (09/28 0800) Resp:  [0-20] 14 (09/28 0800) BP: (76-184)/(19-158) 108/86 mmHg (09/28 0800) SpO2:  [95 %-100 %] 100 % (09/28 0800) Arterial Line BP: (81-188)/(70-161) 91/83 mmHg (09/28 0800) FiO2 (%):  [40 %] 40 % (09/28 0800) Weight:  [120 lb 13 oz (54.8 kg)] 120 lb 13 oz (54.8 kg) (09/28 0426)  Hemodynamic parameters for last 24 hours: PAP: (23-42)/(12-20) 23/13 mmHg CVP:  [12 mmHg-44 mmHg] 13 mmHg CO:  [1.6 L/min-4.6 L/min] 3 L/min CI:  [1.9 L/min/m2-3.3 L/min/m2] 2.2 L/min/m2  Intake/Output from previous day: 09/27 0701 - 09/28 0700 In: 3841.7 [I.V.:2601.7; NG/GT:90; IV Piggyback:1150] Out: 5400 [Urine:4005; Emesis/NG output:500; Chest Tube:895] Intake/Output this shift: Total I/O In: 553.9 [I.V.:153.9; IV Piggyback:400] Out: 400 [Urine:325; Emesis/NG output:25; Chest Tube:50]  Lungs clear extrem warm Wound VAC to be removed  Lab Results:  Recent Labs  02/02/15 1655 02/03/15 0005 02/03/15 0405  WBC 8.8  --  13.0*  HGB 9.9* 9.5* 9.6*  HCT 28.1* 28.0* 27.5*  PLT 120*  --  127*   BMET:  Recent Labs  02/02/15 0402  02/02/15 1639 02/02/15 1723 02/03/15 0005 02/03/15 0405  NA 139  < > 143  --  142 136  K 4.8  < > 3.9  --  4.3 3.8  CL 110  < > 108  --   --  105  CO2 22  --   --   --   --  20*  GLUCOSE 119*  < > 153*  --  158*  154*  BUN 13  < > 16  --   --  16  CREATININE 1.06*  < > 1.10* 1.16*  --  1.35*  CALCIUM 7.4*  --   --   --   --  8.1*  < > = values in this interval not displayed.  PT/INR:  Recent Labs  02/03/15 0405  LABPROT 19.6*  INR 1.66*   ABG    Component Value Date/Time   PHART 7.537* 02/03/2015 0608   HCO3 22.8 02/03/2015 0608   TCO2 24 02/03/2015 0608   ACIDBASEDEF 6.0* 02/02/2015 1232   O2SAT 100.0 02/03/2015 0608   CBG (last 3)   Recent Labs  02/02/15 2001 02/03/15 0001 02/03/15 0406  GLUCAP 130* 139* 137*    Assessment/Plan: S/P Procedure(s) (LRB): CORONARY ARTERY BYPASS GRAFTING (CABG) X 4 UTILIZING THE LEFT INTERNAL MAMMARY ARTERY TO LAD, ENDOSCOPICALLY HARVESTED BILATERAL SAPHENEOUS VEIN GRAFTS  TO DIAGONAL, OM AND PD. (N/A) TRANSESOPHAGEAL ECHOCARDIOGRAM (TEE) (N/A) PLACEMENT OF CENTRIMAG VENTRICULAR ASSIST DEVICE (N/A) Diuresis Remove wound VAC Cont VAD support another 48 hrs   LOS: 8 days    Breanna Alvarez 02/03/2015

## 2015-02-03 NOTE — Progress Notes (Signed)
Peripherally Inserted Central Catheter/Midline Placement  The IV Nurse has discussed with the patient and/or persons authorized to consent for the patient, the purpose of this procedure and the potential benefits and risks involved with this procedure.  The benefits include less needle sticks, lab draws from the catheter and patient may be discharged home with the catheter.  Risks include, but not limited to, infection, bleeding, blood clot (thrombus formation), and puncture of an artery; nerve damage and irregular heat beat.  Alternatives to this procedure were also discussed.  PICC/Midline Placement Documentation    Information given to husband.    Franne Grip Renee 02/03/2015, 6:17 PM

## 2015-02-03 NOTE — Plan of Care (Signed)
Problem: Phase II - Intermediate Post-Op Goal: Pain controlled with appropriate interventions Outcome: Progressing Patient able to nod head yes/no when asked about pain.  When patient nods "yes" to pain, pain medication given and on follow up assessment when patient asked about pain patient nods "no".

## 2015-02-03 NOTE — Progress Notes (Signed)
Advanced Heart Failure Rounding Note   Subjective:    S/p CABG with Centrimag placement 02/01/15  Remains intubated/sedated. One epi and milrinone. Diuresed well with lasix drip earlier today. BP very labile. Pulse ox tracking only intermittently.   Has gotten multiple vials of albumin. CVP now 13-14. Centrimag turned down to 3000. Flow now 3.5 L.min    Objective:   Weight Range:  Vital Signs:   Temp:  [96.8 F (36 C)-100.2 F (37.9 C)] 100.2 F (37.9 C) (09/28 2325) Pulse Rate:  [25-96] 90 (09/28 2325) Resp:  [0-18] 14 (09/28 2325) BP: (70-127)/(13-101) 102/87 mmHg (09/28 2325) SpO2:  [72 %-100 %] 96 % (09/28 2325) Arterial Line BP: (55-133)/(53-124) 87/78 mmHg (09/28 2300) FiO2 (%):  [40 %-80 %] 40 % (09/28 2325) Weight:  [54.8 kg (120 lb 13 oz)] 54.8 kg (120 lb 13 oz) (09/28 0426) Last BM Date: 01/31/15  Weight change: Filed Weights   02/01/15 0532 02/02/15 0400 02/03/15 0426  Weight: 45.496 kg (100 lb 4.8 oz) 55.7 kg (122 lb 12.7 oz) 54.8 kg (120 lb 13 oz)    Intake/Output:   Intake/Output Summary (Last 24 hours) at 02/03/15 2341 Last data filed at 02/03/15 2300  Gross per 24 hour  Intake 5418.29 ml  Output   4405 ml  Net 1013.29 ml     Physical Exam: General:  Intubated sedated HEENT: normal x ETT Neck: supple. RIJ swan Cor: Chest open with wound vac. RRR Lungs: clear Abdomen: soft, nontender, + distended. Hypoactive bowel sounds. Extremities: no cyanosis, clubbing, rash, 3+ edema Neuro: intubated/sedated  Telemetry: A paced 90s  Labs: Basic Metabolic Panel:  Recent Labs Lab 01/31/15 0315 02/01/15 0241  02/02/15 0402 02/02/15 1226 02/02/15 1639 02/02/15 1723 02/03/15 0005 02/03/15 0405 02/03/15 1141 02/03/15 1504  NA 141 141  < > 139 141 143  --  142 136 140 140  K 3.9 4.5  < > 4.8 4.2 3.9  --  4.3 3.8 3.7 3.9  CL 108 112*  < > 110 111 108  --   --  105 104 104  CO2 26 25  --  22  --   --   --   --  20*  --   --   GLUCOSE 113*  123*  < > 119* 175* 153*  --  158* 154* 157* 114*  BUN 17 17  < > --   --  CREATININE 1.07* 0.95  < > 1.06* 1.00 1.10* 1.16*  --  1.35* 1.10* 1.20*  CALCIUM 9.0 8.8*  --  7.4*  --   --   --   --  8.1*  --   --   MG  --   --   --  2.8*  --   --  2.4  --   --   --   --   < > = values in this interval not displayed.  Liver Function Tests:  Recent Labs Lab 01/31/15 0315  AST 21  ALT 20  ALKPHOS 69  BILITOT 0.4  PROT 7.3  ALBUMIN 3.2*   No results for input(s): LIPASE, AMYLASE in the last 168 hours. No results for input(s): AMMONIA in the last 168 hours.  CBC:  Recent Labs Lab 02/02/15 0115 02/02/15 0626  02/02/15 1655 02/03/15 0005 02/03/15 0405 02/03/15 1141 02/03/15 1205 02/03/15 1504  WBC 6.6 8.4  --  8.8  --  13.0*  --  9.0  --  HGB 7.1* 10.5*  < > 9.9* 9.5* 9.6* 7.5* 7.4* 8.5*  HCT 20.7* 30.1*  < > 28.1* 28.0* 27.5* 22.0* 20.8* 25.0*  MCV 83.8 84.3  --  82.2  --  82.1  --  81.6  --   PLT 221 166  --  120*  --  127*  --  88*  --   < > = values in this interval not displayed.  Cardiac Enzymes: No results for input(s): CKTOTAL, CKMB, CKMBINDEX, TROPONINI in the last 168 hours.  BNP: BNP (last 3 results)  Recent Labs  07/10/14 1315  BNP 1059.1*    ProBNP (last 3 results) No results for input(s): PROBNP in the last 8760 hours.    Other results:  Imaging: Dg Chest Port 1 View  02/03/2015   CLINICAL DATA:  Status post LVAD.  Decreased oxygen saturation.  EXAM: PORTABLE CHEST 1 VIEW  COMPARISON:  Portable film earlier in the day.  FINDINGS: Mild vascular congestion and slight subsegmental atelectasis without significant volume loss, consolidation, or edema. LVAD device unchanged. BILATERAL chest tubes and mediastinal tube stable. Endotracheal tube stable. Swan-Ganz catheter tip RIGHT lower lobe pulmonary artery. Nasogastric tube in the stomach.  IMPRESSION: Stable chest.  No evidence for significant consolidation or edema.   Electronically  Signed   By: Elsie Stain M.D.   On: 02/03/2015 12:00   Dg Chest Port 1 View  02/03/2015   CLINICAL DATA:  Ventilator.  Postop.  EXAM: PORTABLE CHEST 1 VIEW  COMPARISON:  02/02/2015  FINDINGS: Slight advancement of the Swan-Ganz catheter with the tip in the inferior right pulmonary artery. Bilateral chest tubes without pneumothorax. Endotracheal tube and NG tube are unchanged. Cardiomegaly. Low lung volumes with left base atelectasis. Improving aeration since prior study with decreasing bibasilar atelectasis.  IMPRESSION: Low volumes with left base atelectasis. Improving aeration in the bases since prior study   Electronically Signed   By: Charlett Nose M.D.   On: 02/03/2015 07:36   Dg Chest Port 1 View  02/02/2015   CLINICAL DATA:  Post CABG  EXAM: PORTABLE CHEST 1 VIEW  COMPARISON:  Chest radiograph from one day prior.  FINDINGS: Right rotated chest radiograph. Endotracheal tube tip is 3.0 cm above the carina. Right internal jugular Swan-Ganz catheter terminates over the main pulmonary artery. Enteric tube terminates in the proximal stomach. Bilateral chest tubes and pericardial drains are stable. Stable cardiomediastinal silhouette with mild to moderate cardiomegaly. No pneumothorax. Stable mild pulmonary edema. Small left pleural effusion, likely stable. Stable bibasilar atelectasis, most prominent in the left lower lobe.  IMPRESSION: 1. Well-positioned lines and tubes.  No pneumothorax. 2. Stable mild congestive heart failure. 3. Stable bibasilar atelectasis most prominent in the left lower lobe with likely small stable left pleural effusion.   Electronically Signed   By: Delbert Phenix M.D.   On: 02/02/2015 07:49     Medications:     Scheduled Medications: . acetaminophen  1,000 mg Oral 4 times per day   Or  . acetaminophen (TYLENOL) oral liquid 160 mg/5 mL  1,000 mg Per Tube 4 times per day  . antiseptic oral rinse  7 mL Mouth Rinse q12n4p  . aspirin EC  325 mg Oral Daily   Or  . aspirin   324 mg Per Tube Daily  . bisacodyl  10 mg Oral Daily   Or  . bisacodyl  10 mg Rectal Daily  . chlorhexidine  15 mL Mouth Rinse BID  . docusate sodium  200 mg  Oral Daily  . fluticasone  1 spray Each Nare Daily  . insulin aspart  0-24 Units Subcutaneous 6 times per day  . insulin regular  0-10 Units Intravenous TID WC  . metoCLOPramide (REGLAN) injection  10 mg Intravenous 4 times per day  . metoprolol tartrate  12.5 mg Oral BID   Or  . metoprolol tartrate  12.5 mg Per Tube BID  . mupirocin ointment  1 application Topical BID  . pantoprazole sodium  40 mg Per Tube Daily  . pravastatin  20 mg Oral q1800  . sodium chloride  10-40 mL Intracatheter Q12H  . sodium chloride  3 mL Intravenous Q12H  . [START ON 02/04/2015] vancomycin  500 mg Intravenous Q24H    Infusions: . sodium chloride 20 mL/hr at 02/03/15 2300  . sodium chloride    . sodium chloride 20 mL/hr at 02/03/15 2300  . DOPamine Stopped (02/02/15 1800)  . epinephrine Stopped (02/03/15 1430)  . furosemide (LASIX) infusion    . heparin 1,000 Units/hr (02/03/15 2300)  . insulin (NOVOLIN-R) infusion Stopped (02/02/15 0800)  . lactated ringers Stopped (02/02/15 2000)  . lactated ringers 20 mL/hr at 02/03/15 2300  . milrinone 0.3 mcg/kg/min (02/03/15 2300)  . morphine 4 mg/hr (02/03/15 2300)  . nitroGLYCERIN Stopped (02/03/15 1340)  . nitroPRUSSide Stopped (02/03/15 1645)  . norepinephrine (LEVOPHED) Adult infusion 2 mcg/min (02/03/15 2300)  . propofol (DIPRIVAN) infusion 40 mcg/kg/min (02/03/15 2300)    PRN Medications: sodium chloride, albumin human, heparin, HYDROmorphone (DILAUDID) injection, metoprolol, midazolam, midazolam, ondansetron (ZOFRAN) IV, oxyCODONE, sodium chloride, sodium chloride, traMADol   Assessment:   1. Cardiogenic shock   --s/p CABG with centrimag placement 02/01/15 2. CAD s/p CABG 3. 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 Acute respiratory failure 5. Acute blood loss anemia   Plan/Discussion:    Hemodynamics  labile today. She remains volume overloaded.Renal function stable. Would continue gentle diuresis and tolerate CVP down to 10-12.  Continue milrinone and epi.  VAD parameters reviewed personally.   The patient is critically ill with multiple organ systems failure and requires high complexity decision making for assessment and support, frequent evaluation and titration of therapies, application of advanced monitoring technologies and extensive interpretation of multiple databases.   Critical Care Time devoted to patient care services described in this note is 35 Minutes.  Length of Stay: 8   Bensimhon, Daniel MD 02/03/2015, 11:41 PM  Advanced Heart Failure Team Pager [phone] (M-F; 7a - 4p)  Please contact [facility] for night-coverage after hours (4p -7a ) and weekends on [email or web address]

## 2015-02-03 NOTE — Progress Notes (Signed)
Pt was having elevated MAPs with support from nitroglycering drip, decreased CVPs with increased flows on the vad.  Volume had been admin to compensate for volume lost due to urine output with lasix drip.  Perfusionist was paged to round on the patient and decreased the vad speed from 3400 to 3000 rpms.  Pts flows remained in the 3.4 to 3.6 lpm range with a decrease in the MAPs.  CVP is between 10 to 12 which the perfusionist, Alycia Rossetti, feels is adequate for support.  He did recommend a possible bedside echo to evaluate the left ventricle.    Pt also had an episode of desaturation to the mid 80s per several pulse oximetry sites.  Pt was lavaged and oxygen increased.  No plug was present.  ABG was slightly alkolotic and P02 level was elevated which did not correspond to the pulse ox.  Hgb per istat was 7.5 and lab verification was 7.4.  CXR was repeated and was WNL.    Dr. Maren Beach was relayed the above nformation in the OR.  He agreed to change the nitroglycerin drip to a nipride drip and ordered one unit of blood to be transfused.  He also ordered the lasix drip to be held.  PA pressures were relayed and he did not feel a bedside echo to evaluate the left ventricle was needed at this time.

## 2015-02-03 NOTE — Progress Notes (Signed)
Patient ID: Breanna Alvarez, female   DOB: Sep 26, 1950, 64 y.o.   MRN: [id number]  SICU Evening Rounds:  Hemodynamically stable on Centrimag  3000 RPM, 3.7 LPM   Milrinone 0.3 and levophed 2.5. CI 2.5  Heparin 1000/hr  No bleeding.  Urine output good.  BMET    Component Value Date/Time   NA 140 02/03/2015 1504   K 3.9 02/03/2015 1504   CL 104 02/03/2015 1504   CO2 20* 02/03/2015 0405   GLUCOSE 114* 02/03/2015 1504   BUN 20 02/03/2015 1504   CREATININE 1.20* 02/03/2015 1504   CALCIUM 8.1* 02/03/2015 0405   GFRNONAA 41* 02/03/2015 0405   GFRAA 48* 02/03/2015 0405    CBC    Component Value Date/Time   WBC 9.0 02/03/2015 1205   RBC 2.55* 02/03/2015 1205   HGB 8.5* 02/03/2015 1504   HCT 25.0* 02/03/2015 1504   PLT 88* 02/03/2015 1205   MCV 81.6 02/03/2015 1205   MCH 29.0 02/03/2015 1205   MCHC 35.6 02/03/2015 1205   RDW 14.9 02/03/2015 1205   LYMPHSABS 2.9 07/10/2014 1315   MONOABS 0.5 07/10/2014 1315   EOSABS 0.1 07/10/2014 1315   BASOSABS 0.0 07/10/2014 1315    A/P: stable. Continue current plans

## 2015-02-04 ENCOUNTER — Inpatient Hospital Stay (HOSPITAL_COMMUNITY): Payer: Medicaid Other

## 2015-02-04 LAB — TYPE AND SCREEN
ABO/RH(D): O POS
Antibody Screen: NEGATIVE
Unit division: 0
Unit division: 0
Unit division: 0
Unit division: 0
Unit division: 0
Unit division: 0
Unit division: 0
Unit division: 0
Unit division: 0
Unit division: 0
Unit division: 0
Unit division: 0
Unit division: 0

## 2015-02-04 LAB — POCT I-STAT, CHEM 8
BUN: 22 mg/dL — AB (ref 6–20)
BUN: 24 mg/dL — ABNORMAL HIGH (ref 6–20)
BUN: 24 mg/dL — AB (ref 6–20)
CALCIUM ION: 1.18 mmol/L (ref 1.13–1.30)
CALCIUM ION: 1.16 mmol/L (ref 1.13–1.30)
CHLORIDE: 102 mmol/L (ref 101–111)
CREATININE: 1.3 mg/dL — AB (ref 0.44–1.00)
CREATININE: 1.3 mg/dL — AB (ref 0.44–1.00)
Calcium, Ion: 1.15 mmol/L (ref 1.13–1.30)
Chloride: 104 mmol/L (ref 101–111)
Chloride: 104 mmol/L (ref 101–111)
Creatinine, Ser: 1.3 mg/dL — ABNORMAL HIGH (ref 0.44–1.00)
GLUCOSE: 119 mg/dL — AB (ref 65–99)
GLUCOSE: 141 mg/dL — AB (ref 65–99)
Glucose, Bld: 106 mg/dL — ABNORMAL HIGH (ref 65–99)
HCT: 33 % — ABNORMAL LOW (ref 36.0–46.0)
HCT: 31 % — ABNORMAL LOW (ref 36.0–46.0)
HEMATOCRIT: 33 % — AB (ref 36.0–46.0)
Hemoglobin: 11.2 g/dL — ABNORMAL LOW (ref 12.0–15.0)
Hemoglobin: 11.2 g/dL — ABNORMAL LOW (ref 12.0–15.0)
Hemoglobin: 10.5 g/dL — ABNORMAL LOW (ref 12.0–15.0)
Potassium: 4.6 mmol/L (ref 3.5–5.1)
Potassium: 4.1 mmol/L (ref 3.5–5.1)
Potassium: 4.3 mmol/L (ref 3.5–5.1)
SODIUM: 136 mmol/L (ref 135–145)
Sodium: 137 mmol/L (ref 135–145)
Sodium: 136 mmol/L (ref 135–145)
TCO2: 20 mmol/L (ref 0–100)
TCO2: 20 mmol/L (ref 0–100)
TCO2: 21 mmol/L (ref 0–100)

## 2015-02-04 LAB — COMPREHENSIVE METABOLIC PANEL
ALT: 24 U/L (ref 14–54)
AST: 52 U/L — ABNORMAL HIGH (ref 15–41)
Albumin: 3.3 g/dL — ABNORMAL LOW (ref 3.5–5.0)
Alkaline Phosphatase: 51 U/L (ref 38–126)
Anion gap: 8 (ref 5–15)
BUN: 19 mg/dL (ref 6–20)
CO2: 21 mmol/L — ABNORMAL LOW (ref 22–32)
Calcium: 8.1 mg/dL — ABNORMAL LOW (ref 8.9–10.3)
Chloride: 108 mmol/L (ref 101–111)
Creatinine, Ser: 1.21 mg/dL — ABNORMAL HIGH (ref 0.44–1.00)
GFR calc Af Amer: 54 mL/min — ABNORMAL LOW (ref >60)
GFR calc non Af Amer: 47 mL/min — ABNORMAL LOW (ref >60)
Glucose, Bld: 109 mg/dL — ABNORMAL HIGH (ref 65–99)
Potassium: 3.9 mmol/L (ref 3.5–5.1)
Sodium: 137 mmol/L (ref 135–145)
Total Bilirubin: 1.7 mg/dL — ABNORMAL HIGH (ref 0.3–1.2)
Total Protein: 4.7 g/dL — ABNORMAL LOW (ref 6.5–8.1)

## 2015-02-04 LAB — POCT I-STAT 3, VENOUS BLOOD GAS (G3P V)
Acid-base deficit: 2 mmol/L (ref 0.0–2.0)
BICARBONATE: 21.2 meq/L (ref 20.0–24.0)
O2 Saturation: 99 %
PCO2 VEN: 32.3 mmHg — AB (ref 45.0–50.0)
Patient temperature: 37.2
TCO2: 22 mmol/L (ref 0–100)
pH, Ven: 7.426 — ABNORMAL HIGH (ref 7.250–7.300)
pO2, Ven: 113 mmHg — ABNORMAL HIGH (ref 30.0–45.0)

## 2015-02-04 LAB — CBC
HCT: 26.7 % — ABNORMAL LOW (ref 36.0–46.0)
HCT: 29.6 % — ABNORMAL LOW (ref 36.0–46.0)
HCT: 31.5 % — ABNORMAL LOW (ref 36.0–46.0)
Hemoglobin: 10.3 g/dL — ABNORMAL LOW (ref 12.0–15.0)
Hemoglobin: 10.9 g/dL — ABNORMAL LOW (ref 12.0–15.0)
Hemoglobin: 8.9 g/dL — ABNORMAL LOW (ref 12.0–15.0)
MCH: 27.6 pg (ref 26.0–34.0)
MCH: 28.9 pg (ref 26.0–34.0)
MCH: 29.2 pg (ref 26.0–34.0)
MCHC: 33.3 g/dL (ref 30.0–36.0)
MCHC: 34.6 g/dL (ref 30.0–36.0)
MCHC: 34.8 g/dL (ref 30.0–36.0)
MCV: 82.9 fL (ref 78.0–100.0)
MCV: 82.9 fL (ref 78.0–100.0)
MCV: 84.5 fL (ref 78.0–100.0)
Platelets: 104 10*3/uL — ABNORMAL LOW (ref 150–400)
Platelets: 114 10*3/uL — ABNORMAL LOW (ref 150–400)
Platelets: 69 10*3/uL — ABNORMAL LOW (ref 150–400)
RBC: 3.22 MIL/uL — ABNORMAL LOW (ref 3.87–5.11)
RBC: 3.57 MIL/uL — ABNORMAL LOW (ref 3.87–5.11)
RBC: 3.73 MIL/uL — ABNORMAL LOW (ref 3.87–5.11)
RDW: 16.3 % — ABNORMAL HIGH (ref 11.5–15.5)
RDW: 16.4 % — ABNORMAL HIGH (ref 11.5–15.5)
RDW: 16.4 % — ABNORMAL HIGH (ref 11.5–15.5)
WBC: 10.7 10*3/uL — ABNORMAL HIGH (ref 4.0–10.5)
WBC: 11.8 10*3/uL — ABNORMAL HIGH (ref 4.0–10.5)
WBC: 13.6 10*3/uL — ABNORMAL HIGH (ref 4.0–10.5)

## 2015-02-04 LAB — POCT ACTIVATED CLOTTING TIME
ACTIVATED CLOTTING TIME: 153 s
ACTIVATED CLOTTING TIME: 153 s
ACTIVATED CLOTTING TIME: 159 s
ACTIVATED CLOTTING TIME: 159 s
Activated Clotting Time: 165 seconds
Activated Clotting Time: 153 seconds
Activated Clotting Time: 146 seconds
Activated Clotting Time: 159 seconds
Activated Clotting Time: 165 seconds

## 2015-02-04 LAB — GLUCOSE, CAPILLARY
GLUCOSE-CAPILLARY: 99 mg/dL (ref 65–99)
GLUCOSE-CAPILLARY: 92 mg/dL (ref 65–99)
Glucose-Capillary: 100 mg/dL — ABNORMAL HIGH (ref 65–99)
Glucose-Capillary: 109 mg/dL — ABNORMAL HIGH (ref 65–99)
Glucose-Capillary: 93 mg/dL (ref 65–99)

## 2015-02-04 LAB — POCT I-STAT 3, ART BLOOD GAS (G3+)
ACID-BASE DEFICIT: 2 mmol/L (ref 0.0–2.0)
ACID-BASE DEFICIT: 1 mmol/L (ref 0.0–2.0)
Acid-base deficit: 3 mmol/L — ABNORMAL HIGH (ref 0.0–2.0)
Bicarbonate: 20.9 mEq/L (ref 20.0–24.0)
Bicarbonate: 21.9 mEq/L (ref 20.0–24.0)
Bicarbonate: 23.1 mEq/L (ref 20.0–24.0)
O2 SAT: 100 %
O2 SAT: 99 %
O2 Saturation: 99 %
PCO2 ART: 32.9 mmHg — AB (ref 35.0–45.0)
PH ART: 7.414 (ref 7.350–7.450)
PO2 ART: 176 mmHg — AB (ref 80.0–100.0)
Patient temperature: 37.8
TCO2: 22 mmol/L (ref 0–100)
TCO2: 23 mmol/L (ref 0–100)
TCO2: 24 mmol/L (ref 0–100)
pCO2 arterial: 34 mmHg — ABNORMAL LOW (ref 35.0–45.0)
pCO2 arterial: 35.3 mmHg (ref 35.0–45.0)
pH, Arterial: 7.417 (ref 7.350–7.450)
pH, Arterial: 7.424 (ref 7.350–7.450)
pO2, Arterial: 117 mmHg — ABNORMAL HIGH (ref 80.0–100.0)
pO2, Arterial: 149 mmHg — ABNORMAL HIGH (ref 80.0–100.0)

## 2015-02-04 LAB — APTT
aPTT: 103 seconds — ABNORMAL HIGH (ref 24–37)
aPTT: 61 seconds — ABNORMAL HIGH (ref 24–37)
aPTT: 65 seconds — ABNORMAL HIGH (ref 24–37)
aPTT: 68 seconds — ABNORMAL HIGH (ref 24–37)

## 2015-02-04 LAB — CALCIUM, IONIZED: Calcium, Ionized, Serum: 4.8 mg/dL (ref 4.5–5.6)

## 2015-02-04 LAB — PROTIME-INR
INR: 1.47 (ref 0.00–1.49)
Prothrombin Time: 17.9 seconds — ABNORMAL HIGH (ref 11.6–15.2)

## 2015-02-04 LAB — MAGNESIUM: Magnesium: 1.7 mg/dL (ref 1.7–2.4)

## 2015-02-04 LAB — CARBOXYHEMOGLOBIN
Carboxyhemoglobin: 1.9 % — ABNORMAL HIGH (ref 0.5–1.5)
Methemoglobin: 1.1 % (ref 0.0–1.5)
O2 Saturation: 71.5 %
Total hemoglobin: 8.1 g/dL — ABNORMAL LOW (ref 12.0–16.0)

## 2015-02-04 LAB — HEPARIN INDUCED PLATELET AB (HIT ANTIBODY): Heparin Induced Plt Ab: 0.206 OD (ref 0.000–0.400)

## 2015-02-04 LAB — PREPARE RBC (CROSSMATCH)

## 2015-02-04 MED ORDER — FUROSEMIDE 10 MG/ML IJ SOLN
40.0000 mg | Freq: Two times a day (BID) | INTRAMUSCULAR | Status: DC
  Administered 2015-02-04 – 2015-02-05 (×3): 40 mg via INTRAVENOUS
  Filled 2015-02-04 (×3): qty 4

## 2015-02-04 MED ORDER — ALBUMIN HUMAN 5 % IV SOLN
12.5000 g | INTRAVENOUS | Status: AC | PRN
  Administered 2015-02-05 (×4): 12.5 g via INTRAVENOUS
  Filled 2015-02-04 (×4): qty 250

## 2015-02-04 MED ORDER — EPINEPHRINE HCL 1 MG/ML IJ SOLN: 2.5000 ug/min | INTRAVENOUS | Status: DC

## 2015-02-04 MED ORDER — SODIUM CHLORIDE 0.9 % IV SOLN
0.0500 mg/kg/h | INTRAVENOUS | Status: DC
  Administered 2015-02-05: 0.05 mg/kg/h via INTRAVENOUS
  Filled 2015-02-04 (×2): qty 250

## 2015-02-04 MED ORDER — BIVALIRUDIN 250 MG IV SOLR
0.0500 mg/kg/h | INTRAVENOUS | Status: DC
  Filled 2015-02-04: qty 250

## 2015-02-04 MED ORDER — MAGNESIUM SULFATE 2 GM/50ML IV SOLN
2.0000 g | Freq: Once | INTRAVENOUS | Status: AC
  Administered 2015-02-04: 2 g via INTRAVENOUS
  Filled 2015-02-04: qty 50

## 2015-02-04 MED ORDER — DOPAMINE-DEXTROSE 3.2-5 MG/ML-% IV SOLN: 2.5000 ug/kg/min | INTRAVENOUS | Status: DC

## 2015-02-04 MED ORDER — POTASSIUM CHLORIDE 10 MEQ/50ML IV SOLN
10.0000 meq | INTRAVENOUS | Status: AC
  Administered 2015-02-04 (×2): 10 meq via INTRAVENOUS
  Filled 2015-02-04 (×2): qty 50

## 2015-02-04 MED ORDER — POTASSIUM CHLORIDE 10 MEQ/50ML IV SOLN
10.0000 meq | INTRAVENOUS | Status: AC
  Administered 2015-02-04 (×2): 10 meq via INTRAVENOUS

## 2015-02-04 NOTE — Progress Notes (Signed)
3 Days Post-Op Procedure(s) (LRB): CORONARY ARTERY BYPASS GRAFTING (CABG) X 4 UTILIZING THE LEFT INTERNAL MAMMARY ARTERY TO LAD, ENDOSCOPICALLY HARVESTED BILATERAL SAPHENEOUS VEIN GRAFTS  TO DIAGONAL, OM AND PD. (N/A) TRANSESOPHAGEAL ECHOCARDIOGRAM (TEE) (N/A) PLACEMENT OF CENTRIMAG VENTRICULAR ASSIST DEVICE (N/A) Subjective: Status post CABG 4 with preop ischemic cardiomyopathy Placement of percutaneous left ventricle assist device for separation from cardio point bypass Patient remains stable with excellent center mag LVAD flows, stable hemodynamics. Yesterday after weaning Lasix drip and epinephrine her urine output dropped off in required significant volume in the form of 5% albumen. We'll reinitiate twice a day Lasix dosing and resume low-dose inotropes. We will gently wean the speed on the LVAD to prepare the ventricle for LVAD decannulation tomorrow. The patient remains neurologically intact. Her platelet count dropped significantly yesterday, HIT panel is still pending, we will stop heparin and use bivalirudin infusion for anticoagulation  Objective: Vital signs in last 24 hours: Temp:  [98.2 F (36.8 C)-100.2 F (37.9 C)] 99.1 F (37.3 C) (09/29 1445) Pulse Rate:  [25-130] 92 (09/29 1500) Cardiac Rhythm:  [-] Normal sinus rhythm;Atrial paced (09/29 0800) Resp:  [6-28] 15 (09/29 1500) BP: (84-127)/(77-100) 84/78 mmHg (09/29 1121) SpO2:  [81 %-100 %] 93 % (09/29 1500) Arterial Line BP: (66-313)/(56-310) 115/93 mmHg (09/29 1500) FiO2 (%):  [40 %-60 %] 40 % (09/29 1400) Weight:  [128 lb 8.5 oz (58.3 kg)] 128 lb 8.5 oz (58.3 kg) (09/29 0500)  Hemodynamic parameters for last 24 hours: PAP: (21-46)/(9-21) 35/14 mmHg CVP:  [9 mmHg-23 mmHg] 14 mmHg CO:  [3.4 L/min-5.3 L/min] 4.6 L/min CI:  [2.5 L/min/m2-3.8 L/min/m2] 3.3 L/min/m2  Intake/Output from previous day: 09/28 0701 - 09/29 0700 In: 5600.3 [I.V.:2645.3; Blood:875; NG/GT:180; IV Piggyback:1900] Out: 2805 [Urine:2080;  Emesis/NG output:25; Chest Tube:700] Intake/Output this shift: Total I/O In: 1637.3 [I.V.:793.3; Blood:334; NG/GT:260; IV Piggyback:250] Out: 2030 [Urine:1590; Emesis/NG output:200; Chest Tube:240]  Lungs clear Extremities warm Abdomen soft Chest packing dry and intact  Lab Results:  Recent Labs  02/04/15 0410 02/04/15 1135  WBC 10.7* 13.6*  HGB 8.9* 10.9*  11.2*  HCT 26.7* 31.5*  33.0*  PLT 114* 104*   BMET:  Recent Labs  02/03/15 0405  02/04/15 0410 02/04/15 1135  NA 136  < > 137 137  K 3.8  < > 3.9 4.6  CL 105  < > 108 104  CO2 20*  --  21*  --   GLUCOSE 154*  < > 109* 119*  BUN 16  < > 19 22*  CREATININE 1.35*  < > 1.21* 1.30*  CALCIUM 8.1*  --  8.1*  --   < > = values in this interval not displayed.  PT/INR:  Recent Labs  02/04/15 0410  LABPROT 17.9*  INR 1.47   ABG    Component Value Date/Time   PHART 7.417 02/04/2015 0603   HCO3 21.2 02/04/2015 1140   TCO2 22 02/04/2015 1140   ACIDBASEDEF 2.0 02/04/2015 1140   O2SAT 99.0 02/04/2015 1140   CBG (last 3)   Recent Labs  02/03/15 2006 02/04/15 0001 02/04/15 0409  GLUCAP 107* 93 99    Assessment/Plan: S/P Procedure(s) (LRB): CORONARY ARTERY BYPASS GRAFTING (CABG) X 4 UTILIZING THE LEFT INTERNAL MAMMARY ARTERY TO LAD, ENDOSCOPICALLY HARVESTED BILATERAL SAPHENEOUS VEIN GRAFTS  TO DIAGONAL, OM AND PD. (N/A) TRANSESOPHAGEAL ECHOCARDIOGRAM (TEE) (N/A) PLACEMENT OF CENTRIMAG VENTRICULAR ASSIST DEVICE (N/A) Continued diuresis today Plan to partially wean LVAD over the course of 24 hours and hopeful to remove-decannulate system tomorrow in  OR   LOS: 9 days    Breanna Alvarez 02/04/2015

## 2015-02-04 NOTE — Progress Notes (Signed)
ANTIBIOTIC / Anticoagulation  CONSULT NOTE   Pharmacy Consult for vancomycin Indication: Surgical prophylaxis for Thoratec Centrimag Blood Pump  Allergies  Allergen Reactions  . Atorvastatin Other (See Comments)    Gas, chest tightness  . Crestor [Rosuvastatin Calcium] Other (See Comments)    Muscle aches    Patient Measurements: Height: 5' (152.4 cm) Weight: 128 lb 8.5 oz (58.3 kg) IBW/kg (Calculated) : 45.5   Vital Signs: Temp: 98.8 F (37.1 C) (09/29 2145) Temp Source: Core (Comment) (09/29 2000) BP: 93/73 mmHg (09/29 1620) Pulse Rate: 91 (09/29 2145) Intake/Output from previous day: 09/28 0701 - 09/29 0700 In: 5600.3 [I.V.:2645.3; Blood:875; NG/GT:180; IV Piggyback:1900] Out: 2805 [Urine:2080; Emesis/NG output:25; Chest Tube:700] Intake/Output from this shift: Total I/O In: 348 [I.V.:318; NG/GT:30] Out: 535 [Urine:475; Emesis/NG output:20; Chest Tube:40]  Labs:  Recent Labs  02/04/15 02/04/15 0410 02/04/15 1135 02/04/15 1509 02/04/15 2120  WBC 11.8* 10.7* 13.6*  --   --   HGB 10.3* 8.9* 10.9*  11.2* 11.2* 10.5*  PLT 69* 114* 104*  --   --   CREATININE  --  1.21* 1.30* 1.30* 1.30*   Estimated Creatinine Clearance: 35.8 mL/min (by C-G formula based on Cr of 1.3). No results for input(s): VANCOTROUGH, VANCOPEAK, VANCORANDOM, GENTTROUGH, GENTPEAK, GENTRANDOM, TOBRATROUGH, TOBRAPEAK, TOBRARND, AMIKACINPEAK, AMIKACINTROU, AMIKACIN in the last 72 hours.   Microbiology: Recent Results (from the past 720 hour(s))  Surgical pcr screen     Status: None   Collection Time: 01/30/15  2:23 PM  Result Value Ref Range Status   MRSA, PCR NEGATIVE NEGATIVE Final   Staphylococcus aureus NEGATIVE NEGATIVE Final    Comment:        The Xpert SA Assay (FDA approved for NASAL specimens in patients over 37 years of age), is one component of a comprehensive surveillance program.  Test performance has been validated by [facility] for patients greater than or equal to 15  year old. It is not intended to diagnose infection nor to guide or monitor treatment.   Surgical pcr screen     Status: None   Collection Time: 01/31/15  8:08 PM  Result Value Ref Range Status   MRSA, PCR NEGATIVE NEGATIVE Final   Staphylococcus aureus NEGATIVE NEGATIVE Final    Comment:        The Xpert SA Assay (FDA approved for NASAL specimens in patients over 85 years of age), is one component of a comprehensive surveillance program.  Test performance has been validated by [facility] for patients greater than or equal to 35 year old. It is not intended to diagnose infection nor to guide or monitor treatment.   Culture, blood (single)     Status: None (Preliminary result)   Collection Time: 02/02/15  4:25 PM  Result Value Ref Range Status   Specimen Description BLOOD RIGHT HAND  Final   Special Requests IN PEDIATRIC BOTTLE 1CC  Final   Culture NO GROWTH 2 DAYS  Final   Report Status PENDING  Incomplete   Assessment: 64 yo female will be continued on vancomycin for surgical prophylaxis for Thoratec Centrimag Blood Pump.   AC: with Centrimag in place-  9/29: bleeding from ET noted this am, still with significant CT output, pltc labile 114 this am (up) now back down to 101, PVT changed heparin>>bival 0.05mg /kg/hr-. D/w RN usual range of 50-85 check APTT q6h per PVT. Pharmacy to continue to follow peripherally. 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 Post-op Zinacef. WBC 13, afeb, scr trending up 1.3 Vanc 9/26>> (dose reduced to  q24 9/28)>> 9/27 bld cx-ngtd  Plan for centrimag explant 9/30  Goal of Therapy:  Vancomycin trough level 10-15 mcg/ml  Plan:  - Continue vancomycin  q24 hours - monitor renal function and check vancomycin trough when it's appropriate if therapy to continue post explant tomorrow. -Continue  bivalrudin 0.05mg /kg/hr  Leota Sauers Pharm.D. CPP, BCPS Clinical  Pharmacist [phone] 02/04/2015 10:33 PM

## 2015-02-04 NOTE — Progress Notes (Signed)
Wasted of Morphine Infusion down the sink with Catalina Lunger, RN.  Domenica Fail, RN

## 2015-02-04 NOTE — Progress Notes (Signed)
ANTIBIOTIC CONSULT NOTE   Pharmacy Consult for vancomycin Indication: Surgical prophylaxis for Thoratec Centrimag Blood Pump  Allergies  Allergen Reactions  . Atorvastatin Other (See Comments)    Gas, chest tightness  . Crestor [Rosuvastatin Calcium] Other (See Comments)    Muscle aches    Patient Measurements: Height: 5' (152.4 cm) Weight: 128 lb 8.5 oz (58.3 kg) IBW/kg (Calculated) : 45.5   Vital Signs: Temp: 99 F (37.2 C) (09/29 1230) Temp Source: Core (Comment) (09/29 0900) BP: 84/78 mmHg (09/29 1121) Pulse Rate: 89 (09/29 1230) Intake/Output from previous day: 09/28 0701 - 09/29 0700 In: 5600.3 [I.V.:2645.3; Blood:875; NG/GT:180; IV Piggyback:1900] Out: 2805 [Urine:2080; Emesis/NG output:25; Chest Tube:700] Intake/Output from this shift: Total I/O In: 1338.3 [I.V.:494.3; Blood:334; NG/GT:260; IV Piggyback:250] Out: 1335 [Urine:965; Emesis/NG output:200; Chest Tube:170]  Labs:  Recent Labs  02/03/15 1504 02/04/15 02/04/15 0410 02/04/15 1135  WBC  --  11.8* 10.7* 13.6*  HGB 8.5* 10.3* 8.9* 10.9*  11.2*  PLT  --  69* 114* 104*  CREATININE 1.20*  --  1.21* 1.30*   Estimated Creatinine Clearance: 35.8 mL/min (by C-G formula based on Cr of 1.3). No results for input(s): VANCOTROUGH, VANCOPEAK, VANCORANDOM, GENTTROUGH, GENTPEAK, GENTRANDOM, TOBRATROUGH, TOBRAPEAK, TOBRARND, AMIKACINPEAK, AMIKACINTROU, AMIKACIN in the last 72 hours.   Microbiology: Recent Results (from the past 720 hour(s))  Surgical pcr screen     Status: None   Collection Time: 01/30/15  2:23 PM  Result Value Ref Range Status   MRSA, PCR NEGATIVE NEGATIVE Final   Staphylococcus aureus NEGATIVE NEGATIVE Final    Comment:        The Xpert SA Assay (FDA approved for NASAL specimens in patients over 33 years of age), is one component of a comprehensive surveillance program.  Test performance has been validated by [facility] for patients greater than or equal to 28 year old. It is not  intended to diagnose infection nor to guide or monitor treatment.   Surgical pcr screen     Status: None   Collection Time: 01/31/15  8:08 PM  Result Value Ref Range Status   MRSA, PCR NEGATIVE NEGATIVE Final   Staphylococcus aureus NEGATIVE NEGATIVE Final    Comment:        The Xpert SA Assay (FDA approved for NASAL specimens in patients over 35 years of age), is one component of a comprehensive surveillance program.  Test performance has been validated by [facility] for patients greater than or equal to 62 year old. It is not intended to diagnose infection nor to guide or monitor treatment.   Culture, blood (single)     Status: None (Preliminary result)   Collection Time: 02/02/15  4:25 PM  Result Value Ref Range Status   Specimen Description BLOOD RIGHT HAND  Final   Special Requests IN PEDIATRIC BOTTLE 1CC  Final   Culture NO GROWTH < 24 HOURS  Final   Report Status PENDING  Incomplete   Assessment: 64 yo female will be continued on vancomycin for surgical prophylaxis for Thoratec Centrimag Blood Pump.   AC: with Centrimag in place- using heparin titration for VAD flows >4 LPM (apparently her flows are lower than that, but PVT wants her treated to >4 #s). A printed copy of ACT titration orders was given to RN on 9/26 PM, orders entered to start 9/27 AM. Follow ACTs peripherally- goal ACT 160-180.   9/29: bleeding from ET noted this am, still with significant CT output, pltc labile 114 this am (up) now back  down to 101, PVT changed heparin>>bival 0.05mg /kg/hr-he did not leave a aptt goal range. D/w RN usual range of 50-85, first aptt ~2.5hrs after initiation was 61, recheck this afternoon then q6h per PVT. Pharmacy to continue to follow peripherally. 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 Post-op Zinacef. WBC 13, afeb, scr trending up 1.3 Vanc 9/26>> (dose reduced to  q24 9/28)>> 9/27 bld cx-ngtd  Plan for centrimag explant 9/30  Goal of  Therapy:  Vancomycin trough level 10-15 mcg/ml  Plan:  - Continue vancomycin  q24 hours - monitor renal function and check vancomycin trough when it's appropriate if therapy to continue post explant tomorrow.  Sheppard Coil PharmD., BCPS Clinical Pharmacist Pager [phone] 02/04/2015 1:07 PM

## 2015-02-04 NOTE — Progress Notes (Signed)
Spoke with Pharmacist, Homero Fellers, in reference to initial aptt result of 61.  Drip to continue at same rate.  Aptt will be rechecked at 1530 and then will be every 6 hours.

## 2015-02-04 NOTE — Progress Notes (Signed)
Advanced Heart Failure Rounding Note   Subjective:    S/p CABG with Centrimag placement 02/01/15  Remains intubated/sedated. One epi, milrinone, dopamine restarted. Good urine output but weight up over 25 pounds. VAD speed now at 3300 flow 4.3 L/min     Objective:   Weight Range:  Vital Signs:   Temp:  [98.2 F (36.8 C)-100.2 F (37.9 C)] 98.6 F (37 C) (09/29 1620) Pulse Rate:  [25-130] 90 (09/29 1620) Resp:  [6-28] 15 (09/29 1620) BP: (84-127)/(73-87) 93/73 mmHg (09/29 1620) SpO2:  [81 %-100 %] 100 % (09/29 1620) Arterial Line BP: (64-313)/(56-310) 64/58 mmHg (09/29 1600) FiO2 (%):  [40 %-60 %] 40 % (09/29 1620) Weight:  [58.3 kg (128 lb 8.5 oz)] 58.3 kg (128 lb 8.5 oz) (09/29 0500) Last BM Date: 01/31/15  Weight change: Filed Weights   02/02/15 0400 02/03/15 0426 02/04/15 0500  Weight: 55.7 kg (122 lb 12.7 oz) 54.8 kg (120 lb 13 oz) 58.3 kg (128 lb 8.5 oz)    Intake/Output:   Intake/Output Summary (Last 24 hours) at 02/04/15 1659 Last data filed at 02/04/15 1600  Gross per 24 hour  Intake 4553.13 ml  Output   3220 ml  Net 1333.13 ml     Physical Exam: General:  Intubated sedated HEENT: normal x ETT Neck: supple. RIJ swan Cor: Chest open with wound vac. RRR Lungs: clear Abdomen: soft, nontender, + distended. Hypoactive bowel sounds. Extremities: no cyanosis, clubbing, rash, 3+ edema Neuro: intubated/sedated  Telemetry: A paced 90s  Labs: Basic Metabolic Panel:  Recent Labs Lab 01/31/15 0315 02/01/15 0241  02/02/15 0402  02/02/15 1723  02/03/15 0405 02/03/15 1141 02/03/15 1504 02/04/15 0410 02/04/15 1135  NA 141 141  < > 139  < >  --   < > 136 140 140 137 137  K 3.9 4.5  < > 4.8  < >  --   < > 3.8 3.7 3.9 3.9 4.6  CL 108 112*  < > 110  < >  --   --  105 104 104 108 104  CO2 26 25  --  22  --   --   --  20*  --   --  21*  --   GLUCOSE 113* 123*  < > 119*  < >  --   < > 154* 157* 114* 109* 119*  BUN 17 17  < > 13  < >  --   --  22*  CREATININE 1.07* 0.95  < > 1.06*  < > 1.16*  --  1.35* 1.10* 1.20* 1.21* 1.30*  CALCIUM 9.0 8.8*  --  7.4*  --   --   --  8.1*  --   --  8.1*  --   MG  --   --   --  2.8*  --  2.4  --   --   --   --  1.7  --   < > = values in this interval not displayed.  Liver Function Tests:  Recent Labs Lab 01/31/15 0315 02/04/15 0410  AST 21 52*  ALT 20 24  ALKPHOS 69 51  BILITOT 0.4 1.7*  PROT 7.3 4.7*  ALBUMIN 3.2* 3.3*   No results for input(s): LIPASE, AMYLASE in the last 168 hours. No results for input(s): AMMONIA in the last 168 hours.  CBC:  Recent Labs Lab 02/03/15 0405  02/03/15 1205 02/03/15 1504 02/04/15 02/04/15 0410 02/04/15 1135  WBC 13.0*  --  9.0  --  11.8* 10.7* 13.6*  HGB 9.6*  < > 7.4* 8.5* 10.3* 8.9* 10.9*  11.2*  HCT 27.5*  < > 20.8* 25.0* 29.6* 26.7* 31.5*  33.0*  MCV 82.1  --  81.6  --  82.9 82.9 84.5  PLT 127*  --  88*  --  69* 114* 104*  < > = values in this interval not displayed.  Cardiac Enzymes: No results for input(s): CKTOTAL, CKMB, CKMBINDEX, TROPONINI in the last 168 hours.  BNP: BNP (last 3 results)  Recent Labs  07/10/14 1315  BNP 1059.1*    ProBNP (last 3 results) No results for input(s): PROBNP in the last 8760 hours.    Other results:  Imaging: Dg Chest Port 1 View  02/04/2015   CLINICAL DATA:  Left ventricular assist device  EXAM: PORTABLE CHEST 1 VIEW  COMPARISON:  02/03/2015  FINDINGS: Endotracheal tube remains in good position and unchanged. Mediastinal drain in place. Bilateral chest tubes in place. No pneumothorax. Swan-Ganz catheter right lower pulmonary artery  Left ventricular assist device unchanged in position  Mild bibasilar atelectasis. Negative for heart failure or pneumonia.  IMPRESSION: Support lines remain in good position.  Mild bibasilar atelectasis unchanged.  No acute findings.   Electronically Signed   By: Marlan Palau M.D.   On: 02/04/2015 07:24   Dg Chest Port 1 View  02/03/2015   CLINICAL DATA:   Status post LVAD.  Decreased oxygen saturation.  EXAM: PORTABLE CHEST 1 VIEW  COMPARISON:  Portable film earlier in the day.  FINDINGS: Mild vascular congestion and slight subsegmental atelectasis without significant volume loss, consolidation, or edema. LVAD device unchanged. BILATERAL chest tubes and mediastinal tube stable. Endotracheal tube stable. Swan-Ganz catheter tip RIGHT lower lobe pulmonary artery. Nasogastric tube in the stomach.  IMPRESSION: Stable chest.  No evidence for significant consolidation or edema.   Electronically Signed   By: Elsie Stain M.D.   On: 02/03/2015 12:00   Dg Chest Port 1 View  02/03/2015   CLINICAL DATA:  Ventilator.  Postop.  EXAM: PORTABLE CHEST 1 VIEW  COMPARISON:  02/02/2015  FINDINGS: Slight advancement of the Swan-Ganz catheter with the tip in the inferior right pulmonary artery. Bilateral chest tubes without pneumothorax. Endotracheal tube and NG tube are unchanged. Cardiomegaly. Low lung volumes with left base atelectasis. Improving aeration since prior study with decreasing bibasilar atelectasis.  IMPRESSION: Low volumes with left base atelectasis. Improving aeration in the bases since prior study   Electronically Signed   By: Charlett Nose M.D.   On: 02/03/2015 07:36     Medications:     Scheduled Medications: . acetaminophen  1,000 mg Oral 4 times per day   Or  . acetaminophen (TYLENOL) oral liquid 160 mg/5 mL  1,000 mg Per Tube 4 times per day  . antiseptic oral rinse  7 mL Mouth Rinse q12n4p  . aspirin EC  325 mg Oral Daily   Or  . aspirin  324 mg Per Tube Daily  . bisacodyl  10 mg Oral Daily   Or  . bisacodyl  10 mg Rectal Daily  . chlorhexidine  15 mL Mouth Rinse BID  . docusate sodium  200 mg Oral Daily  . fluticasone  1 spray Each Nare Daily  . furosemide  40 mg Intravenous Q12H  . insulin aspart  0-24 Units Subcutaneous 6 times per day  . insulin regular  0-10 Units Intravenous TID WC  . metoCLOPramide (REGLAN) injection  10 mg  Intravenous  4 times per day  . metoprolol tartrate  12.5 mg Oral BID   Or  . metoprolol tartrate  12.5 mg Per Tube BID  . mupirocin ointment  1 application Topical BID  . pantoprazole sodium  40 mg Per Tube Daily  . potassium chloride  10 mEq Intravenous Q1 Hr x 2  . pravastatin  20 mg Oral q1800  . sodium chloride  10-40 mL Intracatheter Q12H  . sodium chloride  3 mL Intravenous Q12H  . vancomycin  500 mg Intravenous Q24H    Infusions: . sodium chloride 20 mL/hr at 02/04/15 1600  . sodium chloride    . sodium chloride 20 mL/hr at 02/04/15 1600  . bivalirudin (ANGIOMAX) infusion 0.5 mg/mL (Non-ACS indications) 0.05 mg/kg/hr (02/04/15 1600)  . DOPamine 2.5 mcg/kg/min (02/04/15 1600)  . epinephrine 2.5 mcg/min (02/04/15 1600)  . insulin (NOVOLIN-R) infusion Stopped (02/02/15 0800)  . lactated ringers Stopped (02/02/15 2000)  . lactated ringers 20 mL/hr at 02/04/15 1600  . milrinone 0.3 mcg/kg/min (02/04/15 1600)  . morphine 2 mg/hr (02/04/15 1600)  . nitroGLYCERIN Stopped (02/03/15 1340)  . nitroPRUSSide 0 mcg/kg/min (02/04/15 0940)  . norepinephrine (LEVOPHED) Adult infusion 0 mcg/min (02/04/15 1530)  . propofol (DIPRIVAN) infusion 50 mcg/kg/min (02/04/15 1600)    PRN Medications: sodium chloride, albumin human, HYDROmorphone (DILAUDID) injection, metoprolol, midazolam, midazolam, ondansetron (ZOFRAN) IV, oxyCODONE, sodium chloride, sodium chloride, traMADol   Assessment:   1. Cardiogenic shock   --s/p CABG with centrimag placement 02/01/15 2. CAD s/p CABG 3. 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 Acute respiratory failure 5. Acute blood loss anemia   Plan/Discussion:    Hemodynamics labile today. She remains volume overloaded.Renal function stable. Would continue gentle diuresis and tolerate CVP down to 10-12.  Continue milrinone and epi.  VAD parameters reviewed personally.   The patient is critically ill with multiple organ systems failure and requires high complexity decision  making for assessment and support, frequent evaluation and titration of therapies, application of advanced monitoring technologies and extensive interpretation of multiple databases.   Critical Care Time devoted to patient care services described in this note is 35 Minutes.  Length of Stay: 9   Arvilla Meres MD 02/04/2015, 4:59 PM  Advanced Heart Failure Team Pager [phone] (M-F; 7a - 4p)  Please contact [facility] for night-coverage after hours (4p -7a ) and weekends on [email or web address]

## 2015-02-05 ENCOUNTER — Inpatient Hospital Stay (HOSPITAL_COMMUNITY): Payer: Medicaid Other

## 2015-02-05 ENCOUNTER — Inpatient Hospital Stay (HOSPITAL_COMMUNITY): Payer: Medicaid Other | Admitting: Certified Registered Nurse Anesthetist

## 2015-02-05 ENCOUNTER — Encounter (HOSPITAL_COMMUNITY): Payer: Self-pay | Admitting: Certified Registered Nurse Anesthetist

## 2015-02-05 ENCOUNTER — Encounter (HOSPITAL_COMMUNITY)
Admission: AD | Disposition: A | Discharge status: Skilled Nursing Facility | Payer: Medicaid Other | Source: Ambulatory Visit | Attending: Cardiothoracic Surgery

## 2015-02-05 DIAGNOSIS — I255 Ischemic cardiomyopathy: Secondary | ICD-10-CM

## 2015-02-05 DIAGNOSIS — I5023 Acute on chronic systolic (congestive) heart failure: Secondary | ICD-10-CM

## 2015-02-05 DIAGNOSIS — I2510 Atherosclerotic heart disease of native coronary artery without angina pectoris: Secondary | ICD-10-CM | POA: Diagnosis present

## 2015-02-05 DIAGNOSIS — I5022 Chronic systolic (congestive) heart failure: Secondary | ICD-10-CM

## 2015-02-05 HISTORY — PX: TEE WITHOUT CARDIOVERSION: SHX5443

## 2015-02-05 HISTORY — PX: CANNULATION FOR CARDIOPULMONARY BYPASS: SHX6411

## 2015-02-05 HISTORY — PX: REMOVAL OF CENTRIMAG VENTRICULAR ASSIST DEVICE: SHX6220

## 2015-02-05 LAB — POCT I-STAT 3, ART BLOOD GAS (G3+)
ACID-BASE DEFICIT: 2 mmol/L (ref 0.0–2.0)
Acid-Base Excess: 1 mmol/L (ref 0.0–2.0)
Acid-base deficit: 1 mmol/L (ref 0.0–2.0)
Acid-base deficit: 3 mmol/L — ABNORMAL HIGH (ref 0.0–2.0)
Acid-base deficit: 1 mmol/L (ref 0.0–2.0)
Acid-base deficit: 4 mmol/L — ABNORMAL HIGH (ref 0.0–2.0)
BICARBONATE: 22 meq/L (ref 20.0–24.0)
BICARBONATE: 23.1 meq/L (ref 20.0–24.0)
BICARBONATE: 24.2 meq/L — AB (ref 20.0–24.0)
Bicarbonate: 22.7 mEq/L (ref 20.0–24.0)
Bicarbonate: 25.6 mEq/L — ABNORMAL HIGH (ref 20.0–24.0)
Bicarbonate: 20.4 mEq/L (ref 20.0–24.0)
Bicarbonate: 21.7 mEq/L (ref 20.0–24.0)
O2 SAT: 100 %
O2 SAT: 100 %
O2 SAT: 99 %
O2 SAT: 99 %
O2 SAT: 99 %
O2 Saturation: 98 %
O2 Saturation: 100 %
PCO2 ART: 27.1 mmHg — AB (ref 35.0–45.0)
PCO2 ART: 31.7 mmHg — AB (ref 35.0–45.0)
PCO2 ART: 37.7 mmHg (ref 35.0–45.0)
PCO2 ART: 39.1 mmHg (ref 35.0–45.0)
PCO2 ART: 41.5 mmHg (ref 35.0–45.0)
PH ART: 7.425 (ref 7.350–7.450)
PH ART: 7.418 (ref 7.350–7.450)
PH ART: 7.516 — AB (ref 7.350–7.450)
PH ART: 7.457 — AB (ref 7.350–7.450)
PO2 ART: 139 mmHg — AB (ref 80.0–100.0)
PO2 ART: 140 mmHg — AB (ref 80.0–100.0)
PO2 ART: 359 mmHg — AB (ref 80.0–100.0)
PO2 ART: 473 mmHg — AB (ref 80.0–100.0)
Patient temperature: 37.4
TCO2: 21 mmol/L (ref 0–100)
TCO2: 23 mmol/L (ref 0–100)
TCO2: 23 mmol/L (ref 0–100)
TCO2: 24 mmol/L (ref 0–100)
TCO2: 24 mmol/L (ref 0–100)
TCO2: 25 mmol/L (ref 0–100)
TCO2: 27 mmol/L (ref 0–100)
pCO2 arterial: 38.2 mmHg (ref 35.0–45.0)
pCO2 arterial: 30.7 mmHg — ABNORMAL LOW (ref 35.0–45.0)
pH, Arterial: 7.397 (ref 7.350–7.450)
pH, Arterial: 7.409 (ref 7.350–7.450)
pH, Arterial: 7.348 — ABNORMAL LOW (ref 7.350–7.450)
pO2, Arterial: 111 mmHg — ABNORMAL HIGH (ref 80.0–100.0)
pO2, Arterial: 387 mmHg — ABNORMAL HIGH (ref 80.0–100.0)
pO2, Arterial: 147 mmHg — ABNORMAL HIGH (ref 80.0–100.0)

## 2015-02-05 LAB — CBC
HCT: 27.6 % — ABNORMAL LOW (ref 36.0–46.0)
HCT: 28 % — ABNORMAL LOW (ref 36.0–46.0)
HCT: 28.8 % — ABNORMAL LOW (ref 36.0–46.0)
Hemoglobin: 9.6 g/dL — ABNORMAL LOW (ref 12.0–15.0)
Hemoglobin: 9.4 g/dL — ABNORMAL LOW (ref 12.0–15.0)
Hemoglobin: 9.9 g/dL — ABNORMAL LOW (ref 12.0–15.0)
MCH: 29.5 pg (ref 26.0–34.0)
MCH: 28.6 pg (ref 26.0–34.0)
MCH: 29.5 pg (ref 26.0–34.0)
MCHC: 34.8 g/dL (ref 30.0–36.0)
MCHC: 33.6 g/dL (ref 30.0–36.0)
MCHC: 34.4 g/dL (ref 30.0–36.0)
MCV: 84.9 fL (ref 78.0–100.0)
MCV: 85.1 fL (ref 78.0–100.0)
MCV: 85.7 fL (ref 78.0–100.0)
Platelets: 85 10*3/uL — ABNORMAL LOW (ref 150–400)
Platelets: 77 10*3/uL — ABNORMAL LOW (ref 150–400)
Platelets: 54 10*3/uL — ABNORMAL LOW (ref 150–400)
RBC: 3.25 MIL/uL — ABNORMAL LOW (ref 3.87–5.11)
RBC: 3.29 MIL/uL — ABNORMAL LOW (ref 3.87–5.11)
RBC: 3.36 MIL/uL — ABNORMAL LOW (ref 3.87–5.11)
RDW: 16.5 % — ABNORMAL HIGH (ref 11.5–15.5)
RDW: 16.7 % — ABNORMAL HIGH (ref 11.5–15.5)
RDW: 15.9 % — ABNORMAL HIGH (ref 11.5–15.5)
WBC: 12.4 10*3/uL — ABNORMAL HIGH (ref 4.0–10.5)
WBC: 10.9 10*3/uL — ABNORMAL HIGH (ref 4.0–10.5)
WBC: 12 10*3/uL — ABNORMAL HIGH (ref 4.0–10.5)

## 2015-02-05 LAB — HEMOGLOBIN AND HEMATOCRIT, BLOOD
HCT: 23.7 % — ABNORMAL LOW (ref 36.0–46.0)
Hemoglobin: 8.3 g/dL — ABNORMAL LOW (ref 12.0–15.0)

## 2015-02-05 LAB — POCT I-STAT, CHEM 8
BUN: 26 mg/dL — ABNORMAL HIGH (ref 6–20)
BUN: 23 mg/dL — AB (ref 6–20)
BUN: 21 mg/dL — ABNORMAL HIGH (ref 6–20)
BUN: 25 mg/dL — ABNORMAL HIGH (ref 6–20)
BUN: 23 mg/dL — AB (ref 6–20)
CALCIUM ION: 1.09 mmol/L — AB (ref 1.13–1.30)
CALCIUM ION: 0.87 mmol/L — AB (ref 1.13–1.30)
CALCIUM ION: 1.07 mmol/L — AB (ref 1.13–1.30)
CHLORIDE: 102 mmol/L (ref 101–111)
CHLORIDE: 112 mmol/L — AB (ref 101–111)
CHLORIDE: 104 mmol/L (ref 101–111)
CHLORIDE: 104 mmol/L (ref 101–111)
Calcium, Ion: 1.15 mmol/L (ref 1.13–1.30)
Calcium, Ion: 1.28 mmol/L (ref 1.13–1.30)
Chloride: 105 mmol/L (ref 101–111)
Creatinine, Ser: 1.3 mg/dL — ABNORMAL HIGH (ref 0.44–1.00)
Creatinine, Ser: 1.2 mg/dL — ABNORMAL HIGH (ref 0.44–1.00)
Creatinine, Ser: 1 mg/dL (ref 0.44–1.00)
Creatinine, Ser: 1.2 mg/dL — ABNORMAL HIGH (ref 0.44–1.00)
Creatinine, Ser: 1.1 mg/dL — ABNORMAL HIGH (ref 0.44–1.00)
GLUCOSE: 102 mg/dL — AB (ref 65–99)
GLUCOSE: 102 mg/dL — AB (ref 65–99)
GLUCOSE: 112 mg/dL — AB (ref 65–99)
Glucose, Bld: 132 mg/dL — ABNORMAL HIGH (ref 65–99)
Glucose, Bld: 114 mg/dL — ABNORMAL HIGH (ref 65–99)
HCT: 29 % — ABNORMAL LOW (ref 36.0–46.0)
HCT: 24 % — ABNORMAL LOW (ref 36.0–46.0)
HCT: 24 % — ABNORMAL LOW (ref 36.0–46.0)
HEMATOCRIT: 24 % — AB (ref 36.0–46.0)
HEMATOCRIT: 24 % — AB (ref 36.0–46.0)
HEMOGLOBIN: 8.2 g/dL — AB (ref 12.0–15.0)
HEMOGLOBIN: 8.2 g/dL — AB (ref 12.0–15.0)
Hemoglobin: 9.9 g/dL — ABNORMAL LOW (ref 12.0–15.0)
Hemoglobin: 8.2 g/dL — ABNORMAL LOW (ref 12.0–15.0)
Hemoglobin: 8.2 g/dL — ABNORMAL LOW (ref 12.0–15.0)
POTASSIUM: 4.3 mmol/L (ref 3.5–5.1)
POTASSIUM: 3.5 mmol/L (ref 3.5–5.1)
Potassium: 3.9 mmol/L (ref 3.5–5.1)
Potassium: 3.7 mmol/L (ref 3.5–5.1)
Potassium: 4.5 mmol/L (ref 3.5–5.1)
SODIUM: 136 mmol/L (ref 135–145)
SODIUM: 130 mmol/L — AB (ref 135–145)
SODIUM: 134 mmol/L — AB (ref 135–145)
SODIUM: 134 mmol/L — AB (ref 135–145)
SODIUM: 136 mmol/L (ref 135–145)
TCO2: 21 mmol/L (ref 0–100)
TCO2: 27 mmol/L (ref 0–100)
TCO2: 20 mmol/L (ref 0–100)
TCO2: 25 mmol/L (ref 0–100)
TCO2: 22 mmol/L (ref 0–100)

## 2015-02-05 LAB — PLATELET COUNT: Platelets: 40 10*3/uL — ABNORMAL LOW (ref 150–400)

## 2015-02-05 LAB — SEROTONIN RELEASE ASSAY (SRA)
SRA .2 IU/mL UFH Ser-aCnc: 1 % (ref 0–20)
SRA 100IU/mL UFH Ser-aCnc: 1 % (ref 0–20)

## 2015-02-05 LAB — APTT
aPTT: 71 seconds — ABNORMAL HIGH (ref 24–37)
aPTT: 68 seconds — ABNORMAL HIGH (ref 24–37)
aPTT: 72 seconds — ABNORMAL HIGH (ref 24–37)
aPTT: 48 seconds — ABNORMAL HIGH (ref 24–37)

## 2015-02-05 LAB — COMPREHENSIVE METABOLIC PANEL
ALT: 23 U/L (ref 14–54)
AST: 57 U/L — ABNORMAL HIGH (ref 15–41)
Albumin: 3.6 g/dL (ref 3.5–5.0)
Alkaline Phosphatase: 55 U/L (ref 38–126)
Anion gap: 8 (ref 5–15)
BUN: 22 mg/dL — ABNORMAL HIGH (ref 6–20)
CO2: 22 mmol/L (ref 22–32)
Calcium: 8.3 mg/dL — ABNORMAL LOW (ref 8.9–10.3)
Chloride: 105 mmol/L (ref 101–111)
Creatinine, Ser: 1.26 mg/dL — ABNORMAL HIGH (ref 0.44–1.00)
GFR calc Af Amer: 52 mL/min — ABNORMAL LOW (ref >60)
GFR calc non Af Amer: 45 mL/min — ABNORMAL LOW (ref >60)
Glucose, Bld: 114 mg/dL — ABNORMAL HIGH (ref 65–99)
Potassium: 3.8 mmol/L (ref 3.5–5.1)
Sodium: 135 mmol/L (ref 135–145)
Total Bilirubin: 1.6 mg/dL — ABNORMAL HIGH (ref 0.3–1.2)
Total Protein: 5.2 g/dL — ABNORMAL LOW (ref 6.5–8.1)

## 2015-02-05 LAB — POCT I-STAT 4, (NA,K, GLUC, HGB,HCT)
Glucose, Bld: 119 mg/dL — ABNORMAL HIGH (ref 65–99)
HCT: 28 % — ABNORMAL LOW (ref 36.0–46.0)
HEMOGLOBIN: 9.5 g/dL — AB (ref 12.0–15.0)
POTASSIUM: 3.3 mmol/L — AB (ref 3.5–5.1)
Sodium: 135 mmol/L (ref 135–145)

## 2015-02-05 LAB — PROTIME-INR
INR: 1.6 — ABNORMAL HIGH (ref 0.00–1.49)
INR: 1.51 — ABNORMAL HIGH (ref 0.00–1.49)
Prothrombin Time: 19.1 seconds — ABNORMAL HIGH (ref 11.6–15.2)
Prothrombin Time: 18.2 seconds — ABNORMAL HIGH (ref 11.6–15.2)

## 2015-02-05 LAB — CARBOXYHEMOGLOBIN
Carboxyhemoglobin: 2 % — ABNORMAL HIGH (ref 0.5–1.5)
Methemoglobin: 1.8 % — ABNORMAL HIGH (ref 0.0–1.5)
O2 Saturation: 78.7 %
Total hemoglobin: 8 g/dL — ABNORMAL LOW (ref 12.0–16.0)

## 2015-02-05 LAB — GLUCOSE, CAPILLARY
GLUCOSE-CAPILLARY: 104 mg/dL — AB (ref 65–99)
GLUCOSE-CAPILLARY: 88 mg/dL (ref 65–99)
Glucose-Capillary: 109 mg/dL — ABNORMAL HIGH (ref 65–99)
Glucose-Capillary: 111 mg/dL — ABNORMAL HIGH (ref 65–99)
Glucose-Capillary: 59 mg/dL — ABNORMAL LOW (ref 65–99)
Glucose-Capillary: 99 mg/dL (ref 65–99)

## 2015-02-05 LAB — PREPARE PLATELET PHERESIS: UNIT DIVISION: 0

## 2015-02-05 LAB — MAGNESIUM: Magnesium: 2 mg/dL (ref 1.7–2.4)

## 2015-02-05 LAB — BLOOD PRODUCT ORDER (VERBAL) VERIFICATION

## 2015-02-05 LAB — PREPARE RBC (CROSSMATCH)

## 2015-02-05 LAB — POCT ACTIVATED CLOTTING TIME: ACTIVATED CLOTTING TIME: 183 s

## 2015-02-05 LAB — CALCIUM, IONIZED: Calcium, Ionized, Serum: 4.4 mg/dL — ABNORMAL LOW (ref 4.5–5.6)

## 2015-02-05 SURGERY — REMOVAL, VENTRICULAR ASSIST DEVICE, EXTRACORPOREAL
Anesthesia: General | Site: Mouth

## 2015-02-05 MED ORDER — LACTATED RINGERS IV SOLN
INTRAVENOUS | Status: DC
  Administered 2015-02-05: 20 mL/h via INTRAVENOUS

## 2015-02-05 MED ORDER — MAGNESIUM SULFATE 4 GM/100ML IV SOLN
4.0000 g | Freq: Once | INTRAVENOUS | Status: AC
  Administered 2015-02-05: 4 g via INTRAVENOUS
  Filled 2015-02-05: qty 100

## 2015-02-05 MED ORDER — EPINEPHRINE HCL 0.1 MG/ML IJ SOSY
PREFILLED_SYRINGE | INTRAMUSCULAR | Status: DC | PRN
  Administered 2015-02-05: 0.2 mg via INTRAVENOUS

## 2015-02-05 MED ORDER — 0.9 % SODIUM CHLORIDE (POUR BTL) OPTIME
TOPICAL | Status: DC | PRN
  Administered 2015-02-05: 3000 mL

## 2015-02-05 MED ORDER — MIDAZOLAM HCL 2 MG/2ML IJ SOLN
INTRAMUSCULAR | Status: AC
  Filled 2015-02-05: qty 4

## 2015-02-05 MED ORDER — HEPARIN SODIUM (PORCINE) 1000 UNIT/ML IJ SOLN
INTRAMUSCULAR | Status: DC | PRN
  Administered 2015-02-05: 7000 [IU] via INTRAVENOUS

## 2015-02-05 MED ORDER — SODIUM CHLORIDE 0.9 % IV SOLN: Freq: Once | INTRAVENOUS | Status: DC

## 2015-02-05 MED ORDER — DESMOPRESSIN ACETATE 4 MCG/ML IJ SOLN
20.0000 ug | INTRAMUSCULAR | Status: AC
  Administered 2015-02-05: 20 ug via INTRAVENOUS
  Filled 2015-02-05: qty 5

## 2015-02-05 MED ORDER — DEXTROSE 5 % IV SOLN
1.5000 g | Freq: Three times a day (TID) | INTRAVENOUS | Status: AC
  Administered 2015-02-05: 1.5 g via INTRAVENOUS
  Filled 2015-02-05: qty 1.5

## 2015-02-05 MED ORDER — SODIUM CHLORIDE 0.9 % IJ SOLN
3.0000 mL | Freq: Two times a day (BID) | INTRAMUSCULAR | Status: DC
  Administered 2015-02-06 – 2015-03-01 (×33): 3 mL via INTRAVENOUS

## 2015-02-05 MED ORDER — VANCOMYCIN HCL 1000 MG IV SOLR
INTRAVENOUS | Status: DC | PRN
  Administered 2015-02-05: 19:00:00

## 2015-02-05 MED ORDER — INSULIN REGULAR BOLUS VIA INFUSION
0.0000 [IU] | Freq: Three times a day (TID) | INTRAVENOUS | Status: DC
  Filled 2015-02-05: qty 10

## 2015-02-05 MED ORDER — LACTATED RINGERS IV SOLN: 500.0000 mL | Freq: Once | INTRAVENOUS | Status: DC | PRN

## 2015-02-05 MED ORDER — VITAL 1.5 CAL PO LIQD
1000.0000 mL | ORAL | Status: DC
  Filled 2015-02-05: qty 1000

## 2015-02-05 MED ORDER — DEXMEDETOMIDINE HCL IN NACL 200 MCG/50ML IV SOLN: 0.0000 ug/kg/h | INTRAVENOUS | Status: DC

## 2015-02-05 MED ORDER — HEMOSTATIC AGENTS (NO CHARGE) OPTIME
TOPICAL | Status: DC | PRN
  Administered 2015-02-05: 1 via TOPICAL

## 2015-02-05 MED ORDER — VANCOMYCIN HCL 1000 MG IV SOLR
INTRAVENOUS | Status: AC
  Filled 2015-02-05: qty 1000

## 2015-02-05 MED ORDER — ALBUMIN HUMAN 5 % IV SOLN
INTRAVENOUS | Status: DC | PRN
  Administered 2015-02-05: 19:00:00 via INTRAVENOUS

## 2015-02-05 MED ORDER — SODIUM CHLORIDE 0.9 % IJ SOLN
INTRAMUSCULAR | Status: AC
  Filled 2015-02-05: qty 10

## 2015-02-05 MED ORDER — ROCURONIUM BROMIDE 100 MG/10ML IV SOLN
INTRAVENOUS | Status: DC | PRN
  Administered 2015-02-05: 50 mg via INTRAVENOUS

## 2015-02-05 MED ORDER — POTASSIUM CHLORIDE 10 MEQ/50ML IV SOLN
10.0000 meq | INTRAVENOUS | Status: AC
  Administered 2015-02-05 – 2015-02-06 (×3): 10 meq via INTRAVENOUS

## 2015-02-05 MED ORDER — SODIUM CHLORIDE 0.45 % IV SOLN
INTRAVENOUS | Status: DC | PRN
  Administered 2015-02-06 – 2015-02-08 (×3): 20 mL/h via INTRAVENOUS
  Administered 2015-02-10: 04:00:00 via INTRAVENOUS

## 2015-02-05 MED ORDER — SODIUM CHLORIDE 0.9 % IV SOLN: 250.0000 mL | INTRAVENOUS | Status: DC

## 2015-02-05 MED ORDER — FAMOTIDINE IN NACL 20-0.9 MG/50ML-% IV SOLN
20.0000 mg | Freq: Two times a day (BID) | INTRAVENOUS | Status: AC
  Administered 2015-02-05 – 2015-02-06 (×2): 20 mg via INTRAVENOUS
  Filled 2015-02-05: qty 50

## 2015-02-05 MED ORDER — PROTAMINE SULFATE 10 MG/ML IV SOLN
INTRAVENOUS | Status: DC | PRN
  Administered 2015-02-05: 30 mg via INTRAVENOUS
  Administered 2015-02-05: 70 mg via INTRAVENOUS

## 2015-02-05 MED ORDER — SODIUM CHLORIDE 0.9 % IJ SOLN
OROMUCOSAL | Status: DC | PRN
  Administered 2015-02-05 (×2): via TOPICAL

## 2015-02-05 MED ORDER — POTASSIUM CHLORIDE 10 MEQ/50ML IV SOLN
10.0000 meq | INTRAVENOUS | Status: AC
  Administered 2015-02-05 (×3): 10 meq via INTRAVENOUS
  Filled 2015-02-05 (×3): qty 50

## 2015-02-05 MED ORDER — VECURONIUM BROMIDE 10 MG IV SOLR
INTRAVENOUS | Status: AC
  Filled 2015-02-05: qty 10

## 2015-02-05 MED ORDER — SODIUM CHLORIDE 0.9 % IJ SOLN
3.0000 mL | INTRAMUSCULAR | Status: DC | PRN
  Administered 2015-02-11 – 2015-02-22 (×3): 3 mL via INTRAVENOUS
  Filled 2015-02-05 (×3): qty 3

## 2015-02-05 MED ORDER — VECURONIUM BROMIDE 10 MG IV SOLR
INTRAVENOUS | Status: DC | PRN
  Administered 2015-02-05 (×2): 5 mg via INTRAVENOUS

## 2015-02-05 MED ORDER — CEFAZOLIN SODIUM-DEXTROSE 2-3 GM-% IV SOLR
INTRAVENOUS | Status: DC | PRN
  Administered 2015-02-05: 2 g via INTRAVENOUS

## 2015-02-05 MED ORDER — DEXTROSE 5 % IV SOLN
1.5000 g | Freq: Two times a day (BID) | INTRAVENOUS | Status: AC
  Administered 2015-02-06 – 2015-02-07 (×4): 1.5 g via INTRAVENOUS
  Filled 2015-02-05 (×4): qty 1.5

## 2015-02-05 MED ORDER — FUROSEMIDE 10 MG/ML IJ SOLN
INTRAMUSCULAR | Status: DC | PRN
  Administered 2015-02-05: 20 mg via INTRAMUSCULAR

## 2015-02-05 MED ORDER — SODIUM CHLORIDE 0.9 % IV SOLN
INTRAVENOUS | Status: DC
  Administered 2015-02-06: 20 mL/h via INTRAVENOUS

## 2015-02-05 MED ORDER — FUROSEMIDE 10 MG/ML IJ SOLN
INTRAMUSCULAR | Status: AC
  Filled 2015-02-05: qty 2

## 2015-02-05 SURGICAL SUPPLY — 112 items
ADH SRG 12 PREFL SYR 3 SPRDR (MISCELLANEOUS) ×2
AGENT HMST MTR 8 SURGIFLO (HEMOSTASIS) ×2
ATTRACTOMAT 16X20 MAGNETIC DRP (DRAPES) ×2 IMPLANT
BAG DECANTER FOR FLEXI CONT (MISCELLANEOUS) ×2 IMPLANT
BLADE SURG 12 STRL SS (BLADE) ×4 IMPLANT
CANISTER SUCTION 2500CC (MISCELLANEOUS) ×4 IMPLANT
CANN PRFSN 3/8XRT ANG TPR 14 (MISCELLANEOUS) ×2
CANNULA AORTIC HI-FLOW 6.5M20F (CANNULA) ×2 IMPLANT
CANNULA ARTERIAL NVNT 3/8 20FR (MISCELLANEOUS) ×2 IMPLANT
CANNULA FEM VENOUS REMOTE 22FR (CANNULA) ×2 IMPLANT
CANNULA GUNDRY RCSP 15FR (MISCELLANEOUS) ×2 IMPLANT
CANNULA MALLEABLE SINGLE 40FR (CANNULA) ×2 IMPLANT
CANNULA PRFSN 3/8XRT ANG TPR14 (MISCELLANEOUS) IMPLANT
CANNULA VEN MTL TIP RT (MISCELLANEOUS) ×4
CANNULA VENOUS MAL SGL STG 40 (MISCELLANEOUS) IMPLANT
CANNULAE VENOUS MAL SGL STG 40 (MISCELLANEOUS)
CATH CPB KIT VANTRIGT (MISCELLANEOUS) ×2 IMPLANT
CATH ROBINSON RED A/P 18FR (CATHETERS) ×12 IMPLANT
CATH THORACIC 28FR (CATHETERS) ×4 IMPLANT
CATH THORACIC 28FR RT ANG (CATHETERS) IMPLANT
CATH THORACIC 36FR (CATHETERS) IMPLANT
CATH THORACIC 36FR RT ANG (CATHETERS) ×6 IMPLANT
CLIP TI WIDE RED SMALL 24 (CLIP) IMPLANT
CONN 3/8X1/2 ST GISH (MISCELLANEOUS) ×2 IMPLANT
CONN 3/8X3/8 GISH STERILE (MISCELLANEOUS) ×2 IMPLANT
COVER SURGICAL LIGHT HANDLE (MISCELLANEOUS) ×2 IMPLANT
CRADLE DONUT ADULT HEAD (MISCELLANEOUS) ×4 IMPLANT
DRAIN CHANNEL 32F RND 10.7 FF (WOUND CARE) ×4 IMPLANT
DRAPE CARDIOVASCULAR INCISE (DRAPES) ×4
DRAPE CHEST BREAST 15X10 FENES (DRAPES) ×2 IMPLANT
DRAPE LAPAROSCOPIC ABDOMINAL (DRAPES) ×2 IMPLANT
DRAPE SLUSH/WARMER DISC (DRAPES) ×2 IMPLANT
DRAPE SRG 135X102X78XABS (DRAPES) ×2 IMPLANT
DRSG AQUACEL AG ADV 3.5X14 (GAUZE/BANDAGES/DRESSINGS) ×4 IMPLANT
ELECT BLADE 4.0 EZ CLEAN MEGAD (MISCELLANEOUS) ×4
ELECT BLADE 6.5 EXT (BLADE) ×4 IMPLANT
ELECT CAUTERY BLADE 6.4 (BLADE) ×4 IMPLANT
ELECT REM PT RETURN 9FT ADLT (ELECTROSURGICAL) ×4
ELECTRODE BLDE 4.0 EZ CLN MEGD (MISCELLANEOUS) ×2 IMPLANT
ELECTRODE REM PT RTRN 9FT ADLT (ELECTROSURGICAL) ×4 IMPLANT
FEMORAL VENOUS CANN RAP (CANNULA) ×2 IMPLANT
GAUZE SPONGE 4X4 12PLY STRL (GAUZE/BANDAGES/DRESSINGS) ×8 IMPLANT
GAUZE SPONGE 4X4 16PLY XRAY LF (GAUZE/BANDAGES/DRESSINGS) ×2 IMPLANT
GAUZE XEROFORM 1X8 LF (GAUZE/BANDAGES/DRESSINGS) ×2 IMPLANT
GLOVE BIO SURGEON STRL SZ7.5 (GLOVE) ×8 IMPLANT
GOWN STRL REUS W/ TWL LRG LVL3 (GOWN DISPOSABLE) ×8 IMPLANT
GOWN STRL REUS W/TWL LRG LVL3 (GOWN DISPOSABLE) ×20
HEMOSTAT POWDER KIT SURGIFOAM (HEMOSTASIS) ×4 IMPLANT
HEMOSTAT POWDER SURGIFOAM 1G (HEMOSTASIS) ×6 IMPLANT
HEMOSTAT SURGICEL 2X14 (HEMOSTASIS) ×4 IMPLANT
INSERT FOGARTY XLG (MISCELLANEOUS) IMPLANT
KIT BASIN OR (CUSTOM PROCEDURE TRAY) ×4 IMPLANT
KIT DILATOR VASC 18G NDL (KITS) ×2 IMPLANT
KIT ROOM TURNOVER OR (KITS) ×4 IMPLANT
KIT SUCTION CATH 14FR (SUCTIONS) ×4 IMPLANT
LEAD PACING MYOCARDI (MISCELLANEOUS) ×2 IMPLANT
NS IRRIG 1000ML POUR BTL (IV SOLUTION) ×16 IMPLANT
PACK CHEST (CUSTOM PROCEDURE TRAY) ×2 IMPLANT
PACK OPEN HEART (CUSTOM PROCEDURE TRAY) ×4 IMPLANT
PAD ARMBOARD 7.5X6 YLW CONV (MISCELLANEOUS) ×8 IMPLANT
PENCIL BUTTON HOLSTER BLD 10FT (ELECTRODE) ×4 IMPLANT
SET CARDIOPLEGIA MPS 5001102 (MISCELLANEOUS) ×2 IMPLANT
SPOGE SURGIFLO 8M (HEMOSTASIS) ×2
SPONGE SURGIFLO 8M (HEMOSTASIS) IMPLANT
STAPLER VISISTAT 35W (STAPLE) ×2 IMPLANT
SUT BONE WAX W31G (SUTURE) ×4 IMPLANT
SUT ETHIBOND 2 0 SH (SUTURE) ×16
SUT ETHIBOND 2 0 SH 36X2 (SUTURE) IMPLANT
SUT ETHIBOND NAB MH 2-0 36IN (SUTURE) ×8 IMPLANT
SUT ETHILON 3 0 FSL (SUTURE) ×4 IMPLANT
SUT MNCRL AB 4-0 PS2 18 (SUTURE) IMPLANT
SUT PROLENE 3 0 SH DA (SUTURE) ×8 IMPLANT
SUT PROLENE 3 0 SH1 36 (SUTURE) IMPLANT
SUT PROLENE 4 0 RB 1 (SUTURE) ×4
SUT PROLENE 4 0 SH DA (SUTURE) ×10 IMPLANT
SUT PROLENE 4-0 RB1 .5 CRCL 36 (SUTURE) ×2 IMPLANT
SUT PROLENE 5 0 C 1 36 (SUTURE) ×4 IMPLANT
SUT PROLENE 6 0 C 1 30 (SUTURE) ×4 IMPLANT
SUT PROLENE 7 0 DA (SUTURE) IMPLANT
SUT PROLENE 8 0 BV175 6 (SUTURE) IMPLANT
SUT PROLENE BLUE 7 0 (SUTURE) ×4 IMPLANT
SUT SILK  1 MH (SUTURE) ×8
SUT SILK 1 MH (SUTURE) IMPLANT
SUT SILK 2 0 SH CR/8 (SUTURE) ×4 IMPLANT
SUT SILK 3 0 SH CR/8 (SUTURE) ×2 IMPLANT
SUT STEEL 6MS V (SUTURE) ×6 IMPLANT
SUT STEEL STERNAL CCS#1 18IN (SUTURE) IMPLANT
SUT STEEL SZ 6 DBL 3X14 BALL (SUTURE) ×4 IMPLANT
SUT VIC AB 1 CTX 18 (SUTURE) ×4 IMPLANT
SUT VIC AB 1 CTX 36 (SUTURE) ×8
SUT VIC AB 1 CTX36XBRD ANBCTR (SUTURE) ×4 IMPLANT
SUT VIC AB 2-0 CT1 27 (SUTURE)
SUT VIC AB 2-0 CT1 TAPERPNT 27 (SUTURE) IMPLANT
SUT VIC AB 2-0 CTX 27 (SUTURE) ×4 IMPLANT
SUT VIC AB 3-0 SH 27 (SUTURE)
SUT VIC AB 3-0 SH 27X BRD (SUTURE) IMPLANT
SUT VIC AB 3-0 SH 8-18 (SUTURE) ×2 IMPLANT
SUT VIC AB 3-0 X1 27 (SUTURE) ×4 IMPLANT
SUT VICRYL 4-0 PS2 18IN ABS (SUTURE) IMPLANT
SUTURE E-PAK OPEN HEART (SUTURE) ×2 IMPLANT
SWAB COLLECTION DEVICE MRSA (MISCELLANEOUS) ×2 IMPLANT
SYR 10ML KIT SKIN ADHESIVE (MISCELLANEOUS) ×2 IMPLANT
SYR 20CC LL (SYRINGE) ×4 IMPLANT
SYSTEM SAHARA CHEST DRAIN ATS (WOUND CARE) ×6 IMPLANT
TAPE CLOTH SURG 4X10 WHT LF (GAUZE/BANDAGES/DRESSINGS) ×2 IMPLANT
TOWEL OR 17X24 6PK STRL BLUE (TOWEL DISPOSABLE) ×8 IMPLANT
TOWEL OR 17X26 10 PK STRL BLUE (TOWEL DISPOSABLE) ×8 IMPLANT
TRAY FOLEY IC TEMP SENS 14FR (CATHETERS) ×2 IMPLANT
TUBE ANAEROBIC SPECIMEN COL (MISCELLANEOUS) ×2 IMPLANT
TUBE SUCT INTRACARD DLP 20F (MISCELLANEOUS) ×2 IMPLANT
UNDERPAD 30X30 INCONTINENT (UNDERPADS AND DIAPERS) ×4 IMPLANT
WATER STERILE IRR 1000ML POUR (IV SOLUTION) ×8 IMPLANT

## 2015-02-05 NOTE — Progress Notes (Signed)
Rt transported pt from 2S08 to OR on ventilator. Pt stable throughout with no complications. RT will continue to monitor upon return from OR.

## 2015-02-05 NOTE — Progress Notes (Signed)
The patient was examined and preop studies reviewed. There has been no change from the prior exam and the patient is ready for surgery.  Plan removal 75f Centrimag LVAD today on M Knipfer

## 2015-02-05 NOTE — Progress Notes (Signed)
Advanced Heart Failure Rounding Note   Subjective:    S/p CABG with Centrimag placement 02/01/15  Remains intubated/sedated. On epi, milrinone, and dopamine.  Intermittent norepi. Received 1PRBCs yesterday.    Centrimag turned down to 2400.   Hemoglobin 10.5>  9.6   HIT negative by SRA.    Good diuresis yesterday.   Swan: CVP 8 PA 25/12 CI 3.2 Co-ox 79%  Objective:   Weight Range:  Vital Signs:   Temp:  [98.6 F (37 C)-99.3 F (37.4 C)] 99.1 F (37.3 C) (09/30 0815) Pulse Rate:  [33-130] 99 (09/30 0815) Resp:  [0-20] 0 (09/30 0815) BP: (84-101)/(61-78) 101/61 mmHg (09/30 0720) SpO2:  [81 %-100 %] 100 % (09/30 0815) Arterial Line BP: (64-167)/(56-95) 107/65 mmHg (09/30 0815) FiO2 (%):  [40 %] 40 % (09/30 0720) Weight:  [123 lb 14.4 oz (56.2 kg)] 123 lb 14.4 oz (56.2 kg) (09/30 0545) Last BM Date: 01/31/15  Weight change: Filed Weights   02/03/15 0426 02/04/15 0500 02/05/15 0545  Weight: 120 lb 13 oz (54.8 kg) 128 lb 8.5 oz (58.3 kg) 123 lb 14.4 oz (56.2 kg)    Intake/Output:   Intake/Output Summary (Last 24 hours) at 02/05/15 0914 Last data filed at 02/05/15 0800  Gross per 24 hour  Intake 3819.06 ml  Output   5980 ml  Net -2160.94 ml     Physical Exam: General:  Intubated sedated HEENT: normal x ETT Neck: supple. RIJ swan Cor: Chest open with wound vac. RRR Lungs: clear Abdomen: soft, nontender, + distended. Hypoactive bowel sounds. Extremities: no cyanosis, clubbing, rash, 1+ ankle edema Neuro: intubated/sedated  Telemetry: A paced 90s  Labs: Basic Metabolic Panel:  Recent Labs Lab 02/01/15 0241  02/02/15 0402  02/02/15 1723  02/03/15 0405  02/04/15 0410 02/04/15 1135 02/04/15 1509 02/04/15 2120 02/05/15 0400  NA 141  < > 139  < >  --   < > 136  < > 137 137 136 136 135  K 4.5  < > 4.8  < >  --   < > 3.8  < > 3.9 4.6 4.1 4.3 3.8  CL 112*  < > 110  < >  --   --  105  < > 108 104 104 102 105  CO2 25  --  22  --   --   --  20*  --   21*  --   --   --  22  GLUCOSE 123*  < > 119*  < >  --   < > 154*  < > 109* 119* 141* 106* 114*  BUN 17  < > 13  < >  --   --  16  < > 19 22* 24* 24* 22*  CREATININE 0.95  < > 1.06*  < > 1.16*  --  1.35*  < > 1.21* 1.30* 1.30* 1.30* 1.26*  CALCIUM 8.8*  --  7.4*  --   --   --  8.1*  --  8.1*  --   --   --  8.3*  MG  --   --  2.8*  --  2.4  --   --   --  1.7  --   --   --  2.0  < > = values in this interval not displayed.  Liver Function Tests:  Recent Labs Lab 01/31/15 0315 02/04/15 0410 02/05/15 0400  AST 21 52* 57*  ALT ALKPHOS 69 51 55  BILITOT 0.4  1.7* 1.6*  PROT 7.3 4.7* 5.2*  ALBUMIN 3.2* 3.3* 3.6   No results for input(s): LIPASE, AMYLASE in the last 168 hours. No results for input(s): AMMONIA in the last 168 hours.  CBC:  Recent Labs Lab 02/03/15 1205  02/04/15 02/04/15 0410 02/04/15 1135 02/04/15 1509 02/04/15 2120 02/05/15 0400  WBC 9.0  --  11.8* 10.7* 13.6*  --   --  12.4*  HGB 7.4*  < > 10.3* 8.9* 10.9*  11.2* 11.2* 10.5* 9.6*  HCT 20.8*  < > 29.6* 26.7* 31.5*  33.0* 33.0* 31.0* 27.6*  MCV 81.6  --  82.9 82.9 84.5  --   --  84.9  PLT 88*  --  69* 114* 104*  --   --  85*  < > = values in this interval not displayed.  Cardiac Enzymes: No results for input(s): CKTOTAL, CKMB, CKMBINDEX, TROPONINI in the last 168 hours.  BNP: BNP (last 3 results)  Recent Labs  07/10/14 1315  BNP 1059.1*    ProBNP (last 3 results) No results for input(s): PROBNP in the last 8760 hours.    Other results:  Imaging: Dg Chest Port 1 View  02/05/2015   CLINICAL DATA:  Left ventricular assist device.  EXAM: PORTABLE CHEST - 1 VIEW  COMPARISON:  One-view chest 02/04/2015.  FINDINGS: The heart is mildly enlarged. Left ventricular assist device remains in place. Additional drains are stable. The patient remains intubated. Bilateral chest tubes are in place. A Swan-Ganz catheter is in place. A left-sided PICC line remains. Left greater than right pleural  effusions are again noted. Left basilar airspace disease likely reflects atelectasis. Lung volumes are slightly decreased compared with the prior exam.  IMPRESSION: 1. Left ventricular assist device is stable. 2. Trans tubes are stable. 3. Decreasing lung volumes. 4. Left greater than right pleural effusions and associated atelectasis.   Electronically Signed   By: Marin Roberts M.D.   On: 02/05/2015 07:51   Dg Chest Port 1 View  02/04/2015   CLINICAL DATA:  Left ventricular assist device  EXAM: PORTABLE CHEST 1 VIEW  COMPARISON:  02/03/2015  FINDINGS: Endotracheal tube remains in good position and unchanged. Mediastinal drain in place. Bilateral chest tubes in place. No pneumothorax. Swan-Ganz catheter right lower pulmonary artery  Left ventricular assist device unchanged in position  Mild bibasilar atelectasis. Negative for heart failure or pneumonia.  IMPRESSION: Support lines remain in good position.  Mild bibasilar atelectasis unchanged.  No acute findings.   Electronically Signed   By: Marlan Palau M.D.   On: 02/04/2015 07:24   Dg Chest Port 1 View  02/03/2015   CLINICAL DATA:  Status post LVAD.  Decreased oxygen saturation.  EXAM: PORTABLE CHEST 1 VIEW  COMPARISON:  Portable film earlier in the day.  FINDINGS: Mild vascular congestion and slight subsegmental atelectasis without significant volume loss, consolidation, or edema. LVAD device unchanged. BILATERAL chest tubes and mediastinal tube stable. Endotracheal tube stable. Swan-Ganz catheter tip RIGHT lower lobe pulmonary artery. Nasogastric tube in the stomach.  IMPRESSION: Stable chest.  No evidence for significant consolidation or edema.   Electronically Signed   By: Elsie Stain M.D.   On: 02/03/2015 12:00     Medications:     Scheduled Medications: . acetaminophen  1,000 mg Oral 4 times per day   Or  . acetaminophen (TYLENOL) oral liquid 160 mg/5 mL  1,000 mg Per Tube 4 times per day  . antiseptic oral rinse  7 mL Mouth Rinse  q12n4p  . aspirin EC  325 mg Oral Daily   Or  . aspirin  324 mg Per Tube Daily  . bisacodyl  10 mg Oral Daily   Or  . bisacodyl  10 mg Rectal Daily  . cefUROXime (ZINACEF)  IV  1.5 g Intravenous 3 times per day  . chlorhexidine  15 mL Mouth Rinse BID  . docusate sodium  200 mg Oral Daily  . fluticasone  1 spray Each Nare Daily  . furosemide  40 mg Intravenous Q12H  . insulin aspart  0-24 Units Subcutaneous 6 times per day  . insulin regular  0-10 Units Intravenous TID WC  . metoCLOPramide (REGLAN) injection  10 mg Intravenous 4 times per day  . metoprolol tartrate  12.5 mg Oral BID   Or  . metoprolol tartrate  12.5 mg Per Tube BID  . mupirocin ointment  1 application Topical BID  . pantoprazole sodium  40 mg Per Tube Daily  . potassium chloride  10 mEq Intravenous Q1 Hr x 3  . pravastatin  20 mg Oral q1800  . sodium chloride  10-40 mL Intracatheter Q12H  . sodium chloride  3 mL Intravenous Q12H  . vancomycin  500 mg Intravenous Q24H    Infusions: . sodium chloride 20 mL/hr at 02/05/15 0800  . sodium chloride    . sodium chloride 20 mL/hr at 02/05/15 0800  . bivalirudin (ANGIOMAX) infusion 0.5 mg/mL (Non-ACS indications) 0.05 mg/kg/hr (02/05/15 0852)  . DOPamine 2.5 mcg/kg/min (02/05/15 0800)  . epinephrine 2.5 mcg/min (02/05/15 0800)  . insulin (NOVOLIN-R) infusion Stopped (02/02/15 0800)  . lactated ringers Stopped (02/02/15 2000)  . lactated ringers 20 mL/hr at 02/05/15 0800  . milrinone 0.3 mcg/kg/min (02/05/15 0800)  . morphine 2 mg/hr (02/05/15 0800)  . nitroGLYCERIN Stopped (02/03/15 1340)  . nitroPRUSSide Stopped (02/05/15 0909)  . norepinephrine (LEVOPHED) Adult infusion Stopped (02/05/15 0800)  . propofol (DIPRIVAN) infusion 25 mcg/kg/min (02/05/15 0800)    PRN Medications: sodium chloride, albumin human, HYDROmorphone (DILAUDID) injection, metoprolol, midazolam, midazolam, ondansetron (ZOFRAN) IV, oxyCODONE, sodium chloride, sodium chloride,  traMADol   Assessment:   1. Cardiogenic shock   --s/p CABG with centrimag placement 02/01/15 2. CAD s/p CABG 3. 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 Acute respiratory failure 5. Acute blood loss anemia 6. Thrombocytopenia: HIT negative by SRA   Plan/Discussion:    Volume status a little better. Renal function stable. Continue milrinone, dopamine, and epi.  Centrimag weaned to 2400. Plan to return to OR today.   Length of Stay: 10  CLEGG,AMY NP-C  02/05/2015, 9:14 AM  Advanced Heart Failure Team Pager [phone] (M-F; 7a - 4p)  Please contact [facility] for night-coverage after hours (4p -7a ) and weekends on [email or web address]  Patient seen with NP, agree with the above note.   On bivalirudin with decreased plts, however HIT negative by SRA (back this morning).   Good diuresis today, CVP ranging 8-11 this morning.  Can continue Lasix 40 mg IV bid today. Creatinine stable.   CI 3.2 this morning.  Remains on epi at 2.5, norepi at 4, milrinone 0.3, and dopamine 2.5.  Have been able to wean Centrimag LVAD to 2400, plan to continue to wean down to 2000 rpm prior to OR.  She is stable, should be ready for LVAD decannulation in OR today.  Would leave drips the same for the time being.   Marca Ancona 02/05/2015 10:11 AM

## 2015-02-05 NOTE — Transfer of Care (Signed)
Immediate Anesthesia Transfer of Care Note  Patient: Breanna Alvarez  Procedure(s) Performed: Procedure(s): REMOVAL OF CENTRIMAG VENTRICULAR ASSIST DEVICE (N/A) CANNULATION FOR CARDIOPULMONARY BYPASS (N/A) TRANSESOPHAGEAL ECHOCARDIOGRAM (TEE) (N/A)  Patient Location: SICU  Anesthesia Type:General  Level of Consciousness: Patient remains intubated per anesthesia plan  Airway & Oxygen Therapy: Patient remains intubated per anesthesia plan and Patient placed on Ventilator (see vital sign flow sheet for setting)  Post-op Assessment: Report given to RN and Post -op Vital signs reviewed and stable  Post vital signs: Reviewed and stable  Last Vitals:  Filed Vitals:   02/05/15 1700  BP:   Pulse: 113  Temp: 37.7 C  Resp: 14    Complications: No apparent anesthesia complications

## 2015-02-05 NOTE — Anesthesia Postprocedure Evaluation (Signed)
Anesthesia Post Note  Patient: Breanna Alvarez  Procedure(s) Performed: Procedure(s) (LRB): REMOVAL OF CENTRIMAG VENTRICULAR ASSIST DEVICE (N/A) CANNULATION FOR CARDIOPULMONARY BYPASS (N/A) TRANSESOPHAGEAL ECHOCARDIOGRAM (TEE) (N/A)  Anesthesia type: General  Patient location: ICU  Post pain: Pain level controlled  Post assessment: Post-op Vital signs reviewed  Last Vitals:  Filed Vitals:   02/05/15 2130  BP: 125/52  Pulse: 106  Temp: 37.2 C  Resp: 14    Post vital signs: stable  Level of consciousness: Patient remains intubated per anesthesia plan  Complications: No apparent anesthesia complications

## 2015-02-05 NOTE — Progress Notes (Signed)
I-stat ABG collected at 0040 not crossing over via docking station.  Results 7.39, 37.7, 140, -1/23.1, 24, 99%.

## 2015-02-05 NOTE — Brief Op Note (Signed)
01/26/2015 - 02/05/2015  8:26 PM  PATIENT:  Breanna Alvarez  64 y.o. female  PRE-OPERATIVE DIAGNOSIS:  ICM  POST-OPERATIVE DIAGNOSIS:  ICM  PROCEDURE:  Procedure(s): REMOVAL OF CENTRIMAG VENTRICULAR ASSIST DEVICE (N/A) CANNULATION FOR CARDIOPULMONARY BYPASS (N/A) TRANSESOPHAGEAL ECHOCARDIOGRAM (TEE) (N/A) Sternal wound closed but sternal bone left apart due to cardiac swelling SURGEON:  Surgeon(s) and Role:    * Kerin Perna, MD - Primary  PHYSICIAN ASSISTANT: 0  ASSISTANTS: none   ANESTHESIA:   general  EBL:  Total I/O In: 1184 [Blood:1184] Out: 650 [Urine:650]  BLOOD ADMINISTERED:400 CC PRBC  DRAINS: 2 MTs  with L and R pleural tubes   LOCAL MEDICATIONS USED:  NONE  SPECIMEN:  No Specimen  DISPOSITION OF SPECIMEN:  N/A  COUNTS:  YES  TOURNIQUET:  * No tourniquets in log *  DICTATION: .Dragon Dictation  PLAN OF CARE: Admit to inpatient   PATIENT DISPOSITION:  ICU - intubated and critically ill.   Delay start of Pharmacological VTE agent (>24hrs) due to surgical blood loss or risk of bleeding: yes

## 2015-02-05 NOTE — Anesthesia Preprocedure Evaluation (Signed)
Anesthesia Evaluation  Patient identified by MRN, date of birth, ID band Patient unresponsive  General Assessment Comment:Intubated and sedated  Reviewed: Allergy & Precautions, H&P , NPO status , Patient's Chart, lab work & pertinent test results, Unable to perform ROS - Chart review only  Airway Mallampati: Intubated       Dental no notable dental hx.    Pulmonary neg pulmonary ROS,    Pulmonary exam normal breath sounds clear to auscultation       Cardiovascular hypertension, Pt. on medications + angina + CAD, + Past MI, + CABG and +CHF   Rhythm:Regular Rate:Normal  EF 10-20%   Neuro/Psych negative neurological ROS  negative psych ROS   GI/Hepatic negative GI ROS, Neg liver ROS,   Endo/Other  negative endocrine ROS  Renal/GU negative Renal ROS  negative genitourinary   Musculoskeletal   Abdominal   Peds  Hematology negative hematology ROS (+)   Anesthesia Other Findings   Reproductive/Obstetrics negative OB ROS                             Anesthesia Physical Anesthesia Plan  ASA: IV  Anesthesia Plan: General   Post-op Pain Management:    Induction: Intravenous  Airway Management Planned: Oral ETT  Additional Equipment:   Intra-op Plan:   Post-operative Plan: Post-operative intubation/ventilation  Informed Consent: I have reviewed the patients History and Physical, chart, labs and discussed the procedure including the risks, benefits and alternatives for the proposed anesthesia with the patient or authorized representative who has indicated his/her understanding and acceptance.   Dental advisory given  Plan Discussed with: CRNA  Anesthesia Plan Comments: (Pt. Intubated previously and has all lines in place from CABG. )        Anesthesia Quick Evaluation

## 2015-02-06 ENCOUNTER — Inpatient Hospital Stay (HOSPITAL_COMMUNITY): Payer: Medicaid Other

## 2015-02-06 LAB — COMPREHENSIVE METABOLIC PANEL
ALT: 27 U/L (ref 14–54)
AST: 65 U/L — ABNORMAL HIGH (ref 15–41)
Albumin: 3.1 g/dL — ABNORMAL LOW (ref 3.5–5.0)
Alkaline Phosphatase: 62 U/L (ref 38–126)
Anion gap: 13 (ref 5–15)
BUN: 22 mg/dL — ABNORMAL HIGH (ref 6–20)
CO2: 19 mmol/L — ABNORMAL LOW (ref 22–32)
Calcium: 8.5 mg/dL — ABNORMAL LOW (ref 8.9–10.3)
Chloride: 104 mmol/L (ref 101–111)
Creatinine, Ser: 1.24 mg/dL — ABNORMAL HIGH (ref 0.44–1.00)
GFR calc Af Amer: 53 mL/min — ABNORMAL LOW (ref >60)
GFR calc non Af Amer: 46 mL/min — ABNORMAL LOW (ref >60)
Glucose, Bld: 136 mg/dL — ABNORMAL HIGH (ref 65–99)
Potassium: 4.6 mmol/L (ref 3.5–5.1)
Sodium: 136 mmol/L (ref 135–145)
Total Bilirubin: 2.6 mg/dL — ABNORMAL HIGH (ref 0.3–1.2)
Total Protein: 5 g/dL — ABNORMAL LOW (ref 6.5–8.1)

## 2015-02-06 LAB — CBC
HCT: 29.3 % — ABNORMAL LOW (ref 36.0–46.0)
HCT: 27.6 % — ABNORMAL LOW (ref 36.0–46.0)
Hemoglobin: 10.2 g/dL — ABNORMAL LOW (ref 12.0–15.0)
Hemoglobin: 9.4 g/dL — ABNORMAL LOW (ref 12.0–15.0)
MCH: 29.8 pg (ref 26.0–34.0)
MCH: 29 pg (ref 26.0–34.0)
MCHC: 34.8 g/dL (ref 30.0–36.0)
MCHC: 34.1 g/dL (ref 30.0–36.0)
MCV: 85.7 fL (ref 78.0–100.0)
MCV: 85.2 fL (ref 78.0–100.0)
Platelets: 68 10*3/uL — ABNORMAL LOW (ref 150–400)
Platelets: 79 10*3/uL — ABNORMAL LOW (ref 150–400)
RBC: 3.42 MIL/uL — ABNORMAL LOW (ref 3.87–5.11)
RBC: 3.24 MIL/uL — ABNORMAL LOW (ref 3.87–5.11)
RDW: 16.1 % — ABNORMAL HIGH (ref 11.5–15.5)
RDW: 16.1 % — ABNORMAL HIGH (ref 11.5–15.5)
WBC: 11.6 10*3/uL — ABNORMAL HIGH (ref 4.0–10.5)
WBC: 13.6 10*3/uL — ABNORMAL HIGH (ref 4.0–10.5)

## 2015-02-06 LAB — POCT I-STAT, CHEM 8
BUN: 21 mg/dL — AB (ref 6–20)
CHLORIDE: 98 mmol/L — AB (ref 101–111)
CREATININE: 1.2 mg/dL — AB (ref 0.44–1.00)
Calcium, Ion: 1.13 mmol/L (ref 1.13–1.30)
Glucose, Bld: 108 mg/dL — ABNORMAL HIGH (ref 65–99)
HEMATOCRIT: 29 % — AB (ref 36.0–46.0)
Hemoglobin: 9.9 g/dL — ABNORMAL LOW (ref 12.0–15.0)
Potassium: 3.6 mmol/L (ref 3.5–5.1)
SODIUM: 132 mmol/L — AB (ref 135–145)
TCO2: 18 mmol/L (ref 0–100)

## 2015-02-06 LAB — POCT I-STAT 3, ART BLOOD GAS (G3+)
ACID-BASE DEFICIT: 3 mmol/L — AB (ref 0.0–2.0)
Acid-base deficit: 3 mmol/L — ABNORMAL HIGH (ref 0.0–2.0)
BICARBONATE: 20.9 meq/L (ref 20.0–24.0)
Bicarbonate: 20.5 mEq/L (ref 20.0–24.0)
O2 Saturation: 99 %
O2 Saturation: 99 %
PCO2 ART: 32.3 mmHg — AB (ref 35.0–45.0)
PCO2 ART: 33.5 mmHg — AB (ref 35.0–45.0)
PH ART: 7.415 (ref 7.350–7.450)
PO2 ART: 134 mmHg — AB (ref 80.0–100.0)
PO2 ART: 130 mmHg — AB (ref 80.0–100.0)
Patient temperature: 100.4
Patient temperature: 38
TCO2: 21 mmol/L (ref 0–100)
TCO2: 22 mmol/L (ref 0–100)
pH, Arterial: 7.408 (ref 7.350–7.450)

## 2015-02-06 LAB — PREPARE RBC (CROSSMATCH)

## 2015-02-06 LAB — CALCIUM, IONIZED: Calcium, Ionized, Serum: 4.7 mg/dL (ref 4.5–5.6)

## 2015-02-06 LAB — GLUCOSE, CAPILLARY
GLUCOSE-CAPILLARY: 111 mg/dL — AB (ref 65–99)
GLUCOSE-CAPILLARY: 150 mg/dL — AB (ref 65–99)
Glucose-Capillary: 138 mg/dL — ABNORMAL HIGH (ref 65–99)
Glucose-Capillary: 131 mg/dL — ABNORMAL HIGH (ref 65–99)

## 2015-02-06 LAB — PREPARE FRESH FROZEN PLASMA
Unit division: 0
Unit division: 0
Unit division: 0

## 2015-02-06 LAB — TRIGLYCERIDES: Triglycerides: 148 mg/dL (ref <150)

## 2015-02-06 LAB — MAGNESIUM
Magnesium: 3 mg/dL — ABNORMAL HIGH (ref 1.7–2.4)
Magnesium: 2.2 mg/dL (ref 1.7–2.4)

## 2015-02-06 LAB — CREATININE, SERUM
Creatinine, Ser: 1.13 mg/dL — ABNORMAL HIGH (ref 0.44–1.00)
GFR calc Af Amer: 59 mL/min — ABNORMAL LOW (ref >60)
GFR calc non Af Amer: 51 mL/min — ABNORMAL LOW (ref >60)

## 2015-02-06 LAB — CARBOXYHEMOGLOBIN
Carboxyhemoglobin: 1.4 % (ref 0.5–1.5)
Methemoglobin: 1.6 % — ABNORMAL HIGH (ref 0.0–1.5)
O2 Saturation: 71.1 %
Total hemoglobin: 10.9 g/dL — ABNORMAL LOW (ref 12.0–16.0)

## 2015-02-06 LAB — PREPARE PLATELET PHERESIS: Unit division: 0

## 2015-02-06 LAB — VANCOMYCIN, RANDOM: Vancomycin Rm: 16 ug/mL

## 2015-02-06 MED ORDER — POTASSIUM CHLORIDE 10 MEQ/50ML IV SOLN
10.0000 meq | INTRAVENOUS | Status: AC
  Administered 2015-02-06 (×3): 10 meq via INTRAVENOUS
  Filled 2015-02-06 (×3): qty 50

## 2015-02-06 MED ORDER — FUROSEMIDE 10 MG/ML IJ SOLN
4.0000 mg/h | INTRAVENOUS | Status: DC
  Administered 2015-02-06: 4 mg/h via INTRAVENOUS
  Administered 2015-02-07: 8 mg/h via INTRAVENOUS
  Administered 2015-02-08: 16:00:00 via INTRAVENOUS
  Administered 2015-02-09: 4 mg/h via INTRAVENOUS
  Filled 2015-02-06 (×7): qty 25

## 2015-02-06 MED ORDER — TRACE MINERALS CR-CU-MN-SE-ZN 10-1000-500-60 MCG/ML IV SOLN
INTRAVENOUS | Status: AC
  Administered 2015-02-06: 18:00:00 via INTRAVENOUS
  Filled 2015-02-06 (×2): qty 1000

## 2015-02-06 NOTE — Progress Notes (Signed)
Follow Up Nutrition Assessment  DOCUMENTATION CODES:  Not applicable  INTERVENTION:  TPN per pharmacy Note: Pt is receiving 108 kcals from sedative  NUTRITION DIAGNOSIS:  Inadequate oral intake related to inability to eat as evidenced by NPO status.  Ongoing  GOAL:  Patient will meet greater than or equal to 90% of their needs  Unmet  MONITOR:  Vent status, Labs, Weight trends, Skin, I & O's  ASSESSMENT:  64 y.o. Femalewho had a NSTEMI in early 2016; diagnosed with multivessel CAD; presented for surgical intervention.  Patient s/p procedure 9/26: CORONARY ARTERY BYPASS GRAFTING x 4 PLACEMENT OF CENTRIMAG VENTRICULAR ASSIST DEVICE  Notable Interval 9/26-10/1: Centrimag removed 9/30. Chest remains open. Intubated, sedated. TPN for ileus  Patient is currently intubated on ventilator support MV: 7 L/min Temp (24hrs), Avg:99.6 F (37.6 C), Min:98.8 F (37.1 C), Max:100.6 F (38.1 C)  Propofol: 4.1 ml/hr = 108 kcals  Diet Order:  Diet NPO time specified .TPN (CLINIMIX-E) Adult  Skin:  Multiple surgical wounds/incisions  Last BM:  9/25  Height:  Ht Readings from Last 1 Encounters:  01/26/15 5' (1.524 m)   Weight:  Wt Readings from Last 1 Encounters:  02/06/15 133 lb 9.6 oz (60.6 kg)   Wt Readings from Last 10 Encounters:  02/06/15 133 lb 9.6 oz (60.6 kg)  01/22/15 102 lb 3.2 oz (46.358 kg)  10/01/14 104 lb 12.8 oz (47.537 kg)  08/10/14 106 lb (48.081 kg)  07/15/14 107 lb 4.8 oz (48.671 kg)  07/10/14 112 lb (50.803 kg)  Dosing: 100 lbs (45.45 kg)  Ideal Body Weight:  45.4 kg  BMI:  Body mass index is 26.09 kg/(m^2).  Estimated Nutritional Needs:  Kcal:  1270 Protein:  >90 g (2 g/kg bw) Fluid:  per MD  EDUCATION NEEDS:  No education needs identified at this time  Christophe Louis RD, LDN Nutrition Pager: [phone] 02/06/2015 1:52 PM

## 2015-02-06 NOTE — Progress Notes (Addendum)
PARENTERAL NUTRITION CONSULT NOTE - INITIAL  Pharmacy Consult for TPN Indication: Prolonged ileus  Allergies  Allergen Reactions  . Atorvastatin Other (See Comments)    Gas, chest tightness  . Crestor [Rosuvastatin Calcium] Other (See Comments)    Muscle aches   Patient Measurements: Height: 5' (152.4 cm) Weight: 133 lb 9.6 oz (60.6 kg) IBW/kg (Calculated) : 45.5  Usual Weight: ~100-106 pounds (46-48 kg)  Vital Signs: Temp: 100.4 F (38 C) (10/01 0900) Temp Source: Core (Comment) (10/01 0400) BP: 109/47 mmHg (10/01 0900) Pulse Rate: 115 (10/01 0900) Intake/Output from previous day: 09/30 0701 - 10/01 0700 In: 5994.9 [I.V.:2925.9; Blood:1459; NG/GT:210; IV Piggyback:1400] Out: 7305 [Urine:4595; Emesis/NG output:450; Blood:1500; Chest Tube:760] Intake/Output from this shift: Total I/O In: -  Out: 215 [Urine:125; Chest Tube:90] Labs:  Recent Labs  02/04/15 0410  02/05/15 0400 02/05/15 1100  02/05/15 1110 02/05/15 1606  02/05/15 2009 02/05/15 2200 02/05/15 2208 02/06/15 0421  WBC 10.7*  < > 12.4* 10.9*  --   --   --   --   --  12.0*  --  11.6*  HGB 8.9*  < > 9.6* 9.4*  < >  --   --   < > 8.3* 9.9* 9.5* 10.2*  HCT 26.7*  < > 27.6* 28.0*  < >  --   --   < > 23.7* 28.8* 28.0* 29.3*  PLT 114*  < > 85* 77*  --   --   --   --  40* 54*  --  68*  APTT 103*  < > 71*  --   --  68* 72*  --   --  48*  --   --   INR 1.47  --  1.60*  --   --   --   --   --   --  1.51*  --   --   < > = values in this interval not displayed.  Recent Labs  02/04/15 0410  02/05/15 0400  02/05/15 1859 02/05/15 1939 02/05/15 2208 02/06/15 0421  NA 137  < > 135  < > 134* 136 135 136  K 3.9  < > 3.8  < > 4.5 3.5 3.3* 4.6  CL 108  < > 105  < > 105 104  --  104  CO2 21*  --  22  --   --   --   --  19*  GLUCOSE 109*  < > 114*  < > 132* 114* 119* 136*  BUN 19  < > 22*  < > 21* 23*  --  22*  CREATININE 1.21*  < > 1.26*  < > 1.00 1.10*  --  1.24*  CALCIUM 8.1*  --  8.3*  --   --   --   --  8.5*   MG 1.7  --  2.0  --   --   --   --  3.0*  PROT 4.7*  --  5.2*  --   --   --   --  5.0*  ALBUMIN 3.3*  --  3.6  --   --   --   --  3.1*  AST 52*  --  57*  --   --   --   --  65*  ALT 24  --  23  --   --   --   --  27  ALKPHOS 51  --  55  --   --   --   --  62  BILITOT 1.7*  --  1.6*  --   --   --   --  2.6*  < > = values in this interval not displayed. Estimated Creatinine Clearance: 38.2 mL/min (by C-G formula based on Cr of 1.24).   Recent Labs  02/05/15 2324 02/06/15 0414 02/06/15 0746  GLUCAP 111* 138* 111*   Medical History: Past Medical History  Diagnosis Date  . Hyperlipidemia   . Acute systolic CHF (congestive heart failure), NYHA class 3 07/10/2014  . Hypertension    Medications:  Infusions:  . sodium chloride 20 mL/hr at 02/06/15 0400  . sodium chloride    . sodium chloride    . sodium chloride 20 mL/hr at 02/05/15 1155  . sodium chloride    . sodium chloride    . epinephrine 2 mcg/min (02/06/15 0400)  . furosemide (LASIX) infusion    . milrinone 0.3 mcg/kg/min (02/06/15 0400)  . morphine 4 mg/hr (02/06/15 0400)  . norepinephrine (LEVOPHED) Adult infusion 10 mcg/min (02/06/15 0700)  . propofol (DIPRIVAN) infusion 50 mcg/kg/min (02/06/15 0500)   Insulin Requirements in the past 24 hours:  Received 2 units for CBG of 136 - CBG range (59-138)  Current Nutrition:  Currently NPO - attempts made to insert NG tube - failed, no bowel sounds present.    Assessment: 64 yo female with cardiac problems and recent CABG on 9/26 and Centrimag removal on 9/30 with an open sternum and plans for return to OR on Monday.  She was in her usual state of health prior to admit with a weight range of 100-105 pounds over the several months.  She has some fluid gain and her weight currently is ~ 133 pounds.  GI: Post-op ileus, no bowel sounds, no stools, has 477ml Emesis/NG output, 773ml chest tube out.  Albumin 3.1, AST/ALT normal, Alk Phos WNL.  T. Bili slightly elevated  2.6  Nutritional Goals:  1320 kCal, 100-110 grams of protein per day per RD  Electrolytes: Potassium ( received 64meq 9/30) slightly elevated as well as Magnesium (received 4gm 9/30).  Corrected calcium = 9.2 which is within normal range.  Renal: Creat. 1.2 (ranging 1.1-1.3) excellent diuresis (3.52ml/kg/hr) for past 2 days with furosemide ($RemoveBeforeDE'60mg'[id number]$  9/30).   IV fluids:   NS at 37ml/hr  Pulm:  Remains intubated and sedated with propofol (rate at 63mcg/kg/min)  IV Access PICC - placed 9/28 TPN - Day Zero  Plan:  -  Will begin Clinimix with electrolytes today at 37ml/hr and increase to a goal of 84 ml/hr to provide ~ 1420 kcal and 100 gm of protein.  Propofol provides 1.1kcal/ml ~ 433kcal (at current rate).  We may need to swap to alternative sedative to provide amount of protein and maintain kcal at estimated needs.  Clinimix at 6ml/hr and Propofol at 16.67ml/hr provides ~1280kcal but only 60gm protein. -  No Lipids for now while on propofol -  Will order TPN labs for AM reivew -  Continue current sliding scale coverage -  Add Pepcid to TPN to minimize fluid -  F/u AM labs  Rober Minion, PharmD., MS Clinical Pharmacist Pager:  [phone] Thank you for allowing pharmacy to be part of this patients care team. 02/06/2015,10:46 AM

## 2015-02-06 NOTE — Progress Notes (Signed)
Advanced Heart Failure Rounding Note   Subjective:    S/p CABG with Centrimag placement 02/01/15  Centrimag explanted 9/30. Chest remains open due to swelling.   Remains intubated/sedated. On epi 2, milrinone 0.3, and levophed 10. Apparently very sensitive to lasix  Co-ox 71% PA 33/24  Thermo 4.6/3.3   Platelets 68k. HIT negative by SRA.     Objective:   Weight Range:  Vital Signs:   Temp:  [98.6 F (37 C)-100.4 F (38 C)] 100.4 F (38 C) (10/01 0700) Pulse Rate:  [81-127] 121 (10/01 0700) Resp:  [0-28] 14 (10/01 0700) BP: (84-134)/(32-82) 101/32 mmHg (10/01 0700) SpO2:  [81 %-100 %] 100 % (10/01 0700) Arterial Line BP: (76-203)/(41-96) 125/52 mmHg (09/30 2130) FiO2 (%):  [40 %-50 %] 40 % (10/01 0750) Weight:  [60.6 kg (133 lb 9.6 oz)] 60.6 kg (133 lb 9.6 oz) (10/01 0500) Last BM Date: 01/31/15  Weight change: Filed Weights   02/04/15 0500 02/05/15 0545 02/06/15 0500  Weight: 58.3 kg (128 lb 8.5 oz) 56.2 kg (123 lb 14.4 oz) 60.6 kg (133 lb 9.6 oz)    Intake/Output:   Intake/Output Summary (Last 24 hours) at 02/06/15 0852 Last data filed at 02/06/15 0800  Gross per 24 hour  Intake 5849.37 ml  Output   7200 ml  Net -1350.63 ml     Physical Exam: General:  Intubated sedated HEENT: normal x ETT Neck: supple. RIJ swan Cor: Chest open with dressing. Tachy regular Lungs: clear Abdomen: soft, nontender, + distended. Hypoactive bowel sounds. Extremities: no cyanosis, clubbing, rash, 2+ edema Neuro: intubated/sedated  Telemetry: Sinus tach 110-120  Labs: Basic Metabolic Panel:  Recent Labs Lab 02/02/15 0402  02/02/15 1723  02/03/15 0405  02/04/15 0410  02/05/15 0400  02/05/15 1753 02/05/15 1843 02/05/15 1859 02/05/15 1939 02/05/15 2208 02/06/15 0421  NA 139  < >  --   < > 136  < > 137  < > 135  < > 130* 134* 134* 136 135 136  K 4.8  < >  --   < > 3.8  < > 3.9  < > 3.8  < > 3.9 3.7 4.5 3.5 3.3* 4.6  CL 110  < >  --   --  105  < > 108  < > 105   < > 112* 104 105 104  --  104  CO2 22  --   --   --  20*  --  21*  --  22  --   --   --   --   --   --  19*  GLUCOSE 119*  < >  --   < > 154*  < > 109*  < > 114*  < > 102* 112* 132* 114* 119* 136*  BUN 13  < >  --   --  16  < > 19  < > 22*  < > 23* 25* 21* 23*  --  22*  CREATININE 1.06*  < > 1.16*  --  1.35*  < > 1.21*  < > 1.26*  < > 1.20* 1.20* 1.00 1.10*  --  1.24*  CALCIUM 7.4*  --   --   --  8.1*  --  8.1*  --  8.3*  --   --   --   --   --   --  8.5*  MG 2.8*  --  2.4  --   --   --  1.7  --  2.0  --   --   --   --   --   --  3.0*  < > = values in this interval not displayed.  Liver Function Tests:  Recent Labs Lab 01/31/15 0315 02/04/15 0410 02/05/15 0400 02/06/15 0421  AST 21 52* 57* 65*  ALT ALKPHOS 69 51 55 62  BILITOT 0.4 1.7* 1.6* 2.6*  PROT 7.3 4.7* 5.2* 5.0*  ALBUMIN 3.2* 3.3* 3.6 3.1*   No results for input(s): LIPASE, AMYLASE in the last 168 hours. No results for input(s): AMMONIA in the last 168 hours.  CBC:  Recent Labs Lab 02/04/15 1135  02/05/15 0400 02/05/15 1100  02/05/15 1939 02/05/15 2009 02/05/15 2200 02/05/15 2208 02/06/15 0421  WBC 13.6*  --  12.4* 10.9*  --   --   --  12.0*  --  11.6*  HGB 10.9*  11.2*  < > 9.6* 9.4*  < > 8.2* 8.3* 9.9* 9.5* 10.2*  HCT 31.5*  33.0*  < > 27.6* 28.0*  < > 24.0* 23.7* 28.8* 28.0* 29.3*  MCV 84.5  --  84.9 85.1  --   --   --  85.7  --  85.7  PLT 104*  --  85* 77*  --   --  40* 54*  --  68*  < > = values in this interval not displayed.  Cardiac Enzymes: No results for input(s): CKTOTAL, CKMB, CKMBINDEX, TROPONINI in the last 168 hours.  BNP: BNP (last 3 results)  Recent Labs  07/10/14 1315  BNP 1059.1*    ProBNP (last 3 results) No results for input(s): PROBNP in the last 8760 hours.    Other results:  Imaging: Dg Chest Port 1 View  02/06/2015   CLINICAL DATA:  Shortness of Breath/hypoxia  EXAM: PORTABLE CHEST 1 VIEW  COMPARISON:  February 05, 2015  FINDINGS: Endotracheal tube tip  is 4.7 cm above the carina. Swan-Ganz catheter tip is in the main pulmonary outflow tract directed toward the right. There are bilateral chest tubes and a mediastinal drain. Temporary pacemaker wires are attached to the right heart. There is a left subclavian catheter with the tip at the cavoatrial junction. No pneumothorax. There is mild atelectasis in the right mid lung and left base regions. There is no airspace consolidation. There is a minimal left effusion. Heart size is upper normal with pulmonary vascularity within normal limits. No adenopathy.  IMPRESSION: Tube and catheter positions as described without pneumothorax. Mild atelectatic change bilaterally. Minimal left effusion. No airspace consolidation. No change in cardiac silhouette.   Electronically Signed   By: Bretta Bang III M.D.   On: 02/06/2015 07:21   Dg Chest Port 1 View  02/05/2015   CLINICAL DATA:  Acute onset of shortness of breath. Initial encounter.  EXAM: PORTABLE CHEST 1 VIEW  COMPARISON:  Chest radiograph performed earlier today at 6:13 a.m.  FINDINGS: The patient's endotracheal tube is seen ending 5 cm above the carina. An enteric tube is noted extending below the diaphragm. Bilateral chest tubes and mediastinal drains are seen. A right IJ Swan-Ganz catheter is noted ending at the main pulmonary outflow tract. A left PICC is noted ending about the distal SVC.  The lungs are well-aerated. Mild bilateral atelectasis is noted. There is no evidence of pleural effusion or pneumothorax.  The cardiomediastinal silhouette is borderline enlarged. Postoperative change is noted along the midline chest wall, with changes of CABG. No acute osseous abnormalities are seen.  IMPRESSION: 1. Endotracheal tube seen ending 5 cm above the carina. Tubes and lines as described above. 2. Borderline cardiomegaly.  3. Mild bilateral atelectasis noted.   Electronically Signed   By: Roanna Raider M.D.   On: 02/05/2015 23:00   Dg Chest Port 1  View  02/05/2015   CLINICAL DATA:  Left ventricular assist device.  EXAM: PORTABLE CHEST - 1 VIEW  COMPARISON:  One-view chest 02/04/2015.  FINDINGS: The heart is mildly enlarged. Left ventricular assist device remains in place. Additional drains are stable. The patient remains intubated. Bilateral chest tubes are in place. A Swan-Ganz catheter is in place. A left-sided PICC line remains. Left greater than right pleural effusions are again noted. Left basilar airspace disease likely reflects atelectasis. Lung volumes are slightly decreased compared with the prior exam.  IMPRESSION: 1. Left ventricular assist device is stable. 2. Trans tubes are stable. 3. Decreasing lung volumes. 4. Left greater than right pleural effusions and associated atelectasis.   Electronically Signed   By: Marin Roberts M.D.   On: 02/05/2015 07:51     Medications:     Scheduled Medications: . sodium chloride   Intravenous Once  . acetaminophen  1,000 mg Oral 4 times per day   Or  . acetaminophen (TYLENOL) oral liquid 160 mg/5 mL  1,000 mg Per Tube 4 times per day  . antiseptic oral rinse  7 mL Mouth Rinse q12n4p  . aspirin EC  325 mg Oral Daily   Or  . aspirin  324 mg Per Tube Daily  . bisacodyl  10 mg Oral Daily   Or  . bisacodyl  10 mg Rectal Daily  . cefUROXime (ZINACEF)  IV  1.5 g Intravenous Q12H  . chlorhexidine  15 mL Mouth Rinse BID  . docusate sodium  200 mg Oral Daily  . famotidine (PEPCID) IV  20 mg Intravenous Q12H  . fluticasone  1 spray Each Nare Daily  . insulin aspart  0-24 Units Subcutaneous 6 times per day  . insulin regular  0-10 Units Intravenous TID WC  . metoCLOPramide (REGLAN) injection  10 mg Intravenous 4 times per day  . metoprolol tartrate  12.5 mg Oral BID   Or  . metoprolol tartrate  12.5 mg Per Tube BID  . mupirocin ointment  1 application Topical BID  . pantoprazole sodium  40 mg Per Tube Daily  . pravastatin  20 mg Oral q1800  . sodium chloride  10-40 mL Intracatheter  Q12H  . sodium chloride  3 mL Intravenous Q12H  . sodium chloride  3 mL Intravenous Q12H    Infusions: . sodium chloride 20 mL/hr at 02/06/15 0400  . sodium chloride    . sodium chloride    . sodium chloride 20 mL/hr at 02/05/15 1155  . sodium chloride    . sodium chloride    . dexmedetomidine    . DOPamine 2.5 mcg/kg/min (02/05/15 1700)  . epinephrine 2 mcg/min (02/06/15 0400)  . feeding supplement (VITAL 1.5 CAL)    . insulin (NOVOLIN-R) infusion Stopped (02/02/15 0800)  . lactated ringers Stopped (02/02/15 2000)  . lactated ringers 0 mL/hr at 02/05/15 1600  . lactated ringers 20 mL/hr at 02/06/15 0400  . lactated ringers 20 mL/hr at 02/06/15 0400  . milrinone 0.3 mcg/kg/min (02/06/15 0400)  . morphine 4 mg/hr (02/06/15 0400)  . nitroGLYCERIN Stopped (02/03/15 1340)  . nitroPRUSSide Stopped (02/05/15 0909)  . norepinephrine (LEVOPHED) Adult infusion 10 mcg/min (02/06/15 0700)  . propofol (DIPRIVAN) infusion 50 mcg/kg/min (02/06/15 0500)    PRN Medications: sodium chloride, sodium chloride, HYDROmorphone (DILAUDID) injection, lactated ringers, metoprolol, midazolam, midazolam,  ondansetron (ZOFRAN) IV, oxyCODONE, sodium chloride, sodium chloride, sodium chloride, traMADol   Assessment:   1. Cardiogenic shock   --s/p CABG with centrimag placement 02/01/15. Centrimag removed 9/30 2. CAD s/p CABG 3. 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 Acute respiratory failure 5. Acute blood loss anemia 6. Thrombocytopenia: HIT negative by SRA  Plan/Discussion:    Very tenuous but tolerating removal of Centrimag. Hemodynamics look good. Needs diuresis. Has been very sensitive to volume removal previously. Start lasix gtt at 4 and adjust as needed. Titrate inotropes as needed to support BP and diuresis.   The patient is critically ill with multiple organ systems failure and requires high complexity decision making for assessment and support, frequent evaluation and titration of therapies, application of  advanced monitoring technologies and extensive interpretation of multiple databases.   Critical Care Time devoted to patient care services described in this note is 35 Minutes.  Length of Stay: 11  Arvilla Meres MD 02/06/2015, 8:52 AM  Advanced Heart Failure Team Pager [phone] (M-F; 7a - 4p)  Please contact [facility] for night-coverage after hours (4p -7a ) and weekends on [email or web address]

## 2015-02-06 NOTE — Progress Notes (Addendum)
Wasted of morphine from bag into sink with nurse Charlena Cross as witness. Breanna Alvarez

## 2015-02-06 NOTE — Progress Notes (Signed)
After 2 nurses tried 2 times to place panda tube, all attempts failed.  Pt. Started bleeding out of both nares.  Bleeding stopped fairly quickly with minimal packing.  Will defer any more attempts until we confer with doc in A.M.  I will continue to monitor

## 2015-02-06 NOTE — Progress Notes (Addendum)
[address]       [place] [address]             [phone]        CARDIOTHORACIC SURGERY PROGRESS NOTE  R5 Days Post-Op Procedure(s) (LRB): CORONARY ARTERY BYPASS GRAFTING (CABG) X 4 UTILIZING THE LEFT INTERNAL MAMMARY ARTERY TO LAD, ENDOSCOPICALLY HARVESTED BILATERAL SAPHENEOUS VEIN GRAFTS TO DIAGONAL, OM AND PD. (N/A) TRANSESOPHAGEAL ECHOCARDIOGRAM (TEE) (N/A) PLACEMENT OF CENTRIMAG VENTRICULAR ASSIST DEVICE (N/A)   R1 Day Post-Op Procedure(s) (LRB): REMOVAL OF CENTRIMAG VENTRICULAR ASSIST DEVICE (N/A) CANNULATION FOR CARDIOPULMONARY BYPASS (N/A) TRANSESOPHAGEAL ECHOCARDIOGRAM (TEE) (N/A)  Subjective: Sedated on vent  Objective: Vital signs: BP Readings from Last 1 Encounters:  02/06/15 109/47   Pulse Readings from Last 1 Encounters:  02/06/15 115   Resp Readings from Last 1 Encounters:  02/06/15 18   Temp Readings from Last 1 Encounters:  02/06/15 100.4 F (38 C)     Hemodynamics: PAP: (25-50)/(11-32) 33/24 mmHg CVP:  [9 mmHg-21 mmHg] 14 mmHg CO:  [3.2 L/min-5.1 L/min] 5 L/min CI:  [2.3 L/min/m2-5 L/min/m2] 3.6 L/min/m2  Mixed venous co-ox 71%  Physical Exam:  Rhythm:   Sinus tach  Breath sounds: Coarse but clear  Heart sounds:  RRR  Incisions:  Dressing dry, intact  Abdomen:  Soft, non-distended  Extremities:  Warm, well-perfused  Chest tubes:  Low volume thin serosanguinous output, no air leak    Intake/Output from previous day: 09/30 0701 - 10/01 0700 In: 5994.9 [I.V.:2925.9; Blood:1459; NG/GT:210; IV Piggyback:1400] Out: 7305 [Urine:4595; Emesis/NG output:450; Blood:1500; Chest Tube:760] Intake/Output this shift: Total I/O In: -  Out: 215 [Urine:125; Chest Tube:90]  Lab Results:  CBC: Recent Labs  02/05/15 2200 02/05/15 2208 02/06/15 0421  WBC 12.0*  --  11.6*  HGB 9.9* 9.5* 10.2*  HCT 28.8* 28.0* 29.3*  PLT 54*  --  68*    BMET:  Recent Labs  02/05/15 0400  02/05/15 1939 02/05/15 2208 02/06/15 0421   NA 135  < > 136 135 136  K 3.8  < > 3.5 3.3* 4.6  CL 105  < > 104  --  104  CO2 22  --   --   --  19*  GLUCOSE 114*  < > 114* 119* 136*  BUN 22*  < > 23*  --  22*  CREATININE 1.26*  < > 1.10*  --  1.24*  CALCIUM 8.3*  --   --   --  8.5*  < > = values in this interval not displayed.   PT/INR:   Recent Labs  02/05/15 2200  LABPROT 18.2*  INR 1.51*    CBG (last 3)   Recent Labs  02/05/15 2324 02/06/15 0414 02/06/15 0746  GLUCAP 111* 138* 111*    ABG    Component Value Date/Time   PHART 7.415 02/06/2015 0419   PCO2ART 32.3* 02/06/2015 0419   PO2ART 134.0* 02/06/2015 0419   HCO3 20.5 02/06/2015 0419   TCO2 21 02/06/2015 0419   ACIDBASEDEF 3.0* 02/06/2015 0419   O2SAT 71.1 02/06/2015 0425    CXR: PORTABLE CHEST 1 VIEW  COMPARISON: February 05, 2015  FINDINGS: Endotracheal tube tip is 4.7 cm above the carina. Swan-Ganz catheter tip is in the main pulmonary outflow tract directed toward the right. There are bilateral chest tubes and a mediastinal drain. Temporary pacemaker wires are attached to the right heart. There is a left subclavian catheter with the tip at the cavoatrial junction. No pneumothorax. There is  mild atelectasis in the right mid lung and left base regions. There is no airspace consolidation. There is a minimal left effusion. Heart size is upper normal with pulmonary vascularity within normal limits. No adenopathy.  IMPRESSION: Tube and catheter positions as described without pneumothorax. Mild atelectatic change bilaterally. Minimal left effusion. No airspace consolidation. No change in cardiac silhouette.   Electronically Signed  By: Bretta Bang III M.D.  On: 02/06/2015 07:21   Assessment/Plan: S/P Procedure(s) (LRB): REMOVAL OF CENTRIMAG VENTRICULAR ASSIST DEVICE (N/A) CANNULATION FOR CARDIOPULMONARY BYPASS (N/A) TRANSESOPHAGEAL ECHOCARDIOGRAM (TEE) (N/A)  Stable overnight maintaining sinus tach w/ stable  hemodynamics and low PA pressures on Epi, Levophed and Milrinone drips Oxygenation stable w/ O2 sats 99% on 40% FiO2 Sternum left open w/ skin closed over the top w/ plans for return to OR on Monday for closure if patient remains stable Attempts to place feeding tube and start tube feeds unsuccessful and no bowel sounds on exam   Wean Epi drip slowly   Keep sedated on vent  Agree w/ lasix per Dr Gala Romney  Start TNA for nutritional support   Purcell Nails, MD 02/06/2015 9:45 AM

## 2015-02-06 NOTE — Progress Notes (Addendum)
T[facility] BRIEF SICU PROGRESS NOTE  1 Day Post-Op  S/P Procedure(s) (LRB): REMOVAL OF CENTRIMAG VENTRICULAR ASSIST DEVICE (N/A) CANNULATION FOR CARDIOPULMONARY BYPASS (N/A) TRANSESOPHAGEAL ECHOCARDIOGRAM (TEE) (N/A)   Stable day NSR w/ stable hemodynamics, now off Epi and Levophed @ 9 milrinone @ 0.3 UOP improved w/ lasix drip Labs okay w/ stable creatinine   Plan: Continue current plan  Purcell Nails, MD 02/06/2015 6:26 PM

## 2015-02-07 ENCOUNTER — Encounter (HOSPITAL_COMMUNITY): Payer: Self-pay | Admitting: Cardiothoracic Surgery

## 2015-02-07 ENCOUNTER — Inpatient Hospital Stay (HOSPITAL_COMMUNITY): Payer: Medicaid Other

## 2015-02-07 LAB — GLUCOSE, CAPILLARY
GLUCOSE-CAPILLARY: 144 mg/dL — AB (ref 65–99)
GLUCOSE-CAPILLARY: 177 mg/dL — AB (ref 65–99)
GLUCOSE-CAPILLARY: 129 mg/dL — AB (ref 65–99)
GLUCOSE-CAPILLARY: 169 mg/dL — AB (ref 65–99)
Glucose-Capillary: 102 mg/dL — ABNORMAL HIGH (ref 65–99)
Glucose-Capillary: 174 mg/dL — ABNORMAL HIGH (ref 65–99)

## 2015-02-07 LAB — COMPREHENSIVE METABOLIC PANEL
ALT: 31 U/L (ref 14–54)
AST: 43 U/L — ABNORMAL HIGH (ref 15–41)
Albumin: 2.6 g/dL — ABNORMAL LOW (ref 3.5–5.0)
Alkaline Phosphatase: 80 U/L (ref 38–126)
Anion gap: 9 (ref 5–15)
BUN: 22 mg/dL — ABNORMAL HIGH (ref 6–20)
CO2: 21 mmol/L — ABNORMAL LOW (ref 22–32)
Calcium: 7.8 mg/dL — ABNORMAL LOW (ref 8.9–10.3)
Chloride: 98 mmol/L — ABNORMAL LOW (ref 101–111)
Creatinine, Ser: 1.08 mg/dL — ABNORMAL HIGH (ref 0.44–1.00)
GFR calc Af Amer: >60 mL/min (ref >60)
GFR calc non Af Amer: 54 mL/min — ABNORMAL LOW (ref >60)
Glucose, Bld: 186 mg/dL — ABNORMAL HIGH (ref 65–99)
Potassium: 3.7 mmol/L (ref 3.5–5.1)
Sodium: 128 mmol/L — ABNORMAL LOW (ref 135–145)
Total Bilirubin: 2.4 mg/dL — ABNORMAL HIGH (ref 0.3–1.2)
Total Protein: 4.8 g/dL — ABNORMAL LOW (ref 6.5–8.1)

## 2015-02-07 LAB — DIFFERENTIAL
Basophils Absolute: 0 10*3/uL (ref 0.0–0.1)
Basophils Relative: 0 %
Eosinophils Absolute: 0.8 10*3/uL — ABNORMAL HIGH (ref 0.0–0.7)
Eosinophils Relative: 6 %
Lymphocytes Relative: 12 %
Lymphs Abs: 1.6 10*3/uL (ref 0.7–4.0)
Monocytes Absolute: 1.4 10*3/uL — ABNORMAL HIGH (ref 0.1–1.0)
Monocytes Relative: 10 %
Neutro Abs: 10 10*3/uL — ABNORMAL HIGH (ref 1.7–7.7)
Neutrophils Relative %: 72 %

## 2015-02-07 LAB — POCT I-STAT 3, ART BLOOD GAS (G3+)
ACID-BASE EXCESS: 2 mmol/L (ref 0.0–2.0)
ACID-BASE EXCESS: 1 mmol/L (ref 0.0–2.0)
Acid-base deficit: 2 mmol/L (ref 0.0–2.0)
Acid-base deficit: 6 mmol/L — ABNORMAL HIGH (ref 0.0–2.0)
BICARBONATE: 19.5 meq/L — AB (ref 20.0–24.0)
BICARBONATE: 23.6 meq/L (ref 20.0–24.0)
Bicarbonate: 26.1 mEq/L — ABNORMAL HIGH (ref 20.0–24.0)
Bicarbonate: 26.1 mEq/L — ABNORMAL HIGH (ref 20.0–24.0)
O2 SAT: 99 %
O2 Saturation: 100 %
O2 Saturation: 99 %
O2 Saturation: 99 %
PCO2 ART: 39.6 mmHg (ref 35.0–45.0)
PCO2 ART: 46.7 mmHg — AB (ref 35.0–45.0)
PH ART: 7.317 — AB (ref 7.350–7.450)
PH ART: 7.4 (ref 7.350–7.450)
PO2 ART: 428 mmHg — AB (ref 80.0–100.0)
Patient temperature: 38.1
Patient temperature: 38.2
Patient temperature: 37.9
TCO2: 21 mmol/L (ref 0–100)
TCO2: 25 mmol/L (ref 0–100)
TCO2: 27 mmol/L (ref 0–100)
TCO2: 27 mmol/L (ref 0–100)
pCO2 arterial: 40.4 mmHg (ref 35.0–45.0)
pCO2 arterial: 42.6 mmHg (ref 35.0–45.0)
pH, Arterial: 7.307 — ABNORMAL LOW (ref 7.350–7.450)
pH, Arterial: 7.42 (ref 7.350–7.450)
pO2, Arterial: 156 mmHg — ABNORMAL HIGH (ref 80.0–100.0)
pO2, Arterial: 135 mmHg — ABNORMAL HIGH (ref 80.0–100.0)
pO2, Arterial: 144 mmHg — ABNORMAL HIGH (ref 80.0–100.0)

## 2015-02-07 LAB — CULTURE, BLOOD (SINGLE): Culture: NO GROWTH

## 2015-02-07 LAB — CARBOXYHEMOGLOBIN
Carboxyhemoglobin: 1.2 % (ref 0.5–1.5)
Methemoglobin: 0.9 % (ref 0.0–1.5)
O2 Saturation: 70.6 %
Total hemoglobin: 16.4 g/dL — ABNORMAL HIGH (ref 12.0–16.0)

## 2015-02-07 LAB — TYPE AND SCREEN
ABO/RH(D): O POS
Antibody Screen: NEGATIVE
Unit division: 0
Unit division: 0
Unit division: 0
Unit division: 0
Unit division: 0
Unit division: 0

## 2015-02-07 LAB — TRIGLYCERIDES: Triglycerides: 132 mg/dL (ref <150)

## 2015-02-07 LAB — CBC
HCT: 28.4 % — ABNORMAL LOW (ref 36.0–46.0)
HEMOGLOBIN: 9.9 g/dL — AB (ref 12.0–15.0)
MCH: 30.2 pg (ref 26.0–34.0)
MCHC: 34.9 g/dL (ref 30.0–36.0)
MCV: 86.6 fL (ref 78.0–100.0)
Platelets: 105 10*3/uL — ABNORMAL LOW (ref 150–400)
RBC: 3.28 MIL/uL — AB (ref 3.87–5.11)
RDW: 16.4 % — ABNORMAL HIGH (ref 11.5–15.5)
WBC: 13.9 10*3/uL — ABNORMAL HIGH (ref 4.0–10.5)

## 2015-02-07 LAB — PHOSPHORUS: Phosphorus: 4.7 mg/dL — ABNORMAL HIGH (ref 2.5–4.6)

## 2015-02-07 LAB — CALCIUM, IONIZED: Calcium, Ionized, Serum: 4.6 mg/dL (ref 4.5–5.6)

## 2015-02-07 LAB — PREALBUMIN: Prealbumin: 6.5 mg/dL — ABNORMAL LOW (ref 18–38)

## 2015-02-07 LAB — MAGNESIUM: Magnesium: 1.9 mg/dL (ref 1.7–2.4)

## 2015-02-07 MED ORDER — INSULIN ASPART 100 UNIT/ML ~~LOC~~ SOLN
0.0000 [IU] | SUBCUTANEOUS | Status: DC
  Administered 2015-02-07: 6 [IU] via SUBCUTANEOUS
  Administered 2015-02-07: 4 [IU] via SUBCUTANEOUS
  Administered 2015-02-07 – 2015-02-08 (×2): 6 [IU] via SUBCUTANEOUS
  Administered 2015-02-08: 4 [IU] via SUBCUTANEOUS
  Administered 2015-02-08 (×2): 6 [IU] via SUBCUTANEOUS
  Administered 2015-02-08 – 2015-02-09 (×3): 4 [IU] via SUBCUTANEOUS
  Administered 2015-02-09: 6 [IU] via SUBCUTANEOUS
  Administered 2015-02-09: 4 [IU] via SUBCUTANEOUS
  Administered 2015-02-09 (×2): 6 [IU] via SUBCUTANEOUS
  Administered 2015-02-09: 4 [IU] via SUBCUTANEOUS
  Administered 2015-02-10: 6 [IU] via SUBCUTANEOUS
  Administered 2015-02-10: 4 [IU] via SUBCUTANEOUS
  Administered 2015-02-10: 6 [IU] via SUBCUTANEOUS
  Administered 2015-02-10: 4 [IU] via SUBCUTANEOUS
  Administered 2015-02-11: 6 [IU] via SUBCUTANEOUS
  Administered 2015-02-11 – 2015-02-12 (×5): 4 [IU] via SUBCUTANEOUS
  Administered 2015-02-12 (×2): 6 [IU] via SUBCUTANEOUS
  Administered 2015-02-12 – 2015-02-13 (×3): 4 [IU] via SUBCUTANEOUS
  Administered 2015-02-13: 6 [IU] via SUBCUTANEOUS
  Administered 2015-02-13 (×2): 4 [IU] via SUBCUTANEOUS
  Administered 2015-02-14: 6 [IU] via SUBCUTANEOUS
  Administered 2015-02-14 (×3): 4 [IU] via SUBCUTANEOUS
  Administered 2015-02-14: 6 [IU] via SUBCUTANEOUS
  Administered 2015-02-15 (×3): 4 [IU] via SUBCUTANEOUS
  Administered 2015-02-16 (×2): 2 [IU] via SUBCUTANEOUS
  Administered 2015-02-16 – 2015-02-17 (×4): 4 [IU] via SUBCUTANEOUS
  Administered 2015-02-17: 6 [IU] via SUBCUTANEOUS
  Administered 2015-02-17: 4 [IU] via SUBCUTANEOUS
  Administered 2015-02-17: 6 [IU] via SUBCUTANEOUS
  Administered 2015-02-17 – 2015-02-18 (×3): 4 [IU] via SUBCUTANEOUS
  Administered 2015-02-18 (×2): 6 [IU] via SUBCUTANEOUS
  Administered 2015-02-19 (×2): 4 [IU] via SUBCUTANEOUS
  Administered 2015-02-20: 6 [IU] via SUBCUTANEOUS
  Administered 2015-02-20: 4 [IU] via SUBCUTANEOUS
  Administered 2015-02-20: 6 [IU] via SUBCUTANEOUS
  Administered 2015-02-20 (×2): 4 [IU] via SUBCUTANEOUS
  Administered 2015-02-21: 6 [IU] via SUBCUTANEOUS
  Administered 2015-02-21 (×2): 4 [IU] via SUBCUTANEOUS
  Administered 2015-02-21: 6 [IU] via SUBCUTANEOUS
  Administered 2015-02-22 – 2015-02-25 (×9): 4 [IU] via SUBCUTANEOUS
  Administered 2015-02-26 (×2): 6 [IU] via SUBCUTANEOUS
  Administered 2015-02-26: 4 [IU] via SUBCUTANEOUS
  Administered 2015-02-27: 6 [IU] via SUBCUTANEOUS
  Administered 2015-02-27: 4 [IU] via SUBCUTANEOUS
  Administered 2015-02-27 (×2): 6 [IU] via SUBCUTANEOUS
  Administered 2015-02-27 – 2015-03-01 (×6): 4 [IU] via SUBCUTANEOUS
  Administered 2015-03-01: 8 [IU] via SUBCUTANEOUS
  Administered 2015-03-01 – 2015-03-02 (×2): 4 [IU] via SUBCUTANEOUS
  Administered 2015-03-02 (×3): 2 [IU] via SUBCUTANEOUS
  Administered 2015-03-02 – 2015-03-03 (×6): 4 [IU] via SUBCUTANEOUS
  Administered 2015-03-03 – 2015-03-04 (×2): 6 [IU] via SUBCUTANEOUS
  Administered 2015-03-04 – 2015-03-05 (×6): 4 [IU] via SUBCUTANEOUS
  Administered 2015-03-05 – 2015-03-06 (×3): 6 [IU] via SUBCUTANEOUS
  Administered 2015-03-06: 4 [IU] via SUBCUTANEOUS
  Administered 2015-03-06: 6 [IU] via SUBCUTANEOUS
  Administered 2015-03-06 – 2015-03-08 (×6): 4 [IU] via SUBCUTANEOUS
  Administered 2015-03-08: 6 [IU] via SUBCUTANEOUS
  Administered 2015-03-08 – 2015-03-09 (×4): 4 [IU] via SUBCUTANEOUS
  Administered 2015-03-09: 6 [IU] via SUBCUTANEOUS
  Administered 2015-03-09 (×2): 4 [IU] via SUBCUTANEOUS
  Administered 2015-03-10: 6 [IU] via SUBCUTANEOUS
  Administered 2015-03-10 – 2015-03-11 (×5): 4 [IU] via SUBCUTANEOUS
  Administered 2015-03-11 – 2015-03-12 (×2): 6 [IU] via SUBCUTANEOUS
  Administered 2015-03-12 – 2015-03-14 (×7): 4 [IU] via SUBCUTANEOUS
  Administered 2015-03-14: 6 [IU] via SUBCUTANEOUS
  Administered 2015-03-15: 4 [IU] via SUBCUTANEOUS

## 2015-02-07 MED ORDER — POTASSIUM CHLORIDE 10 MEQ/50ML IV SOLN
10.0000 meq | INTRAVENOUS | Status: AC
  Administered 2015-02-07 (×2): 10 meq via INTRAVENOUS

## 2015-02-07 MED ORDER — TRACE MINERALS CR-CU-MN-SE-ZN 10-1000-500-60 MCG/ML IV SOLN
INTRAVENOUS | Status: AC
  Administered 2015-02-07: 18:00:00 via INTRAVENOUS
  Filled 2015-02-07: qty 1440

## 2015-02-07 NOTE — Progress Notes (Addendum)
PARENTERAL NUTRITION CONSULT NOTE - Follow Up  Pharmacy Consult for TPN Indication: Prolonged ileus  Allergies  Allergen Reactions  . Atorvastatin Other (See Comments)    Gas, chest tightness  . Crestor [Rosuvastatin Calcium] Other (See Comments)    Muscle aches   Patient Measurements: Height: 5' (152.4 cm) Weight: 121 lb 4.1 oz (55 kg) IBW/kg (Calculated) : 45.5  Usual Weight: ~100-106 pounds (46-48 kg)  Vital Signs: Temp: 99.3 F (37.4 C) (10/02 0700) BP: 100/56 mmHg (10/02 0700) Pulse Rate: 114 (10/02 0700) Intake/Output from previous day: 10/01 0701 - 10/02 0700 In: 2658.9 [I.V.:1803.1; IV Piggyback:300; TPN:555.8] Out: 2855 [Urine:2355; Chest Tube:500] Intake/Output from this shift:   Labs:  Recent Labs  02/05/15 0400  02/05/15 1110 02/05/15 1606  02/05/15 2200  02/06/15 0421 02/06/15 1638 02/06/15 1647 02/07/15 0309  WBC 12.4*  < >  --   --   --  12.0*  --  11.6* 13.6*  --  13.9*  HGB 9.6*  < >  --   --   < > 9.9*  < > 10.2* 9.4* 9.9* 9.9*  HCT 27.6*  < >  --   --   < > 28.8*  < > 29.3* 27.6* 29.0* 28.4*  PLT 85*  < >  --   --   < > 54*  --  68* 79*  --  105*  APTT 71*  --  68* 72*  --  48*  --   --   --   --   --   INR 1.60*  --   --   --   --  1.51*  --   --   --   --   --   < > = values in this interval not displayed.  Recent Labs  02/05/15 0400  02/06/15 0421 02/06/15 1200 02/06/15 1638 02/06/15 1647 02/07/15 0309  NA 135  < > 136  --   --  132* 128*  K 3.8  < > 4.6  --   --  3.6 3.7  CL 105  < > 104  --   --  98* 98*  CO2 22  --  19*  --   --   --  21*  GLUCOSE 114*  < > 136*  --   --  108* 186*  BUN 22*  < > 22*  --   --  21* 22*  CREATININE 1.26*  < > 1.24*  --  1.13* 1.20* 1.08*  CALCIUM 8.3*  --  8.5*  --   --   --  7.8*  MG 2.0  --  3.0*  --  2.2  --  1.9  PHOS  --   --   --   --   --   --  4.7*  PROT 5.2*  --  5.0*  --   --   --  4.8*  ALBUMIN 3.6  --  3.1*  --   --   --  2.6*  AST 57*  --  65*  --   --   --  43*  ALT 23  --  27   --   --   --  31  ALKPHOS 55  --  62  --   --   --  80  BILITOT 1.6*  --  2.6*  --   --   --  2.4*  PREALBUMIN  --   --   --   --   --   --  6.5*  TRIG  --   --   --  148  --   --  132  < > = values in this interval not displayed. Estimated Creatinine Clearance: 42 mL/min (by C-G formula based on Cr of 1.08).   Recent Labs  02/06/15 1619 02/06/15 1959 02/07/15 0015  GLUCAP 102* 150* 174*   Medical History: Past Medical History  Diagnosis Date  . Hyperlipidemia   . Acute systolic CHF (congestive heart failure), NYHA class 3 07/10/2014  . Hypertension    Medications:  Infusions:  . Marland KitchenTPN (CLINIMIX-E) Adult 42 mL/hr at 02/07/15 0700  . sodium chloride 20 mL/hr at 02/07/15 0700  . sodium chloride    . sodium chloride 20 mL/hr at 02/07/15 0700  . epinephrine Stopped (02/06/15 1300)  . furosemide (LASIX) infusion 4 mg/hr (02/07/15 0700)  . milrinone 0.3 mcg/kg/min (02/07/15 0700)  . morphine 4 mg/hr (02/07/15 0700)  . norepinephrine (LEVOPHED) Adult infusion 11.013 mcg/min (02/07/15 0700)  . propofol (DIPRIVAN) infusion 20.681 mcg/kg/min (02/07/15 0700)  Admit:  64 yo female with cardiac problems and recent CABG on 9/26 and Centrimag removal on 9/30 with an open sternum and plans for return to OR on Monday.  She was in her usual state of health prior to admit with a weight range of 100-105 pounds.  She has some fluid gain and her weight currently is ~ 133 pounds.  Surgeries/Procedures: S/p CABG x4, TEE, Removal of CVAD  Endo:  Insulin Requirements in the past 24 hours:  Received 2 units for CBG of 150 after TPN started yesterday, this morning has had 8 units for CBG of 174 and serum glucose was 186  Current Nutrition:  Currently NPO - attempts made to insert NG tube - failed, no bowel sounds present.  Albumin and (2.6) prealbumin are low (6.5) as well as total protein (4.8) likely both stress response and poor nutritional status.  Triglycerides are 132  Nutritional Goals:   1320 kCal, 100-110 grams of protein per day per initial assessment, new goals on 10/1 for 1270 kcal and > 90 gm of protein GI:  Post-op ileus, no bowel sounds, no stools, has 452ml Emesis/NG output, 734ml chest tube out. PPI via NG tube Lytes:  Low electrolytes (Sod.,/K+, Mag)-ongoing diuresis and supplementation.  Corrected calcium = 9.2 which is WNL. Slightly elevated Phos but Ca/Phos = 36.6 (< 50 which is desired) Renal:  Creat. 1.0 (ranging 1.1-1.3) excellent diuresis (3.56ml/kg/hr) for past 2 days added furosemide drip at $Remov'4mg'zuLVGR$ /hr this morning   IV fluids:   NS at 41ml/hr Pulm:  Remains intubated and sedated with propofol (rate decreased yesterday 45mcg/kg/min>>20mcg/kg/min this AM - 174kcal) Cards:  S/p CABG x 4 - placement of CAVD (and removed), Tachy this morning and tenuous BP - Levo and Milrinone infusing Hepatobil:  AST/ALT normal, Alk Phos WNL.  T. Bili slightly elevated 2.4 HEME: Post-op anemia and thrombocytopenia - stable (9.9/28.4, 105K) Neuro:  Sedated - on propofol ID:   Tmax to 100.9, currently 99.3 - Cefuroxime resumed last night - elevated WBC, wound and blood cultures NGTD Best Practices:  SCD's TPN Access:  PICC - placed 9/28 TPN start date: 10/1  Plan:  -  Will increase rate slowly to ensure adequate glycemic response to TPN.  New rate this evening will be 60ml/hr for Clinimix with electrolytes.  This will provide 72g of protein and ~ 1000kcal, adding the propofol calories will give ~ 1200kcal.  Will plan to increase to a goal of 80 ml/hr to  provide ~ 1360 kcal and 96 gm of protein.  Propofol provides 1.1kcal/ml ~ 174kcal (at current rate of 21mcg/kg/min).  We may need to swap to alternative sedative to provide amount of protein and maintain kcal at estimated needs.   -  No Lipids for now while on propofol -  Adjust sliding scale to achieve CBG's < 150 -  Add Pepcid to TPN to minimize fluid -  F/u AM labs  Rober Minion, PharmD., MS Clinical Pharmacist Pager:   [phone] Thank you for allowing pharmacy to be part of this patients care team. 02/07/2015,7:33 AM

## 2015-02-07 NOTE — Progress Notes (Signed)
RT note- Did not meet criteria for weaning today, not following command, continue to monitor.

## 2015-02-07 NOTE — Progress Notes (Signed)
[facility] BRIEF SICU PROGRESS NOTE  R6 Days Post-Op Procedure(s) (LRB): CORONARY ARTERY BYPASS GRAFTING (CABG) X 4 UTILIZING THE LEFT INTERNAL MAMMARY ARTERY TO LAD, ENDOSCOPICALLY HARVESTED BILATERAL SAPHENEOUS VEIN GRAFTS TO DIAGONAL, OM AND PD. (N/A) TRANSESOPHAGEAL ECHOCARDIOGRAM (TEE) (N/A) PLACEMENT OF CENTRIMAG VENTRICULAR ASSIST DEVICE (N/A)  2 Days Post-Op  S/P Procedure(s) (LRB): REMOVAL OF CENTRIMAG VENTRICULAR ASSIST DEVICE (N/A) CANNULATION FOR CARDIOPULMONARY BYPASS (N/A) TRANSESOPHAGEAL ECHOCARDIOGRAM (TEE) (N/A)   Stable day Sinus tach w/ stable hemodynamics, levophed weaned to 2 mcg/min Diuresing well  Plan: Continue current plan  Purcell Nails, MD 02/07/2015 6:06 PM

## 2015-02-07 NOTE — Progress Notes (Signed)
 of Morphine Sulfate from and infusion bag was wasted in the med room sink with Michail Jewels, RN as witness.  Breanna Alvarez

## 2015-02-07 NOTE — Progress Notes (Addendum)
[address]       [place] [address]             [phone]        CARDIOTHORACIC SURGERY PROGRESS NOTE  R6 Days Post-Op Procedure(s) (LRB): CORONARY ARTERY BYPASS GRAFTING (CABG) X 4 UTILIZING THE LEFT INTERNAL MAMMARY ARTERY TO LAD, ENDOSCOPICALLY HARVESTED BILATERAL SAPHENEOUS VEIN GRAFTS TO DIAGONAL, OM AND PD. (N/A) TRANSESOPHAGEAL ECHOCARDIOGRAM (TEE) (N/A) PLACEMENT OF CENTRIMAG VENTRICULAR ASSIST DEVICE (N/A)   R2 Days Post-Op Procedure(s) (LRB): REMOVAL OF CENTRIMAG VENTRICULAR ASSIST DEVICE (N/A) CANNULATION FOR CARDIOPULMONARY BYPASS (N/A) TRANSESOPHAGEAL ECHOCARDIOGRAM (TEE) (N/A)  Subjective: Sedated on vent.  Opens eyes but not following commands  Objective: Vital signs: BP Readings from Last 1 Encounters:  02/07/15 100/56   Pulse Readings from Last 1 Encounters:  02/07/15 114   Resp Readings from Last 1 Encounters:  02/07/15 14   Temp Readings from Last 1 Encounters:  02/07/15 99.3 F (37.4 C)     Hemodynamics: PAP: (24-325)/(15-321) 28/16 mmHg CO:  [4.2 L/min-5.2 L/min] 4.5 L/min CI:  [3 L/min/m2-3.7 L/min/m2] 3.2 L/min/m2  Mixed venous co-ox 70.6%  Physical Exam:  Rhythm:   sinus  Breath sounds: Coarse but clear  Heart sounds:  RRR  Incisions:  Dressing dry, intact  Abdomen:  Soft, non-distended, quiet  Extremities:  Warm, well-perfused  Chest tubes:  Low volume thin serosanguinous output, no air leak    Intake/Output from previous day: 10/01 0701 - 10/02 0700 In: 2658.9 [I.V.:1803.1; IV Piggyback:300; TPN:555.8] Out: 3055 [Urine:2555; Chest Tube:500] Intake/Output this shift: Total I/O In: -  Out: 465 [Urine:425; Chest Tube:40]  Lab Results:  CBC: Recent Labs  02/06/15 1638 02/06/15 1647 02/07/15 0309  WBC 13.6*  --  13.9*  HGB 9.4* 9.9* 9.9*  HCT 27.6* 29.0* 28.4*  PLT 79*  --  105*    BMET:  Recent Labs  02/06/15 0421  02/06/15 1647 02/07/15 0309  NA 136  --  132* 128*  K 4.6  --  3.6  3.7  CL 104  --  98* 98*  CO2 19*  --   --  21*  GLUCOSE 136*  --  108* 186*  BUN 22*  --  21* 22*  CREATININE 1.24*  < > 1.20* 1.08*  CALCIUM 8.5*  --   --  7.8*  < > = values in this interval not displayed.   PT/INR:   Recent Labs  02/05/15 2200  LABPROT 18.2*  INR 1.51*    CBG (last 3)   Recent Labs  02/06/15 1959 02/07/15 0015 02/07/15 0832  GLUCAP 150* 174* 144*    ABG    Component Value Date/Time   PHART 7.307* 02/07/2015 0245   PCO2ART 39.6 02/07/2015 0245   PO2ART 156.0* 02/07/2015 0245   HCO3 19.5* 02/07/2015 0245   TCO2 21 02/07/2015 0245   ACIDBASEDEF 6.0* 02/07/2015 0245   O2SAT 70.6 02/07/2015 0500    CXR: PORTABLE CHEST 1 VIEW  COMPARISON: February 06, 2015  FINDINGS: Endotracheal tube tip is 5.5 cm above the carina. Swan-Ganz catheter tip is in the main pulmonary outflow tract directed toward the right. Left central catheter tip may be in the azygos vein. Chest tubes and mediastinal drains remain without change. Temporary pacemaker wires are attached to the right heart. No pneumothorax.  There is increase in airspace consolidation in the left base and right mid lung regions. There is a small left effusion. Heart is upper normal in size with pulmonary  vascularity within normal limits. No adenopathy.  IMPRESSION: Tube and catheter positions as described. The left subclavian catheter tip may be in the azygos vein. It has withdrawn several cm compared to 1 day prior.  Increasing patchy airspace disease in the right mid lung and left base. Question congestive heart failure versus superimposed pneumonia. Both entities may exist concurrently.   Electronically Signed  By: Bretta Bang III M.D.  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

## 2015-02-07 NOTE — Progress Notes (Signed)
Advanced Heart Failure Rounding Note   Subjective:    S/p CABG with Centrimag placement 02/01/15  Centrimag explanted 9/30. Chest remains open due to swelling.   Remains intubated. Awake at times but not following commands.  Off epi, milrinone 0.3, and levophed 12  Apparently very sensitive to lasix. I/Os slightly negative but weight down 12 pounds??  Co-ox 71% PA 33/24  Thermo 5.0/3.6  Platelets 68k.-> 105k  HIT negative by SRA.     Objective:   Weight Range:  Vital Signs:   Temp:  [96.8 F (36 C)-100.9 F (38.3 C)] 100.2 F (37.9 C) (10/02 1000) Pulse Rate:  [40-130] 130 (10/02 0900) Resp:  [14-20] 17 (10/02 1000) BP: (91-130)/(42-107) 106/55 mmHg (10/02 1000) SpO2:  [89 %-100 %] 89 % (10/02 0900) FiO2 (%):  [40 %] 40 % (10/02 0825) Weight:  [55 kg (121 lb 4.1 oz)] 55 kg (121 lb 4.1 oz) (10/02 0700) Last BM Date: 01/31/15  Weight change: Filed Weights   02/05/15 0545 02/06/15 0500 02/07/15 0700  Weight: 56.2 kg (123 lb 14.4 oz) 60.6 kg (133 lb 9.6 oz) 55 kg (121 lb 4.1 oz)    Intake/Output:   Intake/Output Summary (Last 24 hours) at 02/07/15 1102 Last data filed at 02/07/15 1000  Gross per 24 hour  Intake 2867.5 ml  Output   3100 ml  Net -232.5 ml     Physical Exam: General:  Intubated opens eyes but doesn't follow comamnds HEENT: normal x ETT Neck: supple. RIJ swan Cor: Chest open with dressing. Tachy regular Lungs: clear Abdomen: soft, nontender, + distended. Hypoactive bowel sounds. Extremities: no cyanosis, clubbing, rash, 2+ edema Neuro: intubated/sedated  Telemetry: Sinus tach 110-125  Labs: Basic Metabolic Panel:  Recent Labs Lab 02/03/15 0405  02/04/15 0410  02/05/15 0400  02/05/15 1859 02/05/15 1939 02/05/15 2208 02/06/15 0421 02/06/15 1638 02/06/15 1647 02/07/15 0309  NA 136  < > 137  < > 135  < > 134* 136 135 136  --  132* 128*  K 3.8  < > 3.9  < > 3.8  < > 4.5 3.5 3.3* 4.6  --  3.6 3.7  CL 105  < > 108  < > 105  < > 105  104  --  104  --  98* 98*  CO2 20*  --  21*  --  22  --   --   --   --  19*  --   --  21*  GLUCOSE 154*  < > 109*  < > 114*  < > 132* 114* 119* 136*  --  108* 186*  BUN 16  < > 19  < > 22*  < > 21* 23*  --  22*  --  21* 22*  CREATININE 1.35*  < > 1.21*  < > 1.26*  < > 1.00 1.10*  --  1.24* 1.13* 1.20* 1.08*  CALCIUM 8.1*  --  8.1*  --  8.3*  --   --   --   --  8.5*  --   --  7.8*  MG  --   --  1.7  --  2.0  --   --   --   --  3.0* 2.2  --  1.9  PHOS  --   --   --   --   --   --   --   --   --   --   --   --  4.7*  < > =  values in this interval not displayed.  Liver Function Tests:  Recent Labs Lab 02/04/15 0410 02/05/15 0400 02/06/15 0421 02/07/15 0309  AST 52* 57* 65* 43*  ALT ALKPHOS 51 55 62 80  BILITOT 1.7* 1.6* 2.6* 2.4*  PROT 4.7* 5.2* 5.0* 4.8*  ALBUMIN 3.3* 3.6 3.1* 2.6*   No results for input(s): LIPASE, AMYLASE in the last 168 hours. No results for input(s): AMMONIA in the last 168 hours.  CBC:  Recent Labs Lab 02/05/15 1100  02/05/15 2009 02/05/15 2200 02/05/15 2208 02/06/15 0421 02/06/15 1638 02/06/15 1647 02/07/15 0309  WBC 10.9*  --   --  12.0*  --  11.6* 13.6*  --  13.9*  NEUTROABS  --   --   --   --   --   --   --   --  10.0*  HGB 9.4*  < > 8.3* 9.9* 9.5* 10.2* 9.4* 9.9* 9.9*  HCT 28.0*  < > 23.7* 28.8* 28.0* 29.3* 27.6* 29.0* 28.4*  MCV 85.1  --   --  85.7  --  85.7 85.2  --  86.6  PLT 77*  --  40* 54*  --  68* 79*  --  105*  < > = values in this interval not displayed.  Cardiac Enzymes: No results for input(s): CKTOTAL, CKMB, CKMBINDEX, TROPONINI in the last 168 hours.  BNP: BNP (last 3 results)  Recent Labs  07/10/14 1315  BNP 1059.1*    ProBNP (last 3 results) No results for input(s): PROBNP in the last 8760 hours.    Other results:  Imaging: Dg Chest Port 1 View  02/07/2015   CLINICAL DATA:  Hypoxia and atelectatic change  EXAM: PORTABLE CHEST 1 VIEW  COMPARISON:  February 06, 2015  FINDINGS: Endotracheal tube tip is  5.5 cm above the carina. Swan-Ganz catheter tip is in the main pulmonary outflow tract directed toward the right. Left central catheter tip may be in the azygos vein. Chest tubes and mediastinal drains remain without change. Temporary pacemaker wires are attached to the right heart. No pneumothorax.  There is increase in airspace consolidation in the left base and right mid lung regions. There is a small left effusion. Heart is upper normal in size with pulmonary vascularity within normal limits. No adenopathy.  IMPRESSION: Tube and catheter positions as described. The left subclavian catheter tip may be in the azygos vein. It has withdrawn several cm compared to 1 day prior.  Increasing patchy airspace disease in the right mid lung and left base. Question congestive heart failure versus superimposed pneumonia. Both entities may exist concurrently.   Electronically Signed   By: Bretta Bang III M.D.   On: 02/07/2015 07:28   Dg Chest Port 1 View  02/06/2015   CLINICAL DATA:  Shortness of Breath/hypoxia  EXAM: PORTABLE CHEST 1 VIEW  COMPARISON:  February 05, 2015  FINDINGS: Endotracheal tube tip is 4.7 cm above the carina. Swan-Ganz catheter tip is in the main pulmonary outflow tract directed toward the right. There are bilateral chest tubes and a mediastinal drain. Temporary pacemaker wires are attached to the right heart. There is a left subclavian catheter with the tip at the cavoatrial junction. No pneumothorax. There is mild atelectasis in the right mid lung and left base regions. There is no airspace consolidation. There is a minimal left effusion. Heart size is upper normal with pulmonary vascularity within normal limits. No adenopathy.  IMPRESSION: Tube and catheter positions as described without  pneumothorax. Mild atelectatic change bilaterally. Minimal left effusion. No airspace consolidation. No change in cardiac silhouette.   Electronically Signed   By: Bretta Bang III M.D.   On: 02/06/2015  07:21   Dg Chest Port 1 View  02/05/2015   CLINICAL DATA:  Acute onset of shortness of breath. Initial encounter.  EXAM: PORTABLE CHEST 1 VIEW  COMPARISON:  Chest radiograph performed earlier today at 6:13 a.m.  FINDINGS: The patient's endotracheal tube is seen ending 5 cm above the carina. An enteric tube is noted extending below the diaphragm. Bilateral chest tubes and mediastinal drains are seen. A right IJ Swan-Ganz catheter is noted ending at the main pulmonary outflow tract. A left PICC is noted ending about the distal SVC.  The lungs are well-aerated. Mild bilateral atelectasis is noted. There is no evidence of pleural effusion or pneumothorax.  The cardiomediastinal silhouette is borderline enlarged. Postoperative change is noted along the midline chest wall, with changes of CABG. No acute osseous abnormalities are seen.  IMPRESSION: 1. Endotracheal tube seen ending 5 cm above the carina. Tubes and lines as described above. 2. Borderline cardiomegaly. 3. Mild bilateral atelectasis noted.   Electronically Signed   By: Roanna Raider M.D.   On: 02/05/2015 23:00     Medications:     Scheduled Medications: . sodium chloride   Intravenous Once  . antiseptic oral rinse  7 mL Mouth Rinse q12n4p  . aspirin EC  325 mg Oral Daily   Or  . aspirin  324 mg Per Tube Daily  . cefUROXime (ZINACEF)  IV  1.5 g Intravenous Q12H  . chlorhexidine  15 mL Mouth Rinse BID  . insulin aspart  0-24 Units Subcutaneous 6 times per day  . metoCLOPramide (REGLAN) injection  10 mg Intravenous 4 times per day  . mupirocin ointment  1 application Topical BID  . pantoprazole sodium  40 mg Per Tube Daily  . sodium chloride  10-40 mL Intracatheter Q12H  . sodium chloride  3 mL Intravenous Q12H  . sodium chloride  3 mL Intravenous Q12H    Infusions: . Marland KitchenTPN (CLINIMIX-E) Adult 42 mL/hr at 02/07/15 1000  . Marland KitchenTPN (CLINIMIX-E) Adult    . sodium chloride 20 mL/hr at 02/07/15 1000  . sodium chloride    . sodium chloride  20 mL/hr at 02/07/15 1000  . epinephrine Stopped (02/06/15 1300)  . furosemide (LASIX) infusion 4 mg/hr (02/07/15 1000)  . milrinone 0.3 mcg/kg/min (02/07/15 0700)  . morphine 2 mg/hr (02/07/15 1000)  . norepinephrine (LEVOPHED) Adult infusion 12 mcg/min (02/07/15 1000)  . propofol (DIPRIVAN) infusion 15 mcg/kg/min (02/07/15 1000)    PRN Medications: sodium chloride, HYDROmorphone (DILAUDID) injection, metoprolol, midazolam, midazolam, ondansetron (ZOFRAN) IV, sodium chloride, sodium chloride, sodium chloride   Assessment:   1. Cardiogenic shock   --s/p CABG with centrimag placement 02/01/15. Centrimag removed 9/30 2. CAD s/p CABG 3. 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 Acute respiratory failure 5. Acute blood loss anemia 6. Thrombocytopenia: HIT negative by SRA 7. Hyponatremia  Plan/Discussion:    Very tenuous but tolerating removal of Centrimag. Hemodynamics look good. Needs diuresis. Increase lasix to /hr.  Wean levophed as tolerated. Platelets improving.   The patient is critically ill with multiple organ systems failure and requires high complexity decision making for assessment and support, frequent evaluation and titration of therapies, application of advanced monitoring technologies and extensive interpretation of multiple databases.   Critical Care Time devoted to patient care services described in this note is 35 Minutes.  Length of Stay: 12  Arvilla Meres MD 02/07/2015, 11:02 AM  Advanced Heart Failure Team Pager [phone] (M-F; 7a - 4p)  Please contact [facility] for night-coverage after hours (4p -7a ) and weekends on [email or web address]

## 2015-02-08 ENCOUNTER — Inpatient Hospital Stay (HOSPITAL_COMMUNITY): Payer: Medicaid Other | Admitting: Certified Registered Nurse Anesthetist

## 2015-02-08 ENCOUNTER — Encounter (HOSPITAL_COMMUNITY)
Admission: AD | Disposition: A | Discharge status: Skilled Nursing Facility | Payer: Self-pay | Source: Ambulatory Visit | Attending: Cardiothoracic Surgery

## 2015-02-08 ENCOUNTER — Inpatient Hospital Stay (HOSPITAL_COMMUNITY): Payer: Medicaid Other

## 2015-02-08 DIAGNOSIS — I25110 Atherosclerotic heart disease of native coronary artery with unstable angina pectoris: Secondary | ICD-10-CM

## 2015-02-08 HISTORY — PX: STERNAL CLOSURE: SHX6203

## 2015-02-08 HISTORY — PX: TEE WITHOUT CARDIOVERSION: SHX5443

## 2015-02-08 LAB — GLUCOSE, CAPILLARY
GLUCOSE-CAPILLARY: 114 mg/dL — AB (ref 65–99)
GLUCOSE-CAPILLARY: 160 mg/dL — AB (ref 65–99)
GLUCOSE-CAPILLARY: 176 mg/dL — AB (ref 65–99)
Glucose-Capillary: 147 mg/dL — ABNORMAL HIGH (ref 65–99)
Glucose-Capillary: 168 mg/dL — ABNORMAL HIGH (ref 65–99)
Glucose-Capillary: 182 mg/dL — ABNORMAL HIGH (ref 65–99)

## 2015-02-08 LAB — DIFFERENTIAL
Basophils Absolute: 0 10*3/uL (ref 0.0–0.1)
Basophils Relative: 0 %
Eosinophils Absolute: 0.5 10*3/uL (ref 0.0–0.7)
Eosinophils Relative: 5 %
Lymphocytes Relative: 15 %
Lymphs Abs: 1.5 10*3/uL (ref 0.7–4.0)
Monocytes Absolute: 1.2 10*3/uL — ABNORMAL HIGH (ref 0.1–1.0)
Monocytes Relative: 12 %
Neutro Abs: 6.6 10*3/uL (ref 1.7–7.7)
Neutrophils Relative %: 68 %

## 2015-02-08 LAB — CBC
HCT: 27.3 % — ABNORMAL LOW (ref 36.0–46.0)
HCT: 27 % — ABNORMAL LOW (ref 36.0–46.0)
Hemoglobin: 9.2 g/dL — ABNORMAL LOW (ref 12.0–15.0)
Hemoglobin: 9.1 g/dL — ABNORMAL LOW (ref 12.0–15.0)
MCH: 29.3 pg (ref 26.0–34.0)
MCH: 29.1 pg (ref 26.0–34.0)
MCHC: 33.7 g/dL (ref 30.0–36.0)
MCHC: 33.7 g/dL (ref 30.0–36.0)
MCV: 86.9 fL (ref 78.0–100.0)
MCV: 86.3 fL (ref 78.0–100.0)
Platelets: 123 10*3/uL — ABNORMAL LOW (ref 150–400)
Platelets: 136 10*3/uL — ABNORMAL LOW (ref 150–400)
RBC: 3.14 MIL/uL — ABNORMAL LOW (ref 3.87–5.11)
RBC: 3.13 MIL/uL — ABNORMAL LOW (ref 3.87–5.11)
RDW: 15.8 % — ABNORMAL HIGH (ref 11.5–15.5)
RDW: 15.5 % (ref 11.5–15.5)
WBC: 9.8 10*3/uL (ref 4.0–10.5)
WBC: 9.6 10*3/uL (ref 4.0–10.5)

## 2015-02-08 LAB — BLOOD GAS, ARTERIAL
Acid-Base Excess: 4 mmol/L — ABNORMAL HIGH (ref 0.0–2.0)
BICARBONATE: 27.4 meq/L — AB (ref 20.0–24.0)
Drawn by: 437071
FIO2: 0.4
MECHVT: 500 mL
O2 Saturation: 99.4 %
PATIENT TEMPERATURE: 100
PEEP: 5 cmH2O
PO2 ART: 161 mmHg — AB (ref 80.0–100.0)
RATE: 14 resp/min
TCO2: 28.6 mmol/L (ref 0–100)
pCO2 arterial: 38.7 mmHg (ref 35.0–45.0)
pH, Arterial: 7.468 — ABNORMAL HIGH (ref 7.350–7.450)

## 2015-02-08 LAB — POCT I-STAT 3, ART BLOOD GAS (G3+)
Acid-Base Excess: 9 mmol/L — ABNORMAL HIGH (ref 0.0–2.0)
Acid-Base Excess: 3 mmol/L — ABNORMAL HIGH (ref 0.0–2.0)
BICARBONATE: 33.4 meq/L — AB (ref 20.0–24.0)
Bicarbonate: 27.9 mEq/L — ABNORMAL HIGH (ref 20.0–24.0)
O2 Saturation: 100 %
O2 Saturation: 98 %
PCO2 ART: 48.1 mmHg — AB (ref 35.0–45.0)
PCO2 ART: 42.9 mmHg (ref 35.0–45.0)
PO2 ART: 110 mmHg — AB (ref 80.0–100.0)
Patient temperature: 37.8
Patient temperature: 37.3
TCO2: 35 mmol/L (ref 0–100)
TCO2: 29 mmol/L (ref 0–100)
pH, Arterial: 7.452 — ABNORMAL HIGH (ref 7.350–7.450)
pH, Arterial: 7.422 (ref 7.350–7.450)
pO2, Arterial: 182 mmHg — ABNORMAL HIGH (ref 80.0–100.0)

## 2015-02-08 LAB — COMPREHENSIVE METABOLIC PANEL
ALT: 33 U/L (ref 14–54)
AST: 44 U/L — ABNORMAL HIGH (ref 15–41)
Albumin: 2.3 g/dL — ABNORMAL LOW (ref 3.5–5.0)
Alkaline Phosphatase: 94 U/L (ref 38–126)
Anion gap: 9 (ref 5–15)
BUN: 24 mg/dL — ABNORMAL HIGH (ref 6–20)
CO2: 28 mmol/L (ref 22–32)
Calcium: 7.9 mg/dL — ABNORMAL LOW (ref 8.9–10.3)
Chloride: 92 mmol/L — ABNORMAL LOW (ref 101–111)
Creatinine, Ser: 0.88 mg/dL (ref 0.44–1.00)
GFR calc Af Amer: >60 mL/min (ref >60)
GFR calc non Af Amer: >60 mL/min (ref >60)
Glucose, Bld: 144 mg/dL — ABNORMAL HIGH (ref 65–99)
Potassium: 3.3 mmol/L — ABNORMAL LOW (ref 3.5–5.1)
Sodium: 129 mmol/L — ABNORMAL LOW (ref 135–145)
Total Bilirubin: 2.6 mg/dL — ABNORMAL HIGH (ref 0.3–1.2)
Total Protein: 5.4 g/dL — ABNORMAL LOW (ref 6.5–8.1)

## 2015-02-08 LAB — WOUND CULTURE
Culture: NO GROWTH
Gram Stain: NONE SEEN

## 2015-02-08 LAB — BASIC METABOLIC PANEL
Anion gap: 14 (ref 5–15)
BUN: 24 mg/dL — ABNORMAL HIGH (ref 6–20)
CO2: 22 mmol/L (ref 22–32)
Calcium: 7.9 mg/dL — ABNORMAL LOW (ref 8.9–10.3)
Chloride: 90 mmol/L — ABNORMAL LOW (ref 101–111)
Creatinine, Ser: 1.04 mg/dL — ABNORMAL HIGH (ref 0.44–1.00)
GFR calc Af Amer: >60 mL/min (ref >60)
GFR calc non Af Amer: 56 mL/min — ABNORMAL LOW (ref >60)
Glucose, Bld: 193 mg/dL — ABNORMAL HIGH (ref 65–99)
Potassium: 3 mmol/L — ABNORMAL LOW (ref 3.5–5.1)
Sodium: 126 mmol/L — ABNORMAL LOW (ref 135–145)

## 2015-02-08 LAB — PHOSPHORUS: Phosphorus: 4 mg/dL (ref 2.5–4.6)

## 2015-02-08 LAB — TRIGLYCERIDES: Triglycerides: 131 mg/dL (ref <150)

## 2015-02-08 LAB — PREPARE RBC (CROSSMATCH)

## 2015-02-08 LAB — CALCIUM, IONIZED: Calcium, Ionized, Serum: 4.5 mg/dL (ref 4.5–5.6)

## 2015-02-08 LAB — PREALBUMIN: Prealbumin: 6.6 mg/dL — ABNORMAL LOW (ref 18–38)

## 2015-02-08 LAB — MAGNESIUM: Magnesium: 1.6 mg/dL — ABNORMAL LOW (ref 1.7–2.4)

## 2015-02-08 SURGERY — CLOSURE, STERNUM
Anesthesia: General | Site: Chest

## 2015-02-08 MED ORDER — VANCOMYCIN HCL 1000 MG IV SOLR
INTRAVENOUS | Status: AC
  Filled 2015-02-08: qty 1000

## 2015-02-08 MED ORDER — MORPHINE SULFATE (PF) 4 MG/ML IV SOLN
4.0000 mg | INTRAVENOUS | Status: DC | PRN
  Administered 2015-02-09 – 2015-02-10 (×6): 4 mg via INTRAVENOUS
  Filled 2015-02-08 (×6): qty 1

## 2015-02-08 MED ORDER — ROCURONIUM BROMIDE 100 MG/10ML IV SOLN
INTRAVENOUS | Status: DC | PRN
  Administered 2015-02-08: 50 mg via INTRAVENOUS

## 2015-02-08 MED ORDER — ACETAMINOPHEN 10 MG/ML IV SOLN
1000.0000 mg | Freq: Four times a day (QID) | INTRAVENOUS | Status: AC
  Administered 2015-02-08: 1000 mg via INTRAVENOUS
  Filled 2015-02-08: qty 100

## 2015-02-08 MED ORDER — DEXTROSE 5 % IV SOLN
1.5000 g | Freq: Two times a day (BID) | INTRAVENOUS | Status: AC
  Administered 2015-02-08 – 2015-02-09 (×4): 1.5 g via INTRAVENOUS
  Filled 2015-02-08 (×5): qty 1.5

## 2015-02-08 MED ORDER — POTASSIUM CHLORIDE 10 MEQ/50ML IV SOLN
INTRAVENOUS | Status: AC
  Filled 2015-02-08: qty 100

## 2015-02-08 MED ORDER — ENOXAPARIN SODIUM 40 MG/0.4ML ~~LOC~~ SOLN
40.0000 mg | SUBCUTANEOUS | Status: DC
  Administered 2015-02-08 – 2015-02-18 (×10): 40 mg via SUBCUTANEOUS
  Filled 2015-02-08 (×13): qty 0.4

## 2015-02-08 MED ORDER — 0.9 % SODIUM CHLORIDE (POUR BTL) OPTIME
TOPICAL | Status: DC | PRN
  Administered 2015-02-08 (×2): 1000 mL

## 2015-02-08 MED ORDER — DEXTROSE 10 % IV SOLN
INTRAVENOUS | Status: DC
  Administered 2015-02-08: 60 mL/h via INTRAVENOUS

## 2015-02-08 MED ORDER — FENTANYL CITRATE (PF) 250 MCG/5ML IJ SOLN
INTRAMUSCULAR | Status: AC
  Filled 2015-02-08: qty 5

## 2015-02-08 MED ORDER — SODIUM CHLORIDE 0.9 % IV SOLN
500.0000 mg | Freq: Two times a day (BID) | INTRAVENOUS | Status: DC
  Administered 2015-02-08 – 2015-02-09 (×4): 500 mg via INTRAVENOUS
  Filled 2015-02-08 (×7): qty 500

## 2015-02-08 MED ORDER — PHENYLEPHRINE HCL 10 MG/ML IJ SOLN
INTRAMUSCULAR | Status: DC | PRN
  Administered 2015-02-08: 40 ug via INTRAVENOUS
  Administered 2015-02-08: 80 ug via INTRAVENOUS

## 2015-02-08 MED ORDER — POTASSIUM CHLORIDE 10 MEQ/50ML IV SOLN
10.0000 meq | INTRAVENOUS | Status: AC
  Administered 2015-02-08 (×2): 10 meq via INTRAVENOUS

## 2015-02-08 MED ORDER — POTASSIUM CHLORIDE 10 MEQ/50ML IV SOLN
10.0000 meq | INTRAVENOUS | Status: AC
  Administered 2015-02-08 (×3): 10 meq via INTRAVENOUS
  Filled 2015-02-08 (×3): qty 50

## 2015-02-08 MED ORDER — DEXTROSE 10 % IV SOLN
INTRAVENOUS | Status: DC | PRN
  Administered 2015-02-08: 15:00:00 via INTRAVENOUS

## 2015-02-08 MED ORDER — VANCOMYCIN HCL 1000 MG IV SOLR
INTRAVENOUS | Status: DC | PRN
  Administered 2015-02-08: 1000 mL

## 2015-02-08 MED ORDER — ONDANSETRON HCL 4 MG/2ML IJ SOLN
INTRAMUSCULAR | Status: DC | PRN
  Administered 2015-02-08: 4 mg via INTRAVENOUS

## 2015-02-08 MED ORDER — MAGNESIUM SULFATE 2 GM/50ML IV SOLN
2.0000 g | Freq: Once | INTRAVENOUS | Status: AC
  Administered 2015-02-08: 2 g via INTRAVENOUS
  Filled 2015-02-08: qty 50

## 2015-02-08 MED ORDER — LIDOCAINE HCL (CARDIAC) 20 MG/ML IV SOLN
INTRAVENOUS | Status: DC | PRN
  Administered 2015-02-08: 100 mg via INTRAVENOUS

## 2015-02-08 MED ORDER — SODIUM CHLORIDE 0.9 % IJ SOLN
INTRAMUSCULAR | Status: AC
  Filled 2015-02-08: qty 10

## 2015-02-08 MED ORDER — BISACODYL 10 MG RE SUPP
10.0000 mg | Freq: Once | RECTAL | Status: AC
  Administered 2015-02-08: 10 mg via RECTAL
  Filled 2015-02-08: qty 1

## 2015-02-08 MED ORDER — CEFAZOLIN SODIUM-DEXTROSE 2-3 GM-% IV SOLR
INTRAVENOUS | Status: DC | PRN
  Administered 2015-02-08: 2 g via INTRAVENOUS

## 2015-02-08 MED ORDER — CLINIMIX E/DEXTROSE (5/15) 5 % IV SOLN
INTRAVENOUS | Status: AC
  Administered 2015-02-08: 18:00:00 via INTRAVENOUS
  Filled 2015-02-08: qty 1800

## 2015-02-08 MED ORDER — DEXMEDETOMIDINE HCL IN NACL 200 MCG/50ML IV SOLN
0.4000 ug/kg/h | INTRAVENOUS | Status: DC
  Administered 2015-02-08: 0.4 ug/kg/h via INTRAVENOUS
  Administered 2015-02-09: 0.5 ug/kg/h via INTRAVENOUS
  Administered 2015-02-09: 0.7 ug/kg/h via INTRAVENOUS
  Administered 2015-02-09: 0.5 ug/kg/h via INTRAVENOUS
  Administered 2015-02-10 (×3): 0.8 ug/kg/h via INTRAVENOUS
  Administered 2015-02-10: 0.6 ug/kg/h via INTRAVENOUS
  Administered 2015-02-10: 0.8 ug/kg/h via INTRAVENOUS
  Administered 2015-02-11 (×2): 0.9 ug/kg/h via INTRAVENOUS
  Filled 2015-02-08 (×11): qty 50

## 2015-02-08 MED ORDER — MIDAZOLAM HCL 2 MG/2ML IJ SOLN
INTRAMUSCULAR | Status: AC
  Filled 2015-02-08: qty 4

## 2015-02-08 MED ORDER — MIDAZOLAM HCL 5 MG/5ML IJ SOLN
INTRAMUSCULAR | Status: DC | PRN
  Administered 2015-02-08: 1 mg via INTRAVENOUS
  Administered 2015-02-08: 2 mg via INTRAVENOUS
  Administered 2015-02-08: 1 mg via INTRAVENOUS

## 2015-02-08 MED ORDER — FENTANYL CITRATE (PF) 100 MCG/2ML IJ SOLN
INTRAMUSCULAR | Status: DC | PRN
  Administered 2015-02-08 (×5): 50 ug via INTRAVENOUS

## 2015-02-08 MED ORDER — LACTATED RINGERS IV SOLN
INTRAVENOUS | Status: DC | PRN
  Administered 2015-02-08: 15:00:00 via INTRAVENOUS

## 2015-02-08 MED ORDER — ROCURONIUM BROMIDE 50 MG/5ML IV SOLN
INTRAVENOUS | Status: AC
  Filled 2015-02-08: qty 1

## 2015-02-08 MED ORDER — POTASSIUM CHLORIDE 10 MEQ/50ML IV SOLN
10.0000 meq | INTRAVENOUS | Status: AC
  Administered 2015-02-08 – 2015-02-09 (×5): 10 meq via INTRAVENOUS
  Filled 2015-02-08 (×5): qty 50

## 2015-02-08 MED FILL — Sodium Chloride IV Soln 0.9%: INTRAVENOUS | Qty: 2000 | Status: AC

## 2015-02-08 MED FILL — Lidocaine HCl IV Inj 20 MG/ML: INTRAVENOUS | Qty: 5 | Status: AC

## 2015-02-08 MED FILL — Mannitol IV Soln 20%: INTRAVENOUS | Qty: 500 | Status: AC

## 2015-02-08 MED FILL — Heparin Sodium (Porcine) Inj 1000 Unit/ML: INTRAMUSCULAR | Qty: 10 | Status: AC

## 2015-02-08 MED FILL — Electrolyte-R (PH 7.4) Solution: INTRAVENOUS | Qty: 3000 | Status: AC

## 2015-02-08 MED FILL — Calcium Chloride Inj 10%: INTRAVENOUS | Qty: 10 | Status: AC

## 2015-02-08 MED FILL — Sodium Bicarbonate IV Soln 8.4%: INTRAVENOUS | Qty: 50 | Status: AC

## 2015-02-08 SURGICAL SUPPLY — 64 items
APL SKNCLS STERI-STRIP NONHPOA (GAUZE/BANDAGES/DRESSINGS)
BAG DECANTER FOR FLEXI CONT (MISCELLANEOUS) ×4 IMPLANT
BENZOIN TINCTURE PRP APPL 2/3 (GAUZE/BANDAGES/DRESSINGS) IMPLANT
BLADE SAW SAG 29X58X.64 (BLADE) IMPLANT
CANISTER SUCTION 2500CC (MISCELLANEOUS) ×4 IMPLANT
CATH THORACIC 28FR RT ANG (CATHETERS) IMPLANT
CATH THORACIC 36FR (CATHETERS) IMPLANT
CATH THORACIC 36FR RT ANG (CATHETERS) IMPLANT
CONN Y 3/8X3/8X3/8  BEN (MISCELLANEOUS) ×2
CONN Y 3/8X3/8X3/8 BEN (MISCELLANEOUS) ×2 IMPLANT
CONT SPEC 4OZ CLIKSEAL STRL BL (MISCELLANEOUS) IMPLANT
DRAPE CARDIOVASCULAR INCISE (DRAPES) ×4
DRAPE CHEST BREAST 15X10 FENES (DRAPES) ×2 IMPLANT
DRAPE LAPAROSCOPIC ABDOMINAL (DRAPES) ×6 IMPLANT
DRAPE SLUSH/WARMER DISC (DRAPES) IMPLANT
DRAPE SRG 135X102X78XABS (DRAPES) IMPLANT
DRSG AQUACEL AG ADV 3.5X14 (GAUZE/BANDAGES/DRESSINGS) ×4 IMPLANT
DRSG PAD ABDOMINAL 8X10 ST (GAUZE/BANDAGES/DRESSINGS) IMPLANT
ELECT BLADE 4.0 EZ CLEAN MEGAD (MISCELLANEOUS) ×4
ELECT REM PT RETURN 9FT ADLT (ELECTROSURGICAL) ×4
ELECTRODE BLDE 4.0 EZ CLN MEGD (MISCELLANEOUS) IMPLANT
ELECTRODE REM PT RTRN 9FT ADLT (ELECTROSURGICAL) ×2 IMPLANT
GAUZE SPONGE 4X4 12PLY STRL (GAUZE/BANDAGES/DRESSINGS) ×4 IMPLANT
GAUZE XEROFORM 5X9 LF (GAUZE/BANDAGES/DRESSINGS) IMPLANT
GLOVE BIO SURGEON STRL SZ7 (GLOVE) ×2 IMPLANT
GLOVE BIO SURGEON STRL SZ7.5 (GLOVE) ×10 IMPLANT
GLOVE BIOGEL PI IND STRL 7.0 (GLOVE) IMPLANT
GLOVE BIOGEL PI INDICATOR 7.0 (GLOVE) ×2
GOWN STRL REUS W/ TWL LRG LVL3 (GOWN DISPOSABLE) ×8 IMPLANT
GOWN STRL REUS W/TWL LRG LVL3 (GOWN DISPOSABLE) ×16
HEMOSTAT POWDER SURGIFOAM 1G (HEMOSTASIS) IMPLANT
HEMOSTAT SURGICEL 2X14 (HEMOSTASIS) IMPLANT
KIT BASIN OR (CUSTOM PROCEDURE TRAY) ×4 IMPLANT
KIT REMOVER STAPLE SKIN (MISCELLANEOUS) ×2 IMPLANT
KIT ROOM TURNOVER OR (KITS) ×4 IMPLANT
KIT SUCTION CATH 14FR (SUCTIONS) IMPLANT
NS IRRIG 1000ML POUR BTL (IV SOLUTION) ×4 IMPLANT
PACK CHEST (CUSTOM PROCEDURE TRAY) ×4 IMPLANT
PAD ARMBOARD 7.5X6 YLW CONV (MISCELLANEOUS) ×8 IMPLANT
PIN SAFETY STERILE (MISCELLANEOUS) IMPLANT
RUBBERBAND STERILE (MISCELLANEOUS) IMPLANT
SPONGE GAUZE 4X4 12PLY STER LF (GAUZE/BANDAGES/DRESSINGS) ×2 IMPLANT
SPONGE LAP 18X18 X RAY DECT (DISPOSABLE) ×4 IMPLANT
STAPLER VISISTAT 35W (STAPLE) ×2 IMPLANT
STRAP MONTGOMERY 1.25X11-1/8 (MISCELLANEOUS) IMPLANT
SUT ETHILON 3 0 FSL (SUTURE) IMPLANT
SUT SILK  1 MH (SUTURE)
SUT SILK 1 MH (SUTURE) IMPLANT
SUT STEEL 6MS V (SUTURE) ×2 IMPLANT
SUT STEEL STERNAL CCS#1 18IN (SUTURE) IMPLANT
SUT STEEL SZ 6 DBL 3X14 BALL (SUTURE) ×4 IMPLANT
SUT STERNA BAND STERNOTOMY (SUTURE) IMPLANT
SUT VIC AB 1 CTX 18 (SUTURE) ×8 IMPLANT
SUT VIC AB 1 CTX 36 (SUTURE) ×8
SUT VIC AB 1 CTX36XBRD ANBCTR (SUTURE) IMPLANT
SUT VIC AB 2-0 CTX 27 (SUTURE) ×2 IMPLANT
SUT VIC AB 2-0 CTX 36 (SUTURE) ×2 IMPLANT
SUT VIC AB 3-0 X1 27 (SUTURE) ×4 IMPLANT
SWAB COLLECTION DEVICE MRSA (MISCELLANEOUS) ×2 IMPLANT
SYSTEM SAHARA CHEST DRAIN ATS (WOUND CARE) ×4 IMPLANT
TOWEL OR 17X24 6PK STRL BLUE (TOWEL DISPOSABLE) ×4 IMPLANT
TOWEL OR 17X26 10 PK STRL BLUE (TOWEL DISPOSABLE) ×4 IMPLANT
TUBE ANAEROBIC SPECIMEN COL (MISCELLANEOUS) IMPLANT
WATER STERILE IRR 1000ML POUR (IV SOLUTION) ×4 IMPLANT

## 2015-02-08 NOTE — Progress Notes (Signed)
PARENTERAL NUTRITION CONSULT NOTE - Follow Up  Pharmacy Consult for TPN Indication: Prolonged ileus  Allergies  Allergen Reactions  . Atorvastatin Other (See Comments)    Gas, chest tightness  . Crestor [Rosuvastatin Calcium] Other (See Comments)    Muscle aches   Patient Measurements: Height: 5' (152.4 cm) Weight: 117 lb 15.1 oz (53.5 kg) IBW/kg (Calculated) : 45.5  Usual Weight: ~100-106 pounds (46-48 kg)  Vital Signs: Temp: 99.9 F (37.7 C) (10/03 0600) BP: 92/40 mmHg (10/03 0600) Pulse Rate: 108 (10/03 0600) Intake/Output from previous day: 10/02 0701 - 10/03 0700 In: 3364.1 [I.V.:1992.1; NG/GT:90; IV Piggyback:100; TPN:1182] Out: 5340 [Urine:4900; Emesis/NG output:50; Chest Tube:390] Intake/Output from this shift:   Labs:  Recent Labs  02/05/15 1110 02/05/15 1606  02/05/15 2200  02/06/15 1638 02/06/15 1647 02/07/15 0309 02/08/15 0354  WBC  --   --   --  12.0*  < > 13.6*  --  13.9* 9.8  HGB  --   --   < > 9.9*  < > 9.4* 9.9* 9.9* 9.2*  HCT  --   --   < > 28.8*  < > 27.6* 29.0* 28.4* 27.3*  PLT  --   --   < > 54*  < > 79*  --  105* 123*  APTT 68* 72*  --  48*  --   --   --   --   --   INR  --   --   --  1.51*  --   --   --   --   --   < > = values in this interval not displayed.  Recent Labs  02/06/15 0421 02/06/15 1200 02/06/15 1638 02/06/15 1647 02/07/15 0309 02/08/15 0354  NA 136  --   --  132* 128* 129*  K 4.6  --   --  3.6 3.7 3.3*  CL 104  --   --  98* 98* 92*  CO2 19*  --   --   --  21* 28  GLUCOSE 136*  --   --  108* 186* 144*  BUN 22*  --   --  21* 22* 24*  CREATININE 1.24*  --  1.13* 1.20* 1.08* 0.88  CALCIUM 8.5*  --   --   --  7.8* 7.9*  MG 3.0*  --  2.2  --  1.9 1.6*  PHOS  --   --   --   --  4.7* 4.0  PROT 5.0*  --   --   --  4.8* 5.4*  ALBUMIN 3.1*  --   --   --  2.6* 2.3*  AST 65*  --   --   --  43* 44*  ALT 27  --   --   --  31 33  ALKPHOS 62  --   --   --  80 94  BILITOT 2.6*  --   --   --  2.4* 2.6*  PREALBUMIN  --   --    --   --  6.5* 6.6*  TRIG  --  148  --   --  132 131   Estimated Creatinine Clearance: 47.6 mL/min (by C-G formula based on Cr of 0.88).   Recent Labs  02/07/15 1934 02/08/15 0010 02/08/15 0354  GLUCAP 169* 160* 147*   Medical History: Past Medical History  Diagnosis Date  . Hyperlipidemia   . Acute systolic CHF (congestive heart failure), NYHA class 3 ([place]) 07/10/2014  . Hypertension  Medications:  Infusions:  . Marland KitchenTPN (CLINIMIX-E) Adult 60 mL/hr at 02/08/15 0600  . sodium chloride 20 mL/hr at 02/08/15 0600  . sodium chloride    . sodium chloride 20 mL/hr at 02/08/15 0600  . epinephrine Stopped (02/06/15 1300)  . furosemide (LASIX) infusion 8 mg/hr (02/08/15 0600)  . milrinone 0.3 mcg/kg/min (02/08/15 0600)  . morphine 4 mg/hr (02/08/15 0600)  . norepinephrine (LEVOPHED) Adult infusion 6 mcg/min (02/08/15 0600)  . propofol (DIPRIVAN) infusion 20.681 mcg/kg/min (02/08/15 0600)   Admit:  64 yo female with cardiac problems and recent CABG on 9/26 and Centrimag removal on 9/30 with an open sternum and plans for return to OR on Monday.  She was in her usual state of health prior to admit with a weight range of 100-105 pounds.  She has some fluid gain and her weight currently is ~ 133 pounds.  Surgeries/Procedures: S/p CABG x4, TEE, Removal of CVAD  Insulin Requirements in the past 24 hours:  26 units of SSI  Current Nutrition:  Currently NPO Clinimix E 5/15 @ 5m/hr (This will provide 72g of protein and ~ 1000kcal) Propofol @ 212m/kg/min (This provides ~175 kcal)  Nutritional Goals: per RD recs on 10/1 1270 kCal > 90 gm of protein  GI: Post-op ileus, no bowel sounds, no stools, has little Emesis/NG output, chest tube output decreased to 39056mesterday. PPI via NG tube. Albumin low at 2.3. Prealbumin low at 6.6.  Endo: CBGs trending down over the last 24 hours (170s > 140s)  Lytes: Na low at 129, K low at 3.3 (goal > 4) replaced. CoCa = 9.1. Phos ok and Mg low at  1.6 (goal > 2) replaced. Ca x Phos < 55.  Renal: SCr down to 0.88, CrCl ~26m68mn. Excellent diuresis (3.2ml/28mhr) for past 2 days On lasix gtt at 8mg/h28mHas NS and 1/2NS running at 20ml/h23mulm: Remains intubated and sedated with propofol (rate decreased yesterday 50mcg/k73mn>>20mcg/kg/min this AM - 174kcal)  Cards: S/p CABG x 4 - placement of CAVD (and removed). Plan to close sternum today. Tachy this morning and tenuous BP - Levo and Milrinone infusing  Hepatobil: LFTs stable, AST slightly elevated and ALT wnl. Alk Phos WNL.  T. Bili slightly elevated 2.6 (no jaundice noted)  HEME: Post-op anemia and thrombocytopenia - stable (9.9/28.4, 105K)  Neuro: Sedated - on propofol. Trig stable at 131. On morphine gtt  ID: No current abx. Tmax to 100.8 over last 24 hours, currently afebrile. WBC down to wnl. Wound and blood cultures NGTD  Best Practices:  SCD's  TPN Access:  PICC - placed 9/28  TPN start date: 10/1 >>  Plan:  Increase Clinimix E 5/15 to 75 ml/hr (Provides 90g of protein and 1278 kcal meeting 100% of protein and kcal needs) Hold IVFE for first 7 days for ICU patients per ASPEN guidelines (Day 3) Patient currently on propofol @ 20mcg/kg26m (Provides ~175kcal per day) Continue MVI and trace elements in TPN Add Pepcid to TPN Add 20 units of regular insulin to TPN Continue SSI and adjust as needed Monitor TPN labs  Give K 10mEq IV 2mruns per RN protocol Give Mg 2g IV x 1 per MD  Lincoln Ginley BatElenor Quinoneslinical Pharmacist Pager [phone] 7:04 AM

## 2015-02-08 NOTE — Progress Notes (Signed)
      [address]       [place] [address]             [phone]       S/p sternal closure  BP 102/50 mmHg  Pulse 124  Temp(Src) 99.1 F (37.3 C) (Core (Comment))  Resp 14  Ht 5' (1.524 m)  Wt 117 lb 15.1 oz (53.5 kg)  BMI 23.03 kg/m2  SpO2 100%   Intake/Output Summary (Last 24 hours) at 02/08/15 1733 Last data filed at 02/08/15 1727  Gross per 24 hour  Intake 3818.54 ml  Output   4790 ml  Net -971.46 ml   Just back from OR, stable so far  [place] C. Dorris Fetch, MD [facility] [phone]

## 2015-02-08 NOTE — Progress Notes (Signed)
Utilization review completed.  

## 2015-02-08 NOTE — Anesthesia Postprocedure Evaluation (Signed)
  Anesthesia Post-op Note  Patient: Breanna Alvarez  Procedure(s) Performed: Procedure(s): STERNAL CLOSURE (N/A) TRANSESOPHAGEAL ECHOCARDIOGRAM (TEE) (N/A)  Patient Location: SICU  Anesthesia Type:General  Level of Consciousness: sedated and Patient remains intubated per anesthesia plan  Airway and Oxygen Therapy: Patient remains intubated per anesthesia plan and Patient placed on Ventilator (see vital sign flow sheet for setting)  Post-op Pain: none  Post-op Assessment: Post-op Vital signs reviewed, Patient's Cardiovascular Status Stable, Respiratory Function Stable and Patent Airway LLE Motor Response: Purposeful movement, Responds to commands   RLE Motor Response: Purposeful movement, Responds to commands        Post-op Vital Signs: stable  Last Vitals:  Filed Vitals:   02/08/15 1724  BP:   Pulse: 124  Temp:   Resp: 14    Complications: No apparent anesthesia complications

## 2015-02-08 NOTE — Op Note (Signed)
Breanna Alvarez, Breanna Alvarez NO.:  [id number]  MEDICAL RECORD NO.:  [id number]  LOCATION:  2S08C                        FACILITY:  [facility]  PHYSICIAN:  Kerin Perna, M.D.  DATE OF BIRTH:  [birth year]  DATE OF PROCEDURE:  02/05/2015 DATE OF DISCHARGE:                              OPERATIVE REPORT   OPERATION:  Decannulation from CentriMag left ventricular assist device  SURGEON:  Kerin Perna, M.D.  ANESTHESIA:  General.  PREOPERATIVE DIAGNOSES:  Ischemic cardiomyopathy with severe coronary artery disease status post CABG x4, 72 hours previously with LV post pump dysfunction, treated with CentriMag left ventricular assist device (LVAD) cannulation of LV apex and ascending aorta.  POSTOPERATIVE DIAGNOSES:  Ischemic cardiomyopathy with severe coronary artery disease status post CABG x4, 72 hours previously with LV post pump dysfunction, treated with CentriMag left ventricular assist device (LVAD) cannulation of LV apex and ascending aorta.  CLINICAL NOTE:  The patient is a 64 year old [place] female, with ischemic cardiomyopathy and ejection fraction of 15%.  She underwent CABG x4, 72 hours previously, but had LV dysfunction after separation from cardiopulmonary bypass and required placement of a percutaneous temporary LVAD to come off pump and returned to the ICU.  She remained stable in the ICU with excellent hemodynamics and normal neurologic function and good renal function and showed evidence of LV function improvement by turning down the flow and speed of the LVAD.  It was decided to take the patient back to the operating room to decannulate the LVAD.  Informed consent was obtained from the family, who understood the risks of death, bleeding, and infection.  DESCRIPTION OF PROCEDURE:  The patient was brought to the operating and placed carefully on the operating table.  The previously placed chest tubes were carefully removed.  The chest was fully prepped  including the elevated cannulas as a sterile field.  A proper time-out was performed.  The Esmarch surgical sheet covering the mediastinum was then carefully removed.  A sternal retractor was placed.  There was a minimal amount of clotted and non-clotted blood in the mediastinum.  This was irrigated with a warm saline.  The patient was given a therapeutic dose of heparin and the ACT was documented as being therapeutic.  A pursestring was placed in the right atrium and a 31-French venous cannula was placed, metal tip.  The ACT had been documented as being therapeutic for bypass. At this point, we clamped both the inflow and outflow lines of the LVAD and then converted the inflow aortic LVAD line to the inflow for cardiopulmonary bypass and went on full cardiopulmonary bypass.  Next, we insufflated the surgical field with CO2.  We gently raised the LV apex, so as to expose the cannulation site of the outflow elevated cannula.  The pursestrings were loosened and the cannula was removed and directed away from the field.  The sutures were tied.  There was still some bleeding from the LV apex.  This required a 2-0 Ethibond sutures and a fine layer of biologic adhesive for complete hemostasis.  The LV apex was then gently placed back in the pericardium.  Next, we prepared the patient for separation of cardiopulmonary bypass  by initiating low-dose inotropes, resuming ventilation and slowly reducing flows from cardiopulmonary bypass.  She separated from cardiopulmonary bypass without any trouble.  LV function was significantly improved, with ejection fraction of approximately 45%. The venous cannula was removed.  Protamine was administered.  Volume was administered to the patient to maintain adequate hemodynamics.  Cardiac output was greater than 4 L.  The aortic inflow cannula was then removed.  Her pursestrings were tied. This was reinforced with a 4-0 pledgeted Prolene for  complete hemostasis.  The mediastinum was then irrigated with warm saline.  The RV was still somewhat dilated and I did not feel it could be safely compressed by closing the sternal bone.  It was elected to leave the sternum open but to close the soft tissue of the chest wall and the skin over the sternum for later sternal closure.  Anterior mediastinal and pleural tubes were placed and brought out through separate incisions.  The pectoralis fascia was then closed with some interrupted Ethibond sutures and the skin was closed with staples.  The patient remained completely stable during this type of closure.  The new chest tubes were connected to underwater seal Pleur-Evac drainage system and the patient returned to the ICU.  Total cardiopulmonary bypass time was 22 minutes.     Kerin Perna, M.D.     PV/MEDQ  D:  02/07/2015  T:  02/07/2015  Job:  [id number]

## 2015-02-08 NOTE — Anesthesia Preprocedure Evaluation (Addendum)
Anesthesia Evaluation  Patient identified by MRN, date of birth, ID band Patient unresponsive  General Assessment Comment:Intubated and sedated  Reviewed: Allergy & Precautions, H&P , NPO status , Patient's Chart, lab work & pertinent test results, Unable to perform ROS - Chart review only  Airway Mallampati: Intubated       Dental no notable dental hx.    Pulmonary neg pulmonary ROS,    Pulmonary exam normal breath sounds clear to auscultation       Cardiovascular hypertension, Pt. on medications + angina + CAD, + Past MI, + CABG and +CHF   Rhythm:Regular Rate:Normal  EF 10-20%   Neuro/Psych negative neurological ROS  negative psych ROS   GI/Hepatic negative GI ROS, Neg liver ROS,   Endo/Other  negative endocrine ROS  Renal/GU negative Renal ROS     Musculoskeletal   Abdominal   Peds  Hematology  (+) anemia ,   Anesthesia Other Findings   Reproductive/Obstetrics negative OB ROS                            Anesthesia Physical  Anesthesia Plan  ASA: IV  Anesthesia Plan: General   Post-op Pain Management:    Induction: Intravenous  Airway Management Planned: Oral ETT  Additional Equipment:   Intra-op Plan:   Post-operative Plan: Post-operative intubation/ventilation  Informed Consent: I have reviewed the patients History and Physical, chart, labs and discussed the procedure including the risks, benefits and alternatives for the proposed anesthesia with the patient or authorized representative who has indicated his/her understanding and acceptance.   Dental advisory given  Plan Discussed with: CRNA  Anesthesia Plan Comments: (Pt. Intubated previously and has all lines in place from CABG. )        Anesthesia Quick Evaluation

## 2015-02-08 NOTE — Progress Notes (Signed)
At progressions nurse requested chaplain pray with patient. Chaplain did so, although patient appeared unresponsive.

## 2015-02-08 NOTE — Progress Notes (Signed)
130cc of morphine wasted down sink, witnessed by Burna Cash RN.

## 2015-02-08 NOTE — Brief Op Note (Signed)
01/26/2015 - 02/08/2015  5:06 PM  PATIENT:  Breanna Alvarez  64 y.o. female  PRE-OPERATIVE DIAGNOSIS:  OPEN CHEST POST CABG AND CENTRIMAG  POST-OPERATIVE DIAGNOSIS:  OPEN CHEST POST CABG AND CENTRIMAG  PROCEDURE:  Procedure(s): STERNAL CLOSURE (N/A) TRANSESOPHAGEAL ECHOCARDIOGRAM (TEE) (N/A)  SURGEON:  Surgeon(s) and Role:    * Kerin Perna, MD - Primary  PHYSICIAN ASSISTANT:   ASSISTANTS: none   ANESTHESIA:   general  EBL:  Total I/O In: 1638.6 [I.V.:1158.6; NG/GT:60; IV Piggyback:300; TPN:120] Out: 1660 [Urine:1550; Blood:50; Chest Tube:60]  BLOOD ADMINISTERED:none  DRAINS: none   LOCAL MEDICATIONS USED:  NONE  SPECIMEN:  Aspirate  DISPOSITION OF SPECIMEN:  micro lab culture of sternal wound  COUNTS:  YES  TOURNIQUET:  * No tourniquets in log *  DICTATION: .Dragon Dictation  PLAN OF CARE: Admit to inpatient   PATIENT DISPOSITION:  PACU - hemodynamically stable.   Delay start of Pharmacological VTE agent (>24hrs) due to surgical blood loss or risk of bleeding: yes

## 2015-02-08 NOTE — Progress Notes (Signed)
Advanced ETT 2cm. Patient is 21 at the lips at this time, with equal breath sounds. RT will continue to monitor.

## 2015-02-08 NOTE — Transfer of Care (Signed)
Immediate Anesthesia Transfer of Care Note  Patient: Breanna Alvarez  Procedure(s) Performed: Procedure(s): STERNAL CLOSURE (N/A) TRANSESOPHAGEAL ECHOCARDIOGRAM (TEE) (N/A)  Patient Location: SICU  Anesthesia Type:General  Level of Consciousness: sedated and Patient remains intubated per anesthesia plan  Airway & Oxygen Therapy: Patient remains intubated per anesthesia plan and Patient placed on Ventilator (see vital sign flow sheet for setting)  Post-op Assessment: Report given to RN and Post -op Vital signs reviewed and stable  Post vital signs: Reviewed and stable  Last Vitals:  Filed Vitals:   02/08/15 1724  BP:   Pulse: 124  Temp:   Resp: 14    Complications: No apparent anesthesia complications   Pt tx from OR to ICU with standard monitors (HR, BP, SPO2). Emergency drugs and equipment available. Dr. Noreene Larsson helping and present during transport. Report given to ICU RN and RT and all questions answered. Airway intact.    Minus Liberty CRNA

## 2015-02-08 NOTE — Progress Notes (Signed)
3 Days Post-Op Procedure(s) (LRB): REMOVAL OF CENTRIMAG VENTRICULAR ASSIST DEVICE (N/A) CANNULATION FOR CARDIOPULMONARY BYPASS (N/A) TRANSESOPHAGEAL ECHOCARDIOGRAM (TEE) (N/A) Subjective: persistent low grade fever Hemodynamics stable on min inotropes CXR clear  great diuresis Will close sternum today  Objective: Vital signs in last 24 hours: Temp:  [99.7 F (37.6 C)-100.8 F (38.2 C)] 100.2 F (37.9 C) (10/03 0700) Pulse Rate:  [105-130] 123 (10/03 0813) Cardiac Rhythm:  [-] Sinus tachycardia (10/02 1600) Resp:  [11-21] 18 (10/03 0813) BP: (85-149)/(33-107) 149/57 mmHg (10/03 0813) SpO2:  [89 %-100 %] 100 % (10/03 0813) FiO2 (%):  [40 %] 40 % (10/03 0813) Weight:  [117 lb 15.1 oz (53.5 kg)] 117 lb 15.1 oz (53.5 kg) (10/03 0600)  Hemodynamic parameters for last 24 hours: PAP: (20-40)/(10-23) 27/17 mmHg CO:  [3.9 L/min-5 L/min] 4.4 L/min CI:  [2.8 L/min/m2-3.6 L/min/m2] 3.2 L/min/m2  Intake/Output from previous day: 10/02 0701 - 10/03 0700 In: 3509.5 [I.V.:2077.5; NG/GT:90; IV Piggyback:100; TPN:1242] Out: 5340 [Urine:4900; Emesis/NG output:50; Chest Tube:390] Intake/Output this shift:    Lungs clear Extremities warm  Lab Results:  Recent Labs  02/07/15 0309 02/08/15 0354  WBC 13.9* 9.8  HGB 9.9* 9.2*  HCT 28.4* 27.3*  PLT 105* 123*   BMET:  Recent Labs  02/07/15 0309 02/08/15 0354  NA 128* 129*  K 3.7 3.3*  CL 98* 92*  CO2 21* 28  GLUCOSE 186* 144*  BUN 22* 24*  CREATININE 1.08* 0.88  CALCIUM 7.8* 7.9*    PT/INR:  Recent Labs  02/05/15 2200  LABPROT 18.2*  INR 1.51*   ABG    Component Value Date/Time   PHART 7.468* 02/08/2015 0500   HCO3 27.4* 02/08/2015 0500   TCO2 28.6 02/08/2015 0500   ACIDBASEDEF 6.0* 02/07/2015 0245   O2SAT 99.4 02/08/2015 0500   CBG (last 3)   Recent Labs  02/07/15 1934 02/08/15 0010 02/08/15 0354  GLUCAP 169* 160* 147*    Assessment/Plan: S/P Procedure(s) (LRB): REMOVAL OF CENTRIMAG VENTRICULAR ASSIST  DEVICE (N/A) CANNULATION FOR CARDIOPULMONARY BYPASS (N/A) TRANSESOPHAGEAL ECHOCARDIOGRAM (TEE) (N/A) Sternal closure today Cont antibiotics   LOS: 13 days    Breanna Alvarez 02/08/2015

## 2015-02-08 NOTE — Progress Notes (Signed)
Advanced Heart Failure Rounding Note   Subjective:    S/p CABG with Centrimag placement 02/01/15  Centrimag explanted 9/30. Chest remains open due to swelling.   Remains intubated. Awake at times but not following commands.  Remains on milrinone 0.3, levophed 6 mcg, and lasix drip at 8 mg per hour.    PA 35/20  Thermo CO/CI 4.9/ 3.5   Platelets 68k.-> 105-->123 k  HIT negative by SRA.     Objective:   Weight Range:  Vital Signs:   Temp:  [99.7 F (37.6 C)-100.6 F (38.1 C)] 100.2 F (37.9 C) (10/03 0700) Pulse Rate:  [105-123] 112 (10/03 1103) Resp:  [11-18] 14 (10/03 1103) BP: (85-149)/(33-58) 118/49 mmHg (10/03 1103) SpO2:  [92 %-100 %] 100 % (10/03 1103) FiO2 (%):  [40 %] 40 % (10/03 1103) Weight:  [117 lb 15.1 oz (53.5 kg)] 117 lb 15.1 oz (53.5 kg) (10/03 0600) Last BM Date: 01/31/15  Weight change: Filed Weights   02/06/15 0500 02/07/15 0700 02/08/15 0600  Weight: 133 lb 9.6 oz (60.6 kg) 121 lb 4.1 oz (55 kg) 117 lb 15.1 oz (53.5 kg)    Intake/Output:   Intake/Output Summary (Last 24 hours) at 02/08/15 1225 Last data filed at 02/08/15 0700  Gross per 24 hour  Intake 2692.5 ml  Output   4300 ml  Net -1607.5 ml     Physical Exam:   General:  Intubated opens eyes but doesn't follow comamnds HEENT: normal x ETT Neck: supple. RIJ swan Cor: Chest open with dressing. Tachy regular Lungs: clear Abdomen: soft, nontender, + distended. Hypoactive bowel sounds. Extremities: no cyanosis, clubbing, rash,  R and L thigh 2+ edema Neuro: intubated/sedated  Telemetry: Sinus tach 110-120  Labs: Basic Metabolic Panel:  Recent Labs Lab 02/04/15 0410  02/05/15 0400  02/05/15 1939 02/05/15 2208 02/06/15 0421 02/06/15 1638 02/06/15 1647 02/07/15 0309 02/08/15 0354  NA 137  < > 135  < > 136 135 136  --  132* 128* 129*  K 3.9  < > 3.8  < > 3.5 3.3* 4.6  --  3.6 3.7 3.3*  CL 108  < > 105  < > 104  --  104  --  98* 98* 92*  CO2 21*  --  22  --   --   --   19*  --   --  21* 28  GLUCOSE 109*  < > 114*  < > 114* 119* 136*  --  108* 186* 144*  BUN 19  < > 22*  < > 23*  --  22*  --  21* 22* 24*  CREATININE 1.21*  < > 1.26*  < > 1.10*  --  1.24* 1.13* 1.20* 1.08* 0.88  CALCIUM 8.1*  --  8.3*  --   --   --  8.5*  --   --  7.8* 7.9*  MG 1.7  --  2.0  --   --   --  3.0* 2.2  --  1.9 1.6*  PHOS  --   --   --   --   --   --   --   --   --  4.7* 4.0  < > = values in this interval not displayed.  Liver Function Tests:  Recent Labs Lab 02/04/15 0410 02/05/15 0400 02/06/15 0421 02/07/15 0309 02/08/15 0354  AST 52* 57* 65* 43* 44*  ALT 33  ALKPHOS 51 55 62 80 94  BILITOT 1.7*  1.6* 2.6* 2.4* 2.6*  PROT 4.7* 5.2* 5.0* 4.8* 5.4*  ALBUMIN 3.3* 3.6 3.1* 2.6* 2.3*   No results for input(s): LIPASE, AMYLASE in the last 168 hours. No results for input(s): AMMONIA in the last 168 hours.  CBC:  Recent Labs Lab 02/05/15 2200  02/06/15 0421 02/06/15 1638 02/06/15 1647 02/07/15 0309 02/08/15 0354  WBC 12.0*  --  11.6* 13.6*  --  13.9* 9.8  NEUTROABS  --   --   --   --   --  10.0* 6.6  HGB 9.9*  < > 10.2* 9.4* 9.9* 9.9* 9.2*  HCT 28.8*  < > 29.3* 27.6* 29.0* 28.4* 27.3*  MCV 85.7  --  85.7 85.2  --  86.6 86.9  PLT 54*  --  68* 79*  --  105* 123*  < > = values in this interval not displayed.  Cardiac Enzymes: No results for input(s): CKTOTAL, CKMB, CKMBINDEX, TROPONINI in the last 168 hours.  BNP: BNP (last 3 results)  Recent Labs  07/10/14 1315  BNP 1059.1*    ProBNP (last 3 results) No results for input(s): PROBNP in the last 8760 hours.    Other results:  Imaging: Dg Chest Port 1 View  02/08/2015   CLINICAL DATA:  Atelectasis.  EXAM: PORTABLE CHEST 1 VIEW  COMPARISON:  02/07/2015.  FINDINGS: Endotracheal tube, NG tube, right chest tube, mediastinal drainage tubes and left PICC line stable position. The tip of the right PICC line in slightly coiled in the region of the superior vena cava and unchanged. Prior CABG.  Stable cardiomegaly. Interim near complete clearing of pulmonary interstitial prominence. Mild bibasilar atelectasis. Tiny left pleural effusion cannot be excluded .  IMPRESSION: 1. Lines and tubes in stable position as above. 2. Prior CABG. Stable cardiomegaly. Interim near complete clearing of pulmonary interstitial prominence and small left pleural effusion.   Electronically Signed   By: Maisie Fus  Register   On: 02/08/2015 07:52   Dg Chest Port 1 View  02/07/2015   CLINICAL DATA:  Hypoxia and atelectatic change  EXAM: PORTABLE CHEST 1 VIEW  COMPARISON:  February 06, 2015  FINDINGS: Endotracheal tube tip is 5.5 cm above the carina. Swan-Ganz catheter tip is in the main pulmonary outflow tract directed toward the right. Left central catheter tip may be in the azygos vein. Chest tubes and mediastinal drains remain without change. Temporary pacemaker wires are attached to the right heart. No pneumothorax.  There is increase in airspace consolidation in the left base and right mid lung regions. There is a small left effusion. Heart is upper normal in size with pulmonary vascularity within normal limits. No adenopathy.  IMPRESSION: Tube and catheter positions as described. The left subclavian catheter tip may be in the azygos vein. It has withdrawn several cm compared to 1 day prior.  Increasing patchy airspace disease in the right mid lung and left base. Question congestive heart failure versus superimposed pneumonia. Both entities may exist concurrently.   Electronically Signed   By: Bretta Bang III M.D.   On: 02/07/2015 07:28     Medications:     Scheduled Medications: . sodium chloride   Intravenous Once  . antiseptic oral rinse  7 mL Mouth Rinse q12n4p  . aspirin EC  325 mg Oral Daily   Or  . aspirin  324 mg Per Tube Daily  . cefUROXime (ZINACEF)  IV  1.5 g Intravenous Q12H  . chlorhexidine  15 mL Mouth Rinse BID  . enoxaparin (LOVENOX) injection  40 mg Subcutaneous Q24H  . insulin aspart   0-24 Units Subcutaneous 6 times per day  . metoCLOPramide (REGLAN) injection  10 mg Intravenous 4 times per day  . mupirocin ointment  1 application Topical BID  . pantoprazole sodium  40 mg Per Tube Daily  . sodium chloride  10-40 mL Intracatheter Q12H  . sodium chloride  3 mL Intravenous Q12H  . sodium chloride  3 mL Intravenous Q12H  . vancomycin  500 mg Intravenous Q12H    Infusions: . Marland KitchenTPN (CLINIMIX-E) Adult 60 mL/hr at 02/08/15 0700  . Marland KitchenTPN (CLINIMIX-E) Adult    . sodium chloride 20 mL/hr at 02/08/15 0700  . sodium chloride    . sodium chloride 20 mL/hr at 02/08/15 0700  . epinephrine Stopped (02/06/15 1300)  . furosemide (LASIX) infusion 8 mg/hr (02/08/15 0700)  . milrinone 0.3 mcg/kg/min (02/08/15 0700)  . morphine 4 mg/hr (02/08/15 0700)  . norepinephrine (LEVOPHED) Adult infusion 6 mcg/min (02/08/15 0700)  . propofol (DIPRIVAN) infusion 20.681 mcg/kg/min (02/08/15 0700)    PRN Medications: sodium chloride, HYDROmorphone (DILAUDID) injection, metoprolol, midazolam, midazolam, ondansetron (ZOFRAN) IV, sodium chloride, sodium chloride, sodium chloride   Assessment:   1. Cardiogenic shock   --s/p CABG with centrimag placement 02/01/15. Centrimag removed 9/30 2. CAD s/p CABG 3. 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 Acute respiratory failure 5. Acute blood loss anemia 6. Thrombocytopenia: HIT negative by SRA 7. Hyponatremia 8. Hypokalemia  Plan/Discussion:   Plan to return to the OR today for sternal closure.   Remains of lasix drip, norepi, and milrinone.   Length of Stay: 13  CLEGG,AMY NP-C  02/08/2015, 12:25 PM  Advanced Heart Failure Team Pager [phone] (M-F; 7a - 4p)  Please contact [facility] for night-coverage after hours (4p -7a ) and weekends on [email or web address]  Patient seen and examined with Tonye Becket, NP. We discussed all aspects of the encounter. I agree with the assessment and plan as stated above.   Not following commands this am. Now s/p sternal closure this  afternoon. Diuresing well. Hemodynamics stable. Will continue current inotropic support. Try to wake-up and extubate in am if possible. Will need repeat echo this week.   The patient is critically ill with multiple organ systems failure and requires high complexity decision making for assessment and support, frequent evaluation and titration of therapies, application of advanced monitoring technologies and extensive interpretation of multiple databases.   Critical Care Time devoted to patient care services described in this note is 35 Minutes.   Markon Jares,MD 5:52 PM

## 2015-02-08 NOTE — Addendum Note (Signed)
Addendum  created 02/08/15 1917 by Kipp Brood, MD   Modules edited: Notes Section   Notes Section:  File: [id number]; Pend: [id number]; [place] Low: [id number]; [place] Low: [id number]; Pend: [phone]

## 2015-02-08 NOTE — CV Procedure (Signed)
Intra-operative Transesophageal Echocardiography Report:  Breanna Alvarez is a 64 year old female who underwent coronary artery bypass grafting 4 on 02/01/2015. Prior to surgery, she was noted to have severe left ventricular dysfunction. Upon separation from cardiopulmonary bypass a centri-mag left ventricular assist device was inserted by Dr. Donata Clay due to hemodynamic instability. The centri-mag was  removed on 02/05/2015 but the sternotomy was left open. She is now scheduled to return to the operating room for closure of her sternotomy. Intraoperative transesophageal echocardiography was requested to evaluate the cardiac function before and after closure of the sternotomy and to assist with hemodynamic management.  The patient was brought to the operating room at [facility]. She has remained intubated and on the ventilator since her initial surgery on 9/26. The stomach was then suctioned and the transesophageal echocardiography probe was inserted without difficulty.  Impression: Pre-sternotomy closure:  1. Aortic valve: The aortic valve was trileaflet. The leaflets opened normally without restriction. There was no aortic insufficiency.  2. Mitral valve: The mitral leaflets appeared to open without restriction. There was trace mitral regurgitation.  3. Left ventricle: There was severe left ventricular dysfunction. The mid and distal anterior wall, septum, and inferior walls were akinetic. The interventricular septum was dyskinetic intermittently. There appeared to be adequate contractility of the lateral wall and basilar inferior wall. The ejection fraction was estimated at 15-20%. There was no thrombus noted within the left ventricular cavity.  4. Right ventricle: The right ventricular cavity was of normal size. There appeared to be some reduced contractility the right ventricular free wall and mild right ventricular dysfunction.  5. Tricuspid valve: The tricuspid leaflets appeared  structurally normal and there was 1-2 + tricuspid insufficiency.  Post-sternotomy closure examination:  1. Aortic valve: The aortic valve appeared unchanged from the pre-closure exam. The leaflets opened normally and there was no aortic insufficiency.  2. Mitral valve: The mitral leaflets opened normally and there was trace mitral insufficiency.  3. Left ventricle: The left ventricular function appeared unchanged from the pre-closure study. The ejection fraction was estimated at 15-20%  4. Right ventricle: The right ventricular cavity was of normal size. There was reduced contractility of the right ventricular free wall and mild right ventricular dysfunction which was unchanged from the pre-bypass study.  5. Tricuspid valve: The tricuspid valve appeared structurally normal and there was 2+ tricuspid insufficiency.  Kipp Brood, MD

## 2015-02-09 ENCOUNTER — Inpatient Hospital Stay (HOSPITAL_COMMUNITY): Payer: Medicaid Other

## 2015-02-09 ENCOUNTER — Encounter (HOSPITAL_COMMUNITY): Payer: Self-pay | Admitting: Cardiothoracic Surgery

## 2015-02-09 LAB — BLOOD GAS, ARTERIAL
Acid-Base Excess: 5.1 mmol/L — ABNORMAL HIGH (ref 0.0–2.0)
Bicarbonate: 28.6 mEq/L — ABNORMAL HIGH (ref 20.0–24.0)
Drawn by: 437071
FIO2: 0.4
MECHVT: 500 mL
O2 Saturation: 98.5 %
PEEP: 5 cmH2O
Patient temperature: 99.2
RATE: 14 resp/min
TCO2: 29.8 mmol/L (ref 0–100)
pCO2 arterial: 39.7 mmHg (ref 35.0–45.0)
pH, Arterial: 7.473 — ABNORMAL HIGH (ref 7.350–7.450)
pO2, Arterial: 119 mmHg — ABNORMAL HIGH (ref 80.0–100.0)

## 2015-02-09 LAB — COMPREHENSIVE METABOLIC PANEL
ALT: 30 U/L (ref 14–54)
AST: 36 U/L (ref 15–41)
Albumin: 2.1 g/dL — ABNORMAL LOW (ref 3.5–5.0)
Alkaline Phosphatase: 121 U/L (ref 38–126)
Anion gap: 9 (ref 5–15)
BUN: 29 mg/dL — ABNORMAL HIGH (ref 6–20)
CO2: 27 mmol/L (ref 22–32)
Calcium: 7.8 mg/dL — ABNORMAL LOW (ref 8.9–10.3)
Chloride: 89 mmol/L — ABNORMAL LOW (ref 101–111)
Creatinine, Ser: 0.92 mg/dL (ref 0.44–1.00)
GFR calc Af Amer: >60 mL/min (ref >60)
GFR calc non Af Amer: >60 mL/min (ref >60)
Glucose, Bld: 149 mg/dL — ABNORMAL HIGH (ref 65–99)
Potassium: 3.6 mmol/L (ref 3.5–5.1)
Sodium: 125 mmol/L — ABNORMAL LOW (ref 135–145)
Total Bilirubin: 3.7 mg/dL — ABNORMAL HIGH (ref 0.3–1.2)
Total Protein: 5.1 g/dL — ABNORMAL LOW (ref 6.5–8.1)

## 2015-02-09 LAB — CARBOXYHEMOGLOBIN
Carboxyhemoglobin: 1.5 % (ref 0.5–1.5)
Methemoglobin: 1.1 % (ref 0.0–1.5)
O2 Saturation: 56.2 %
Total hemoglobin: 8.6 g/dL — ABNORMAL LOW (ref 12.0–16.0)

## 2015-02-09 LAB — CALCIUM, IONIZED: Calcium, Ionized, Serum: 4.6 mg/dL (ref 4.5–5.6)

## 2015-02-09 LAB — GLUCOSE, CAPILLARY
GLUCOSE-CAPILLARY: 155 mg/dL — AB (ref 65–99)
GLUCOSE-CAPILLARY: 126 mg/dL — AB (ref 65–99)
Glucose-Capillary: 183 mg/dL — ABNORMAL HIGH (ref 65–99)
Glucose-Capillary: 147 mg/dL — ABNORMAL HIGH (ref 65–99)
Glucose-Capillary: 138 mg/dL — ABNORMAL HIGH (ref 65–99)
Glucose-Capillary: 191 mg/dL — ABNORMAL HIGH (ref 65–99)
Glucose-Capillary: 176 mg/dL — ABNORMAL HIGH (ref 65–99)

## 2015-02-09 LAB — POCT I-STAT, CHEM 8
BUN: 38 mg/dL — ABNORMAL HIGH (ref 6–20)
CALCIUM ION: 1.09 mmol/L — AB (ref 1.13–1.30)
Chloride: 88 mmol/L — ABNORMAL LOW (ref 101–111)
Creatinine, Ser: 1.1 mg/dL — ABNORMAL HIGH (ref 0.44–1.00)
GLUCOSE: 165 mg/dL — AB (ref 65–99)
HCT: 25 % — ABNORMAL LOW (ref 36.0–46.0)
Hemoglobin: 8.5 g/dL — ABNORMAL LOW (ref 12.0–15.0)
Potassium: 3.5 mmol/L (ref 3.5–5.1)
SODIUM: 128 mmol/L — AB (ref 135–145)
TCO2: 27 mmol/L (ref 0–100)

## 2015-02-09 LAB — CBC
HCT: 24.7 % — ABNORMAL LOW (ref 36.0–46.0)
Hemoglobin: 8.4 g/dL — ABNORMAL LOW (ref 12.0–15.0)
MCH: 29.7 pg (ref 26.0–34.0)
MCHC: 34 g/dL (ref 30.0–36.0)
MCV: 87.3 fL (ref 78.0–100.0)
Platelets: 150 10*3/uL (ref 150–400)
RBC: 2.83 MIL/uL — ABNORMAL LOW (ref 3.87–5.11)
RDW: 15.3 % (ref 11.5–15.5)
WBC: 9.5 10*3/uL (ref 4.0–10.5)

## 2015-02-09 MED ORDER — POTASSIUM CHLORIDE 10 MEQ/50ML IV SOLN
10.0000 meq | INTRAVENOUS | Status: AC
  Administered 2015-02-09 (×3): 10 meq via INTRAVENOUS
  Filled 2015-02-09 (×3): qty 50

## 2015-02-09 MED ORDER — MIDAZOLAM HCL 2 MG/2ML IJ SOLN
2.0000 mg | INTRAMUSCULAR | Status: DC | PRN
  Administered 2015-02-10 – 2015-02-11 (×6): 2 mg via INTRAVENOUS
  Filled 2015-02-09 (×7): qty 2

## 2015-02-09 MED ORDER — ACETAMINOPHEN 160 MG/5ML PO SOLN
650.0000 mg | Freq: Four times a day (QID) | ORAL | Status: DC
  Administered 2015-02-09 – 2015-02-18 (×36): 650 mg via ORAL
  Filled 2015-02-09 (×36): qty 20.3

## 2015-02-09 MED ORDER — POTASSIUM CHLORIDE 10 MEQ/50ML IV SOLN
10.0000 meq | INTRAVENOUS | Status: AC
  Administered 2015-02-09 (×2): 10 meq via INTRAVENOUS
  Filled 2015-02-09 (×2): qty 50

## 2015-02-09 MED ORDER — TRACE MINERALS CR-CU-MN-SE-ZN 10-1000-500-60 MCG/ML IV SOLN
INTRAVENOUS | Status: AC
  Administered 2015-02-09: 18:00:00 via INTRAVENOUS
  Filled 2015-02-09: qty 1800

## 2015-02-09 MED ORDER — POTASSIUM CHLORIDE 10 MEQ/50ML IV SOLN
10.0000 meq | INTRAVENOUS | Status: AC | PRN
  Administered 2015-02-09 (×3): 10 meq via INTRAVENOUS
  Filled 2015-02-09 (×4): qty 50

## 2015-02-09 NOTE — Progress Notes (Signed)
PARENTERAL NUTRITION CONSULT NOTE - Follow Up  Pharmacy Consult for TPN Indication: Prolonged ileus  Allergies  Allergen Reactions  . Atorvastatin Other (See Comments)    Gas, chest tightness  . Crestor [Rosuvastatin Calcium] Other (See Comments)    Muscle aches   Patient Measurements: Height: 5' (152.4 cm) Weight: 118 lb 9.7 oz (53.8 kg) IBW/kg (Calculated) : 45.5  Usual Weight: ~100-106 pounds (46-48 kg)  Vital Signs: Temp: 100.2 F (37.9 C) (10/04 0700) Temp Source: Core (Comment) (10/04 0400) BP: 74/39 mmHg (10/04 0700) Pulse Rate: 106 (10/04 0700) Intake/Output from previous day: 10/03 0701 - 10/04 0700 In: 4568.4 [I.V.:2490.9; NG/GT:150; IV Piggyback:800; TPN:1127.5] Out: 5240 [Urine:4800; Blood:50; Chest Tube:390] Intake/Output from this shift:   Labs:  Recent Labs  02/08/15 0354 02/08/15 1759 02/09/15 0409  WBC 9.8 9.6 9.5  HGB 9.2* 9.1* 8.4*  HCT 27.3* 27.0* 24.7*  PLT 123* 136* 150    Recent Labs  02/06/15 1200 02/06/15 1638  02/07/15 0309 02/08/15 0354 02/08/15 1759 02/09/15 0409  NA  --   --   < > 128* 129* 126* 125*  K  --   --   < > 3.7 3.3* 3.0* 3.6  CL  --   --   < > 98* 92* 90* 89*  CO2  --   --   < > 21* $Rem'28 22 27  'VdQz$ GLUCOSE  --   --   < > 186* 144* 193* 149*  BUN  --   --   < > 22* 24* 24* 29*  CREATININE  --  1.13*  < > 1.08* 0.88 1.04* 0.92  CALCIUM  --   --   < > 7.8* 7.9* 7.9* 7.8*  MG  --  2.2  --  1.9 1.6*  --   --   PHOS  --   --   --  4.7* 4.0  --   --   PROT  --   --   --  4.8* 5.4*  --  5.1*  ALBUMIN  --   --   --  2.6* 2.3*  --  2.1*  AST  --   --   --  43* 44*  --  36  ALT  --   --   --  31 33  --  30  ALKPHOS  --   --   --  80 94  --  121  BILITOT  --   --   --  2.4* 2.6*  --  3.7*  PREALBUMIN  --   --   --  6.5* 6.6*  --   --   TRIG 148  --   --  132 131  --   --   < > = values in this interval not displayed. Estimated Creatinine Clearance: 45.5 mL/min (by C-G formula based on Cr of 0.92).   Recent Labs   02/08/15 1958 02/09/15 0007 02/09/15 0340  GLUCAP 182* 183* 147*   Medical History: Past Medical History  Diagnosis Date  . Hyperlipidemia   . Acute systolic CHF (congestive heart failure), NYHA class 3 ([place]) 07/10/2014  . Hypertension    Medications:  Infusions:  . Marland KitchenTPN (CLINIMIX-E) Adult 75 mL/hr at 02/08/15 1800  . sodium chloride 20 mL/hr (02/08/15 1815)  . sodium chloride    . dexmedetomidine 0.5 mcg/kg/hr (02/09/15 7829)  . dextrose Stopped (02/08/15 1800)  . epinephrine Stopped (02/06/15 1300)  . furosemide (LASIX) infusion 8 mg/hr (02/08/15 1800)  . milrinone 0.3 mcg/kg/min (  02/08/15 2014)  . norepinephrine (LEVOPHED) Adult infusion 5 mcg/min (02/09/15 0300)   Admit:  64 yo female with cardiac problems and recent CABG on 9/26 and Centrimag removal on 9/30 with an open sternum and plans for return to OR on Monday.  She was in her usual state of health prior to admit with a weight range of 100-105 pounds.  She has some fluid gain and her weight currently is ~ 133 pounds.  Surgeries/Procedures: S/p CABG x4, TEE, Removal of CVAD  Insulin Requirements in the past 24 hours:  28 units of SSI  Current Nutrition:  Currently NPO Clinimix E 5/15 @ 60ml/hr (Provides 90g of protein and 1278 kcal meeting 100% of protein and kcal needs) Propofol @ 4mcg/kg/min (This provides ~175 kcal)  Nutritional Goals: per RD recs on 10/1 1270 kCal > 90 gm of protein  GI: Post-op ileus, no bowel sounds, no stools, has little Emesis/NG output, chest tube output decreased to 333ml yesterday. PPI via NG tube. Albumin low at 2.1. Prealbumin low at 6.6.  Endo: CBGs slightly elevated (140s-190s)  Lytes: Na low at 125, K low at 3.6 (goal > 4) replaced. CoCa = 9.2. Phos ok and Mg low at 1.6 (goal > 2) replaced. Ca x Phos < 55.  Renal: SCr 0.92, CrCl ~73ml/min. Excellent diuresis (3.25ml/kg/hr) for past 2 days On lasix gtt at $Remo'8mg'HFCrd$ /hr. Has NS and 1/2NS running at 28ml/hr  Pulm: Remains intubated  and sedated with precedex  Cards: S/p CABG x 4 - placement of CAVD (and removed). S/p sternum closure yesterday. BP soft (on pressors) and HR tachy. Levo, Milrinone, and lasix infusing  Hepatobil: LFTs wnl. Alk Phos WNL.  T. Bili slightly elevated abd trending up to 3.7 (no jaundice noted)  HEME: Post-op anemia and thrombocytopenia - stable (9.9/28.4, 105K)  Neuro: Sedated - Now off propofol and on precedex. Trig stable at 131. On morphine gtt  ID: No current abx. Tmax to 100.8 over last 24 hours, currently afebrile. WBC down to wnl. Wound and blood cultures NGTD  Best Practices: SCD's  TPN Access:  PICC - placed 9/28  TPN start date: 10/1 >>  Plan:  Continue Clinimix E 5/15 at 75 ml/hr (Provides 90g of protein and 1278 kcal meeting 100% of protein and kcal needs) Hold IVFE for first 7 days for ICU patients per ASPEN guidelines (Day 4) Continue MVI and trace elements in TPN Add Pepcid $RemoveBe'20mg'[id number]$  to TPN Increase regular insulin to 30 units in TPN Continue SSI and adjust as needed Monitor TPN labs  Give K 21mEq IV x 3 runs per RN protocol  Elenor Quinones, PharmD Clinical Pharmacist Pager [phone] 02/09/2015 7:52 AM

## 2015-02-09 NOTE — Progress Notes (Signed)
Sputum specimen obtained and sent down to main lab without any complications.  

## 2015-02-09 NOTE — Progress Notes (Signed)
Advanced Heart Failure Rounding Note   Subjective:    S/p CABG with Centrimag placement 02/01/15  Centrimag explanted 9/30.   Sternal Closure 10/3.    Remains intubated. Awake on vent despite Precedex. Will wiggle toes and close eyes on command but not moving arms particularly R arm   Remains on milrinone 0.3, levophed 5 mcg, and lasix drip at 4 mg per hour.    PA 23/11  Thermo CO/CI 5.4/3.9    Weight down to 128-> 118 (baseline 100) Lasix turned down to 4  Objective:   Weight Range:  Vital Signs:   Temp:  [99 F (37.2 C)-100.4 F (38 C)] 99 F (37.2 C) (10/04 1400) Pulse Rate:  [95-124] 95 (10/04 1400) Resp:  [0-32] 15 (10/04 1400) BP: (74-165)/(37-68) 92/38 mmHg (10/04 1400) SpO2:  [93 %-100 %] 100 % (10/04 1400) Arterial Line BP: (84-243)/(34-83) 93/41 mmHg (10/04 1400) FiO2 (%):  [40 %] 40 % (10/04 1131) Weight:  [118 lb 9.7 oz (53.8 kg)] 118 lb 9.7 oz (53.8 kg) (10/04 0500) Last BM Date: 01/31/15  Weight change: Filed Weights   02/07/15 0700 02/08/15 0600 02/09/15 0500  Weight: 121 lb 4.1 oz (55 kg) 117 lb 15.1 oz (53.5 kg) 118 lb 9.7 oz (53.8 kg)    Intake/Output:   Intake/Output Summary (Last 24 hours) at 02/09/15 1521 Last data filed at 02/09/15 1400  Gross per 24 hour  Intake 4495.81 ml  Output   5325 ml  Net -829.19 ml     Physical Exam:  General:  Intubated wiggles toes and closes eyes on command  Wont move arms HEENT: normal x ETT Neck: supple. RIJ swan JVP ~10  Cor: Chest with dressing. Tachy regular Lungs: clear Abdomen: soft, nontender, nondistended. Hypoactive bowel sounds. Extremities: no cyanosis, clubbing, rash,  R and L thigh 1- 2+ edema Neuro: awake on vent    Telemetry: Sinus tach 110-120  Labs: Basic Metabolic Panel:  Recent Labs Lab 02/05/15 0400  02/06/15 0421 02/06/15 1638 02/06/15 1647 02/07/15 0309 02/08/15 0354 02/08/15 1759 02/09/15 0409  NA 135  < > 136  --  132* 128* 129* 126* 125*  K 3.8  < > 4.6  --   3.6 3.7 3.3* 3.0* 3.6  CL 105  < > 104  --  98* 98* 92* 90* 89*  CO2 22  --  19*  --   --  21* GLUCOSE 114*  < > 136*  --  108* 186* 144* 193* 149*  BUN 22*  < > 22*  --  21* 22* 24* 24* 29*  CREATININE 1.26*  < > 1.24* 1.13* 1.20* 1.08* 0.88 1.04* 0.92  CALCIUM 8.3*  --  8.5*  --   --  7.8* 7.9* 7.9* 7.8*  MG 2.0  --  3.0* 2.2  --  1.9 1.6*  --   --   PHOS  --   --   --   --   --  4.7* 4.0  --   --   < > = values in this interval not displayed.  Liver Function Tests:  Recent Labs Lab 02/05/15 0400 02/06/15 0421 02/07/15 0309 02/08/15 0354 02/09/15 0409  AST 57* 65* 43* 44* 36  ALT 33 30  ALKPHOS 55 62 80 94 121  BILITOT 1.6* 2.6* 2.4* 2.6* 3.7*  PROT 5.2* 5.0* 4.8* 5.4* 5.1*  ALBUMIN 3.6 3.1* 2.6* 2.3* 2.1*   No results for input(s): LIPASE, AMYLASE in  the last 168 hours. No results for input(s): AMMONIA in the last 168 hours.  CBC:  Recent Labs Lab 02/06/15 1638 02/06/15 1647 02/07/15 0309 02/08/15 0354 02/08/15 1759 02/09/15 0409  WBC 13.6*  --  13.9* 9.8 9.6 9.5  NEUTROABS  --   --  10.0* 6.6  --   --   HGB 9.4* 9.9* 9.9* 9.2* 9.1* 8.4*  HCT 27.6* 29.0* 28.4* 27.3* 27.0* 24.7*  MCV 85.2  --  86.6 86.9 86.3 87.3  PLT 79*  --  105* 123* 136* 150    Cardiac Enzymes: No results for input(s): CKTOTAL, CKMB, CKMBINDEX, TROPONINI in the last 168 hours.  BNP: BNP (last 3 results)  Recent Labs  07/10/14 1315  BNP 1059.1*    ProBNP (last 3 results) No results for input(s): PROBNP in the last 8760 hours.    Other results:  Imaging: Dg Chest Port 1 View  02/09/2015   CLINICAL DATA:  Status post CABG and subsequent left ventricular assist device placement and removal  EXAM: PORTABLE CHEST 1 VIEW  COMPARISON:  Portable chest x-ray of February 08, 2015  FINDINGS: The lungs are adequately inflated. The interstitial markings bilaterally are less conspicuous today. On the right the chest tube is unchanged with its tip projecting over the  posterior lateral aspect of the fourth rib. On the left the upper chest tube is stable with the tip projecting over the lateral aspect of the fourth and fifth rib interspace. The lower chest tube tip projects over the medial aspect of the ninth and tenth rib interspace. There is minimal left lower lobe atelectasis remaining as well as a trace of pleural fluid at the left lung base. There is no pneumothorax.  The mediastinal drain is unchanged lying to the left of midline. The cardiac silhouette remains mildly enlarged. The pulmonary vascularity is less engorged today. The Swan-Ganz catheter tip projects in the distal main pulmonary artery. The endotracheal tube tip lies 4.3 cm above the carina. The esophagogastric tube tip projects below the inferior margin of the image. There are 7 intact sternal wires.  IMPRESSION: Ongoing improvement in the appearance of the pulmonary interstitium with decreasing pulmonary interstitial edema. There is no large pleural effusion and no pneumothorax. The support tubes are in reasonable position.   Electronically Signed   By: David  [place] M.D.   On: 02/09/2015 07:37   Dg Chest Portable 1 View  02/08/2015   CLINICAL DATA:  Postop coronary bypass  EXAM: PORTABLE CHEST 1 VIEW  COMPARISON:  02/08/2015  FINDINGS: Endotracheal tube at the T2 level approximately 8 cm above the carina. Swan-Ganz catheter remains in the central pulmonary outflow tract from a right IJ approach. NG tube within the stomach. Mediastinal drains and bilateral chest tubes remain. Stable cardiomegaly with mild central vascular congestion and left base atelectasis. No developing effusion or pneumothorax.  IMPRESSION: Stable postoperative findings. Mild cardiomegaly with vascular congestion and basilar atelectasis.  No developing effusion or pneumothorax.   Electronically Signed   By: Judie Petit.  Shick M.D.   On: 02/08/2015 17:39   Dg Chest Port 1 View  02/08/2015   CLINICAL DATA:  Atelectasis.  EXAM: PORTABLE CHEST 1  VIEW  COMPARISON:  02/07/2015.  FINDINGS: Endotracheal tube, NG tube, right chest tube, mediastinal drainage tubes and left PICC line stable position. The tip of the right PICC line in slightly coiled in the region of the superior vena cava and unchanged. Prior CABG. Stable cardiomegaly. Interim near complete clearing of pulmonary interstitial prominence.  Mild bibasilar atelectasis. Tiny left pleural effusion cannot be excluded .  IMPRESSION: 1. Lines and tubes in stable position as above. 2. Prior CABG. Stable cardiomegaly. Interim near complete clearing of pulmonary interstitial prominence and small left pleural effusion.   Electronically Signed   By: Maisie Fus  Register   On: 02/08/2015 07:52     Medications:     Scheduled Medications: . sodium chloride   Intravenous Once  . acetaminophen (TYLENOL) oral liquid 160 mg/5 mL  650 mg Oral Q6H  . antiseptic oral rinse  7 mL Mouth Rinse q12n4p  . aspirin EC  325 mg Oral Daily   Or  . aspirin  324 mg Per Tube Daily  . cefUROXime (ZINACEF)  IV  1.5 g Intravenous Q12H  . chlorhexidine  15 mL Mouth Rinse BID  . enoxaparin (LOVENOX) injection  40 mg Subcutaneous Q24H  . insulin aspart  0-24 Units Subcutaneous 6 times per day  . metoCLOPramide (REGLAN) injection  10 mg Intravenous 4 times per day  . mupirocin ointment  1 application Topical BID  . pantoprazole sodium  40 mg Per Tube Daily  . sodium chloride  10-40 mL Intracatheter Q12H  . sodium chloride  3 mL Intravenous Q12H  . sodium chloride  3 mL Intravenous Q12H  . vancomycin  500 mg Intravenous Q12H    Infusions: . Marland KitchenTPN (CLINIMIX-E) Adult 75 mL/hr at 02/09/15 1400  . Marland KitchenTPN (CLINIMIX-E) Adult    . sodium chloride 20 mL/hr at 02/09/15 1400  . sodium chloride    . dexmedetomidine 0.3 mcg/kg/hr (02/09/15 1400)  . dextrose Stopped (02/08/15 1800)  . epinephrine Stopped (02/06/15 1300)  . furosemide (LASIX) infusion 4 mg/hr (02/09/15 1400)  . milrinone 0.3 mcg/kg/min (02/09/15 1400)  .  norepinephrine (LEVOPHED) Adult infusion 5 mcg/min (02/09/15 1400)    PRN Medications: sodium chloride, metoprolol, morphine injection, ondansetron (ZOFRAN) IV, sodium chloride, sodium chloride, sodium chloride   Assessment:   1. Cardiogenic shock   --s/p CABG with centrimag placement 02/01/15. Centrimag removed 9/30 2. CAD s/p CABG 3. 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 Acute respiratory failure 5. Acute blood loss anemia 6. Thrombocytopenia: HIT negative by SRA 7. Hyponatremia 8. Hypokalemia  Plan/Discussion:     Remains of lasix drip, norepi, and milrinone. Hemodynamics ok. CO-OX marginal. Volume status remains elevated. May need to increase lasix drip as urine output is slowing.   Length of Stay: 14  CLEGG,AMY NP-C  02/09/2015, 3:21 PM  Advanced Heart Failure Team Pager [phone] (M-F; 7a - 4p)  Please contact [facility] for night-coverage after hours (4p -7a ) and weekends on [email or web address]  Patient seen and examined with Tonye Becket, NP. We discussed all aspects of the encounter. I agree with the assessment and plan as stated above.   Improving but still very tenuous. She is becoming more responsive but I am worried about her not moving her arms - particularly her R arm. May be worth getting head CT. Wean inotropes as tolerated. Continue diuresis. Can pull swan.   The patient is critically ill with multiple organ systems failure and requires high complexity decision making for assessment and support, frequent evaluation and titration of therapies, application of advanced monitoring technologies and extensive interpretation of multiple databases.   Critical Care Time devoted to patient care services described in this note is 35 Minutes.   Damontae Loppnow,MD 5:14 PM

## 2015-02-09 NOTE — Op Note (Signed)
NAMEALYHA, MARINES NO.:  [id number]  MEDICAL RECORD NO.:  [id number]  LOCATION:  2S08C                        FACILITY:  [facility]  PHYSICIAN:  Kerin Perna, M.D.  DATE OF BIRTH:  10-17-1950  DATE OF PROCEDURE:  02/08/2015 DATE OF DISCHARGE:                              OPERATIVE REPORT   OPERATION: 1. Sternal excisional debridement and irrigation. 2. Sternal closure.  SURGEON:  Kerin Perna, M.D.  ANESTHESIA:  General.  PREOPERATIVE DIAGNOSIS:  Status post coronary artery bypass grafting with temporary percutaneous CentriMag left ventricular assist device with sternal edges left open for right ventricular dysfunction, but skin closed.  POSTOPERATIVE DIAGNOSIS:  Status post coronary artery bypass grafting with temporary percutaneous CentriMag left ventricular assist device with sternal edges left open for right ventricular dysfunction, but skin closed.  CLINICAL NOTE:  The patient had the percutaneous CentriMag LVAD removed 72 hours previously and has remained hemodynamically stable and has had excellent diuresis.  She is planning to return to the OR for sternal wiring and sternal closure today.  DESCRIPTION OF PROCEDURE:  The patient was brought directly from the ICU to the operating room after informed consent had been obtained, and preoperative antibiotics administered.  She was placed supine on the operating room table and remained stable.  A transesophageal echo probe showed her EF to be approximately 50% with 1+ TR and trace MR.  There is LV apical hypokinesia.  The patient's chest was prepped and draped as a sterile field.  A proper time-out was performed.  The previously placed skin staples were removed.  The sternal incision was opened.  There was minimal amount of clotted and non-clotted blood in the anterior mediastinum.  The wound was irrigated with vancomycin irrigation.  Next, sternal wires were placed in the usual fashion and the  sternum was closed.  The patient remained stable.  The echo remained unchanged.  The pectoralis fascia was closed with interrupted #1 Vicryl.  The subcutaneous layer was closed in running Vicryl and skin layer was closed with a skin stapler.  Aquacel wound dressing was placed.  The patient returned to the ICU.  Blood loss was minimal.     Kerin Perna, M.D.     PV/MEDQ  D:  02/08/2015  T:  02/09/2015  Job:  [id number]

## 2015-02-09 NOTE — Progress Notes (Addendum)
      [address]       [place] [address]             [phone]      1 Day Post-Op Procedure(s) (LRB): STERNAL CLOSURE (N/A) TRANSESOPHAGEAL ECHOCARDIOGRAM (TEE) (N/A)   Subjective:  Remains intubated, opens eyes, does not follow commands  Objective: Vital signs in last 24 hours: Temp:  [99.1 F (37.3 C)-100.4 F (38 C)] 99.9 F (37.7 C) (10/04 0900) Pulse Rate:  [96-128] 105 (10/04 0900) Cardiac Rhythm:  [-] Sinus tachycardia (10/04 0800) Resp:  [0-29] 22 (10/04 0900) BP: (74-136)/(39-62) 93/62 mmHg (10/04 0900) SpO2:  [98 %-100 %] 100 % (10/04 0900) Arterial Line BP: (84-243)/(34-61) 128/44 mmHg (10/04 0900) FiO2 (%):  [40 %] 40 % (10/04 0800) Weight:  [118 lb 9.7 oz (53.8 kg)] 118 lb 9.7 oz (53.8 kg) (10/04 0500)  Hemodynamic parameters for last 24 hours: PAP: (16-49)/(8-26) 28/15 mmHg CO:  [4 L/min-5.5 L/min] 4 L/min CI:  [2.9 L/min/m2-3.9 L/min/m2] 3.3 L/min/m2  Intake/Output from previous day: 10/03 0701 - 10/04 0700 In: 4568.4 [I.V.:2490.9; NG/GT:150; IV Piggyback:800; TPN:1127.5] Out: 5240 [Urine:4800; Blood:50; Chest Tube:390] Intake/Output this shift: Total I/O In: 318.6 [I.V.:43.6; IV Piggyback:200; TPN:75] Out: 305 [Urine:245; Chest Tube:60]  General appearance: alert, cooperative and no distress Heart: regular rate and rhythm Lungs: clear to auscultation bilaterally Abdomen: soft, non-tender; bowel sounds normal; no masses,  no organomegaly Extremities: edema 1-2+ Wound: clean and dry  Lab Results:  Recent Labs  02/08/15 1759 02/09/15 0409  WBC 9.6 9.5  HGB 9.1* 8.4*  HCT 27.0* 24.7*  PLT 136* 150   BMET:  Recent Labs  02/08/15 1759 02/09/15 0409  NA 126* 125*  K 3.0* 3.6  CL 90* 89*  CO2 22 27  GLUCOSE 193* 149*  BUN 24* 29*  CREATININE 1.04* 0.92  CALCIUM 7.9* 7.8*    PT/INR: No results for input(s): LABPROT, INR in the last 72 hours. ABG    Component Value Date/Time   PHART 7.473* 02/09/2015 0410   HCO3 28.6* 02/09/2015 0410   TCO2 29.8 02/09/2015 0410   ACIDBASEDEF 6.0* 02/07/2015 0245   O2SAT 56.2 02/09/2015 0415   CBG (last 3)   Recent Labs  02/09/15 0007 02/09/15 0340 02/09/15 0848  GLUCAP 183* 147* 138*    Assessment/Plan: S/P Procedure(s) (LRB): STERNAL CLOSURE (N/A) TRANSESOPHAGEAL ECHOCARDIOGRAM (TEE) (N/A)  1. CV- stable on Milrinone, Levophed wean as tolerated 2. Pulm- wean to extubate as tolerated, CXR improving, respiratory culture obtained 3. Renal- creatinine stable remains hypervolemic, continue Lasix gtt 4. Expected post operative blood loss anemia- Hgb at 8.4 will monitor 5. Cbgs controlled, not a diabetic, continue SSIP for now 6. Dispo- patient stable, chest closed yesterday, weaning to extubate as tolerated,   LOS: 14 days    [name], [name] 02/09/2015  patient examined and medical record reviewed,agree with above note. Kathlee Nations Trigt III 02/10/2015

## 2015-02-09 NOTE — Progress Notes (Signed)
TCTS BRIEF SICU PROGRESS NOTE  R8 Days Post-Op Procedure(s) (LRB): CORONARY ARTERY BYPASS GRAFTING (CABG) X 4 UTILIZING THE LEFT INTERNAL MAMMARY ARTERY TO LAD, ENDOSCOPICALLY HARVESTED BILATERAL SAPHENEOUS VEIN GRAFTS TO DIAGONAL, OM AND PD. (N/A) TRANSESOPHAGEAL ECHOCARDIOGRAM (TEE) (N/A) PLACEMENT OF CENTRIMAG VENTRICULAR ASSIST DEVICE (N/A)  4 Days Post-Op S/P Procedure(s) (LRB): REMOVAL OF CENTRIMAG VENTRICULAR ASSIST DEVICE (N/A) CANNULATION FOR CARDIOPULMONARY BYPASS (N/A) TRANSESOPHAGEAL ECHOCARDIOGRAM (TEE) (N/A)   1 Day Post-Op  S/P Procedure(s) (LRB): STERNAL CLOSURE (N/A) TRANSESOPHAGEAL ECHOCARDIOGRAM (TEE) (N/A)   Stable day Sedated on vent, intermittently following some commands NSR w/ stable hemodynamics on levophed @ 8 and milrinone @ 0.3 Diuresing well  Plan: Continue current plan  Purcell Nails, MD 02/09/2015 7:07 PM

## 2015-02-09 NOTE — Progress Notes (Signed)
Chest tube removal charted as completed, done by previous RN.

## 2015-02-10 ENCOUNTER — Inpatient Hospital Stay (HOSPITAL_COMMUNITY): Payer: Medicaid Other

## 2015-02-10 LAB — CARBOXYHEMOGLOBIN
Carboxyhemoglobin: 1.7 % — ABNORMAL HIGH (ref 0.5–1.5)
Methemoglobin: 0.8 % (ref 0.0–1.5)
O2 Saturation: 66.9 %
Total hemoglobin: 12.6 g/dL (ref 12.0–16.0)

## 2015-02-10 LAB — CBC
HCT: 25.8 % — ABNORMAL LOW (ref 36.0–46.0)
Hemoglobin: 8.8 g/dL — ABNORMAL LOW (ref 12.0–15.0)
MCH: 29.5 pg (ref 26.0–34.0)
MCHC: 34.1 g/dL (ref 30.0–36.0)
MCV: 86.6 fL (ref 78.0–100.0)
Platelets: 229 10*3/uL (ref 150–400)
RBC: 2.98 MIL/uL — ABNORMAL LOW (ref 3.87–5.11)
RDW: 15.3 % (ref 11.5–15.5)
WBC: 12.6 10*3/uL — ABNORMAL HIGH (ref 4.0–10.5)

## 2015-02-10 LAB — POCT I-STAT 3, ART BLOOD GAS (G3+)
Acid-Base Excess: 10 mmol/L — ABNORMAL HIGH (ref 0.0–2.0)
BICARBONATE: 33.8 meq/L — AB (ref 20.0–24.0)
O2 Saturation: 99 %
PCO2 ART: 42.3 mmHg (ref 35.0–45.0)
PH ART: 7.51 — AB (ref 7.350–7.450)
PO2 ART: 122 mmHg — AB (ref 80.0–100.0)
TCO2: 35 mmol/L (ref 0–100)

## 2015-02-10 LAB — COMPREHENSIVE METABOLIC PANEL
ALT: 31 U/L (ref 14–54)
AST: 38 U/L (ref 15–41)
Albumin: 2.1 g/dL — ABNORMAL LOW (ref 3.5–5.0)
Alkaline Phosphatase: 126 U/L (ref 38–126)
Anion gap: 8 (ref 5–15)
BUN: 36 mg/dL — ABNORMAL HIGH (ref 6–20)
CO2: 28 mmol/L (ref 22–32)
Calcium: 8.1 mg/dL — ABNORMAL LOW (ref 8.9–10.3)
Chloride: 91 mmol/L — ABNORMAL LOW (ref 101–111)
Creatinine, Ser: 1.03 mg/dL — ABNORMAL HIGH (ref 0.44–1.00)
GFR calc Af Amer: >60 mL/min (ref >60)
GFR calc non Af Amer: 57 mL/min — ABNORMAL LOW (ref >60)
Glucose, Bld: 168 mg/dL — ABNORMAL HIGH (ref 65–99)
Potassium: 3.3 mmol/L — ABNORMAL LOW (ref 3.5–5.1)
Sodium: 127 mmol/L — ABNORMAL LOW (ref 135–145)
Total Bilirubin: 3.9 mg/dL — ABNORMAL HIGH (ref 0.3–1.2)
Total Protein: 5.7 g/dL — ABNORMAL LOW (ref 6.5–8.1)

## 2015-02-10 LAB — POCT I-STAT, CHEM 8
BUN: 44 mg/dL — ABNORMAL HIGH (ref 6–20)
CHLORIDE: 90 mmol/L — AB (ref 101–111)
Calcium, Ion: 1.16 mmol/L (ref 1.13–1.30)
Creatinine, Ser: 1 mg/dL (ref 0.44–1.00)
Glucose, Bld: 175 mg/dL — ABNORMAL HIGH (ref 65–99)
HEMATOCRIT: 26 % — AB (ref 36.0–46.0)
HEMOGLOBIN: 8.8 g/dL — AB (ref 12.0–15.0)
POTASSIUM: 3.7 mmol/L (ref 3.5–5.1)
SODIUM: 130 mmol/L — AB (ref 135–145)
TCO2: 29 mmol/L (ref 0–100)

## 2015-02-10 LAB — GLUCOSE, CAPILLARY
Glucose-Capillary: 155 mg/dL — ABNORMAL HIGH (ref 65–99)
Glucose-Capillary: 181 mg/dL — ABNORMAL HIGH (ref 65–99)
Glucose-Capillary: 162 mg/dL — ABNORMAL HIGH (ref 65–99)
Glucose-Capillary: 147 mg/dL — ABNORMAL HIGH (ref 65–99)
Glucose-Capillary: 111 mg/dL — ABNORMAL HIGH (ref 65–99)

## 2015-02-10 LAB — VANCOMYCIN, TROUGH: Vancomycin Tr: 24 ug/mL — ABNORMAL HIGH (ref 10.0–20.0)

## 2015-02-10 MED ORDER — IOHEXOL 300 MG/ML  SOLN
50.0000 mL | Freq: Once | INTRAMUSCULAR | Status: DC | PRN
  Administered 2015-02-10: 20 mL via INTRAVENOUS
  Filled 2015-02-10: qty 50

## 2015-02-10 MED ORDER — POTASSIUM CHLORIDE 10 MEQ/50ML IV SOLN
10.0000 meq | INTRAVENOUS | Status: AC
  Administered 2015-02-10 (×3): 10 meq via INTRAVENOUS
  Filled 2015-02-10 (×3): qty 50

## 2015-02-10 MED ORDER — HYDROMORPHONE HCL 1 MG/ML IJ SOLN
1.0000 mg | INTRAMUSCULAR | Status: DC | PRN
Start: 2015-02-10 — End: 2015-02-11
  Administered 2015-02-10 – 2015-02-11 (×6): 1 mg via INTRAVENOUS
  Filled 2015-02-10 (×6): qty 1

## 2015-02-10 MED ORDER — VANCOMYCIN HCL 500 MG IV SOLR
500.0000 mg | INTRAVENOUS | Status: DC
  Administered 2015-02-10 – 2015-02-14 (×5): 500 mg via INTRAVENOUS
  Filled 2015-02-10 (×6): qty 500

## 2015-02-10 MED ORDER — VITAL 1.5 CAL PO LIQD
1000.0000 mL | ORAL | Status: DC
  Administered 2015-02-11: 1000 mL
  Filled 2015-02-10 (×2): qty 1000

## 2015-02-10 MED ORDER — LIDOCAINE VISCOUS 2 % MT SOLN
OROMUCOSAL | Status: AC
  Filled 2015-02-10: qty 15

## 2015-02-10 MED ORDER — POTASSIUM CHLORIDE 10 MEQ/50ML IV SOLN
10.0000 meq | INTRAVENOUS | Status: AC
  Administered 2015-02-10 (×2): 10 meq via INTRAVENOUS
  Filled 2015-02-10 (×2): qty 50

## 2015-02-10 MED ORDER — ALBUTEROL SULFATE (2.5 MG/3ML) 0.083% IN NEBU: 2.5000 mg | INHALATION_SOLUTION | RESPIRATORY_TRACT | Status: DC | PRN

## 2015-02-10 MED ORDER — CLINIMIX E/DEXTROSE (5/15) 5 % IV SOLN
INTRAVENOUS | Status: DC
  Administered 2015-02-10: 17:00:00 via INTRAVENOUS
  Filled 2015-02-10: qty 1800

## 2015-02-10 NOTE — Progress Notes (Signed)
RT note-placed back to full support, increased RR again, coughing, moderate amount thin clear secretions.

## 2015-02-10 NOTE — Progress Notes (Signed)
CT surgery p.m. Rounds  Patient remaining in sinus rhythm hemodynamically stable Appreciate critical care input on vent wean-patient tolerated 1 hour of pressure support weaning today We'll start enteric tube feeds and wean T NA

## 2015-02-10 NOTE — Progress Notes (Signed)
PARENTERAL NUTRITION CONSULT NOTE - Follow Up  Pharmacy Consult for TPN Indication: Prolonged ileus  Allergies  Allergen Reactions  . Atorvastatin Other (See Comments)    Gas, chest tightness  . Crestor [Rosuvastatin Calcium] Other (See Comments)    Muscle aches   Patient Measurements: Height: 5' (152.4 cm) Weight: 114 lb 6.7 oz (51.9 kg) IBW/kg (Calculated) : 45.5  Usual Weight: ~100-106 pounds (46-48 kg)  Vital Signs: Temp: 97.9 F (36.6 C) (10/05 0700) Temp Source: Core (Comment) (10/05 0400) BP: 103/48 mmHg (10/05 0700) Pulse Rate: 95 (10/05 0700) Intake/Output from previous day: 10/04 0701 - 10/05 0700 In: 3923.3 [I.V.:1388.3; NG/GT:30; IV Piggyback:700; TPN:1805] Out: 4290 [Urine:4030; Emesis/NG output:40; Chest Tube:220] Intake/Output from this shift:   Labs:  Recent Labs  02/08/15 1759 02/09/15 0409 02/09/15 1651 02/10/15 0416  WBC 9.6 9.5  --  12.6*  HGB 9.1* 8.4* 8.5* 8.8*  HCT 27.0* 24.7* 25.0* 25.8*  PLT 136* 150  --  229    Recent Labs  02/08/15 0354 02/08/15 1759 02/09/15 0409 02/09/15 1651 02/10/15 0416  NA 129* 126* 125* 128* 127*  K 3.3* 3.0* 3.6 3.5 3.3*  CL 92* 90* 89* 88* 91*  CO2 --  28  GLUCOSE 144* 193* 149* 165* 168*  BUN 24* 24* 29* 38* 36*  CREATININE 0.88 1.04* 0.92 1.10* 1.03*  CALCIUM 7.9* 7.9* 7.8*  --  8.1*  MG 1.6*  --   --   --   --   PHOS 4.0  --   --   --   --   PROT 5.4*  --  5.1*  --  5.7*  ALBUMIN 2.3*  --  2.1*  --  2.1*  AST 44*  --  36  --  38  ALT 33  --  30  --  31  ALKPHOS 94  --  121  --  126  BILITOT 2.6*  --  3.7*  --  3.9*  PREALBUMIN 6.6*  --   --   --   --   TRIG 131  --   --   --   --    Estimated Creatinine Clearance: 40.7 mL/min (by C-G formula based on Cr of 1.03).   Recent Labs  02/09/15 1935 02/09/15 2312 02/10/15 0352  GLUCAP 126* 176* 155*   Medical History: Past Medical History  Diagnosis Date  . Hyperlipidemia   . Acute systolic CHF (congestive heart failure), NYHA  class 3 (HCC) 07/10/2014  . Hypertension    Medications:  Infusions:  . Marland KitchenTPN (CLINIMIX-E) Adult 75 mL/hr at 02/09/15 1752  . sodium chloride 20 mL/hr at 02/10/15 0413  . sodium chloride    . dexmedetomidine 0.8 mcg/kg/hr (02/10/15 0413)  . dextrose Stopped (02/08/15 1800)  . epinephrine Stopped (02/06/15 1300)  . furosemide (LASIX) infusion 4 mg/hr (02/09/15 2020)  . milrinone 0.3 mcg/kg/min (02/09/15 1801)  . norepinephrine (LEVOPHED) Adult infusion 7 mcg/min (02/10/15 0415)   Admit:  64 yo female with cardiac problems and recent CABG on 9/26 and Centrimag removal on 9/30 with an open sternum and plans for return to OR on Monday.  She was in her usual state of health prior to admit with a weight range of 100-105 pounds.  She has some fluid gain and her weight on admission is ~ 133 pounds.  Surgeries/Procedures: S/p CABG x4, TEE, Removal of CVAD  Insulin Requirements in the past 24 hours:  28 units of SSI 30 units of regular insulin  in TPN  Current Nutrition:  Currently NPO Clinimix E 5/15 @ 32ml/hr (Provides 90g of protein and 1278 kcal meeting 100% of protein and kcal needs)  Nutritional Goals: per RD recs on 10/1 1270 kCal > 90 gm of protein  GI: Post-op ileus, no bowel sounds, no stools, has little Emesis/NG output, chest tube output decreased to yesterday. PPI via NG tube. Albumin low at 2.1. Prealbumin low at 6.6. Last BM was 10/4.  Endo: CBGs slightly elevated (120s-170s)  Lytes: Na low at 127, K low at 3.3 (goal > 4) replaced. CoCa = 9.5. Phos ok and Mg low at 1.6 (goal > 2) replaced. Ca x Phos < 55.  Renal: SCr 1.03, CrCl ~22ml/min. Excellent diuresis (3.57ml/kg/hr) for past 2 days On lasix gtt at /hr. Has 1/2NS running at 16ml/hr  Pulm: Remains intubated and sedated with precedex  Cards: S/p CABG x 4 - placement of CAVD (and removed). S/p sternum closure yesterday. BP soft (on pressors) and HR tachy. Levo, Milrinone, and lasix infusing  Hepatobil: LFTs  wnl. TBili slightly elevated and trending up to 3.9 (no jaundice noted)  HEME: Post-op anemia and thrombocytopenia - stable. Hgb 8.8, plts wnl  Neuro: Sedated - Now off propofol and on precedex. Trig stable at 131. On morphine gtt  ID: No current abx. Currently afebrile. WBC up to 12.6. Wound and blood cultures NGTD  Best Practices: SCD's  TPN Access: PICC - placed 9/28  TPN start date: 10/1 >>  Plan:  Continue Clinimix E 5/15 at 75 ml/hr (Provides 90g of protein and 1278 kcal meeting 100% of protein and kcal needs) Hold IVFE for first 7 days for ICU patients per ASPEN guidelines (Day 5) Continue MVI and trace elements in TPN Add Pepcid  to TPN Increase regular insulin to 40 units in TPN Continue SSI and adjust as needed Monitor TPN labs Follow up weaning of sedation and extubation and then advancement of diet  Enzo Bi, PharmD Clinical Pharmacist Pager [phone] 02/10/2015 7:39 AM

## 2015-02-10 NOTE — Clinical Documentation Improvement (Signed)
Cardiothoracic  Please further clarify procedure documentation in the medical record of "excisional debridement."   Type - excisional, non-excisional  Depth of - skin, subcutaneous tissue/fascia, muscle, joint, bone, etc,  Specify the type of instrument used, ex. scalpel, scissors, currette, etc.  Specify the appearance and size of the wound, ex. fresh bleeding, tissue, etc.  Specify the nature of tissue removed, ex. slough, necrotic, devitalized tissue, nonviable tissue, etc.  Other condition  Clinically Undetermined   Supporting Information: :  Sternal excisional debridement and irrigation per 10/04 Op note for surgical procedure of 10/03.   Please exercise your independent, professional judgment when responding. A specific answer is not anticipated or expected.   Thank Sabino Donovan [facility] [place] [phone]

## 2015-02-10 NOTE — Progress Notes (Signed)
Advanced Heart Failure Rounding Note   Subjective:    S/p CABG with Centrimag placement 02/01/15  Centrimag explanted 9/30.   Sternal Closure 10/3.    Remains intubated. Awake on vent despite Precedex. Will wiggle toes and close eyes on command but not moving arms particularly R arm   Remains on milrinone 0.3, levophed 5 mcg, and lasix drip at 4 mg per hour.   Weight down to 128-> 118>114  (baseline 100) . Objective:   Weight Range:  Vital Signs:   Temp:  [97.7 F (36.5 C)-100 F (37.8 C)] 97.9 F (36.6 C) (10/05 0800) Pulse Rate:  [93-121] 96 (10/05 0800) Resp:  [0-32] 15 (10/05 0800) BP: (80-165)/(37-82) 97/44 mmHg (10/05 0800) SpO2:  [93 %-100 %] 100 % (10/05 0800) Arterial Line BP: (89-192)/(40-83) 115/48 mmHg (10/05 0800) FiO2 (%):  [40 %] 40 % (10/05 0810) Weight:  [114 lb 6.7 oz (51.9 kg)] 114 lb 6.7 oz (51.9 kg) (10/05 0500) Last BM Date: 02/09/15  Weight change: Filed Weights   02/08/15 0600 02/09/15 0500 02/10/15 0500  Weight: 117 lb 15.1 oz (53.5 kg) 118 lb 9.7 oz (53.8 kg) 114 lb 6.7 oz (51.9 kg)    Intake/Output:   Intake/Output Summary (Last 24 hours) at 02/10/15 0917 Last data filed at 02/10/15 0800  Gross per 24 hour  Intake 3765.49 ml  Output   4360 ml  Net -594.51 ml     Physical Exam:  General:  Intubated wiggles toes and closes eyes on command  Wont move arms HEENT: normal x ETT Neck: supple. RIJ swan JVP Cor: Chest with dressing. Tachy regular Lungs: clear Abdomen: soft, nontender, nondistended. Hypoactive bowel sounds. Extremities: no cyanosis, clubbing, rash,  R and L thigh 1- 2+ edema Neuro: awake on vent    Telemetry: Sinus tach 110-120  Labs: Basic Metabolic Panel:  Recent Labs Lab 02/05/15 0400  02/06/15 0421 02/06/15 1638  02/07/15 0309 02/08/15 0354 02/08/15 1759 02/09/15 0409 02/09/15 1651 02/10/15 0416  NA 135  < > 136  --   < > 128* 129* 126* 125* 128* 127*  K 3.8  < > 4.6  --   < > 3.7 3.3* 3.0* 3.6 3.5  3.3*  CL 105  < > 104  --   < > 98* 92* 90* 89* 88* 91*  CO2 22  --  19*  --   --  21* 28 22 27   --  28  GLUCOSE 114*  < > 136*  --   < > 186* 144* 193* 149* 165* 168*  BUN 22*  < > 22*  --   < > 22* 24* 24* 29* 38* 36*  CREATININE 1.26*  < > 1.24* 1.13*  < > 1.08* 0.88 1.04* 0.92 1.10* 1.03*  CALCIUM 8.3*  --  8.5*  --   --  7.8* 7.9* 7.9* 7.8*  --  8.1*  MG 2.0  --  3.0* 2.2  --  1.9 1.6*  --   --   --   --   PHOS  --   --   --   --   --  4.7* 4.0  --   --   --   --   < > = values in this interval not displayed.  Liver Function Tests:  Recent Labs Lab 02/06/15 0421 02/07/15 0309 02/08/15 0354 02/09/15 0409 02/10/15 0416  AST 65* 43* 44* 36 38  ALT 27 31 33 30 31  ALKPHOS 62 80 94 121  126  BILITOT 2.6* 2.4* 2.6* 3.7* 3.9*  PROT 5.0* 4.8* 5.4* 5.1* 5.7*  ALBUMIN 3.1* 2.6* 2.3* 2.1* 2.1*   No results for input(s): LIPASE, AMYLASE in the last 168 hours. No results for input(s): AMMONIA in the last 168 hours.  CBC:  Recent Labs Lab 02/07/15 0309 02/08/15 0354 02/08/15 1759 02/09/15 0409 02/09/15 1651 02/10/15 0416  WBC 13.9* 9.8 9.6 9.5  --  12.6*  NEUTROABS 10.0* 6.6  --   --   --   --   HGB 9.9* 9.2* 9.1* 8.4* 8.5* 8.8*  HCT 28.4* 27.3* 27.0* 24.7* 25.0* 25.8*  MCV 86.6 86.9 86.3 87.3  --  86.6  PLT 105* 123* 136* 150  --  229    Cardiac Enzymes: No results for input(s): CKTOTAL, CKMB, CKMBINDEX, TROPONINI in the last 168 hours.  BNP: BNP (last 3 results)  Recent Labs  07/10/14 1315  BNP 1059.1*    ProBNP (last 3 results) No results for input(s): PROBNP in the last 8760 hours.    Other results:  Imaging: Dg Chest Port 1 View  02/10/2015   CLINICAL DATA:  Shortness of breath.  EXAM: PORTABLE CHEST 1 VIEW  COMPARISON:  02/09/2015.  FINDINGS: Endotracheal tube, NG tube, Swan-Ganz catheter, left PICC line, bilateral chest tubes in stable position. Interim removal of mediastinal drainage catheters. Prior CABG. Heart size stable. No pulmonary venous  congestion. Low lung volumes with mild basilar atelectasis. No pleural effusion or pneumothorax.  IMPRESSION: 1. Interim removal of mediastinal drainage catheters. Remaining lines and tubes including bilateral chest tubes in stable position. No pneumothorax. 2. Prior CABG.  Heart size stable.  No pulmonary venous congestion. 3. Low lung volumes.   Electronically Signed   By: Maisie Fus  Register   On: 02/10/2015 08:10   Dg Chest Port 1 View  02/09/2015   CLINICAL DATA:  Status post CABG and subsequent left ventricular assist device placement and removal  EXAM: PORTABLE CHEST 1 VIEW  COMPARISON:  Portable chest x-ray of February 08, 2015  FINDINGS: The lungs are adequately inflated. The interstitial markings bilaterally are less conspicuous today. On the right the chest tube is unchanged with its tip projecting over the posterior lateral aspect of the fourth rib. On the left the upper chest tube is stable with the tip projecting over the lateral aspect of the fourth and fifth rib interspace. The lower chest tube tip projects over the medial aspect of the ninth and tenth rib interspace. There is minimal left lower lobe atelectasis remaining as well as a trace of pleural fluid at the left lung base. There is no pneumothorax.  The mediastinal drain is unchanged lying to the left of midline. The cardiac silhouette remains mildly enlarged. The pulmonary vascularity is less engorged today. The Swan-Ganz catheter tip projects in the distal main pulmonary artery. The endotracheal tube tip lies 4.3 cm above the carina. The esophagogastric tube tip projects below the inferior margin of the image. There are 7 intact sternal wires.  IMPRESSION: Ongoing improvement in the appearance of the pulmonary interstitium with decreasing pulmonary interstitial edema. There is no large pleural effusion and no pneumothorax. The support tubes are in reasonable position.   Electronically Signed   By: David  [place] M.D.   On: 02/09/2015 07:37    Dg Chest Portable 1 View  02/08/2015   CLINICAL DATA:  Postop coronary bypass  EXAM: PORTABLE CHEST 1 VIEW  COMPARISON:  02/08/2015  FINDINGS: Endotracheal tube at the T2 level approximately 8 cm  above the carina. Swan-Ganz catheter remains in the central pulmonary outflow tract from a right IJ approach. NG tube within the stomach. Mediastinal drains and bilateral chest tubes remain. Stable cardiomegaly with mild central vascular congestion and left base atelectasis. No developing effusion or pneumothorax.  IMPRESSION: Stable postoperative findings. Mild cardiomegaly with vascular congestion and basilar atelectasis.  No developing effusion or pneumothorax.   Electronically Signed   By: Judie Petit.  Shick M.D.   On: 02/08/2015 17:39     Medications:     Scheduled Medications: . sodium chloride   Intravenous Once  . acetaminophen (TYLENOL) oral liquid 160 mg/5 mL  650 mg Oral Q6H  . antiseptic oral rinse  7 mL Mouth Rinse q12n4p  . aspirin EC  325 mg Oral Daily   Or  . aspirin  324 mg Per Tube Daily  . chlorhexidine  15 mL Mouth Rinse BID  . enoxaparin (LOVENOX) injection  40 mg Subcutaneous Q24H  . insulin aspart  0-24 Units Subcutaneous 6 times per day  . metoCLOPramide (REGLAN) injection  10 mg Intravenous 4 times per day  . mupirocin ointment  1 application Topical BID  . pantoprazole sodium  40 mg Per Tube Daily  . potassium chloride  10 mEq Intravenous Q1 Hr x 2  . sodium chloride  10-40 mL Intracatheter Q12H  . sodium chloride  3 mL Intravenous Q12H  . sodium chloride  3 mL Intravenous Q12H  . vancomycin  500 mg Intravenous Q24H    Infusions: . Marland KitchenTPN (CLINIMIX-E) Adult 75 mL/hr at 02/09/15 1752  . Marland KitchenTPN (CLINIMIX-E) Adult    . sodium chloride 20 mL/hr at 02/10/15 0413  . sodium chloride    . dexmedetomidine 0.5 mcg/kg/hr (02/10/15 0800)  . dextrose Stopped (02/08/15 1800)  . epinephrine Stopped (02/06/15 1300)  . furosemide (LASIX) infusion 4 mg/hr (02/09/15 2020)  . milrinone 0.3  mcg/kg/min (02/10/15 0800)  . norepinephrine (LEVOPHED) Adult infusion 7 mcg/min (02/10/15 0800)    PRN Medications: sodium chloride, HYDROmorphone (DILAUDID) injection, metoprolol, midazolam, morphine injection, ondansetron (ZOFRAN) IV, sodium chloride, sodium chloride, sodium chloride   Assessment:   1. Cardiogenic shock   --s/p CABG with centrimag placement 02/01/15. Centrimag removed 9/30 2. CAD s/p CABG 3. 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 Acute respiratory failure 5. Acute blood loss anemia 6. Thrombocytopenia: HIT negative by SRA 7. Hyponatremia 8. Hypokalemia  Plan/Discussion:     Remains of lasix drip, norepi, and milrinone. Hemodynamics ok. CO-OX 67%.     CCM following for vent.   Starting tube feeds today.   Length of Stay: 15  CLEGG,AMY NP-C  02/10/2015, 9:17 AM  Advanced Heart Failure Team Pager [phone] (M-F; 7a - 4p)  Please contact [facility] for night-coverage after hours (4p -7a ) and weekends on [email or web address]  Patient seen and examined with Tonye Becket, NP. We discussed all aspects of the encounter. I agree with the assessment and plan as stated above.   Remains critically ill but improving. Now moving all 4 extremities. Diuresing well. Co-ox 67%. Would continue inotropic support and lasix at current level. Agree with TFs. Hopefully she will continue to improve. Discussed with CCM team at bedside.    The patient is critically ill with multiple organ systems failure and requires high complexity decision making for assessment and support, frequent evaluation and titration of therapies, application of advanced monitoring technologies and extensive interpretation of multiple databases.   Critical Care Time devoted to patient care services described in this note is 35 Minutes.  Ryan Ogborn,MD 9:55 AM

## 2015-02-10 NOTE — Addendum Note (Signed)
Addendum  created 02/10/15 0981 by Kipp Brood, MD   Modules edited: Notes Section   Notes Section:  File: [id number]; Pend: [id number]; Pend: [id number]; Beverely Low: [id number]; Pend: [phone]

## 2015-02-10 NOTE — Consult Note (Signed)
PULMONARY / CRITICAL CARE MEDICINE   Name: Breanna Alvarez MRN: [id number] DOB: 03-02-1951    ADMISSION DATE:  01/26/2015 CONSULTATION DATE:  10/5  REFERRING MD :  Donata Clay   CHIEF COMPLAINT:  Vent management   INITIAL PRESENTATION: 63yo female never smoker with hx HTN, CAD previously refused CABG ultimately admitted 9/20 for CVTS eval and CABG after ongoing chest pain.  She underwent 4V CABG 9/26 and has had complicated course with cardiogenic shock s/p centrimag placement and removal, ischemic cardiomyopathy with EF 20%, open sternum now s/p sternal closure 10/3.  She remains intubated since initial CABG 9/26 with poor weaning and PCCM now consulted to assist.   STUDIES:  9/21 2D echo>> EF 20-25%, mod MR, PA press 51 mmHg 9/30 TEE>>> EF 30-35%, hypokinesis of apical myocardium, akinesis of anteroseptal myocardium, mild TR, improved LV function    SIGNIFICANT EVENTS: 9/26 CABG x 4, placement of centrimag L ventricular assistive device  9/30 removal centrimag  10/3 sternal closure   HISTORY OF PRESENT ILLNESS:  63yo female never smoker with hx HTN, CAD previously refused CABG ultimately admitted 9/20 for CVTS eval and CABG after ongoing chest pain.  She underwent 4V CABG 9/26 and has had complicated course with cardiogenic shock s/p centrimag placement and removal, ischemic cardiomyopathy with EF 20%, open sternum now s/p sternal closure 10/3.  She remains intubated since initial CABG 9/26 with poor weaning and PCCM now consulted to assist.    PAST MEDICAL HISTORY :   has a past medical history of Hyperlipidemia; Acute systolic CHF (congestive heart failure), NYHA class 3 (HCC) (07/10/2014); and Hypertension.  has past surgical history that includes No past surgeries; left heart catheterization with coronary angiogram (N/A, 07/13/2014); Coronary artery bypass graft (N/A, 02/01/2015); TEE without cardioversion (N/A, 02/01/2015); Placement of centrimag ventricular assist device (N/A,  02/01/2015); Removal of centrimag ventricular assist device (N/A, 02/05/2015); Cannulation for cardiopulmonary bypass (N/A, 02/05/2015); TEE without cardioversion (N/A, 02/05/2015); Sternal closure (N/A, 02/08/2015); and TEE without cardioversion (N/A, 02/08/2015). Prior to Admission medications   Medication Sig Start Date End Date Taking? Authorizing Provider  aspirin 81 MG chewable tablet Chew 1 tablet (81 mg total) by mouth daily. 07/15/14  Yes Luke K Kilroy, PA-C  isosorbide mononitrate (IMDUR) 30 MG 24 hr tablet Take 1 tablet (30 mg total) by mouth 2 (two) times daily. 01/04/15  Yes Luke K Kilroy, PA-C  lisinopril (PRINIVIL,ZESTRIL) 2.5 MG tablet Take 1 tablet (2.5 mg total) by mouth daily. 07/15/14  Yes Luke K Kilroy, PA-C  metoprolol (LOPRESSOR) 50 MG tablet Take 1 tablet (50 mg total) by mouth 2 (two) times daily. Patient taking differently: Take 12.5 mg by mouth 2 (two) times daily.  10/07/14  Yes Corky Crafts, MD  nitroGLYCERIN (NITROSTAT) 0.4 MG SL tablet Place 1 tablet (0.4 mg total) under the tongue every 5 (five) minutes as needed for chest pain. 01/04/15  Yes Luke K Kilroy, PA-C  pravastatin (PRAVACHOL) 80 MG tablet Take 1 tablet (80 mg total) by mouth every evening. Patient taking differently: Take 40 mg by mouth every evening.  10/19/14  Yes Corky Crafts, MD   Allergies  Allergen Reactions  . Atorvastatin Other (See Comments)    Gas, chest tightness  . Crestor [Rosuvastatin Calcium] Other (See Comments)    Muscle aches    FAMILY HISTORY:  indicated that her mother is deceased. She indicated that her father is deceased.  SOCIAL HISTORY:  reports that she has never smoked. She has never used  smokeless tobacco. She reports that she does not drink alcohol or use illicit drugs.  REVIEW OF SYSTEMS:  Unable, pt sedated on vent.  Does nod yes when asked about SOB   SUBJECTIVE:   VITAL SIGNS: Temp:  [97.7 F (36.5 C)-99.7 F (37.6 C)] 98.2 F (36.8 C) (10/05 0830) Pulse Rate:   [93-121] 99 (10/05 0915) Resp:  [0-32] 25 (10/05 0915) BP: (80-165)/(37-82) 107/50 mmHg (10/05 0900) SpO2:  [93 %-100 %] 100 % (10/05 0915) Arterial Line BP: (89-192)/(40-83) 111/47 mmHg (10/05 0915) FiO2 (%):  [40 %] 40 % (10/05 0810) Weight:  [114 lb 6.7 oz (51.9 kg)] 114 lb 6.7 oz (51.9 kg) (10/05 0500) HEMODYNAMICS: PAP: (21-69)/(11-42) 27/15 mmHg CO:  [4.1 L/min-5.7 L/min] 4.8 L/min CI:  [3 L/min/m2-4.1 L/min/m2] 3.5 L/min/m2 VENTILATOR SETTINGS: Vent Mode:  [-] PSV;CPAP FiO2 (%):  [40 %] 40 % Set Rate:  [14 bmp] 14 bmp Vt Set:  [500 mL] 500 mL PEEP:  [5 cmH20] 5 cmH20 Pressure Support:  [10 cmH20] 10 cmH20 Plateau Pressure:  [9 cmH20-30 cmH20] 9 cmH20 INTAKE / OUTPUT:  Intake/Output Summary (Last 24 hours) at 02/10/15 0930 Last data filed at 02/10/15 0900  Gross per 24 hour  Intake 3831.59 ml  Output   4780 ml  Net -948.41 ml    PHYSICAL EXAMINATION: General:  Acutely ill appearing female, mild distress, coughing/gagging  Neuro:  Sedated, RASS 0/-1, follows some commands, VERY weak HEENT:  Mm moist, ETT Cardiovascular:  s1s2 rrr, sternotomy dressing c/d  Lungs:  resps even, mildly labored on PS 10/5, few scattered crackles, mild exp wheeze  Abdomen:  Round, soft, non tender, +bs  Musculoskeletal:  Warm and dry, generalized edema   LABS:  CBC  Recent Labs Lab 02/08/15 1759 02/09/15 0409 02/09/15 1651 02/10/15 0416  WBC 9.6 9.5  --  12.6*  HGB 9.1* 8.4* 8.5* 8.8*  HCT 27.0* 24.7* 25.0* 25.8*  PLT 136* 150  --  229   Coag's  Recent Labs Lab 02/04/15 0410  02/05/15 0400 02/05/15 1110 02/05/15 1606 02/05/15 2200  APTT 103*  < > 71* 68* 72* 48*  INR 1.47  --  1.60*  --   --  1.51*  < > = values in this interval not displayed. BMET  Recent Labs Lab 02/08/15 1759 02/09/15 0409 02/09/15 1651 02/10/15 0416  NA 126* 125* 128* 127*  K 3.0* 3.6 3.5 3.3*  CL 90* 89* 88* 91*  CO2 22 27  --  28  BUN 24* 29* 38* 36*  CREATININE 1.04* 0.92 1.10*  1.03*  GLUCOSE 193* 149* 165* 168*   Electrolytes  Recent Labs Lab 02/06/15 1638 02/07/15 0309 02/08/15 0354 02/08/15 1759 02/09/15 0409 02/10/15 0416  CALCIUM  --  7.8* 7.9* 7.9* 7.8* 8.1*  MG 2.2 1.9 1.6*  --   --   --   PHOS  --  4.7* 4.0  --   --   --    Sepsis Markers No results for input(s): LATICACIDVEN, PROCALCITON, O2SATVEN in the last 168 hours. ABG  Recent Labs Lab 02/08/15 0500 02/08/15 1804 02/09/15 0410  PHART 7.468* 7.422 7.473*  PCO2ART 38.7 42.9 39.7  PO2ART 161* 110.0* 119*   Liver Enzymes  Recent Labs Lab 02/08/15 0354 02/09/15 0409 02/10/15 0416  AST 44* 36 38  ALT 33 30 31  ALKPHOS 94 121 126  BILITOT 2.6* 3.7* 3.9*  ALBUMIN 2.3* 2.1* 2.1*   Cardiac Enzymes No results for input(s): TROPONINI, PROBNP in the last 168 hours.  Glucose  Recent Labs Lab 02/09/15 1158 02/09/15 1632 02/09/15 1935 02/09/15 2312 02/10/15 0352 02/10/15 0729  GLUCAP 191* 155* 126* 176* 155* 181*    Imaging Dg Chest Port 1 View  02/10/2015   CLINICAL DATA:  Shortness of breath.  EXAM: PORTABLE CHEST 1 VIEW  COMPARISON:  02/09/2015.  FINDINGS: Endotracheal tube, NG tube, Swan-Ganz catheter, left PICC line, bilateral chest tubes in stable position. Interim removal of mediastinal drainage catheters. Prior CABG. Heart size stable. No pulmonary venous congestion. Low lung volumes with mild basilar atelectasis. No pleural effusion or pneumothorax.  IMPRESSION: 1. Interim removal of mediastinal drainage catheters. Remaining lines and tubes including bilateral chest tubes in stable position. No pneumothorax. 2. Prior CABG.  Heart size stable.  No pulmonary venous congestion. 3. Low lung volumes.   Electronically Signed   By: Maisie Fus  Register   On: 02/10/2015 08:10     ASSESSMENT / PLAN:  PULMONARY OETT 9/26>>> Acute respiratory failure - multifactorial in setting heart failure, cardiogenic shock and severe deconditioning post multiple surgeries  P:   Cont daily  attempts at PS wean as tol  Not yet ready for extubation  Diuresis per cards  Nutrition as below  Continue precedex  F/u CXR  Add PRN BD   CARDIOVASCULAR CVL R IJ cortis 9/26>>> LUA PICC >>>  CAD s/p CABG  Acute systolic heart failure  Ischemic cardiomyopathy  Cardiogenic shock  P:  Continue lasix gtt, norepi  Add milrinone per cards  Heart failure, CVTS following  F/u co-ox   RENAL Hyponatremia  AKI - mild  Hypokalemia  P:   Continue lasix gtt per cards  F/u chem  Replete K PRN  GASTROINTESTINAL Protein calorie malnutrition  P:   Place panda today  TF per nutrition  PPI  HEMATOLOGIC Thrombocytopenia - improved  Anemia - mild P:  F/u CBC lovenox   INFECTIOUS Open sternum  P:   Wound (Sternum) 10/3>>> Sputum 10/4>>>   Vancomycin 10/5>>>  ENDOCRINE Hyperglycemia  P:   SSI  NEUROLOGIC Sedation requirement  P:   RASS goal: 0 Continue precedex for now  PRN pain rx    FAMILY  - Updates:   D/w Dr Clarise Cruz 10/5, no family available 10/5    Dirk Dress, NP 02/10/2015  9:30 AM Pager: [phone] or [phone]

## 2015-02-10 NOTE — Progress Notes (Signed)
Anesthesiology Follow-up:  Sedated on vent, opens eyes to commands, on milrinone 0.3 mcg/kg/min and levophed 7 mcg/min.    VS: T- 36.6 BP- 103/48 HR 95 (SR) PA 26/14 CVP- 17 CO/CI 4.1/3.0  FiO2 0.40 TV- 500 PRVC 14 bpm PEEP 5 PAP 33  CoOx 67% BUN/Cr 36/1.03 glucose 168 K- 3.3  H/H- 8.8/25.8 platelets- 229,000  She appears to be tolerating sternotomy closure, still requiring milrinone, levophed for hemodynamic support, plan to continue diuresis, wean pressors as tolerated.  Kipp Brood

## 2015-02-10 NOTE — Progress Notes (Signed)
2 Days Post-Op Procedure(s) (LRB): STERNAL CLOSURE (N/A) TRANSESOPHAGEAL ECHOCARDIOGRAM (TEE) (N/A) Subjective: Hemodynamics stable but not able to wean and extubate- CCM called Neuro status better today + BM- will place panda and transition from TNA DC swan today  Objective: Vital signs in last 24 hours: Temp:  [97.7 F (36.5 C)-100 F (37.8 C)] 97.9 F (36.6 C) (10/05 0800) Pulse Rate:  [93-121] 96 (10/05 0800) Cardiac Rhythm:  [-] Sinus tachycardia (10/05 0800) Resp:  [0-32] 15 (10/05 0800) BP: (80-165)/(37-82) 97/44 mmHg (10/05 0800) SpO2:  [93 %-100 %] 100 % (10/05 0800) Arterial Line BP: (89-192)/(40-83) 115/48 mmHg (10/05 0800) FiO2 (%):  [40 %] 40 % (10/05 0810) Weight:  [114 lb 6.7 oz (51.9 kg)] 114 lb 6.7 oz (51.9 kg) (10/05 0500)  Hemodynamic parameters for last 24 hours: PAP: (21-69)/(11-42) 25/13 mmHg CO:  [4.1 L/min-5.7 L/min] 4.1 L/min CI:  [3 L/min/m2-4.1 L/min/m2] 3 L/min/m2  Intake/Output from previous day: 10/04 0701 - 10/05 0700 In: 3923.3 [I.V.:1388.3; NG/GT:30; IV Piggyback:700; TPN:1805] Out: 4290 [Urine:4030; Emesis/NG output:40; Chest Tube:220] Intake/Output this shift: Total I/O In: 288.9 [I.V.:63.9; IV Piggyback:150; TPN:75] Out: 375 [Urine:375]  Lungs clear extrem warm  Lab Results:  Recent Labs  02/09/15 0409 02/09/15 1651 02/10/15 0416  WBC 9.5  --  12.6*  HGB 8.4* 8.5* 8.8*  HCT 24.7* 25.0* 25.8*  PLT 150  --  229   BMET:  Recent Labs  02/09/15 0409 02/09/15 1651 02/10/15 0416  NA 125* 128* 127*  K 3.6 3.5 3.3*  CL 89* 88* 91*  CO2 27  --  28  GLUCOSE 149* 165* 168*  BUN 29* 38* 36*  CREATININE 0.92 1.10* 1.03*  CALCIUM 7.8*  --  8.1*    PT/INR: No results for input(s): LABPROT, INR in the last 72 hours. ABG    Component Value Date/Time   PHART 7.473* 02/09/2015 0410   HCO3 28.6* 02/09/2015 0410   TCO2 27 02/09/2015 1651   ACIDBASEDEF 6.0* 02/07/2015 0245   O2SAT 66.9 02/10/2015 0400   CBG (last 3)   Recent  Labs  02/09/15 2312 02/10/15 0352 02/10/15 0729  GLUCAP 176* 155* 181*    Assessment/Plan: S/P Procedure(s) (LRB): STERNAL CLOSURE (N/A) TRANSESOPHAGEAL ECHOCARDIOGRAM (TEE) (N/A) Diuresis d/c tubes/lines CCM for vent wean   LOS: 15 days    Breanna Alvarez 02/10/2015

## 2015-02-11 ENCOUNTER — Inpatient Hospital Stay (HOSPITAL_COMMUNITY): Payer: Medicaid Other

## 2015-02-11 LAB — POCT I-STAT, CHEM 8
BUN: 42 mg/dL — ABNORMAL HIGH (ref 6–20)
CREATININE: 1.1 mg/dL — AB (ref 0.44–1.00)
Calcium, Ion: 1.1 mmol/L — ABNORMAL LOW (ref 1.13–1.30)
Chloride: 92 mmol/L — ABNORMAL LOW (ref 101–111)
GLUCOSE: 192 mg/dL — AB (ref 65–99)
HEMATOCRIT: 27 % — AB (ref 36.0–46.0)
HEMOGLOBIN: 9.2 g/dL — AB (ref 12.0–15.0)
Potassium: 3.6 mmol/L (ref 3.5–5.1)
Sodium: 132 mmol/L — ABNORMAL LOW (ref 135–145)
TCO2: 28 mmol/L (ref 0–100)

## 2015-02-11 LAB — GLUCOSE, CAPILLARY
GLUCOSE-CAPILLARY: 131 mg/dL — AB (ref 65–99)
GLUCOSE-CAPILLARY: 112 mg/dL — AB (ref 65–99)
GLUCOSE-CAPILLARY: 118 mg/dL — AB (ref 65–99)
Glucose-Capillary: 124 mg/dL — ABNORMAL HIGH (ref 65–99)
Glucose-Capillary: 140 mg/dL — ABNORMAL HIGH (ref 65–99)
Glucose-Capillary: 163 mg/dL — ABNORMAL HIGH (ref 65–99)

## 2015-02-11 LAB — CBC
HCT: 25.1 % — ABNORMAL LOW (ref 36.0–46.0)
Hemoglobin: 8.6 g/dL — ABNORMAL LOW (ref 12.0–15.0)
MCH: 30.2 pg (ref 26.0–34.0)
MCHC: 34.3 g/dL (ref 30.0–36.0)
MCV: 88.1 fL (ref 78.0–100.0)
Platelets: 300 10*3/uL (ref 150–400)
RBC: 2.85 MIL/uL — ABNORMAL LOW (ref 3.87–5.11)
RDW: 15.7 % — ABNORMAL HIGH (ref 11.5–15.5)
WBC: 11 10*3/uL — ABNORMAL HIGH (ref 4.0–10.5)

## 2015-02-11 LAB — CARBOXYHEMOGLOBIN
Carboxyhemoglobin: 1.5 % (ref 0.5–1.5)
Methemoglobin: 1 % (ref 0.0–1.5)
O2 Saturation: 82.1 %
Total hemoglobin: 13.2 g/dL (ref 12.0–16.0)

## 2015-02-11 LAB — MAGNESIUM: Magnesium: 1.8 mg/dL (ref 1.7–2.4)

## 2015-02-11 LAB — COMPREHENSIVE METABOLIC PANEL
ALT: 40 U/L (ref 14–54)
AST: 48 U/L — ABNORMAL HIGH (ref 15–41)
Albumin: 2 g/dL — ABNORMAL LOW (ref 3.5–5.0)
Alkaline Phosphatase: 192 U/L — ABNORMAL HIGH (ref 38–126)
Anion gap: 11 (ref 5–15)
BUN: 41 mg/dL — ABNORMAL HIGH (ref 6–20)
CO2: 30 mmol/L (ref 22–32)
Calcium: 8.2 mg/dL — ABNORMAL LOW (ref 8.9–10.3)
Chloride: 89 mmol/L — ABNORMAL LOW (ref 101–111)
Creatinine, Ser: 0.96 mg/dL (ref 0.44–1.00)
GFR calc Af Amer: >60 mL/min (ref >60)
GFR calc non Af Amer: >60 mL/min (ref >60)
Glucose, Bld: 159 mg/dL — ABNORMAL HIGH (ref 65–99)
Potassium: 3.5 mmol/L (ref 3.5–5.1)
Sodium: 130 mmol/L — ABNORMAL LOW (ref 135–145)
Total Bilirubin: 5 mg/dL — ABNORMAL HIGH (ref 0.3–1.2)
Total Protein: 5.4 g/dL — ABNORMAL LOW (ref 6.5–8.1)

## 2015-02-11 LAB — WOUND CULTURE: CULTURE: NO GROWTH

## 2015-02-11 LAB — PHOSPHORUS: Phosphorus: 3.6 mg/dL (ref 2.5–4.6)

## 2015-02-11 LAB — ANAEROBIC CULTURE: Gram Stain: NONE SEEN

## 2015-02-11 MED ORDER — POTASSIUM CHLORIDE 10 MEQ/50ML IV SOLN
10.0000 meq | INTRAVENOUS | Status: AC
  Administered 2015-02-11 (×3): 10 meq via INTRAVENOUS
  Filled 2015-02-11 (×3): qty 50

## 2015-02-11 MED ORDER — CEFTAZIDIME 1 G IJ SOLR
1.0000 g | Freq: Two times a day (BID) | INTRAMUSCULAR | Status: DC
  Administered 2015-02-11 – 2015-02-16 (×12): 1 g via INTRAVENOUS
  Filled 2015-02-11 (×15): qty 1

## 2015-02-11 MED ORDER — VITAL HIGH PROTEIN PO LIQD: 1000.0000 mL | ORAL | Status: DC

## 2015-02-11 MED ORDER — HYDROMORPHONE HCL 1 MG/ML IJ SOLN
0.5000 mg | INTRAMUSCULAR | Status: DC | PRN
  Administered 2015-02-11 – 2015-02-12 (×5): 0.5 mg via INTRAVENOUS
  Administered 2015-02-12: 0.25 mg via INTRAVENOUS
  Administered 2015-02-13 – 2015-02-18 (×17): 0.5 mg via INTRAVENOUS
  Filled 2015-02-11 (×22): qty 1

## 2015-02-11 MED ORDER — HALOPERIDOL LACTATE 5 MG/ML IJ SOLN
2.0000 mg | Freq: Four times a day (QID) | INTRAMUSCULAR | Status: DC | PRN
  Administered 2015-02-11 – 2015-02-12 (×3): 2 mg via INTRAVENOUS
  Filled 2015-02-11 (×3): qty 1

## 2015-02-11 MED ORDER — FUROSEMIDE 10 MG/ML IJ SOLN
20.0000 mg | Freq: Two times a day (BID) | INTRAMUSCULAR | Status: DC
  Administered 2015-02-11 – 2015-02-12 (×4): 20 mg via INTRAVENOUS
  Filled 2015-02-11 (×6): qty 2

## 2015-02-11 MED ORDER — QUETIAPINE FUMARATE 50 MG PO TABS
50.0000 mg | ORAL_TABLET | Freq: Every day | ORAL | Status: DC
  Administered 2015-02-11 – 2015-02-19 (×9): 50 mg via ORAL
  Filled 2015-02-11 (×10): qty 1

## 2015-02-11 MED ORDER — VITAL AF 1.2 CAL PO LIQD
1000.0000 mL | ORAL | Status: DC
  Administered 2015-02-11 – 2015-02-16 (×6): 1000 mL
  Filled 2015-02-11 (×10): qty 1000

## 2015-02-11 NOTE — Progress Notes (Addendum)
[facility] DAILY ICU PROGRESS NOTE                   [address]            [place] [address]          [phone]   3 Days Post-Op Procedure(s) (LRB): STERNAL CLOSURE (N/A) TRANSESOPHAGEAL ECHOCARDIOGRAM (TEE) (N/A)  Total Length of Stay:  LOS: 16 days   Subjective:  Remains intubated, opens eyes, follows commands  Objective: Vital signs in last 24 hours: Temp:  [97.7 F (36.5 C)-100 F (37.8 C)] 99.1 F (37.3 C) (10/06 0400) Pulse Rate:  [95-117] 110 (10/06 0715) Cardiac Rhythm:  [-] Sinus tachycardia (10/06 0400) Resp:  [14-30] 18 (10/06 0715) BP: (82-113)/(34-79) 98/40 mmHg (10/06 0700) SpO2:  [99 %-100 %] 100 % (10/06 0715) Arterial Line BP: (70-135)/(37-61) 108/44 mmHg (10/06 0715) FiO2 (%):  [40 %] 40 % (10/06 0400) Weight:  [112 lb 10.5 oz (51.1 kg)] 112 lb 10.5 oz (51.1 kg) (10/06 0530)  Filed Weights   02/09/15 0500 02/10/15 0500 02/11/15 0530  Weight: 118 lb 9.7 oz (53.8 kg) 114 lb 6.7 oz (51.9 kg) 112 lb 10.5 oz (51.1 kg)    Weight change: -1 lb 12.2 oz (-0.8 kg)   Hemodynamic parameters for last 24 hours: PAP: (25-27)/(12-15) 27/15 mmHg CO:  [4.8 L/min] 4.8 L/min CI:  [3.5 L/min/m2] 3.5 L/min/m2  Intake/Output from previous day: 10/05 0701 - 10/06 0700 In: 4108.1 [I.V.:1572.1; NG/GT:306; IV Piggyback:430; TPN:1800] Out: 6325 [Urine:5765; Emesis/NG output:550; Chest Tube:10]  Intake/Output this shift: Total I/O In: 50 [IV Piggyback:50] Out: -   Current Meds: Scheduled Meds: . sodium chloride   Intravenous Once  . acetaminophen (TYLENOL) oral liquid 160 mg/5 mL  650 mg Oral Q6H  . antiseptic oral rinse  7 mL Mouth Rinse q12n4p  . aspirin EC  325 mg Oral Daily   Or  . aspirin  324 mg Per Tube Daily  . chlorhexidine  15 mL Mouth Rinse BID  . enoxaparin (LOVENOX) injection  40 mg Subcutaneous Q24H  . insulin aspart  0-24 Units Subcutaneous 6 times per day  . metoCLOPramide (REGLAN) injection  10 mg Intravenous 4 times per day  .  mupirocin ointment  1 application Topical BID  . pantoprazole sodium  40 mg Per Tube Daily  . potassium chloride  10 mEq Intravenous Q1 Hr x 3  . sodium chloride  10-40 mL Intracatheter Q12H  . sodium chloride  3 mL Intravenous Q12H  . vancomycin  500 mg Intravenous Q24H   Continuous Infusions: . Marland KitchenTPN (CLINIMIX-E) Adult 75 mL/hr at 02/10/15 1728  . sodium chloride 20 mL/hr at 02/11/15 0400  . sodium chloride    . dexmedetomidine 0.8 mcg/kg/hr (02/11/15 0600)  . feeding supplement (VITAL 1.5 CAL) 1,000 mL (02/11/15 0630)  . furosemide (LASIX) infusion 4 mg/hr (02/11/15 0400)  . milrinone 0.3 mcg/kg/min (02/11/15 0400)  . norepinephrine (LEVOPHED) Adult infusion 8 mcg/min (02/11/15 0400)   PRN Meds:.sodium chloride, albuterol, HYDROmorphone (DILAUDID) injection, iohexol, metoprolol, ondansetron (ZOFRAN) IV, sodium chloride, sodium chloride  General appearance: alert, cooperative and no distress Heart: regular rate and rhythm Lungs: clear to auscultation bilaterally Abdomen: soft, non-tender; bowel sounds normal; no masses,  no organomegaly Extremities: edema trace Wound: clean and dry  Lab Results: CBC: Recent Labs  02/10/15 0416 02/10/15 1642 02/11/15 0520  WBC 12.6*  --  11.0*  HGB 8.8* 8.8* 8.6*  HCT 25.8* 26.0* 25.1*  PLT 229  --  300   BMET:  Recent Labs  02/10/15 0416 02/10/15 1642 02/11/15 0520  NA 127* 130* 130*  K 3.3* 3.7 3.5  CL 91* 90* 89*  CO2 28  --  30  GLUCOSE 168* 175* 159*  BUN 36* 44* 41*  CREATININE 1.03* 1.00 0.96  CALCIUM 8.1*  --  8.2*    PT/INR: No results for input(s): LABPROT, INR in the last 72 hours. Radiology: Dg Abd 1 View  02/10/2015   CLINICAL DATA:  Encounter for feeding tube placement  EXAM: ABDOMEN - 1 VIEW  COMPARISON:  Portable exam 1449 hours compared to 02/10/2015 abdominal radiograph.  FINDINGS: Small amount of contrast has been injected into the feeding tube.  Tip of tube is beyond ligament of Treitz.  Contrast opacifies  proximal jejunal loops.  No bowel wall thickening identified.  IMPRESSION: Tip of feeding tube is beyond the ligament of Treitz in the proximal jejunum.   Electronically Signed   By: Ulyses Southward M.D.   On: 02/10/2015 15:25   Dg Chest Portable 1 View  02/11/2015   CLINICAL DATA:  CABG.  Respiratory failure.  EXAM: PORTABLE CHEST 1 VIEW  COMPARISON:  02/10/2015.  FINDINGS: Endotracheal tube, NG tube, feeding tube, right IJ sheath, left subclavian central line in stable position. Interim removal of Swan-Ganz catheter. Interim removal of bilateral chest tubes. Small pleural density noted at the site of the left chest tube. Cardiomegaly with mild pulmonary interstitial prominence suggesting mild congestive heart failure.  IMPRESSION: 1. Interim removal of bilateral chest tubes and Swan-Ganz catheter. Remaining lines and tubes in stable position. No pneumothorax.  2. Prior CABG. Cardiomegaly with new onset of mild pulmonary interstitial prominence suggesting a mild component of congestive heart failure.   Electronically Signed   By: Maisie Fus  Register   On: 02/11/2015 07:33   Dg Abd Portable 1v  02/10/2015   CLINICAL DATA:  Feeding tube placement.  EXAM: PORTABLE ABDOMEN - 1 VIEW  COMPARISON:  None.  FINDINGS: Feeding tube has been advanced. Its tip is in the region of the distal stomach or proximal duodenum. NG tube noted in stable position with tip in the stomach. No bowel distention. Central line and sheath noted over the right heart. Prior CABG.  IMPRESSION: Interim advancement of feeding tube, its tip is now over the distal stomach/proximal duodenum. NG tube in stable position with tip in the stomach. No bowel distention.   Electronically Signed   By: Maisie Fus  Register   On: 02/10/2015 13:46   Dg Abd Portable 1v  02/10/2015   CLINICAL DATA:  Encounter for feeding tube placement.  EXAM: PORTABLE ABDOMEN - 1 VIEW  COMPARISON:  Chest radiograph 02/10/2015  FINDINGS: There are epicardial pacer wires. Nasogastric  tube in the region the stomach body. There is a feeding tube which extends into the region of the gastric antrum. Nonobstructive bowel gas pattern. Limited evaluation of the lung bases.  IMPRESSION: Feeding tube tip in the region of the distal stomach.  Nasogastric tube tip in the gastric body region.   Electronically Signed   By: Richarda Overlie M.D.   On: 02/10/2015 10:17   Dg Vangie Bicker G Tube Plc W/fl-no Rad  02/10/2015   CLINICAL DATA:    NASO G TUBE PLACEMENT WITH FLUORO  Fluoroscopy was utilized by the requesting physician.  No radiographic  interpretation.      Assessment/Plan: S/P Procedure(s) (LRB): STERNAL CLOSURE (N/A) TRANSESOPHAGEAL ECHOCARDIOGRAM (TEE) (N/A)  1. CV- hemodynamically stable on Milrinone continue at current rate  today, will wean Neo as tolerated 2. Pulm- remains intubated, continue to wean to extubate as tolerated 3. Renal- creatinine WNL, hypervolemic remains on Lasix gtt 4. Expected post operative blood loss anemia- Hgb relatively stable at 8.6 5. CBGs controlled, continue current insulin regimen 6. Dispo- patient stable, keep Milrinone at current dose per HF, wean Neo as tolerated, wean to extubate as tolerated    [name], [name] 02/11/2015 7:43 AM  Wean vent per CCM Empiric antibiotics for poss HCAP Wean off TNA patient examined and medical record reviewed,agree with above note. Kathlee Nations Trigt III 02/11/2015

## 2015-02-11 NOTE — Progress Notes (Signed)
PARENTERAL NUTRITION CONSULT NOTE - Follow Up  Pharmacy Consult for TPN Indication: Prolonged ileus  Allergies  Allergen Reactions  . Atorvastatin Other (See Comments)    Gas, chest tightness  . Crestor [Rosuvastatin Calcium] Other (See Comments)    Muscle aches   Patient Measurements: Height: 5' (152.4 cm) Weight: 112 lb 10.5 oz (51.1 kg) IBW/kg (Calculated) : 45.5  Usual Weight: ~100-106 pounds (46-48 kg)  Vital Signs: Temp: 99.9 F (37.7 C) (10/06 0800) Temp Source: Axillary (10/06 0800) BP: 91/38 mmHg (10/06 0800) Pulse Rate: 107 (10/06 0800) Intake/Output from previous day: 10/05 0701 - 10/06 0700 In: 4108.1 [I.V.:1572.1; NG/GT:306; IV Piggyback:430; TPN:1800] Out: 6325 [Urine:5765; Emesis/NG output:550; Chest Tube:10] Intake/Output from this shift: Total I/O In: 275.9 [I.V.:65.9; NG/GT:35; IV Piggyback:100; TPN:75] Out: 200 [Urine:200] Labs:  Recent Labs  02/09/15 0409  02/10/15 0416 02/10/15 1642 02/11/15 0520  WBC 9.5  --  12.6*  --  11.0*  HGB 8.4*  < > 8.8* 8.8* 8.6*  HCT 24.7*  < > 25.8* 26.0* 25.1*  PLT 150  --  229  --  300  < > = values in this interval not displayed.  Recent Labs  02/09/15 0409  02/10/15 0416 02/10/15 1642 02/11/15 0520  NA 125*  < > 127* 130* 130*  K 3.6  < > 3.3* 3.7 3.5  CL 89*  < > 91* 90* 89*  CO2 27  --  28  --  30  GLUCOSE 149*  < > 168* 175* 159*  BUN 29*  < > 36* 44* 41*  CREATININE 0.92  < > 1.03* 1.00 0.96  CALCIUM 7.8*  --  8.1*  --  8.2*  MG  --   --   --   --  1.8  PHOS  --   --   --   --  3.6  PROT 5.1*  --  5.7*  --  5.4*  ALBUMIN 2.1*  --  2.1*  --  2.0*  AST 36  --  38  --  48*  ALT 30  --  31  --  40  ALKPHOS 121  --  126  --  192*  BILITOT 3.7*  --  3.9*  --  5.0*  < > = values in this interval not displayed. Estimated Creatinine Clearance: 43.6 mL/min (by C-G formula based on Cr of 0.96).   Recent Labs  02/10/15 1939 02/10/15 2350 02/11/15 0348  GLUCAP 111* 124* 140*   Medical  History: Past Medical History  Diagnosis Date  . Hyperlipidemia   . Acute systolic CHF (congestive heart failure), NYHA class 3 (HCC) 07/10/2014  . Hypertension    Medications:  Infusions:  . sodium chloride 20 mL/hr at 02/11/15 0800  . sodium chloride    . dexmedetomidine 0.1 mcg/kg/hr (02/11/15 0810)  . feeding supplement (VITAL 1.5 CAL) 1,000 mL (02/11/15 0800)  . milrinone 0.3 mcg/kg/min (02/11/15 0800)  . norepinephrine (LEVOPHED) Adult infusion 6 mcg/min (02/11/15 0815)   Admit:  64 yo female with cardiac problems and recent CABG on 9/26 and Centrimag removal on 9/30 with an open sternum and plans for return to OR on Monday.  She was in her usual state of health prior to admit with a weight range of 100-105 pounds.  She has some fluid gain and her weight on admission is ~ 133 pounds.  Surgeries/Procedures: S/p CABG x4, TEE, Removal of CVAD  Insulin Requirements in the past 24 hours:  24 units of SSI 40 units  of regular insulin in TPN  Current Nutrition:  Currently NPO Vital 1.5 Cal @ 42ml/hr (Provides 49g of protein and ~1080 kcal) Clinimix E 5/15 @ 69ml/hr (Provides 90g of protein and 1278 kcal meeting 100% of protein and kcal needs)  Nutritional Goals: per RD recs on 10/1 1270 kCal > 90 gm of protein  Goal Vital 1.5 Cal rate is 71ml/hr (Provides 65g of protein and ~1400 kcal)  GI: Post-op ileus, no bowel sounds, no stools, has little Emesis/NG output, chest tube output decreased to yesterday. PPI via NG tube. Albumin low at 2.0. Prealbumin low at 6.6. Last BM was 10/4.  Endo: CBGs slightly elevated (110s-170s)  Lytes: Na low at 130, K low at 3.5 (goal > 4) replaced. CoCa = 9.5. Phos ok and Mg low at 1.6 (goal > 2) replaced. Ca x Phos < 55.  Renal: SCr 0.96, CrCl ~27ml/min. Excellent diuresis (3.49ml/kg/hr) for past 2 days On lasix gtt at /hr. Has 1/2NS running at 53ml/hr  Pulm: Remains intubated and sedated with precedex  Cards: S/p CABG x 4 - placement  of CAVD (and removed). S/p sternum closure yesterday. BP soft (on pressors) and HR tachy. Levo, Milrinone, and lasix infusing  Hepatobil: LFTs wnl. TBili slightly elevated and trending up to 5.0 (no jaundice noted)  HEME: Post-op anemia and thrombocytopenia - stable. Hgb 8.8, plts wnl  Neuro: Sedated - Now off propofol and on precedex. Trig stable at 131. On morphine gtt  ID: No current abx. Currently afebrile. WBC slightly elevated at 11.0. Wound and blood cultures NGTD  Best Practices: SCD's  TPN Access: PICC - placed 9/28  TPN start date: 10/1 >>  Plan:  Decrease Clinimix E5/15 to 89ml/hr x 2 hours. Then stop TPN Vital 1.5 Cal currently @ 80ml/hr (Provides 49g of protein and ~1080 kcal) Plan is to titrate up to goal rate of 7ml/hr  D/C TPN labs and orders  Enzo Bi, PharmD Clinical Pharmacist Pager [phone] 02/11/2015 8:45 AM

## 2015-02-11 NOTE — Progress Notes (Signed)
RT note-Attempted to preserve arterial line, blood backed up into line through to the bag, another bag and line s/u and changed, no wave form and no blood draw. Line pulled, RN notified.

## 2015-02-11 NOTE — Progress Notes (Signed)
Nutrition Follow Up  DOCUMENTATION CODES:   Not applicable  INTERVENTION:    D/C Vital 1.5 formula   Initiate Vital AF 1.2 formula at 15 ml/hr and increase by 10 ml every 4 hours to goal rate of 45 ml/hr   TF regimen to provide 1296 kcals, 81 gm protein, 876 ml of free water  NUTRITION DIAGNOSIS:   Inadequate oral intake related to inability to eat as evidenced by NPO status, ongoing  GOAL:   Patient will meet greater than or equal to 90% of their needs, met  MONITOR:   TF tolerance, Vent status, Weight trends, Labs, I & O's  ASSESSMENT:   64 y.o. Femalewho had a NSTEMI in early 2016; diagnosed with multivessel CAD; presented for surgical intervention.  Patient s/p procedure 9/26: CORONARY ARTERY BYPASS GRAFTING x 4 PLACEMENT OF CENTRIMAG VENTRICULAR ASSIST DEVICE  Patient is currently intubated on ventilator support -- OGT in place MV: 7.2 L/min Temp (24hrs), Avg:99.6 F (37.6 C), Min:99 F (37.2 C), Max:100 F (37.8 C)   TPN discontinued today.  Vital 1.5 formula initiated 10/5 via NGT (tip beyond the ligament of Treitz in jejunum).  Currently infusing at 30 ml/hr providing 1080 kcals, 48 gm protein, 550 ml of free water.  Telephone with readback orders received per Dr. Prescott Gum to change TF regimen.  Diet Order:  Diet NPO time specified  Skin:  multiple surgical wounds/incisions  Last BM:  10/4  Height:  Ht Readings from Last 1 Encounters:  01/26/15 5' (1.524 m)    Weight:  Wt Readings from Last 1 Encounters:  02/11/15 112 lb 10.5 oz (51.1 kg)    Wt Readings from Last 10 Encounters:  02/11/15 112 lb 10.5 oz (51.1 kg)  01/22/15 102 lb 3.2 oz (46.358 kg)  10/01/14 104 lb 12.8 oz (47.537 kg)  08/10/14 106 lb (48.081 kg)  07/15/14 107 lb 4.8 oz (48.671 kg)  07/10/14 112 lb (50.803 kg)    Ideal Body Weight:  45.4 kg  BMI:  Body mass index is 22 kg/(m^2).  Estimated Nutritional Needs:   Kcal:  1277   Protein:  75-85 gm   Fluid:  per  MD  EDUCATION NEEDS:   No education needs identified at this time  Arthur Holms, RD, LDN Pager #: [phone] After-Hours Pager #: [phone]

## 2015-02-11 NOTE — Progress Notes (Signed)
Advanced Heart Failure Rounding Note   Subjective:    S/p CABG with Centrimag placement 02/01/15  Centrimag explanted 9/30.   Sternal Closure 10/3.    Remains intubated. Awake follows commands. Tolerated CPAP for 1 hour yesterday.  Remains on milrinone 0.3, levophed 8. Diuresing well on lasix drip at 4 mg per hour.   Weight down to 128-> 118>114  > 112 Objective:   Weight Range:  Vital Signs:   Temp:  [97.7 F (36.5 C)-100 F (37.8 C)] 99.1 F (37.3 C) (10/06 0400) Pulse Rate:  [94-117] 112 (10/06 0530) Resp:  [14-30] 21 (10/06 0530) BP: (82-113)/(34-79) 106/39 mmHg (10/06 0500) SpO2:  [99 %-100 %] 100 % (10/06 0530) Arterial Line BP: (70-135)/(37-61) 129/54 mmHg (10/06 0530) FiO2 (%):  [40 %] 40 % (10/06 0400) Last BM Date: 02/09/15  Weight change: Filed Weights   02/08/15 0600 02/09/15 0500 02/10/15 0500  Weight: 53.5 kg (117 lb 15.1 oz) 53.8 kg (118 lb 9.7 oz) 51.9 kg (114 lb 6.7 oz)    Intake/Output:   Intake/Output Summary (Last 24 hours) at 02/11/15 0532 Last data filed at 02/11/15 0500  Gross per 24 hour  Intake 4139.36 ml  Output   6205 ml  Net -2065.64 ml     Physical Exam:  General:  Intubated awake. Follows commands  HEENT: normal x ETT Neck: supple. RIJ swan JVP Cor: Chest with dressing. Tachy regular Lungs: clear Abdomen: soft, nontender, nondistended. Hypoactive bowel sounds. Extremities: no cyanosis, clubbing, rash,  Tr edema Neuro: awake on vent    Telemetry: Sinus tach 110-120  Labs: Basic Metabolic Panel:  Recent Labs Lab 02/05/15 0400  02/06/15 0421 02/06/15 1638  02/07/15 0309 02/08/15 0354 02/08/15 1759 02/09/15 0409 02/09/15 1651 02/10/15 0416 02/10/15 1642  NA 135  < > 136  --   < > 128* 129* 126* 125* 128* 127* 130*  K 3.8  < > 4.6  --   < > 3.7 3.3* 3.0* 3.6 3.5 3.3* 3.7  CL 105  < > 104  --   < > 98* 92* 90* 89* 88* 91* 90*  CO2 22  --  19*  --   --  21* 28 22 27   --  28  --   GLUCOSE 114*  < > 136*  --   <  > 186* 144* 193* 149* 165* 168* 175*  BUN 22*  < > 22*  --   < > 22* 24* 24* 29* 38* 36* 44*  CREATININE 1.26*  < > 1.24* 1.13*  < > 1.08* 0.88 1.04* 0.92 1.10* 1.03* 1.00  CALCIUM 8.3*  --  8.5*  --   --  7.8* 7.9* 7.9* 7.8*  --  8.1*  --   MG 2.0  --  3.0* 2.2  --  1.9 1.6*  --   --   --   --   --   PHOS  --   --   --   --   --  4.7* 4.0  --   --   --   --   --   < > = values in this interval not displayed.  Liver Function Tests:  Recent Labs Lab 02/06/15 0421 02/07/15 0309 02/08/15 0354 02/09/15 0409 02/10/15 0416  AST 65* 43* 44* 36 38  ALT 27 31 33 30 31  ALKPHOS 62 80 94 121 126  BILITOT 2.6* 2.4* 2.6* 3.7* 3.9*  PROT 5.0* 4.8* 5.4* 5.1* 5.7*  ALBUMIN 3.1* 2.6* 2.3*  2.1* 2.1*   No results for input(s): LIPASE, AMYLASE in the last 168 hours. No results for input(s): AMMONIA in the last 168 hours.  CBC:  Recent Labs Lab 02/07/15 0309 02/08/15 0354 02/08/15 1759 02/09/15 0409 02/09/15 1651 02/10/15 0416 02/10/15 1642  WBC 13.9* 9.8 9.6 9.5  --  12.6*  --   NEUTROABS 10.0* 6.6  --   --   --   --   --   HGB 9.9* 9.2* 9.1* 8.4* 8.5* 8.8* 8.8*  HCT 28.4* 27.3* 27.0* 24.7* 25.0* 25.8* 26.0*  MCV 86.6 86.9 86.3 87.3  --  86.6  --   PLT 105* 123* 136* 150  --  229  --     Cardiac Enzymes: No results for input(s): CKTOTAL, CKMB, CKMBINDEX, TROPONINI in the last 168 hours.  BNP: BNP (last 3 results)  Recent Labs  07/10/14 1315  BNP 1059.1*    ProBNP (last 3 results) No results for input(s): PROBNP in the last 8760 hours.    Other results:  Imaging: Dg Abd 1 View  02/10/2015   CLINICAL DATA:  Encounter for feeding tube placement  EXAM: ABDOMEN - 1 VIEW  COMPARISON:  Portable exam 1449 hours compared to 02/10/2015 abdominal radiograph.  FINDINGS: Small amount of contrast has been injected into the feeding tube.  Tip of tube is beyond ligament of Treitz.  Contrast opacifies proximal jejunal loops.  No bowel wall thickening identified.  IMPRESSION: Tip of  feeding tube is beyond the ligament of Treitz in the proximal jejunum.   Electronically Signed   By: Ulyses Southward M.D.   On: 02/10/2015 15:25   Dg Chest Port 1 View  02/10/2015   CLINICAL DATA:  Shortness of breath.  EXAM: PORTABLE CHEST 1 VIEW  COMPARISON:  02/09/2015.  FINDINGS: Endotracheal tube, NG tube, Swan-Ganz catheter, left PICC line, bilateral chest tubes in stable position. Interim removal of mediastinal drainage catheters. Prior CABG. Heart size stable. No pulmonary venous congestion. Low lung volumes with mild basilar atelectasis. No pleural effusion or pneumothorax.  IMPRESSION: 1. Interim removal of mediastinal drainage catheters. Remaining lines and tubes including bilateral chest tubes in stable position. No pneumothorax. 2. Prior CABG.  Heart size stable.  No pulmonary venous congestion. 3. Low lung volumes.   Electronically Signed   By: Maisie Fus  Register   On: 02/10/2015 08:10   Dg Chest Port 1 View  02/09/2015   CLINICAL DATA:  Status post CABG and subsequent left ventricular assist device placement and removal  EXAM: PORTABLE CHEST 1 VIEW  COMPARISON:  Portable chest x-ray of February 08, 2015  FINDINGS: The lungs are adequately inflated. The interstitial markings bilaterally are less conspicuous today. On the right the chest tube is unchanged with its tip projecting over the posterior lateral aspect of the fourth rib. On the left the upper chest tube is stable with the tip projecting over the lateral aspect of the fourth and fifth rib interspace. The lower chest tube tip projects over the medial aspect of the ninth and tenth rib interspace. There is minimal left lower lobe atelectasis remaining as well as a trace of pleural fluid at the left lung base. There is no pneumothorax.  The mediastinal drain is unchanged lying to the left of midline. The cardiac silhouette remains mildly enlarged. The pulmonary vascularity is less engorged today. The Swan-Ganz catheter tip projects in the distal main  pulmonary artery. The endotracheal tube tip lies 4.3 cm above the carina. The esophagogastric tube tip projects below  the inferior margin of the image. There are 7 intact sternal wires.  IMPRESSION: Ongoing improvement in the appearance of the pulmonary interstitium with decreasing pulmonary interstitial edema. There is no large pleural effusion and no pneumothorax. The support tubes are in reasonable position.   Electronically Signed   By: David  [place] M.D.   On: 02/09/2015 07:37   Dg Abd Portable 1v  02/10/2015   CLINICAL DATA:  Feeding tube placement.  EXAM: PORTABLE ABDOMEN - 1 VIEW  COMPARISON:  None.  FINDINGS: Feeding tube has been advanced. Its tip is in the region of the distal stomach or proximal duodenum. NG tube noted in stable position with tip in the stomach. No bowel distention. Central line and sheath noted over the right heart. Prior CABG.  IMPRESSION: Interim advancement of feeding tube, its tip is now over the distal stomach/proximal duodenum. NG tube in stable position with tip in the stomach. No bowel distention.   Electronically Signed   By: Maisie Fus  Register   On: 02/10/2015 13:46   Dg Abd Portable 1v  02/10/2015   CLINICAL DATA:  Encounter for feeding tube placement.  EXAM: PORTABLE ABDOMEN - 1 VIEW  COMPARISON:  Chest radiograph 02/10/2015  FINDINGS: There are epicardial pacer wires. Nasogastric tube in the region the stomach body. There is a feeding tube which extends into the region of the gastric antrum. Nonobstructive bowel gas pattern. Limited evaluation of the lung bases.  IMPRESSION: Feeding tube tip in the region of the distal stomach.  Nasogastric tube tip in the gastric body region.   Electronically Signed   By: Richarda Overlie M.D.   On: 02/10/2015 10:17   Dg Vangie Bicker G Tube Plc W/fl-no Rad  02/10/2015   CLINICAL DATA:    NASO G TUBE PLACEMENT WITH FLUORO  Fluoroscopy was utilized by the requesting physician.  No radiographic  interpretation.      Medications:     Scheduled  Medications: . sodium chloride   Intravenous Once  . acetaminophen (TYLENOL) oral liquid 160 mg/5 mL  650 mg Oral Q6H  . antiseptic oral rinse  7 mL Mouth Rinse q12n4p  . aspirin EC  325 mg Oral Daily   Or  . aspirin  324 mg Per Tube Daily  . chlorhexidine  15 mL Mouth Rinse BID  . enoxaparin (LOVENOX) injection  40 mg Subcutaneous Q24H  . insulin aspart  0-24 Units Subcutaneous 6 times per day  . metoCLOPramide (REGLAN) injection  10 mg Intravenous 4 times per day  . mupirocin ointment  1 application Topical BID  . pantoprazole sodium  40 mg Per Tube Daily  . sodium chloride  10-40 mL Intracatheter Q12H  . sodium chloride  3 mL Intravenous Q12H  . sodium chloride  3 mL Intravenous Q12H  . vancomycin  500 mg Intravenous Q24H    Infusions: . Marland KitchenTPN (CLINIMIX-E) Adult 75 mL/hr at 02/10/15 1728  . sodium chloride 20 mL/hr at 02/11/15 0400  . sodium chloride    . dexmedetomidine 0.9 mcg/kg/hr (02/11/15 0529)  . dextrose Stopped (02/08/15 1800)  . epinephrine Stopped (02/06/15 1300)  . feeding supplement (VITAL 1.5 CAL) 1,000 mL (02/11/15 0224)  . furosemide (LASIX) infusion 4 mg/hr (02/11/15 0400)  . milrinone 0.3 mcg/kg/min (02/11/15 0400)  . norepinephrine (LEVOPHED) Adult infusion 8 mcg/min (02/11/15 0400)    PRN Medications: sodium chloride, albuterol, HYDROmorphone (DILAUDID) injection, iohexol, metoprolol, midazolam, ondansetron (ZOFRAN) IV, sodium chloride, sodium chloride, sodium chloride   Assessment:   1. Cardiogenic shock   --  s/p CABG with centrimag placement 02/01/15. Centrimag removed 9/30 2. CAD s/p CABG 3. 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 Acute respiratory failure 5. Acute blood loss anemia 6. Thrombocytopenia: HIT negative by SRA 7. Hyponatremia 8. Hypokalemia  Plan/Discussion:     Improving. Wean norepi as tolerated. Continue milrinone. Continue IV diuresis at least one more day.   Vent wean per CCM.   Adithi Gammon,MD 5:32 AM Advanced Heart Failure  Team Pager [phone] (M-F; 7a - 4p)  Please contact [facility] for night-coverage after hours (4p -7a ) and weekends on [email or web address]

## 2015-02-11 NOTE — Consult Note (Signed)
PULMONARY / CRITICAL CARE MEDICINE   Name: Breanna Alvarez MRN: [id number] DOB: 10/31/1950    ADMISSION DATE:  01/26/2015 CONSULTATION DATE:  10/5  REFERRING MD :  Donata Clay   CHIEF COMPLAINT:  Vent management   INITIAL PRESENTATION: 63yo female never smoker with hx HTN, CAD previously refused CABG ultimately admitted 9/20 for CVTS eval and CABG after ongoing chest pain.  She underwent 4V CABG 9/26 and has had complicated course with cardiogenic shock s/p centrimag placement and removal, ischemic cardiomyopathy with EF 20%, open sternum now s/p sternal closure 10/3.  She remains intubated since initial CABG 9/26 with poor weaning and PCCM now consulted to assist.   STUDIES:  9/21 2D echo>> EF 20-25%, mod MR, PA press 51 mmHg 9/30 TEE>>> EF 30-35%, hypokinesis of apical myocardium, akinesis of anteroseptal myocardium, mild TR, improved LV function    SIGNIFICANT EVENTS: 9/26 CABG x 4, placement of centrimag L ventricular assistive device  9/30 removal centrimag  10/3 sternal closure  HISTORY OF PRESENT ILLNESS:  63yo female never smoker with hx HTN, CAD previously refused CABG ultimately admitted 9/20 for CVTS eval and CABG after ongoing chest pain.  She underwent 4V CABG 9/26 and has had complicated course with cardiogenic shock s/p centrimag placement and removal, ischemic cardiomyopathy with EF 20%, open sternum now s/p sternal closure 10/3.  She remains intubated since initial CABG 9/26 with poor weaning and PCCM now consulted to assist.    PAST MEDICAL HISTORY :   has a past medical history of Hyperlipidemia; Acute systolic CHF (congestive heart failure), NYHA class 3 (HCC) (07/10/2014); and Hypertension.  has past surgical history that includes No past surgeries; left heart catheterization with coronary angiogram (N/A, 07/13/2014); Coronary artery bypass graft (N/A, 02/01/2015); TEE without cardioversion (N/A, 02/01/2015); Placement of centrimag ventricular assist device (N/A,  02/01/2015); Removal of centrimag ventricular assist device (N/A, 02/05/2015); Cannulation for cardiopulmonary bypass (N/A, 02/05/2015); TEE without cardioversion (N/A, 02/05/2015); Sternal closure (N/A, 02/08/2015); and TEE without cardioversion (N/A, 02/08/2015). Prior to Admission medications   Medication Sig Start Date End Date Taking? Authorizing Provider  aspirin 81 MG chewable tablet Chew 1 tablet (81 mg total) by mouth daily. 07/15/14  Yes Luke K Kilroy, PA-C  isosorbide mononitrate (IMDUR) 30 MG 24 hr tablet Take 1 tablet (30 mg total) by mouth 2 (two) times daily. 01/04/15  Yes Luke K Kilroy, PA-C  lisinopril (PRINIVIL,ZESTRIL) 2.5 MG tablet Take 1 tablet (2.5 mg total) by mouth daily. 07/15/14  Yes Luke K Kilroy, PA-C  metoprolol (LOPRESSOR) 50 MG tablet Take 1 tablet (50 mg total) by mouth 2 (two) times daily. Patient taking differently: Take 12.5 mg by mouth 2 (two) times daily.  10/07/14  Yes Corky Crafts, MD  nitroGLYCERIN (NITROSTAT) 0.4 MG SL tablet Place 1 tablet (0.4 mg total) under the tongue every 5 (five) minutes as needed for chest pain. 01/04/15  Yes Luke K Kilroy, PA-C  pravastatin (PRAVACHOL) 80 MG tablet Take 1 tablet (80 mg total) by mouth every evening. Patient taking differently: Take 40 mg by mouth every evening.  10/19/14  Yes Corky Crafts, MD   Allergies  Allergen Reactions  . Atorvastatin Other (See Comments)    Gas, chest tightness  . Crestor [Rosuvastatin Calcium] Other (See Comments)    Muscle aches    FAMILY HISTORY:  indicated that her mother is deceased. She indicated that her father is deceased.  SOCIAL HISTORY:  reports that she has never smoked. She has never used smokeless  tobacco. She reports that she does not drink alcohol or use illicit drugs.  REVIEW OF SYSTEMS:  Unable, pt sedated on vent.  Does nod yes when asked about SOB   SUBJECTIVE: Doing better on 10/5 PSV trials. Precedex off to wake her up but still appears confused.  VITAL  SIGNS: Temp:  [98.8 F (37.1 C)-100 F (37.8 C)] 99.9 F (37.7 C) (10/06 0800) Pulse Rate:  [95-117] 107 (10/06 0800) Resp:  [14-30] 19 (10/06 0800) BP: (82-113)/(34-79) 91/38 mmHg (10/06 0800) SpO2:  [99 %-100 %] 99 % (10/06 0800) Arterial Line BP: (70-135)/(37-61) 94/40 mmHg (10/06 0800) FiO2 (%):  [40 %] 40 % (10/06 0800) Weight:  [112 lb 10.5 oz (51.1 kg)] 112 lb 10.5 oz (51.1 kg) (10/06 0530) HEMODYNAMICS:   VENTILATOR SETTINGS: Vent Mode:  [-] PSV;CPAP FiO2 (%):  [40 %] 40 % Set Rate:  [14 bmp] 14 bmp Vt Set:  [500 mL] 500 mL PEEP:  [5 cmH20] 5 cmH20 Pressure Support:  [10 cmH20] 10 cmH20 Plateau Pressure:  [25 cmH20-30 cmH20] 30 cmH20 INTAKE / OUTPUT:  Intake/Output Summary (Last 24 hours) at 02/11/15 0918 Last data filed at 02/11/15 0800  Gross per 24 hour  Intake 4028.94 ml  Output   5730 ml  Net -1701.06 ml    PHYSICAL EXAMINATION: General:  Acutely ill appearing female, mild distress. Neuro:  Awake, follows some commands HEENT:  Moist mucus membranes, ETT Cardiovascular:  S1, S2, RRR, Lungs:  Clear, no Wheeze or crackles. Abdomen:  Round, soft, non tender, +bs  Musculoskeletal:  Warm and dry, generalized edema   LABS:  CBC  Recent Labs Lab 02/09/15 0409  02/10/15 0416 02/10/15 1642 02/11/15 0520  WBC 9.5  --  12.6*  --  11.0*  HGB 8.4*  < > 8.8* 8.8* 8.6*  HCT 24.7*  < > 25.8* 26.0* 25.1*  PLT 150  --  229  --  300  < > = values in this interval not displayed. Coag's  Recent Labs Lab 02/05/15 0400 02/05/15 1110 02/05/15 1606 02/05/15 2200  APTT 71* 68* 72* 48*  INR 1.60*  --   --  1.51*   BMET  Recent Labs Lab 02/09/15 0409  02/10/15 0416 02/10/15 1642 02/11/15 0520  NA 125*  < > 127* 130* 130*  K 3.6  < > 3.3* 3.7 3.5  CL 89*  < > 91* 90* 89*  CO2 27  --  28  --  30  BUN 29*  < > 36* 44* 41*  CREATININE 0.92  < > 1.03* 1.00 0.96  GLUCOSE 149*  < > 168* 175* 159*  < > = values in this interval not  displayed. Electrolytes  Recent Labs Lab 02/07/15 0309 02/08/15 0354  02/09/15 0409 02/10/15 0416 02/11/15 0520  CALCIUM 7.8* 7.9*  < > 7.8* 8.1* 8.2*  MG 1.9 1.6*  --   --   --  1.8  PHOS 4.7* 4.0  --   --   --  3.6  < > = values in this interval not displayed. Sepsis Markers No results for input(s): LATICACIDVEN, PROCALCITON, O2SATVEN in the last 168 hours. ABG  Recent Labs Lab 02/08/15 1804 02/09/15 0410 02/10/15 1014  PHART 7.422 7.473* 7.510*  PCO2ART 42.9 39.7 42.3  PO2ART 110.0* 119* 122.0*   Liver Enzymes  Recent Labs Lab 02/09/15 0409 02/10/15 0416 02/11/15 0520  AST 36 38 48*  ALT 30 31 40  ALKPHOS 121 126 192*  BILITOT 3.7* 3.9* 5.0*  ALBUMIN  2.1* 2.1* 2.0*   Cardiac Enzymes No results for input(s): TROPONINI, PROBNP in the last 168 hours. Glucose  Recent Labs Lab 02/10/15 1216 02/10/15 1629 02/10/15 1939 02/10/15 2350 02/11/15 0348 02/11/15 0839  GLUCAP 162* 147* 111* 124* 140* 131*    ASSESSMENT / PLAN:  PULMONARY OETT 9/26>>> Acute respiratory failure - multifactorial in setting heart failure, cardiogenic shock and severe deconditioning post multiple surgeries  P:   Cont daily attempts at PS wean as tol. Try 5/5 today. I believe she is getting close to extubation within the next day or 2.  Has some confusion and delirium. CXR shows some vascular congestion. Diuresis per cards  Nutrition as below  PRN bronchodilators  CARDIOVASCULAR CVL R IJ cortis 9/26>>> LUA PICC >>>  CAD s/p CABG  Acute systolic heart failure  Ischemic cardiomyopathy  Cardiogenic shock  P:  Off lasix drip. Transitioned to 20 mg IV bid. Weaning norepi  On milrinone per cards  Heart failure, CVTS following   RENAL Hyponatremia  AKI - mild  Hypokalemia  P:   F/u chem  Replete K PRN  GASTROINTESTINAL Protein calorie malnutrition  P:   TF per nutrition. Will get transitioned off the TPN today. PPI  HEMATOLOGIC Thrombocytopenia - improved   Anemia - mild P:  F/u CBC lovenox   INFECTIOUS Open sternum  P:   Wound (Sternum) 10/3>>> Sputum 10/4>>>   Vancomycin 10/5>>> Ceftaz 10/6>>   ENDOCRINE Hyperglycemia  P:   SSI  NEUROLOGIC Sedation requirement  P:   RASS goal: 0 Continue precedex for now. Will try haldol PRN for delirium. Follow qTC PRN pain rx   FAMILY  - Updates:   D/w Dr Maudie Flakes.  Critical care time 40 mins  Chilton Greathouse MD [facility] Pager [phone] If no answer or after 3pm call: [phone] 02/11/2015, 9:19 AM

## 2015-02-12 ENCOUNTER — Inpatient Hospital Stay (HOSPITAL_COMMUNITY): Payer: Medicaid Other

## 2015-02-12 DIAGNOSIS — Z951 Presence of aortocoronary bypass graft: Secondary | ICD-10-CM

## 2015-02-12 DIAGNOSIS — J9601 Acute respiratory failure with hypoxia: Secondary | ICD-10-CM

## 2015-02-12 DIAGNOSIS — J9600 Acute respiratory failure, unspecified whether with hypoxia or hypercapnia: Secondary | ICD-10-CM | POA: Insufficient documentation

## 2015-02-12 LAB — BLOOD GAS, ARTERIAL
ACID-BASE EXCESS: 3.6 mmol/L — AB (ref 0.0–2.0)
Acid-Base Excess: 3.3 mmol/L — ABNORMAL HIGH (ref 0.0–2.0)
Bicarbonate: 27.1 mEq/L — ABNORMAL HIGH (ref 20.0–24.0)
Bicarbonate: 27.7 mEq/L — ABNORMAL HIGH (ref 20.0–24.0)
DRAWN BY: 41977
Drawn by: 33100
FIO2: 0.4
FIO2: 0.4
MECHVT: 500 mL
O2 SAT: 97.9 %
O2 Saturation: 98.6 %
PCO2 ART: 42.4 mmHg (ref 35.0–45.0)
PEEP/CPAP: 5 cmH2O
PEEP: 5 cmH2O
PH ART: 7.43 (ref 7.350–7.450)
PO2 ART: 112 mmHg — AB (ref 80.0–100.0)
Patient temperature: 98.6
Patient temperature: 98.6
RATE: 14 resp/min
RATE: 14 resp/min
TCO2: 28.3 mmol/L (ref 0–100)
TCO2: 29 mmol/L (ref 0–100)
VT: 370 mL
pCO2 arterial: 39.4 mmHg (ref 35.0–45.0)
pH, Arterial: 7.452 — ABNORMAL HIGH (ref 7.350–7.450)
pO2, Arterial: 102 mmHg — ABNORMAL HIGH (ref 80.0–100.0)

## 2015-02-12 LAB — CBC
HCT: 23.3 % — ABNORMAL LOW (ref 36.0–46.0)
Hemoglobin: 7.8 g/dL — ABNORMAL LOW (ref 12.0–15.0)
MCH: 29.8 pg (ref 26.0–34.0)
MCHC: 33.5 g/dL (ref 30.0–36.0)
MCV: 88.9 fL (ref 78.0–100.0)
Platelets: 335 10*3/uL (ref 150–400)
RBC: 2.62 MIL/uL — ABNORMAL LOW (ref 3.87–5.11)
RDW: 16.3 % — ABNORMAL HIGH (ref 11.5–15.5)
WBC: 11.5 10*3/uL — ABNORMAL HIGH (ref 4.0–10.5)

## 2015-02-12 LAB — COMPREHENSIVE METABOLIC PANEL
ALT: 210 U/L — ABNORMAL HIGH (ref 14–54)
AST: 227 U/L — ABNORMAL HIGH (ref 15–41)
Albumin: 2.1 g/dL — ABNORMAL LOW (ref 3.5–5.0)
Alkaline Phosphatase: 337 U/L — ABNORMAL HIGH (ref 38–126)
Anion gap: 12 (ref 5–15)
BUN: 47 mg/dL — ABNORMAL HIGH (ref 6–20)
CO2: 30 mmol/L (ref 22–32)
Calcium: 8.4 mg/dL — ABNORMAL LOW (ref 8.9–10.3)
Chloride: 93 mmol/L — ABNORMAL LOW (ref 101–111)
Creatinine, Ser: 1.31 mg/dL — ABNORMAL HIGH (ref 0.44–1.00)
GFR calc Af Amer: 49 mL/min — ABNORMAL LOW (ref >60)
GFR calc non Af Amer: 43 mL/min — ABNORMAL LOW (ref >60)
Glucose, Bld: 157 mg/dL — ABNORMAL HIGH (ref 65–99)
Potassium: 3.8 mmol/L (ref 3.5–5.1)
Sodium: 135 mmol/L (ref 135–145)
Total Bilirubin: 4.5 mg/dL — ABNORMAL HIGH (ref 0.3–1.2)
Total Protein: 5.8 g/dL — ABNORMAL LOW (ref 6.5–8.1)

## 2015-02-12 LAB — CULTURE, RESPIRATORY W GRAM STAIN
Culture: NO GROWTH
Special Requests: NORMAL

## 2015-02-12 LAB — GLUCOSE, CAPILLARY
GLUCOSE-CAPILLARY: 145 mg/dL — AB (ref 65–99)
GLUCOSE-CAPILLARY: 192 mg/dL — AB (ref 65–99)
Glucose-Capillary: 169 mg/dL — ABNORMAL HIGH (ref 65–99)
Glucose-Capillary: 122 mg/dL — ABNORMAL HIGH (ref 65–99)
Glucose-Capillary: 103 mg/dL — ABNORMAL HIGH (ref 65–99)
Glucose-Capillary: 153 mg/dL — ABNORMAL HIGH (ref 65–99)

## 2015-02-12 LAB — CARBOXYHEMOGLOBIN
Carboxyhemoglobin: 1.9 % — ABNORMAL HIGH (ref 0.5–1.5)
Methemoglobin: 1.1 % (ref 0.0–1.5)
O2 Saturation: 75 %
Total hemoglobin: 7.6 g/dL — ABNORMAL LOW (ref 12.0–16.0)

## 2015-02-12 LAB — TYPE AND SCREEN
ABO/RH(D): O POS
Antibody Screen: NEGATIVE
Unit division: 0
Unit division: 0

## 2015-02-12 LAB — PROTIME-INR
INR: 1.56 — ABNORMAL HIGH (ref 0.00–1.49)
Prothrombin Time: 18.7 seconds — ABNORMAL HIGH (ref 11.6–15.2)

## 2015-02-12 LAB — MAGNESIUM: MAGNESIUM: 1.8 mg/dL (ref 1.7–2.4)

## 2015-02-12 LAB — PHOSPHORUS: PHOSPHORUS: 3.8 mg/dL (ref 2.5–4.6)

## 2015-02-12 MED ORDER — CLOTRIMAZOLE 2 % VA CREA
1.0000 | TOPICAL_CREAM | Freq: Every day | VAGINAL | Status: DC
  Filled 2015-02-12: qty 21

## 2015-02-12 MED ORDER — FUROSEMIDE 10 MG/ML IJ SOLN
40.0000 mg | Freq: Two times a day (BID) | INTRAMUSCULAR | Status: DC
  Filled 2015-02-12 (×2): qty 4

## 2015-02-12 MED ORDER — CLOTRIMAZOLE 1 % VA CREA
1.0000 | TOPICAL_CREAM | Freq: Every day | VAGINAL | Status: AC
  Administered 2015-02-12 – 2015-02-14 (×3): 1 via VAGINAL
  Filled 2015-02-12: qty 45

## 2015-02-12 MED ORDER — SODIUM CHLORIDE 0.9 % IV SOLN
25.0000 ug/h | INTRAVENOUS | Status: DC
  Administered 2015-02-12: 100 ug/h via INTRAVENOUS
  Filled 2015-02-12: qty 50

## 2015-02-12 MED ORDER — FENTANYL CITRATE (PF) 100 MCG/2ML IJ SOLN
INTRAMUSCULAR | Status: AC
  Administered 2015-02-12: 100 ug
  Filled 2015-02-12: qty 2

## 2015-02-12 MED ORDER — MIDAZOLAM HCL 2 MG/2ML IJ SOLN
INTRAMUSCULAR | Status: AC
  Administered 2015-02-12: 2 mg
  Filled 2015-02-12: qty 2

## 2015-02-12 MED ORDER — MIDAZOLAM HCL 2 MG/2ML IJ SOLN
1.0000 mg | INTRAMUSCULAR | Status: DC | PRN
  Administered 2015-02-12 – 2015-02-16 (×11): 2 mg via INTRAVENOUS
  Filled 2015-02-12 (×11): qty 2

## 2015-02-12 MED ORDER — DEXMEDETOMIDINE HCL IN NACL 200 MCG/50ML IV SOLN
0.4000 ug/kg/h | INTRAVENOUS | Status: DC
Start: 2015-02-12 — End: 2015-02-12
  Administered 2015-02-12: 0.4 ug/kg/h via INTRAVENOUS
  Filled 2015-02-12: qty 50

## 2015-02-12 MED ORDER — ANTISEPTIC ORAL RINSE SOLUTION (CORINZ)
7.0000 mL | Freq: Four times a day (QID) | OROMUCOSAL | Status: DC
  Administered 2015-02-13 – 2015-03-14 (×120): 7 mL via OROMUCOSAL

## 2015-02-12 MED ORDER — CHLORHEXIDINE GLUCONATE 0.12% ORAL RINSE (MEDLINE KIT)
15.0000 mL | Freq: Two times a day (BID) | OROMUCOSAL | Status: DC
  Administered 2015-02-12 – 2015-03-15 (×57): 15 mL via OROMUCOSAL

## 2015-02-12 MED ORDER — DEXMEDETOMIDINE HCL IN NACL 200 MCG/50ML IV SOLN
0.4000 ug/kg/h | INTRAVENOUS | Status: DC
  Administered 2015-02-13: 0.2 ug/kg/h via INTRAVENOUS
  Administered 2015-02-13: 0.4 ug/kg/h via INTRAVENOUS
  Administered 2015-02-14: 0.9 ug/kg/h via INTRAVENOUS
  Administered 2015-02-14: 1 ug/kg/h via INTRAVENOUS
  Administered 2015-02-14: 1.2 ug/kg/h via INTRAVENOUS
  Administered 2015-02-14 (×2): 1 ug/kg/h via INTRAVENOUS
  Administered 2015-02-15: 1.2 ug/kg/h via INTRAVENOUS
  Administered 2015-02-15: 0.7 ug/kg/h via INTRAVENOUS
  Administered 2015-02-15: 1.2 ug/kg/h via INTRAVENOUS
  Administered 2015-02-15 – 2015-02-17 (×9): 1 ug/kg/h via INTRAVENOUS
  Administered 2015-02-17: 1.2 ug/kg/h via INTRAVENOUS
  Filled 2015-02-12 (×21): qty 50

## 2015-02-12 NOTE — Progress Notes (Signed)
Patient re-intubated today and Fentanyl started for sedation purposes. Previous nurse that has taken care of patient for past few days, Catalina Lunger informed me that patient started to hallucinate last time she had Fentanyl for sedation. Called Elink MD and requested change in the sedation to Precedex. Will switch sedation and continue to monitor.  Domenica Fail, RN

## 2015-02-12 NOTE — Progress Notes (Signed)
Patient extubated at 1200 this afternoon. Started having copious amounts of secretions and not able to protect airway. CCM was contacted and decision was made to reintubate patient. Patient was given of Fentanyl,  Versed,  Etomidate and Rocuronium per MD order. Endotracheal tube was placed at 1404 with glidescope per Dr. Molli Knock. ETT 7.5 at 21cm at the teeth. A-line insertion was attempted and failed. Will continue to monitor.  Domenica Fail, RN

## 2015-02-12 NOTE — Progress Notes (Signed)
Patient ID: Breanna Alvarez, female   DOB: [birth year], 64 y.o.   MRN: [id number]  SICU Evening Rounds:  Hemodynamically stable on Milrinone 0.25  Extubated earlier today and had to be reintubated a couple hrs later.  Urine output ok.

## 2015-02-12 NOTE — Procedures (Signed)
Extubation Procedure Note  Patient Details:   Name: ZAELYNN FUCHS DOB: [birth year] MRN: [id number]   Airway Documentation:     Evaluation  O2 sats: stable throughout Complications: No apparent complications Patient did tolerate procedure well. Bilateral Breath Sounds: Clear, Diminished Suctioning: Airway Yes  Patient tolerated wean. MD ordered to extubate. Positive for cuff leak. Patient extubated to a 4 Lpm nasal cannula. Patient a little tachypneic due to excessive coughing post intubation. Patient instructed on the Incentive Spirometer achieving 250 mL five times. RN at bedside. Patient more comfortable at this time. Will continue to monitor.   Ancil Boozer 02/12/2015, 12:25 PM

## 2015-02-12 NOTE — Progress Notes (Signed)
eLink Physician-Brief Progress Note Patient Name: Breanna Alvarez DOB: Feb 05, 1951 MRN: [id number]   Date of Service  02/12/2015  HPI/Events of Note  Reintubated and ventilated. History of hallucinations with Fentanyl.   eICU Interventions  Will order a Precedex IV infusion. Titrate to RASS 0 to -1.     Intervention Category Minor Interventions: Agitation / anxiety - evaluation and management  Sommer,Steven Eugene 02/12/2015, 6:21 PM

## 2015-02-12 NOTE — Progress Notes (Signed)
Wasted of Fentanyl down sink with Lynetta Mare, RN.  Domenica Fail, RN

## 2015-02-12 NOTE — Progress Notes (Signed)
Advanced Heart Failure Rounding Note   Subjective:    S/p CABG with Centrimag placement 02/01/15  Centrimag explanted 9/30.   Sternal Closure 10/3.    Extubated today but lasted 2 hour and had to be reintubated. On milrinone. Off norepi. Co-ox 75%  Weight stable at 112   Objective:   Weight Range:  Vital Signs:   Temp:  [98.8 F (37.1 C)-100.7 F (38.2 C)] 99 F (37.2 C) (10/07 1600) Pulse Rate:  [98-129] 114 (10/07 1800) Resp:  [0-32] 20 (10/07 1800) BP: (82-144)/(36-115) 96/39 mmHg (10/07 1800) SpO2:  [96 %-100 %] 100 % (10/07 1800) FiO2 (%):  [40 %] 40 % (10/07 1559) Weight:  [50.9 kg (112 lb 3.4 oz)] 50.9 kg (112 lb 3.4 oz) (10/07 0500) Last BM Date: 02/12/15  Weight change: Filed Weights   02/10/15 0500 02/11/15 0530 02/12/15 0500  Weight: 51.9 kg (114 lb 6.7 oz) 51.1 kg (112 lb 10.5 oz) 50.9 kg (112 lb 3.4 oz)    Intake/Output:   Intake/Output Summary (Last 24 hours) at 02/12/15 1831 Last data filed at 02/12/15 1815  Gross per 24 hour  Intake 1703.73 ml  Output    760 ml  Net 943.73 ml     Physical Exam:  General:  Intubated. Awake HEENT: normal x ETT Neck: supple. RIJ swan JVP Cor: Chest with dressing. Tachy regular Lungs: clear Abdomen: soft, nontender, nondistended. Hypoactive bowel sounds. Extremities: no cyanosis, clubbing, rash,  2+ edema Neuro: awake on vent    Telemetry: Sinus tach 110  Labs: Basic Metabolic Panel:  Recent Labs Lab 02/06/15 1638  02/07/15 0309 02/08/15 0354 02/08/15 1759 02/09/15 0409  02/10/15 0416 02/10/15 1642 02/11/15 0520 02/11/15 1607 02/12/15 0304  NA  --   < > 128* 129* 126* 125*  < > 127* 130* 130* 132* 135  K  --   < > 3.7 3.3* 3.0* 3.6  < > 3.3* 3.7 3.5 3.6 3.8  CL  --   < > 98* 92* 90* 89*  < > 91* 90* 89* 92* 93*  CO2  --   --  21* --  28  --  30  --  30  GLUCOSE  --   < > 186* 144* 193* 149*  < > 168* 175* 159* 192* 157*  BUN  --   < > 22* 24* 24* 29*  < > 36* 44* 41* 42* 47*    CREATININE 1.13*  < > 1.08* 0.88 1.04* 0.92  < > 1.03* 1.00 0.96 1.10* 1.31*  CALCIUM  --   --  7.8* 7.9* 7.9* 7.8*  --  8.1*  --  8.2*  --  8.4*  MG 2.2  --  1.9 1.6*  --   --   --   --   --  1.8  --  1.8  PHOS  --   --  4.7* 4.0  --   --   --   --   --  3.6  --  3.8  < > = values in this interval not displayed.  Liver Function Tests:  Recent Labs Lab 02/08/15 0354 02/09/15 0409 02/10/15 0416 02/11/15 0520 02/12/15 0304  AST 44* 36 38 48* 227*  ALT 33 30 31 40 210*  ALKPHOS 94 121 126 192* 337*  BILITOT 2.6* 3.7* 3.9* 5.0* 4.5*  PROT 5.4* 5.1* 5.7* 5.4* 5.8*  ALBUMIN 2.3* 2.1* 2.1* 2.0* 2.1*   No results for input(s): LIPASE, AMYLASE in  the last 168 hours. No results for input(s): AMMONIA in the last 168 hours.  CBC:  Recent Labs Lab 02/07/15 0309 02/08/15 0354 02/08/15 1759 02/09/15 0409  02/10/15 0416 02/10/15 1642 02/11/15 0520 02/11/15 1607 02/12/15 0304  WBC 13.9* 9.8 9.6 9.5  --  12.6*  --  11.0*  --  11.5*  NEUTROABS 10.0* 6.6  --   --   --   --   --   --   --   --   HGB 9.9* 9.2* 9.1* 8.4*  < > 8.8* 8.8* 8.6* 9.2* 7.8*  HCT 28.4* 27.3* 27.0* 24.7*  < > 25.8* 26.0* 25.1* 27.0* 23.3*  MCV 86.6 86.9 86.3 87.3  --  86.6  --  88.1  --  88.9  PLT 105* 123* 136* 150  --  229  --  300  --  335  < > = values in this interval not displayed.  Cardiac Enzymes: No results for input(s): CKTOTAL, CKMB, CKMBINDEX, TROPONINI in the last 168 hours.  BNP: BNP (last 3 results)  Recent Labs  07/10/14 1315  BNP 1059.1*    ProBNP (last 3 results) No results for input(s): PROBNP in the last 8760 hours.    Other results:  Imaging: Dg Chest Port 1 View  02/12/2015   CLINICAL DATA:  Status post endotracheal tube placement  EXAM: PORTABLE CHEST - 1 VIEW  COMPARISON:  02/12/2015  FINDINGS: Cardiac shadow is mildly prominent. A left-sided PICC line is again seen in satisfactory position. Feeding catheter is noted in the stomach. Endotracheal tube is seen with the tip  2.2 cm above the carina. Patchy changes are again identified throughout both lungs. No new focal abnormality is seen.  IMPRESSION: Tubes and lines as described.  Bilateral infiltrates similar to that seen on the prior exam.   Electronically Signed   By: Alcide Clever M.D.   On: 02/12/2015 14:36   Dg Chest Port 1 View  02/12/2015   CLINICAL DATA:  Status post CABG on February 01, 2015  EXAM: PORTABLE CHEST 1 VIEW  COMPARISON:  Portable chest x-ray of February 11, 2015.  FINDINGS: The lungs are adequately inflated. The interstitial markings remain increased. There is subtle airspace opacity in the right mid lung. Minimal left lower lobe subsegmental atelectasis persists. There is a is trace of pleural fluid on the left. The cardiac silhouette remains enlarged. The pulmonary vascularity is more normal today. The endotracheal tube tip lies 4.4 cm above the carina. The esophagogastric tube and feeding tube tips project below the inferior margin of the image. The left-sided PICC line tip projects at the junction of the middle and distal thirds of the SVC. There are 7 intact sternal wires.  IMPRESSION: Slight interval improvement in the appearance of the pulmonary interstitium. Persistent mild enlargement of the cardiac silhouette. Stable right midlung airspace opacity and mild left lower lobe atelectasis.   Electronically Signed   By: David  [place] M.D.   On: 02/12/2015 07:50   Dg Chest Portable 1 View  02/11/2015   CLINICAL DATA:  CABG.  Respiratory failure.  EXAM: PORTABLE CHEST 1 VIEW  COMPARISON:  02/10/2015.  FINDINGS: Endotracheal tube, NG tube, feeding tube, right IJ sheath, left subclavian central line in stable position. Interim removal of Swan-Ganz catheter. Interim removal of bilateral chest tubes. Small pleural density noted at the site of the left chest tube. Cardiomegaly with mild pulmonary interstitial prominence suggesting mild congestive heart failure.  IMPRESSION: 1. Interim removal of bilateral  chest  tubes and Swan-Ganz catheter. Remaining lines and tubes in stable position. No pneumothorax.  2. Prior CABG. Cardiomegaly with new onset of mild pulmonary interstitial prominence suggesting a mild component of congestive heart failure.   Electronically Signed   By: Maisie Fus  Register   On: 02/11/2015 07:33     Medications:     Scheduled Medications: . sodium chloride   Intravenous Once  . acetaminophen (TYLENOL) oral liquid 160 mg/5 mL  650 mg Oral Q6H  . antiseptic oral rinse  7 mL Mouth Rinse q12n4p  . aspirin EC  325 mg Oral Daily   Or  . aspirin  324 mg Per Tube Daily  . cefTAZidime (FORTAZ)  IV  1 g Intravenous Q12H  . chlorhexidine  15 mL Mouth Rinse BID  . clotrimazole  1 Applicatorful Vaginal QHS  . enoxaparin (LOVENOX) injection  40 mg Subcutaneous Q24H  . furosemide  20 mg Intravenous BID  . insulin aspart  0-24 Units Subcutaneous 6 times per day  . metoCLOPramide (REGLAN) injection  10 mg Intravenous 4 times per day  . mupirocin ointment  1 application Topical BID  . pantoprazole sodium  40 mg Per Tube Daily  . QUEtiapine  50 mg Oral QHS  . sodium chloride  10-40 mL Intracatheter Q12H  . sodium chloride  3 mL Intravenous Q12H  . vancomycin  500 mg Intravenous Q24H    Infusions: . sodium chloride 20 mL/hr at 02/12/15 1800  . sodium chloride    . dexmedetomidine    . feeding supplement (VITAL AF 1.2 CAL) 1,000 mL (02/12/15 1800)  . milrinone 0.3 mcg/kg/min (02/12/15 1800)  . norepinephrine (LEVOPHED) Adult infusion Stopped (02/12/15 0156)    PRN Medications: sodium chloride, albuterol, haloperidol lactate, HYDROmorphone (DILAUDID) injection, metoprolol, midazolam, ondansetron (ZOFRAN) IV, sodium chloride, sodium chloride   Assessment:   1. Cardiogenic shock   --s/p CABG with centrimag placement 02/01/15. Centrimag removed 9/30 2. CAD s/p CABG 3. 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 Acute respiratory failure 5. Acute blood loss anemia 6. Thrombocytopenia: HIT negative by  SRA 7. Hyponatremia 8. Hypokalemia  Plan/Discussion:     Failed extubation today. Co-ox looks good on milrinone. Will reduce dose to 0.25. Continue to diurese as tolerated - will increase lasix to 40 bid. Still about 10 pounds up from baseline.     Zander Ingham,MD 6:31 PM Advanced Heart Failure Team Pager [phone] (M-F; 7a - 4p)  Please contact [facility] for night-coverage after hours (4p -7a ) and weekends on [email or web address]

## 2015-02-12 NOTE — Procedures (Signed)
Intubation Procedure Note Breanna Alvarez [id number] November 15, 1950  Procedure: Intubation Indications: Airway protection and maintenance  Procedure Details Consent: Risks of procedure as well as the alternatives and risks of each were explained to the (patient/caregiver).  Consent for procedure obtained. Time Out: Verified patient identification, verified procedure, site/side was marked, verified correct patient position, special equipment/implants available, medications/allergies/relevent history reviewed, required imaging and test results available.  Performed  Maximum sterile technique was used including gloves, hand hygiene and mask.  MAC    Evaluation Hemodynamic Status: BP stable throughout; O2 sats: stable throughout Patient's Current Condition: stable Complications: No apparent complications Patient did tolerate procedure well. Chest X-ray ordered to verify placement.  CXR: pending.   Marek Nghiem 02/12/2015

## 2015-02-12 NOTE — Procedures (Signed)
Arterial Catheter Insertion Procedure Note Breanna Alvarez [id number] [birth year]  Procedure: Insertion of Arterial Catheter  Indications: Blood pressure monitoring and Frequent blood sampling  Procedure Details Consent: Unable to obtain consent because of altered level of consciousness. Time Out: Verified patient identification, verified procedure, site/side was marked, verified correct patient position, special equipment/implants available, medications/allergies/relevent history reviewed, required imaging and test results available.  Performed  Maximum sterile technique was used including antiseptics, cap, gloves, gown, hand hygiene, mask and sheet. Skin prep: Chlorhexidine; local anesthetic administered 20 gauge catheter was inserted into right radial artery using the Seldinger technique.  Evaluation Blood flow good; BP tracing good. Complications: No apparent complications.   YACOUB,WESAM 02/12/2015

## 2015-02-12 NOTE — Procedures (Addendum)
Intubation Procedure Note Breanna Alvarez [id number] [birth year]  Procedure: Intubation Indications: Airway protection and maintenance  Procedure Details Consent: Risks of procedure as well as the alternatives and risks of each were explained to the (patient/caregiver).  Consent for procedure obtained. Time Out: Verified patient identification, verified procedure, site/side was marked, verified correct patient position, special equipment/implants available, medications/allergies/relevent history reviewed, required imaging and test results available.  Performed  Maximum sterile technique was used including gloves, hand hygiene and mask.  MAC and 3    Evaluation Hemodynamic Status: BP stable throughout; O2 sats: stable throughout Patient's Current Condition: stable Complications: No apparent complications Patient did tolerate procedure well. Chest X-ray ordered to verify placement.  CXR: pending.   Ancil Boozer 02/12/2015

## 2015-02-12 NOTE — Evaluation (Signed)
SLP Cancellation Note  Patient Details Name: Breanna Alvarez MRN: [id number] DOB: 1950-12-27   Cancelled treatment:       Reason Eval/Treat Not Completed: Medical issues which prohibited therapy (pt reintubated this evening, will follow for readiness) Donavan Burnet, MS [facility] SLP [phone]

## 2015-02-12 NOTE — Progress Notes (Signed)
Patient ID: Breanna Alvarez, female   DOB: November 27, 1950, 64 y.o.   MRN: [id number] T[facility] DAILY ICU PROGRESS NOTE                   [address]            [place] [address]          [phone]   4 Days Post-Op Procedure(s) (LRB): STERNAL CLOSURE (N/A) TRANSESOPHAGEAL ECHOCARDIOGRAM (TEE) (N/A)  Total Length of Stay:  LOS: 17 days   Subjective: Failed extubation, was extubated but only lasted several hours and was reentubated  Objective: Vital signs in last 24 hours: Temp:  [98.8 F (37.1 C)-100.8 F (38.2 C)] 98.9 F (37.2 C) (10/07 1252) Pulse Rate:  [98-131] 124 (10/07 1445) Cardiac Rhythm:  [-] Sinus tachycardia (10/07 1400) Resp:  [0-32] 18 (10/07 1445) BP: (82-144)/(36-115) 131/67 mmHg (10/07 1445) SpO2:  [96 %-100 %] 99 % (10/07 1445) Arterial Line BP: (111-130)/(44-52) 130/47 mmHg (10/06 1600) FiO2 (%):  [40 %] 40 % (10/07 1145) Weight:  [112 lb 3.4 oz (50.9 kg)] 112 lb 3.4 oz (50.9 kg) (10/07 0500)  Filed Weights   02/10/15 0500 02/11/15 0530 02/12/15 0500  Weight: 114 lb 6.7 oz (51.9 kg) 112 lb 10.5 oz (51.1 kg) 112 lb 3.4 oz (50.9 kg)    Weight change: -7.1 oz (-0.2 kg)   Hemodynamic parameters for last 24 hours:    Intake/Output from previous day: 10/06 0701 - 10/07 0700 In: 2650.3 [I.V.:672.9; NG/GT:1402.3; IV Piggyback:500; TPN:75] Out: 1496 [Urine:1495; Stool:1]  Intake/Output this shift: Total I/O In: 494.7 [I.V.:189.7; NG/GT:255; IV Piggyback:50] Out: 385 [Urine:385]  Current Meds: Scheduled Meds: . sodium chloride   Intravenous Once  . acetaminophen (TYLENOL) oral liquid 160 mg/5 mL  650 mg Oral Q6H  . antiseptic oral rinse  7 mL Mouth Rinse q12n4p  . aspirin EC  325 mg Oral Daily   Or  . aspirin  324 mg Per Tube Daily  . cefTAZidime (FORTAZ)  IV  1 g Intravenous Q12H  . chlorhexidine  15 mL Mouth Rinse BID  . enoxaparin (LOVENOX) injection  40 mg Subcutaneous Q24H  . furosemide  20 mg Intravenous BID  . insulin aspart   0-24 Units Subcutaneous 6 times per day  . metoCLOPramide (REGLAN) injection  10 mg Intravenous 4 times per day  . mupirocin ointment  1 application Topical BID  . pantoprazole sodium  40 mg Per Tube Daily  . QUEtiapine  50 mg Oral QHS  . sodium chloride  10-40 mL Intracatheter Q12H  . sodium chloride  3 mL Intravenous Q12H  . vancomycin  500 mg Intravenous Q24H   Continuous Infusions: . sodium chloride 20 mL/hr at 02/12/15 1300  . sodium chloride    . feeding supplement (VITAL AF 1.2 CAL) 1,000 mL (02/12/15 1306)  . milrinone 0.3 mcg/kg/min (02/12/15 1300)  . norepinephrine (LEVOPHED) Adult infusion Stopped (02/12/15 0156)   PRN Meds:.sodium chloride, albuterol, haloperidol lactate, HYDROmorphone (DILAUDID) injection, metoprolol, ondansetron (ZOFRAN) IV, sodium chloride, sodium chloride    Lab Results: CBC: Recent Labs  02/11/15 0520 02/11/15 1607 02/12/15 0304  WBC 11.0*  --  11.5*  HGB 8.6* 9.2* 7.8*  HCT 25.1* 27.0* 23.3*  PLT 300  --  335   BMET:  Recent Labs  02/11/15 0520 02/11/15 1607 02/12/15 0304  NA 130* 132* 135  K 3.5 3.6 3.8  CL 89* 92* 93*  CO2 30  --  30  GLUCOSE 159*  192* 157*  BUN 41* 42* 47*  CREATININE 0.96 1.10* 1.31*  CALCIUM 8.2*  --  8.4*    PT/INR:  Recent Labs  02/12/15 1010  LABPROT 18.7*  INR 1.56*   Radiology: Dg Chest Port 1 View  02/12/2015   CLINICAL DATA:  Status post endotracheal tube placement  EXAM: PORTABLE CHEST - 1 VIEW  COMPARISON:  02/12/2015  FINDINGS: Cardiac shadow is mildly prominent. A left-sided PICC line is again seen in satisfactory position. Feeding catheter is noted in the stomach. Endotracheal tube is seen with the tip 2.2 cm above the carina. Patchy changes are again identified throughout both lungs. No new focal abnormality is seen.  IMPRESSION: Tubes and lines as described.  Bilateral infiltrates similar to that seen on the prior exam.   Electronically Signed   By: Alcide Clever M.D.   On: 02/12/2015 14:36    Dg Chest Port 1 View  02/12/2015   CLINICAL DATA:  Status post CABG on February 01, 2015  EXAM: PORTABLE CHEST 1 VIEW  COMPARISON:  Portable chest x-ray of February 11, 2015.  FINDINGS: The lungs are adequately inflated. The interstitial markings remain increased. There is subtle airspace opacity in the right mid lung. Minimal left lower lobe subsegmental atelectasis persists. There is a is trace of pleural fluid on the left. The cardiac silhouette remains enlarged. The pulmonary vascularity is more normal today. The endotracheal tube tip lies 4.4 cm above the carina. The esophagogastric tube and feeding tube tips project below the inferior margin of the image. The left-sided PICC line tip projects at the junction of the middle and distal thirds of the SVC. There are 7 intact sternal wires.  IMPRESSION: Slight interval improvement in the appearance of the pulmonary interstitium. Persistent mild enlargement of the cardiac silhouette. Stable right midlung airspace opacity and mild left lower lobe atelectasis.   Electronically Signed   By: David  [place] M.D.   On: 02/12/2015 07:50     Assessment/Plan: S/P Procedure(s) (LRB): STERNAL CLOSURE (N/A) TRANSESOPHAGEAL ECHOCARDIOGRAM (TEE) (N/A) Likely will need trach next week      Breanna Alvarez 02/12/2015 3:10 PM

## 2015-02-12 NOTE — Progress Notes (Addendum)
PULMONARY / CRITICAL CARE MEDICINE   Name: Breanna Alvarez MRN: [id number] DOB: Sep 23, 1950    ADMISSION DATE:  01/26/2015 CONSULTATION DATE:  10/5  REFERRING MD :  Donata Clay   CHIEF COMPLAINT:  Vent management   INITIAL PRESENTATION: 64yo female never smoker with hx HTN, CAD previously refused CABG ultimately admitted 9/20 for CVTS eval and CABG after ongoing chest pain.  She underwent 4V CABG 9/26 and has had complicated course with cardiogenic shock s/p centrimag placement and removal, ischemic cardiomyopathy with EF 20%, open sternum now s/p sternal closure 10/3.  She remains intubated since initial CABG 9/26 with poor weaning and PCCM now consulted to assist.   STUDIES:  9/21 2D echo>> EF 20-25%, mod MR, PA press 51 mmHg 9/30 TEE>>> EF 30-35%, hypokinesis of apical myocardium, akinesis of anteroseptal myocardium, mild TR, improved LV function    SIGNIFICANT EVENTS: 9/26 CABG x 4, placement of centrimag L ventricular assistive device  9/30 removal centrimag  10/3 sternal closure  HISTORY OF PRESENT ILLNESS:  64yo female never smoker with hx HTN, CAD previously refused CABG ultimately admitted 9/20 for CVTS eval and CABG after ongoing chest pain.  She underwent 4V CABG 9/26 and has had complicated course with cardiogenic shock s/p centrimag placement and removal, ischemic cardiomyopathy with EF 20%, open sternum now s/p sternal closure 10/3.  She remains intubated since initial CABG 9/26 with poor weaning and PCCM now consulted to assist.   SUBJECTIVE:  Precedex off, last dilaudid was at 02:00 Tolerating high PSV but RSBI increases on PS 5  VITAL SIGNS: Temp:  [98.8 F (37.1 C)-100.8 F (38.2 C)] 98.8 F (37.1 C) (10/07 0809) Pulse Rate:  [104-131] 122 (10/07 0900) Resp:  [0-32] 32 (10/07 0900) BP: (82-135)/(36-94) 112/47 mmHg (10/07 0900) SpO2:  [97 %-100 %] 100 % (10/07 0900) Arterial Line BP: (91-183)/(38-83) 130/47 mmHg (10/06 1600) FiO2 (%):  [40 %] 40 % (10/07  0907) Weight:  [50.9 kg (112 lb 3.4 oz)] 50.9 kg (112 lb 3.4 oz) (10/07 0500) HEMODYNAMICS:   VENTILATOR SETTINGS: Vent Mode:  [-] PSV;CPAP FiO2 (%):  [40 %] 40 % Set Rate:  [14 bmp] 14 bmp Vt Set:  [500 mL] 500 mL PEEP:  [5 cmH20] 5 cmH20 Pressure Support:  [8 cmH20-20 cmH20] 20 cmH20 Plateau Pressure:  [26 cmH20-30 cmH20] 30 cmH20 INTAKE / OUTPUT:  Intake/Output Summary (Last 24 hours) at 02/12/15 4098 Last data filed at 02/12/15 0900  Gross per 24 hour  Intake 2394.65 ml  Output   1306 ml  Net 1088.65 ml    PHYSICAL EXAMINATION: General:  Acutely ill appearing female, mild distress. Neuro:  Awake, follows commands HEENT:  Moist mucus membranes, ETT Cardiovascular:  S1, S2, RRR, Lungs:  Clear, no Wheeze or crackles. Abdomen:  Round, soft, non tender, +bs  Musculoskeletal:  Warm and dry, generalized edema   LABS:  CBC  Recent Labs Lab 02/10/15 0416  02/11/15 0520 02/11/15 1607 02/12/15 0304  WBC 12.6*  --  11.0*  --  11.5*  HGB 8.8*  < > 8.6* 9.2* 7.8*  HCT 25.8*  < > 25.1* 27.0* 23.3*  PLT 229  --  300  --  335  < > = values in this interval not displayed. Coag's  Recent Labs Lab 02/05/15 1110 02/05/15 1606 02/05/15 2200  APTT 68* 72* 48*  INR  --   --  1.51*   BMET  Recent Labs Lab 02/10/15 0416  02/11/15 0520 02/11/15 1607 02/12/15 0304  NA  127*  < > 130* 132* 135  K 3.3*  < > 3.5 3.6 3.8  CL 91*  < > 89* 92* 93*  CO2 28  --  30  --  30  BUN 36*  < > 41* 42* 47*  CREATININE 1.03*  < > 0.96 1.10* 1.31*  GLUCOSE 168*  < > 159* 192* 157*  < > = values in this interval not displayed. Electrolytes  Recent Labs Lab 02/08/15 0354  02/10/15 0416 02/11/15 0520 02/12/15 0304  CALCIUM 7.9*  < > 8.1* 8.2* 8.4*  MG 1.6*  --   --  1.8 1.8  PHOS 4.0  --   --  3.6 3.8  < > = values in this interval not displayed. Sepsis Markers No results for input(s): LATICACIDVEN, PROCALCITON, O2SATVEN in the last 168 hours. ABG  Recent Labs Lab  02/09/15 0410 02/10/15 1014 02/12/15 0528  PHART 7.473* 7.510* 7.452*  PCO2ART 39.7 42.3 39.4  PO2ART 119* 122.0* 112*   Liver Enzymes  Recent Labs Lab 02/10/15 0416 02/11/15 0520 02/12/15 0304  AST 38 48* 227*  ALT 31 40 210*  ALKPHOS 126 192* 337*  BILITOT 3.9* 5.0* 4.5*  ALBUMIN 2.1* 2.0* 2.1*   Cardiac Enzymes No results for input(s): TROPONINI, PROBNP in the last 168 hours. Glucose  Recent Labs Lab 02/11/15 1206 02/11/15 1604 02/11/15 1920 02/11/15 2352 02/12/15 0349 02/12/15 0808  GLUCAP 112* 163* 118* 192* 145* 169*    ASSESSMENT / PLAN:  PULMONARY OETT 9/26>>> Acute respiratory failure - multifactorial in setting heart failure, cardiogenic shock and severe deconditioning post multiple surgeries  P:   Cont daily attempts at PSV, goal extubation if she can do PS5 today Has had some confusion and delirium, but following commands today CXR w improvement vascular congestion. Diuresis per cardiology, will likely need to back off 10/7 given rising S Cr Nutrition as below  PRN bronchodilators  CARDIOVASCULAR CVL R IJ cordis 9/26>>> LUA PICC >>>  CAD s/p CABG  Acute systolic heart failure  Ischemic cardiomyopathy  Cardiogenic shock  P:  Off lasix drip. Transitioned to 20 mg IV bid. On milrinone per cards   RENAL Hyponatremia  AKI - mild but worsening 10/7 with diuresis Hypokalemia  P:   F/u chem  Replete K PRN  GASTROINTESTINAL Protein calorie malnutrition  Transaminitis, ? Etiology. At risk shock liver. Not currently on statin or other offending meds P:   Follow LFt for plateau TF per nutrition.  PPI  HEMATOLOGIC Thrombocytopenia - improved  Anemia - mild P:  F/u CBC lovenox  Check INR given rising LFT  INFECTIOUS Open sternum > closed  P:   Wound (Sternum) 10/3>>> Sputum 10/4>>>   Vancomycin 10/5>>> Ceftaz 10/6>>   ENDOCRINE Hyperglycemia  P:   SSI  NEUROLOGIC Sedation requirement  P:   RASS goal: 0 Precedex  off Seroquel qhs Will try haldol PRN for delirium. Follow qTC PRN pain rx   FAMILY  - Updates:   D/w Dr Maudie Flakes 10/6  Critical care time 35 mins  Levy Pupa, MD, PhD 02/12/2015, 9:44 AM [facility] [phone] or if no answer [phone]

## 2015-02-13 DIAGNOSIS — J9811 Atelectasis: Secondary | ICD-10-CM | POA: Insufficient documentation

## 2015-02-13 DIAGNOSIS — I509 Heart failure, unspecified: Secondary | ICD-10-CM | POA: Insufficient documentation

## 2015-02-13 DIAGNOSIS — I214 Non-ST elevation (NSTEMI) myocardial infarction: Secondary | ICD-10-CM | POA: Insufficient documentation

## 2015-02-13 LAB — GLUCOSE, CAPILLARY
GLUCOSE-CAPILLARY: 96 mg/dL (ref 65–99)
Glucose-Capillary: 180 mg/dL — ABNORMAL HIGH (ref 65–99)
Glucose-Capillary: 120 mg/dL — ABNORMAL HIGH (ref 65–99)
Glucose-Capillary: 186 mg/dL — ABNORMAL HIGH (ref 65–99)
Glucose-Capillary: 150 mg/dL — ABNORMAL HIGH (ref 65–99)
Glucose-Capillary: 137 mg/dL — ABNORMAL HIGH (ref 65–99)

## 2015-02-13 LAB — COMPREHENSIVE METABOLIC PANEL
ALT: 149 U/L — ABNORMAL HIGH (ref 14–54)
AST: 95 U/L — ABNORMAL HIGH (ref 15–41)
Albumin: 2.2 g/dL — ABNORMAL LOW (ref 3.5–5.0)
Alkaline Phosphatase: 278 U/L — ABNORMAL HIGH (ref 38–126)
Anion gap: 13 (ref 5–15)
BUN: 57 mg/dL — ABNORMAL HIGH (ref 6–20)
CO2: 29 mmol/L (ref 22–32)
Calcium: 8.4 mg/dL — ABNORMAL LOW (ref 8.9–10.3)
Chloride: 96 mmol/L — ABNORMAL LOW (ref 101–111)
Creatinine, Ser: 1.72 mg/dL — ABNORMAL HIGH (ref 0.44–1.00)
GFR calc Af Amer: 36 mL/min — ABNORMAL LOW (ref >60)
GFR calc non Af Amer: 31 mL/min — ABNORMAL LOW (ref >60)
Glucose, Bld: 176 mg/dL — ABNORMAL HIGH (ref 65–99)
Potassium: 3.6 mmol/L (ref 3.5–5.1)
Sodium: 138 mmol/L (ref 135–145)
Total Bilirubin: 3.1 mg/dL — ABNORMAL HIGH (ref 0.3–1.2)
Total Protein: 5.9 g/dL — ABNORMAL LOW (ref 6.5–8.1)

## 2015-02-13 LAB — CBC
HCT: 23.9 % — ABNORMAL LOW (ref 36.0–46.0)
Hemoglobin: 8.1 g/dL — ABNORMAL LOW (ref 12.0–15.0)
MCH: 29.9 pg (ref 26.0–34.0)
MCHC: 33.9 g/dL (ref 30.0–36.0)
MCV: 88.2 fL (ref 78.0–100.0)
Platelets: 383 10*3/uL (ref 150–400)
RBC: 2.71 MIL/uL — ABNORMAL LOW (ref 3.87–5.11)
RDW: 16.5 % — ABNORMAL HIGH (ref 11.5–15.5)
WBC: 10.9 10*3/uL — ABNORMAL HIGH (ref 4.0–10.5)

## 2015-02-13 LAB — CARBOXYHEMOGLOBIN
Carboxyhemoglobin: 1.7 % — ABNORMAL HIGH (ref 0.5–1.5)
Methemoglobin: 1 % (ref 0.0–1.5)
O2 Saturation: 73.2 %
Total hemoglobin: 7.2 g/dL — ABNORMAL LOW (ref 12.0–16.0)

## 2015-02-13 LAB — PROTIME-INR
INR: 1.44 (ref 0.00–1.49)
PROTHROMBIN TIME: 17.6 s — AB (ref 11.6–15.2)

## 2015-02-13 MED ORDER — POTASSIUM CHLORIDE 10 MEQ/50ML IV SOLN
10.0000 meq | INTRAVENOUS | Status: AC
  Administered 2015-02-13 (×3): 10 meq via INTRAVENOUS
  Filled 2015-02-13 (×3): qty 50

## 2015-02-13 MED ORDER — FAMOTIDINE 40 MG/5ML PO SUSR
20.0000 mg | Freq: Two times a day (BID) | ORAL | Status: DC
  Administered 2015-02-13 – 2015-02-17 (×9): 20 mg
  Filled 2015-02-13 (×12): qty 2.5

## 2015-02-13 MED ORDER — SODIUM CHLORIDE 0.9 % IV SOLN
INTRAVENOUS | Status: DC
  Administered 2015-02-13: 10:00:00 via INTRAVENOUS
  Administered 2015-02-15: 10 mL/h via INTRAVENOUS
  Administered 2015-02-18: via INTRAVENOUS

## 2015-02-13 MED ORDER — SODIUM CHLORIDE 0.9 % IV SOLN
0.0000 ug/min | INTRAVENOUS | Status: DC
  Administered 2015-02-13: 5 ug/min via INTRAVENOUS
  Filled 2015-02-13: qty 1

## 2015-02-13 NOTE — Progress Notes (Signed)
PULMONARY / CRITICAL CARE MEDICINE   Name: Breanna Alvarez MRN: [id number] DOB: 1950-08-06    ADMISSION DATE:  01/26/2015 CONSULTATION DATE:  10/5  REFERRING MD :  Donata Clay   CHIEF COMPLAINT:  Vent management   INITIAL PRESENTATION: 63yo female never smoker with hx HTN, CAD previously refused CABG ultimately admitted 9/20 for CVTS eval and CABG after ongoing chest pain.  She underwent 4V CABG 9/26 and has had complicated course with cardiogenic shock s/p centrimag placement and removal, ischemic cardiomyopathy with EF 20%, open sternum now s/p sternal closure 10/3.  She remains intubated since initial CABG 9/26 with poor weaning and PCCM now consulted to assist.   STUDIES:  9/21 2D echo>> EF 20-25%, mod MR, PA press 51 mmHg 9/30 TEE>>> EF 30-35%, hypokinesis of apical myocardium, akinesis of anteroseptal myocardium, mild TR, improved LV function    SIGNIFICANT EVENTS: 9/26 CABG x 4, placement of centrimag L ventricular assistive device  9/30 removal centrimag  10/3 sternal closure  HISTORY OF PRESENT ILLNESS:  63yo female never smoker with hx HTN, CAD previously refused CABG ultimately admitted 9/20 for CVTS eval and CABG after ongoing chest pain.  She underwent 4V CABG 9/26 and has had complicated course with cardiogenic shock s/p centrimag placement and removal, ischemic cardiomyopathy with EF 20%, open sternum now s/p sternal closure 10/3.  She remains intubated since initial CABG 9/26 with poor weaning and PCCM now consulted to assist.   SUBJECTIVE:  Precedex off, last dilaudid was at 02:00 Tolerating high PSV but RSBI increases on PS 5  VITAL SIGNS: Temp:  [98.2 F (36.8 C)-100.8 F (38.2 C)] 99.4 F (37.4 C) (10/08 0813) Pulse Rate:  [100-127] 109 (10/08 1000) Resp:  [3-37] 25 (10/08 1000) BP: (92-144)/(37-115) 99/47 mmHg (10/08 1000) SpO2:  [96 %-100 %] 100 % (10/08 1000) FiO2 (%):  [40 %] 40 % (10/08 0800) Weight:  [51.1 kg (112 lb 10.5 oz)] 51.1 kg (112 lb 10.5 oz)  (10/08 0500) HEMODYNAMICS:   VENTILATOR SETTINGS: Vent Mode:  [-] PRVC FiO2 (%):  [40 %] 40 % Set Rate:  [14 bmp] 14 bmp Vt Set:  [370 mL] 370 mL PEEP:  [5 cmH20] 5 cmH20 Pressure Support:  [5 cmH20] 5 cmH20 Plateau Pressure:  [21 cmH20-26 cmH20] 23 cmH20 INTAKE / OUTPUT:  Intake/Output Summary (Last 24 hours) at 02/13/15 1024 Last data filed at 02/13/15 0920  Gross per 24 hour  Intake 1755.84 ml  Output    930 ml  Net 825.84 ml    PHYSICAL EXAMINATION: General:  Acutely ill appearing female, mild distress. Neuro:  Awake, follows commands HEENT:  Moist mucus membranes, ETT Cardiovascular:  S1, S2, RRR, Lungs:  Clear, no Wheeze or crackles. Abdomen:  Round, soft, non tender, +bs  Musculoskeletal:  Warm and dry, generalized edema   LABS:  CBC  Recent Labs Lab 02/11/15 0520 02/11/15 1607 02/12/15 0304 02/13/15 0430  WBC 11.0*  --  11.5* 10.9*  HGB 8.6* 9.2* 7.8* 8.1*  HCT 25.1* 27.0* 23.3* 23.9*  PLT 300  --  335 383   Coag's  Recent Labs Lab 02/12/15 1010 02/13/15 0430  INR 1.56* 1.44   BMET  Recent Labs Lab 02/11/15 0520 02/11/15 1607 02/12/15 0304 02/13/15 0430  NA 130* 132* 135 138  K 3.5 3.6 3.8 3.6  CL 89* 92* 93* 96*  CO2 30  --  30 29  BUN 41* 42* 47* 57*  CREATININE 0.96 1.10* 1.31* 1.72*  GLUCOSE 159* 192* 157* 176*  Electrolytes  Recent Labs Lab 02/08/15 0354  02/11/15 0520 02/12/15 0304 02/13/15 0430  CALCIUM 7.9*  < > 8.2* 8.4* 8.4*  MG 1.6*  --  1.8 1.8  --   PHOS 4.0  --  3.6 3.8  --   < > = values in this interval not displayed. Sepsis Markers No results for input(s): LATICACIDVEN, PROCALCITON, O2SATVEN in the last 168 hours. ABG  Recent Labs Lab 02/10/15 1014 02/12/15 0528 02/12/15 1555  PHART 7.510* 7.452* 7.430  PCO2ART 42.3 39.4 42.4  PO2ART 122.0* 112* 102*   Liver Enzymes  Recent Labs Lab 02/11/15 0520 02/12/15 0304 02/13/15 0430  AST 48* 227* 95*  ALT 40 210* 149*  ALKPHOS 192* 337* 278*   BILITOT 5.0* 4.5* 3.1*  ALBUMIN 2.0* 2.1* 2.2*   Cardiac Enzymes No results for input(s): TROPONINI, PROBNP in the last 168 hours. Glucose  Recent Labs Lab 02/12/15 1250 02/12/15 1709 02/12/15 1947 02/12/15 2357 02/13/15 0427 02/13/15 0810  GLUCAP 122* 103* 153* 96 180* 120*   ASSESSMENT / PLAN:  PULMONARY OETT 9/26>>>10/7>>>10/7 Acute respiratory failure - multifactorial in setting heart failure, cardiogenic shock and severe deconditioning post multiple surgeries  P:   Cont daily attempts at PSV, but no extubation given yesterday's events, will need trach early next week. Has had some confusion and delirium, but following commands today. CXR w improvement vascular congestion. Diuresis per cardiology, will likely need to back off 10/7 given rising S Cr Nutrition as below  PRN bronchodilators  CARDIOVASCULAR CVL R IJ cordis 9/26>>> LUA PICC >>>  CAD s/p CABG  Acute systolic heart failure  Ischemic cardiomyopathy  Cardiogenic shock  P:  Needs further diureses but given renal function will hold off for today. On milrinone per cards   RENAL Hyponatremia  AKI - mild but worsening 10/7 with diuresis Hypokalemia  P:   F/u chem  Replete electrolytes as needed KVO IVF Needs further diureses but given renal function will hold off for today.  GASTROINTESTINAL Protein calorie malnutrition  Transaminitis, ? Etiology. At risk shock liver. Not currently on statin or other offending meds P:   Follow LFt for plateau TF per nutrition.  PPI  HEMATOLOGIC Thrombocytopenia - improved  Anemia - mild P:  F/u CBC Lovenox  INR stable for now.  INFECTIOUS Open sternum > closed  P:   Wound (Sternum) 10/3>>> Sputum 10/4>>>   Vancomycin 10/5>>> Ceftaz 10/6>>   ENDOCRINE Hyperglycemia  P:   SSI  NEUROLOGIC Sedation requirement  P:   RASS goal: 0 Precedex off Seroquel qhs Will try haldol PRN for delirium. Follow qTC PRN pain rx   FAMILY  - Updates:  No  family bedside.  The patient is critically ill with multiple organ systems failure and requires high complexity decision making for assessment and support, frequent evaluation and titration of therapies, application of advanced monitoring technologies and extensive interpretation of multiple databases.   Critical Care Time devoted to patient care services described in this note is  35  Minutes. This time reflects time of care of this signee Dr Koren Bound. This critical care time does not reflect procedure time, or teaching time or supervisory time of PA/NP/Med student/Med Resident etc but could involve care discussion time.  Alyson Reedy, M.D. [facility]/Critical Care Medicine. Pager: [phone]. After hours pager: [phone].

## 2015-02-13 NOTE — Progress Notes (Signed)
5 Days Post-Op Procedure(s) (LRB): STERNAL CLOSURE (N/A) TRANSESOPHAGEAL ECHOCARDIOGRAM (TEE) (N/A) Subjective:  Intubated and sedated on low dose Precedex but opens eyes and follows some commands  Objective: Vital signs in last 24 hours: Temp:  [98.2 F (36.8 C)-100.8 F (38.2 C)] 99.4 F (37.4 C) (10/08 0813) Pulse Rate:  [100-127] 109 (10/08 1000) Cardiac Rhythm:  [-] Sinus tachycardia (10/08 0800) Resp:  [3-37] 25 (10/08 1000) BP: (92-144)/(37-115) 99/47 mmHg (10/08 1000) SpO2:  [96 %-100 %] 100 % (10/08 1000) FiO2 (%):  [40 %] 40 % (10/08 0800) Weight:  [51.1 kg (112 lb 10.5 oz)] 51.1 kg (112 lb 10.5 oz) (10/08 0500)  Hemodynamic parameters for last 24 hours:    Intake/Output from previous day: 10/07 0701 - 10/08 0700 In: 1862 [I.V.:692; NG/GT:920; IV Piggyback:250] Out: 1130 [Urine:1080; Stool:50] Intake/Output this shift: Total I/O In: 365.6 [I.V.:35.6; NG/GT:180; IV Piggyback:150] Out: 160 [Urine:160]  General appearance: tongue is swollen with multiple small ulcers probably due to local trauma from teeth. Heart: regular rate and rhythm, S1, S2 normal, no murmur, click, rub or gallop Lungs: clear to auscultation bilaterally  Abdomen: soft, non-tender; bowel sounds normal; no masses,  no organomegaly Extremities: edema mild anasarca Wound: incision ok  Lab Results:  Recent Labs  02/12/15 0304 02/13/15 0430  WBC 11.5* 10.9*  HGB 7.8* 8.1*  HCT 23.3* 23.9*  PLT 335 383   BMET:  Recent Labs  02/12/15 0304 02/13/15 0430  NA 135 138  K 3.8 3.6  CL 93* 96*  CO2 30 29  GLUCOSE 157* 176*  BUN 47* 57*  CREATININE 1.31* 1.72*  CALCIUM 8.4* 8.4*    PT/INR:  Recent Labs  02/13/15 0430  LABPROT 17.6*  INR 1.44   ABG    Component Value Date/Time   PHART 7.430 02/12/2015 1555   HCO3 27.7* 02/12/2015 1555   TCO2 29.0 02/12/2015 1555   ACIDBASEDEF 6.0* 02/07/2015 0245   O2SAT 73.2 02/13/2015 0436   CBG (last 3)   Recent Labs  02/12/15 2357  02/13/15 0427 02/13/15 0810  GLUCAP 96 180* 120*    Assessment/Plan: S/P Procedure(s) (LRB): STERNAL CLOSURE (N/A) TRANSESOPHAGEAL ECHOCARDIOGRAM (TEE) (N/A)  She remains hemodynamically stable on Milrinone 0.25 with Co-ox of 73%.  Tolerating tube feeds at goal with loose stool.  Vent dependent respiratory failure. Discussed with Dr. Molli Knock and feel that she is going to need trach early next week due to minimal gag, poor cough, swollen tongue.   LOS: 18 days    Alleen Borne 02/13/2015

## 2015-02-13 NOTE — Progress Notes (Signed)
Advanced Heart Failure Rounding Note   Subjective:    S/p CABG with Centrimag placement 02/01/15  Centrimag explanted 9/30.   Sternal Closure 10/3.    Extubated 10/7 reintubated 2 hours later.   Awake on vent. On milrinone - dose reduced to 0.25 yesterday. Off norepi. Co-ox 73%. Lasix increased yesterday.  Cr 1.1-> 1.3 -> 1.7  Weight stable at 112   Objective:   Weight Range:  Vital Signs:   Temp:  [98.2 F (36.8 C)-100.8 F (38.2 C)] 99.4 F (37.4 C) (10/08 0813) Pulse Rate:  [98-127] 109 (10/08 0800) Resp:  [3-37] 12 (10/08 0800) BP: (92-144)/(37-115) 105/50 mmHg (10/08 0800) SpO2:  [96 %-100 %] 100 % (10/08 0800) FiO2 (%):  [40 %] 40 % (10/08 0400) Weight:  [51.1 kg (112 lb 10.5 oz)] 51.1 kg (112 lb 10.5 oz) (10/08 0500) Last BM Date: 02/12/15  Weight change: Filed Weights   02/11/15 0530 02/12/15 0500 02/13/15 0500  Weight: 51.1 kg (112 lb 10.5 oz) 50.9 kg (112 lb 3.4 oz) 51.1 kg (112 lb 10.5 oz)    Intake/Output:   Intake/Output Summary (Last 24 hours) at 02/13/15 0840 Last data filed at 02/13/15 0800  Gross per 24 hour  Intake 1804.04 ml  Output   1145 ml  Net 659.04 ml     Physical Exam:  General:  Intubated. Awake HEENT: normal x ETT Neck: supple. Cor: Chest with dressing. Tachy regular Lungs: clear Abdomen: soft, nontender, nondistended. Hypoactive bowel sounds. Extremities: no cyanosis, clubbing, rash,  2+ edema into thighs Neuro: awake on vent    Telemetry: Sinus tach 100-110  Labs: Basic Metabolic Panel:  Recent Labs Lab 02/06/15 1638  02/07/15 0309 02/08/15 0354  02/09/15 0409  02/10/15 0416 02/10/15 1642 02/11/15 0520 02/11/15 1607 02/12/15 0304 02/13/15 0430  NA  --   < > 128* 129*  < > 125*  < > 127* 130* 130* 132* 135 138  K  --   < > 3.7 3.3*  < > 3.6  < > 3.3* 3.7 3.5 3.6 3.8 3.6  CL  --   < > 98* 92*  < > 89*  < > 91* 90* 89* 92* 93* 96*  CO2  --   < > 21* 28  < > 27  --  28  --  30  --  30 29  GLUCOSE  --   < >  186* 144*  < > 149*  < > 168* 175* 159* 192* 157* 176*  BUN  --   < > 22* 24*  < > 29*  < > 36* 44* 41* 42* 47* 57*  CREATININE 1.13*  < > 1.08* 0.88  < > 0.92  < > 1.03* 1.00 0.96 1.10* 1.31* 1.72*  CALCIUM  --   < > 7.8* 7.9*  < > 7.8*  --  8.1*  --  8.2*  --  8.4* 8.4*  MG 2.2  --  1.9 1.6*  --   --   --   --   --  1.8  --  1.8  --   PHOS  --   --  4.7* 4.0  --   --   --   --   --  3.6  --  3.8  --   < > = values in this interval not displayed.  Liver Function Tests:  Recent Labs Lab 02/09/15 0409 02/10/15 0416 02/11/15 0520 02/12/15 0304 02/13/15 0430  AST 36 38 48* 227* 95*  ALT 30 31 40 210* 149*  ALKPHOS 121 126 192* 337* 278*  BILITOT 3.7* 3.9* 5.0* 4.5* 3.1*  PROT 5.1* 5.7* 5.4* 5.8* 5.9*  ALBUMIN 2.1* 2.1* 2.0* 2.1* 2.2*   No results for input(s): LIPASE, AMYLASE in the last 168 hours. No results for input(s): AMMONIA in the last 168 hours.  CBC:  Recent Labs Lab 02/07/15 0309 02/08/15 0354  02/09/15 0409  02/10/15 0416 02/10/15 1642 02/11/15 0520 02/11/15 1607 02/12/15 0304 02/13/15 0430  WBC 13.9* 9.8  < > 9.5  --  12.6*  --  11.0*  --  11.5* 10.9*  NEUTROABS 10.0* 6.6  --   --   --   --   --   --   --   --   --   HGB 9.9* 9.2*  < > 8.4*  < > 8.8* 8.8* 8.6* 9.2* 7.8* 8.1*  HCT 28.4* 27.3*  < > 24.7*  < > 25.8* 26.0* 25.1* 27.0* 23.3* 23.9*  MCV 86.6 86.9  < > 87.3  --  86.6  --  88.1  --  88.9 88.2  PLT 105* 123*  < > 150  --  229  --  300  --  335 383  < > = values in this interval not displayed.  Cardiac Enzymes: No results for input(s): CKTOTAL, CKMB, CKMBINDEX, TROPONINI in the last 168 hours.  BNP: BNP (last 3 results)  Recent Labs  07/10/14 1315  BNP 1059.1*    ProBNP (last 3 results) No results for input(s): PROBNP in the last 8760 hours.    Other results:  Imaging: Dg Chest Port 1 View  02/12/2015   CLINICAL DATA:  Status post endotracheal tube placement  EXAM: PORTABLE CHEST - 1 VIEW  COMPARISON:  02/12/2015  FINDINGS: Cardiac  shadow is mildly prominent. A left-sided PICC line is again seen in satisfactory position. Feeding catheter is noted in the stomach. Endotracheal tube is seen with the tip 2.2 cm above the carina. Patchy changes are again identified throughout both lungs. No new focal abnormality is seen.  IMPRESSION: Tubes and lines as described.  Bilateral infiltrates similar to that seen on the prior exam.   Electronically Signed   By: Alcide Clever M.D.   On: 02/12/2015 14:36   Dg Chest Port 1 View  02/12/2015   CLINICAL DATA:  Status post CABG on February 01, 2015  EXAM: PORTABLE CHEST 1 VIEW  COMPARISON:  Portable chest x-ray of February 11, 2015.  FINDINGS: The lungs are adequately inflated. The interstitial markings remain increased. There is subtle airspace opacity in the right mid lung. Minimal left lower lobe subsegmental atelectasis persists. There is a is trace of pleural fluid on the left. The cardiac silhouette remains enlarged. The pulmonary vascularity is more normal today. The endotracheal tube tip lies 4.4 cm above the carina. The esophagogastric tube and feeding tube tips project below the inferior margin of the image. The left-sided PICC line tip projects at the junction of the middle and distal thirds of the SVC. There are 7 intact sternal wires.  IMPRESSION: Slight interval improvement in the appearance of the pulmonary interstitium. Persistent mild enlargement of the cardiac silhouette. Stable right midlung airspace opacity and mild left lower lobe atelectasis.   Electronically Signed   By: David  [place] M.D.   On: 02/12/2015 07:50     Medications:     Scheduled Medications: . acetaminophen (TYLENOL) oral liquid 160 mg/5 mL  650 mg Oral Q6H  . antiseptic  oral rinse  7 mL Mouth Rinse q12n4p  . antiseptic oral rinse  7 mL Mouth Rinse QID  . aspirin EC  325 mg Oral Daily   Or  . aspirin  324 mg Per Tube Daily  . cefTAZidime (FORTAZ)  IV  1 g Intravenous Q12H  . chlorhexidine gluconate  15 mL  Mouth Rinse BID  . clotrimazole  1 Applicatorful Vaginal QHS  . enoxaparin (LOVENOX) injection  40 mg Subcutaneous Q24H  . furosemide  40 mg Intravenous BID  . insulin aspart  0-24 Units Subcutaneous 6 times per day  . metoCLOPramide (REGLAN) injection  10 mg Intravenous 4 times per day  . mupirocin ointment  1 application Topical BID  . pantoprazole sodium  40 mg Per Tube Daily  . potassium chloride  10 mEq Intravenous Q1 Hr x 3  . QUEtiapine  50 mg Oral QHS  . sodium chloride  10-40 mL Intracatheter Q12H  . sodium chloride  3 mL Intravenous Q12H  . vancomycin  500 mg Intravenous Q24H    Infusions: . sodium chloride Stopped (02/13/15 0749)  . dexmedetomidine 0.4 mcg/kg/hr (02/13/15 0730)  . feeding supplement (VITAL AF 1.2 CAL) 1,000 mL (02/13/15 0700)  . milrinone 0.25 mcg/kg/min (02/12/15 2100)  . norepinephrine (LEVOPHED) Adult infusion Stopped (02/12/15 0156)    PRN Medications: sodium chloride, albuterol, haloperidol lactate, HYDROmorphone (DILAUDID) injection, metoprolol, midazolam, ondansetron (ZOFRAN) IV, sodium chloride, sodium chloride   Assessment:   1. Cardiogenic shock   --s/p CABG with centrimag placement 02/01/15. Centrimag removed 9/30 2. CAD s/p CABG 3. 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 Acute respiratory failure 5. Acute blood loss anemia 6. Thrombocytopenia: HIT negative by SRA 7. Hyponatremia 8. Hypokalemia  Plan/Discussion:     Co-ox looks good on milrinone. Will continue at current dose for now.  Still about 10 pounds up from baseline. I increased lasix yesterday but creatinine climbing again. Will hold diuretics today. No CXR this am. Will get CVPs to help guide.     Bensimhon, Daniel,MD 8:40 AM Advanced Heart Failure Team Pager [phone] (M-F; 7a - 4p)  Please contact [facility] for night-coverage after hours (4p -7a ) and weekends on [email or web address]

## 2015-02-13 NOTE — Progress Notes (Signed)
Patient ID: Breanna Alvarez, female   DOB: May 25, 1950, 64 y.o.   MRN: [id number]  SICU Evening Rounds:  Hemodynamically stable  sats 100 on vent  Urine output good.  No new problems today

## 2015-02-14 ENCOUNTER — Inpatient Hospital Stay (HOSPITAL_COMMUNITY): Payer: Medicaid Other

## 2015-02-14 DIAGNOSIS — J9600 Acute respiratory failure, unspecified whether with hypoxia or hypercapnia: Secondary | ICD-10-CM

## 2015-02-14 LAB — GLUCOSE, CAPILLARY
GLUCOSE-CAPILLARY: 194 mg/dL — AB (ref 65–99)
GLUCOSE-CAPILLARY: 125 mg/dL — AB (ref 65–99)
GLUCOSE-CAPILLARY: 138 mg/dL — AB (ref 65–99)
Glucose-Capillary: 117 mg/dL — ABNORMAL HIGH (ref 65–99)
Glucose-Capillary: 122 mg/dL — ABNORMAL HIGH (ref 65–99)
Glucose-Capillary: 146 mg/dL — ABNORMAL HIGH (ref 65–99)
Glucose-Capillary: 170 mg/dL — ABNORMAL HIGH (ref 65–99)

## 2015-02-14 LAB — PREPARE RBC (CROSSMATCH)

## 2015-02-14 LAB — COMPREHENSIVE METABOLIC PANEL
ALT: 96 U/L — ABNORMAL HIGH (ref 14–54)
AST: 48 U/L — ABNORMAL HIGH (ref 15–41)
Albumin: 2.3 g/dL — ABNORMAL LOW (ref 3.5–5.0)
Alkaline Phosphatase: 201 U/L — ABNORMAL HIGH (ref 38–126)
Anion gap: 13 (ref 5–15)
BUN: 65 mg/dL — ABNORMAL HIGH (ref 6–20)
CO2: 28 mmol/L (ref 22–32)
Calcium: 8.3 mg/dL — ABNORMAL LOW (ref 8.9–10.3)
Chloride: 96 mmol/L — ABNORMAL LOW (ref 101–111)
Creatinine, Ser: 1.56 mg/dL — ABNORMAL HIGH (ref 0.44–1.00)
GFR calc Af Amer: 40 mL/min — ABNORMAL LOW (ref >60)
GFR calc non Af Amer: 35 mL/min — ABNORMAL LOW (ref >60)
Glucose, Bld: 221 mg/dL — ABNORMAL HIGH (ref 65–99)
Potassium: 3.5 mmol/L (ref 3.5–5.1)
Sodium: 137 mmol/L (ref 135–145)
Total Bilirubin: 2.4 mg/dL — ABNORMAL HIGH (ref 0.3–1.2)
Total Protein: 6 g/dL — ABNORMAL LOW (ref 6.5–8.1)

## 2015-02-14 LAB — BLOOD GAS, ARTERIAL
ACID-BASE EXCESS: 0.8 mmol/L (ref 0.0–2.0)
Bicarbonate: 25 mEq/L — ABNORMAL HIGH (ref 20.0–24.0)
DRAWN BY: 236041
FIO2: 0.4
LHR: 14 {breaths}/min
MECHVT: 370 mL
O2 SAT: 98.7 %
PEEP/CPAP: 5 cmH2O
PH ART: 7.4 (ref 7.350–7.450)
Patient temperature: 99
TCO2: 26.3 mmol/L (ref 0–100)
pCO2 arterial: 41.4 mmHg (ref 35.0–45.0)
pO2, Arterial: 136 mmHg — ABNORMAL HIGH (ref 80.0–100.0)

## 2015-02-14 LAB — CBC
HCT: 21 % — ABNORMAL LOW (ref 36.0–46.0)
Hemoglobin: 7.1 g/dL — ABNORMAL LOW (ref 12.0–15.0)
MCH: 30.2 pg (ref 26.0–34.0)
MCHC: 33.8 g/dL (ref 30.0–36.0)
MCV: 89.4 fL (ref 78.0–100.0)
Platelets: 432 10*3/uL — ABNORMAL HIGH (ref 150–400)
RBC: 2.35 MIL/uL — ABNORMAL LOW (ref 3.87–5.11)
RDW: 16.8 % — ABNORMAL HIGH (ref 11.5–15.5)
WBC: 11 10*3/uL — ABNORMAL HIGH (ref 4.0–10.5)

## 2015-02-14 LAB — MAGNESIUM: Magnesium: 2 mg/dL (ref 1.7–2.4)

## 2015-02-14 LAB — PHOSPHORUS: PHOSPHORUS: 4.9 mg/dL — AB (ref 2.5–4.6)

## 2015-02-14 LAB — CARBOXYHEMOGLOBIN
Carboxyhemoglobin: 1.9 % — ABNORMAL HIGH (ref 0.5–1.5)
Methemoglobin: 0.9 % (ref 0.0–1.5)
O2 Saturation: 59.9 %
Total hemoglobin: 8.6 g/dL — ABNORMAL LOW (ref 12.0–16.0)

## 2015-02-14 LAB — VANCOMYCIN, TROUGH: Vancomycin Tr: 22 ug/mL — ABNORMAL HIGH (ref 10.0–20.0)

## 2015-02-14 MED ORDER — SODIUM CHLORIDE 0.9 % IV SOLN
0.0000 ug/min | INTRAVENOUS | Status: DC
  Administered 2015-02-14: 30 ug/min via INTRAVENOUS
  Administered 2015-02-14 – 2015-02-15 (×2): 45 ug/min via INTRAVENOUS
  Filled 2015-02-14 (×3): qty 4

## 2015-02-14 MED ORDER — POTASSIUM CHLORIDE 10 MEQ/50ML IV SOLN
10.0000 meq | INTRAVENOUS | Status: AC
  Administered 2015-02-14 (×3): 10 meq via INTRAVENOUS
  Filled 2015-02-14 (×3): qty 50

## 2015-02-14 MED ORDER — SODIUM CHLORIDE 0.9 % IV SOLN: Freq: Once | INTRAVENOUS | Status: AC

## 2015-02-14 NOTE — Progress Notes (Signed)
PULMONARY / CRITICAL CARE MEDICINE   Name: Breanna Alvarez MRN: [id number] DOB: Sep 07, 1950    ADMISSION DATE:  01/26/2015 CONSULTATION DATE:  10/5  REFERRING MD :  Donata Clay   CHIEF COMPLAINT:  Vent management   INITIAL PRESENTATION: 64yo female never smoker with hx HTN, CAD previously refused CABG ultimately admitted 9/20 for CVTS eval and CABG after ongoing chest pain.  She underwent 4V CABG 9/26 and has had complicated course with cardiogenic shock s/p centrimag placement and removal, ischemic cardiomyopathy with EF 20%, open sternum now s/p sternal closure 10/3.  She remains intubated since initial CABG 9/26 with poor weaning and PCCM now consulted to assist.   STUDIES:  9/21 2D echo>> EF 20-25%, mod MR, PA press 51 mmHg 9/30 TEE>>> EF 30-35%, hypokinesis of apical myocardium, akinesis of anteroseptal myocardium, mild TR, improved LV function    SIGNIFICANT EVENTS: 9/26 CABG x 4, placement of centrimag L ventricular assistive device  9/30 removal centrimag  10/3 sternal closure  HISTORY OF PRESENT ILLNESS:  64yo female never smoker with hx HTN, CAD previously refused CABG ultimately admitted 9/20 for CVTS eval and CABG after ongoing chest pain.  She underwent 4V CABG 9/26 and has had complicated course with cardiogenic shock s/p centrimag placement and removal, ischemic cardiomyopathy with EF 20%, open sternum now s/p sternal closure 10/3.  She remains intubated since initial CABG 9/26 with poor weaning and PCCM now consulted to assist.   SUBJECTIVE:  Hg drop overnight by 1 gm and transfusion ordered. Hypotension overnight with increase in precedex.  VITAL SIGNS: Temp:  [98.3 F (36.8 C)-99.5 F (37.5 C)] 99.4 F (37.4 C) (10/09 0700) Pulse Rate:  [80-120] 98 (10/09 0745) Resp:  [0-34] 26 (10/09 0745) BP: (76-134)/(35-74) 92/74 mmHg (10/09 0745) SpO2:  [99 %-100 %] 100 % (10/09 0745) FiO2 (%):  [30 %-40 %] 30 % (10/09 0745) Weight:  [50.1 kg (110 lb 7.2 oz)] 50.1 kg (110  lb 7.2 oz) (10/09 0600)   HEMODYNAMICS: CVP:  [6 mmHg-39 mmHg] 15 mmHg   VENTILATOR SETTINGS: Vent Mode:  [-] PSV;CPAP FiO2 (%):  [30 %-40 %] 30 % Set Rate:  [14 bmp] 14 bmp Vt Set:  [370 mL] 370 mL PEEP:  [5 cmH20] 5 cmH20 Pressure Support:  [8 cmH20] 8 cmH20 Plateau Pressure:  [22 cmH20] 22 cmH20  INTAKE / OUTPUT:  Intake/Output Summary (Last 24 hours) at 02/14/15 0819 Last data filed at 02/14/15 0703  Gross per 24 hour  Intake 2177.8 ml  Output   1285 ml  Net  892.8 ml   PHYSICAL EXAMINATION: General:  Acute on chronic ill appearing female, arousable but very weak. Neuro:  Awake, follows commands but very weak. HEENT:  Moist mucus membranes, ETT. Cardiovascular:  S1, S2, RRR, Lungs:  Clear, no Wheeze or crackles. Abdomen:  Round, soft, non tender, +bs  Musculoskeletal:  Warm and dry, generalized edema   LABS:  CBC  Recent Labs Lab 02/12/15 0304 02/13/15 0430 02/14/15 0409  WBC 11.5* 10.9* 11.0*  HGB 7.8* 8.1* 7.1*  HCT 23.3* 23.9* 21.0*  PLT 335 383 432*   Coag's  Recent Labs Lab 02/12/15 1010 02/13/15 0430  INR 1.56* 1.44   BMET  Recent Labs Lab 02/12/15 0304 02/13/15 0430 02/14/15 0409  NA 135 138 137  K 3.8 3.6 3.5  CL 93* 96* 96*  CO2 BUN 47* 57* 65*  CREATININE 1.31* 1.72* 1.56*  GLUCOSE 157* 176* 221*   Electrolytes  Recent  Labs Lab 02/11/15 0520 02/12/15 0304 02/13/15 0430 02/14/15 0409  CALCIUM 8.2* 8.4* 8.4* 8.3*  MG 1.8 1.8  --  2.0  PHOS 3.6 3.8  --  4.9*   Sepsis Markers No results for input(s): LATICACIDVEN, PROCALCITON, O2SATVEN in the last 168 hours. ABG  Recent Labs Lab 02/12/15 0528 02/12/15 1555 02/14/15 0415  PHART 7.452* 7.430 7.400  PCO2ART 39.4 42.4 41.4  PO2ART 112* 102* 136*   Liver Enzymes  Recent Labs Lab 02/12/15 0304 02/13/15 0430 02/14/15 0409  AST 227* 95* 48*  ALT 210* 149* 96*  ALKPHOS 337* 278* 201*  BILITOT 4.5* 3.1* 2.4*  ALBUMIN 2.1* 2.2* 2.3*   Cardiac  Enzymes No results for input(s): TROPONINI, PROBNP in the last 168 hours. Glucose  Recent Labs Lab 02/13/15 0810 02/13/15 1227 02/13/15 1630 02/13/15 2011 02/13/15 2342 02/14/15 0355  GLUCAP 120* 186* 150* 137* 146* 194*   ASSESSMENT / PLAN:  PULMONARY OETT 9/26>>>10/7>>>10/7 Acute respiratory failure - multifactorial in setting heart failure, cardiogenic shock and severe deconditioning post multiple surgeries  P:   PS as able but no extubation given weakness and inability to protect airway. Has had some confusion and delirium, but following commands today. Hold further diureses given hypotension, may give a dose with transfusion however. PRN bronchodilators CVTS to decide when to trach, recommend early this week.  CARDIOVASCULAR CVL R IJ cordis 9/26>>>10/2 LUA PICC >>>  CAD s/p CABG  Acute systolic heart failure  Ischemic cardiomyopathy  Cardiogenic shock  P:  Needs further diureses but given hemodynamics will hold off for today. On milrinone per cards. Neo for BP support, hope is that with decrease in precedex will require less pressor support.  RENAL Hyponatremia  AKI - mild but worsening 10/7 with diuresis Hypokalemia  P:   F/u chem  Replete electrolytes as needed KVO IVF Needs further diureses but given hemodynamics will hold. D/C lasix.  GASTROINTESTINAL Protein calorie malnutrition  Transaminitis, ? Etiology. At risk shock liver. Not currently on statin or other offending meds P:   LFT almost at normal, will d/c daily checks. TF per nutrition.  PPI  HEMATOLOGIC Thrombocytopenia - improved  Anemia - mild P:  F/u CBC Lovenox  INR stable for now at 1.44..  INFECTIOUS Open sternum > closed  P:   Wound (Sternum) 10/3>>>NTD Sputum 10/4>>> NTD  Vancomycin 10/5>>> Ceftaz 10/6>>   ENDOCRINE Hyperglycemia  P:   SSI  NEUROLOGIC Sedation requirement  P:   RASS goal: 0 Precedex at 1.0, will titrate down to hopefully improve  hemodynamics. Seroquel qhs. PRN pain rx   FAMILY  - Updates:  No family bedside.  The patient is critically ill with multiple organ systems failure and requires high complexity decision making for assessment and support, frequent evaluation and titration of therapies, application of advanced monitoring technologies and extensive interpretation of multiple databases.   Critical Care Time devoted to patient care services described in this note is  35  Minutes. This time reflects time of care of this signee Dr Koren Bound. This critical care time does not reflect procedure time, or teaching time or supervisory time of PA/NP/Med student/Med Resident etc but could involve care discussion time.  Alyson Reedy, M.D. [facility] Pulmonary/Critical Care Medicine. Pager: [phone]. After hours pager: [phone].

## 2015-02-14 NOTE — Progress Notes (Signed)
6 Days Post-Op Procedure(s) (LRB): STERNAL CLOSURE (N/A) TRANSESOPHAGEAL ECHOCARDIOGRAM (TEE) (N/A)   Subjective: Intubated and sedated  Objective: Vital signs in last 24 hours: Temp:  [98.3 F (36.8 C)-99.6 F (37.6 C)] 98.8 F (37.1 C) (10/09 1115) Pulse Rate:  [75-133] 79 (10/09 1156) Cardiac Rhythm:  [-] Normal sinus rhythm (10/09 0800) Resp:  [0-35] 18 (10/09 1156) BP: (76-135)/(35-85) 123/54 mmHg (10/09 1156) SpO2:  [99 %-100 %] 100 % (10/09 1156) FiO2 (%):  [30 %-40 %] 30 % (10/09 1156) Weight:  [50.1 kg (110 lb 7.2 oz)] 50.1 kg (110 lb 7.2 oz) (10/09 0600)  Hemodynamic parameters for last 24 hours: CVP:  [6 mmHg-39 mmHg] 15 mmHg  Intake/Output from previous day: 10/08 0701 - 10/09 0700 In: 2244 [I.V.:584; NG/GT:1260; IV Piggyback:400] Out: 1345 [Urine:845; Stool:500] Intake/Output this shift: Total I/O In: 946.5 [I.V.:646.5; Blood:30; NG/GT:120; IV Piggyback:150] Out: 345 [Urine:245; Stool:100]  Heart: regular rate and rhythm, S1, S2 normal, no murmur, click, rub or gallop Lungs: clear to auscultation bilaterally Abdomen: soft, non-tender; bowel sounds normal; no masses,  no organomegaly Extremities: edema moderate Wound: incision ok  Lab Results:  Recent Labs  02/13/15 0430 02/14/15 0409  WBC 10.9* 11.0*  HGB 8.1* 7.1*  HCT 23.9* 21.0*  PLT 383 432*   BMET:  Recent Labs  02/13/15 0430 02/14/15 0409  NA 138 137  K 3.6 3.5  CL 96* 96*  CO2 29 28  GLUCOSE 176* 221*  BUN 57* 65*  CREATININE 1.72* 1.56*  CALCIUM 8.4* 8.3*    PT/INR:  Recent Labs  02/13/15 0430  LABPROT 17.6*  INR 1.44   ABG    Component Value Date/Time   PHART 7.400 02/14/2015 0415   HCO3 25.0* 02/14/2015 0415   TCO2 26.3 02/14/2015 0415   ACIDBASEDEF 6.0* 02/07/2015 0245   O2SAT 98.7 02/14/2015 0415   CBG (last 3)   Recent Labs  02/13/15 2342 02/14/15 0355 02/14/15 0756  GLUCAP 146* 194* 122*    CLINICAL DATA: Endotracheal tube  placement.  EXAM: PORTABLE CHEST 1 VIEW  COMPARISON: February 12, 2015.  FINDINGS: Status post coronary artery bypass graft. Stable cardiomegaly with central pulmonary vascular congestion and probable perihilar and basilar edema. Endotracheal and nasogastric tubes are unchanged in position. Left-sided PICC line is noted with distal tip now seen in the right atrium. No pneumothorax is noted. No significant pleural effusion is noted. Bony thorax is unremarkable.  IMPRESSION: Stable cardiomegaly with central pulmonary vascular congestion and probable bilateral perihilar edema. Left-sided PICC line is now noted with tip in right atrium. Withdrawal by 2-3 cm is recommended. These results will be called to the ordering clinician or representative by the Radiologist Assistant, and communication documented in the PACS or zVision Dashboard.   Electronically Signed  By: Lupita Raider, M.D.  On: 02/14/2015 07:35    Assessment/Plan: S/P Procedure(s) (LRB): STERNAL CLOSURE (N/A) TRANSESOPHAGEAL ECHOCARDIOGRAM (TEE) (N/A)  She developed hypotension overnight requiring neo. Her Hgb was 7.1 this am, down from 8.1 yesterday. No visible blood loss but positive 1L yesterday so may be dilutional. Transfusing 2 units PRBC's this am. Continue Milrinone and wean neo as tolerated.  She is vent-dependent due to poor gag and generalized weakness. She will need trach to get off vent. I will discuss with PVT and see if he wants to do that or have CCM do it at the bedside. Precedex weaned to see if it will help hypotension  Continue tube feeds at goal.   LOS: 19 days  Breanna Alvarez 02/14/2015

## 2015-02-15 ENCOUNTER — Inpatient Hospital Stay (HOSPITAL_COMMUNITY): Payer: Medicaid Other

## 2015-02-15 ENCOUNTER — Encounter (HOSPITAL_COMMUNITY): Payer: Medicaid Other

## 2015-02-15 DIAGNOSIS — Z789 Other specified health status: Secondary | ICD-10-CM

## 2015-02-15 DIAGNOSIS — Z9911 Dependence on respirator [ventilator] status: Secondary | ICD-10-CM

## 2015-02-15 DIAGNOSIS — Z9689 Presence of other specified functional implants: Secondary | ICD-10-CM | POA: Insufficient documentation

## 2015-02-15 DIAGNOSIS — J9620 Acute and chronic respiratory failure, unspecified whether with hypoxia or hypercapnia: Secondary | ICD-10-CM

## 2015-02-15 LAB — DIFFERENTIAL
Basophils Absolute: 0.1 10*3/uL (ref 0.0–0.1)
Basophils Relative: 1 %
Eosinophils Absolute: 1 10*3/uL — ABNORMAL HIGH (ref 0.0–0.7)
Eosinophils Relative: 7 %
Lymphocytes Relative: 14 %
Lymphs Abs: 1.8 10*3/uL (ref 0.7–4.0)
Monocytes Absolute: 1.2 10*3/uL — ABNORMAL HIGH (ref 0.1–1.0)
Monocytes Relative: 9 %
Neutro Abs: 9.1 10*3/uL — ABNORMAL HIGH (ref 1.7–7.7)
Neutrophils Relative %: 70 %

## 2015-02-15 LAB — GLUCOSE, CAPILLARY
GLUCOSE-CAPILLARY: 146 mg/dL — AB (ref 65–99)
Glucose-Capillary: 84 mg/dL (ref 65–99)
Glucose-Capillary: 150 mg/dL — ABNORMAL HIGH (ref 65–99)
Glucose-Capillary: 126 mg/dL — ABNORMAL HIGH (ref 65–99)
Glucose-Capillary: 103 mg/dL — ABNORMAL HIGH (ref 65–99)

## 2015-02-15 LAB — TYPE AND SCREEN
ABO/RH(D): O POS
ANTIBODY SCREEN: NEGATIVE
UNIT DIVISION: 0
Unit division: 0

## 2015-02-15 LAB — COMPREHENSIVE METABOLIC PANEL
ALT: 82 U/L — ABNORMAL HIGH (ref 14–54)
AST: 37 U/L (ref 15–41)
Albumin: 2.2 g/dL — ABNORMAL LOW (ref 3.5–5.0)
Alkaline Phosphatase: 210 U/L — ABNORMAL HIGH (ref 38–126)
Anion gap: 12 (ref 5–15)
BUN: 65 mg/dL — ABNORMAL HIGH (ref 6–20)
CO2: 23 mmol/L (ref 22–32)
Calcium: 8.5 mg/dL — ABNORMAL LOW (ref 8.9–10.3)
Chloride: 104 mmol/L (ref 101–111)
Creatinine, Ser: 1.29 mg/dL — ABNORMAL HIGH (ref 0.44–1.00)
GFR calc Af Amer: 50 mL/min — ABNORMAL LOW (ref >60)
GFR calc non Af Amer: 43 mL/min — ABNORMAL LOW (ref >60)
Glucose, Bld: 163 mg/dL — ABNORMAL HIGH (ref 65–99)
Potassium: 4 mmol/L (ref 3.5–5.1)
Sodium: 139 mmol/L (ref 135–145)
Total Bilirubin: 2.2 mg/dL — ABNORMAL HIGH (ref 0.3–1.2)
Total Protein: 5.8 g/dL — ABNORMAL LOW (ref 6.5–8.1)

## 2015-02-15 LAB — BLOOD GAS, ARTERIAL
ACID-BASE DEFICIT: 1.4 mmol/L (ref 0.0–2.0)
Bicarbonate: 22.6 mEq/L (ref 20.0–24.0)
DRAWN BY: 236041
FIO2: 0.3
LHR: 14 {breaths}/min
MECHVT: 370 mL
O2 SAT: 96.7 %
PEEP/CPAP: 5 cmH2O
PH ART: 7.411 (ref 7.350–7.450)
Patient temperature: 99
TCO2: 23.7 mmol/L (ref 0–100)
pCO2 arterial: 36.4 mmHg (ref 35.0–45.0)
pO2, Arterial: 88.2 mmHg (ref 80.0–100.0)

## 2015-02-15 LAB — CARBOXYHEMOGLOBIN
Carboxyhemoglobin: 2.3 % — ABNORMAL HIGH (ref 0.5–1.5)
Methemoglobin: 0.8 % (ref 0.0–1.5)
O2 Saturation: 78.7 %
Total hemoglobin: 10.4 g/dL — ABNORMAL LOW (ref 12.0–16.0)

## 2015-02-15 LAB — PHOSPHORUS: PHOSPHORUS: 4.6 mg/dL (ref 2.5–4.6)

## 2015-02-15 LAB — MAGNESIUM: Magnesium: 2.1 mg/dL (ref 1.7–2.4)

## 2015-02-15 MED ORDER — FENTANYL CITRATE (PF) 100 MCG/2ML IJ SOLN: 200.0000 ug | Freq: Once | INTRAMUSCULAR | Status: DC

## 2015-02-15 MED ORDER — VECURONIUM BROMIDE 10 MG IV SOLR: 10.0000 mg | Freq: Once | INTRAVENOUS | Status: DC

## 2015-02-15 MED ORDER — VECURONIUM BROMIDE 10 MG IV SOLR
10.0000 mg | Freq: Once | INTRAVENOUS | Status: AC
  Administered 2015-02-15: 5 mg via INTRAVENOUS

## 2015-02-15 MED ORDER — MIDAZOLAM HCL 2 MG/2ML IJ SOLN: 4.0000 mg | Freq: Once | INTRAMUSCULAR | Status: AC

## 2015-02-15 MED ORDER — VANCOMYCIN HCL 500 MG IV SOLR
500.0000 mg | INTRAVENOUS | Status: DC
  Administered 2015-02-16 – 2015-02-20 (×4): 500 mg via INTRAVENOUS
  Filled 2015-02-15 (×5): qty 500

## 2015-02-15 MED ORDER — FENTANYL CITRATE (PF) 100 MCG/2ML IJ SOLN
25.0000 ug | INTRAMUSCULAR | Status: AC | PRN
  Administered 2015-02-15 – 2015-02-16 (×2): 50 ug via INTRAVENOUS
  Administered 2015-02-16: 25 ug via INTRAVENOUS
  Administered 2015-02-16: 50 ug via INTRAVENOUS
  Filled 2015-02-15 (×4): qty 2

## 2015-02-15 MED ORDER — MIDAZOLAM HCL 2 MG/2ML IJ SOLN
4.0000 mg | Freq: Once | INTRAMUSCULAR | Status: AC
  Administered 2015-02-15: 4 mg via INTRAVENOUS
  Filled 2015-02-15: qty 4

## 2015-02-15 MED ORDER — ETOMIDATE 2 MG/ML IV SOLN
40.0000 mg | Freq: Once | INTRAVENOUS | Status: DC
  Filled 2015-02-15: qty 20

## 2015-02-15 MED ORDER — FENTANYL CITRATE (PF) 100 MCG/2ML IJ SOLN
200.0000 ug | Freq: Once | INTRAMUSCULAR | Status: AC
  Administered 2015-02-15: 200 ug via INTRAVENOUS
  Filled 2015-02-15: qty 4

## 2015-02-15 MED ORDER — PROPOFOL 500 MG/50ML IV EMUL
5.0000 ug/kg/min | Freq: Once | INTRAVENOUS | Status: AC
  Administered 2015-02-15: 15 ug/kg/min via INTRAVENOUS

## 2015-02-15 MED ORDER — ETOMIDATE 2 MG/ML IV SOLN: 40.0000 mg | Freq: Once | INTRAVENOUS | Status: DC

## 2015-02-15 NOTE — Progress Notes (Signed)
PULMONARY / CRITICAL CARE MEDICINE   Name: Breanna Alvarez MRN: [id number] DOB: 03-31-1951    ADMISSION DATE:  01/26/2015 CONSULTATION DATE:  10/5  REFERRING MD :  Breanna Alvarez   CHIEF COMPLAINT:  Vent management   INITIAL PRESENTATION: 63yo female never smoker with hx HTN, CAD previously refused CABG ultimately admitted 9/20 for CVTS eval and CABG after ongoing chest pain.  She underwent 4V CABG 9/26 and has had complicated course with cardiogenic shock s/p centrimag placement and removal, ischemic cardiomyopathy with EF 20%, open sternum now s/p sternal closure 10/3.  She remains intubated since initial CABG 9/26 with poor weaning and PCCM now consulted to assist.   STUDIES:  9/21 2D echo>> EF 20-25%, mod MR, PA press 51 mmHg 9/30 TEE>>> EF 30-35%, hypokinesis of apical myocardium, akinesis of anteroseptal myocardium, mild TR, improved LV function    SIGNIFICANT EVENTS: 9/26 CABG x 4, placement of centrimag L ventricular assistive device  9/30 removal centrimag  10/3 sternal closure 02/14/15:   Hg drop overnight by 1 gm and transfusion ordered. Hypotension overnight with increase in precedex.   SUBJECTIVE/OVERNIGHT/INTERVAL HX 02/15/15: Breanna Alvarez to do trach later today. Currently remains intubated and sedated on 30% fio2, PRVC. On milrinone and neo. Creat trending down. Failure to wean off vent  VITAL SIGNS: Temp:  [96.9 F (36.1 C)-99.1 F (37.3 C)] 98.6 F (37 C) (10/10 0830) Pulse Rate:  [67-94] 76 (10/10 0843) Resp:  [0-32] 26 (10/10 0843) BP: (50-142)/(11-107) 125/60 mmHg (10/10 0843) SpO2:  [97 %-100 %] 100 % (10/10 0843) FiO2 (%):  [30 %-40 %] 30 % (10/10 0843) Weight:  [53 kg (116 lb 13.5 oz)] 53 kg (116 lb 13.5 oz) (10/10 0600)   HEMODYNAMICS: CVP:  [10 mmHg-17 mmHg] 10 mmHg   VENTILATOR SETTINGS: Vent Mode:  [-] PRVC FiO2 (%):  [30 %-40 %] 30 % Set Rate:  [14 bmp] 14 bmp Vt Set:  [370 mL] 370 mL PEEP:  [5 cmH20] 5 cmH20 Plateau Pressure:  [22 cmH20-26  cmH20] 26 cmH20  INTAKE / OUTPUT:  Intake/Output Summary (Last 24 hours) at 02/15/15 0918 Last data filed at 02/15/15 0800  Gross per 24 hour  Intake 3046.04 ml  Output   1605 ml  Net 1441.04 ml   PHYSICAL EXAMINATION: General:  Acute on chronic ill appearing female,  Neuro: RASS- 3 on sedatino gtt. HEENT:  Moist mucus membranes, ETT. Cardiovascular:  S1, S2, RRR, Lungs:  Clear, no Wheeze or crackles. Abdomen:  Round, soft, non tender, +bs  Musculoskeletal:  Warm and dry, generalized edema   LABS: PULMONARY  Recent Labs Lab 02/10/15 1014  02/11/15 1607 02/12/15 0528  02/12/15 1555 02/13/15 0436 02/14/15 0400 02/14/15 0415 02/15/15 0400 02/15/15 0430  PHART 7.510*  --   --  7.452*  --  7.430  --   --  7.400  --  7.411  PCO2ART 42.3  --   --  39.4  --  42.4  --   --  41.4  --  36.4  PO2ART 122.0*  --   --  112*  --  102*  --   --  136*  --  88.2  HCO3 33.8*  --   --  27.1*  --  27.7*  --   --  25.0*  --  22.6  TCO2 35  < > 28 28.3  --  29.0  --   --  26.3  --  23.7  O2SAT 99.0  < >  --  98.6  < >  97.9 73.2 59.9 98.7 78.7 96.7  < > = values in this interval not displayed.  CBC  Recent Labs Lab 02/13/15 0430 02/14/15 0409 02/15/15 0416  HGB 8.1* 7.1* 10.6*  HCT 23.9* 21.0* 31.6*  WBC 10.9* 11.0* 13.1*  PLT 383 432* 523*    COAGULATION  Recent Labs Lab 02/12/15 1010 02/13/15 0430  INR 1.56* 1.44    CARDIAC  No results for input(s): TROPONINI in the last 168 hours. No results for input(s): PROBNP in the last 168 hours.   CHEMISTRY  Recent Labs Lab 02/11/15 0520 02/11/15 1607 02/12/15 0304 02/13/15 0430 02/14/15 0409 02/15/15 0416  NA 130* 132* 135 138 137 139  K 3.5 3.6 3.8 3.6 3.5 4.0  CL 89* 92* 93* 96* 96* 104  CO2 30  --  GLUCOSE 159* 192* 157* 176* 221* 163*  BUN 41* 42* 47* 57* 65* 65*  CREATININE 0.96 1.10* 1.31* 1.72* 1.56* 1.29*  CALCIUM 8.2*  --  8.4* 8.4* 8.3* 8.5*  MG 1.8  --  1.8  --  2.0 2.1  PHOS 3.6  --   3.8  --  4.9* 4.6   Estimated Creatinine Clearance: 32.5 mL/min (by C-G formula based on Cr of 1.29).   LIVER  Recent Labs Lab 02/11/15 0520 02/12/15 0304 02/12/15 1010 02/13/15 0430 02/14/15 0409 02/15/15 0416  AST 48* 227*  --  95* 48* 37  ALT 40 210*  --  149* 96* 82*  ALKPHOS 192* 337*  --  278* 201* 210*  BILITOT 5.0* 4.5*  --  3.1* 2.4* 2.2*  PROT 5.4* 5.8*  --  5.9* 6.0* 5.8*  ALBUMIN 2.0* 2.1*  --  2.2* 2.3* 2.2*  INR  --   --  1.56* 1.44  --   --      INFECTIOUS No results for input(s): LATICACIDVEN, PROCALCITON in the last 168 hours.   ENDOCRINE CBG (last 3)   Recent Labs  02/14/15 2326 02/15/15 0328 02/15/15 0815  GLUCAP 170* 146* 84         IMAGING x48h  - image(s) personally visualized  -   highlighted in bold Dg Chest Port 1 View  02/15/2015   CLINICAL DATA:  Intubation.  EXAM: PORTABLE CHEST 1 VIEW  COMPARISON:  None.  FINDINGS: Endotracheal tube and feeding tube in stable position. Left PICC line stable position. Prior CABG. Cardiomegaly with interim resolution of pulmonary venous congestion. Mild residual basilar interstitial prominence and small left pleural effusion. No pneumothorax.  IMPRESSION: 1. Lines and tubes in stable position. 2. Cardiomegaly. Interim clearing of pulmonary venous congestion. Mild residual basilar interstitial prominence and small left pleural effusion. Findings consistent with partial clearing of congestive heart failure.   Electronically Signed   By: Maisie Fus  Register   On: 02/15/2015 07:18   Dg Chest Port 1 View  02/14/2015   CLINICAL DATA:  Endotracheal tube placement.  EXAM: PORTABLE CHEST 1 VIEW  COMPARISON:  February 12, 2015.  FINDINGS: Status post coronary artery bypass graft. Stable cardiomegaly with central pulmonary vascular congestion and probable perihilar and basilar edema. Endotracheal and nasogastric tubes are unchanged in position. Left-sided PICC line is noted with distal tip now seen in the right atrium. No  pneumothorax is noted. No significant pleural effusion is noted. Bony thorax is unremarkable.  IMPRESSION: Stable cardiomegaly with central pulmonary vascular congestion and probable bilateral perihilar edema. Left-sided PICC line is now noted with tip in right atrium. Withdrawal by 2-3 cm is recommended.  These results will be called to the ordering clinician or representative by the Radiologist Assistant, and communication documented in the PACS or zVision Dashboard.   Electronically Signed   By: Lupita Raider, M.D.   On: 02/14/2015 07:35        ASSESSMENT / PLAN:  PULMONARY OETT 9/26>>>10/7>>>10/7 Acute respiratory failure - multifactorial in setting heart failure, cardiogenic shock and severe deconditioning post multiple surgeries    - failure to wean, needs trach - scheduled for 02/15/2015   P:   Trach 02/15/2015 bu Tyson Alias   CARDIOVASCULAR CVL R IJ cordis 9/26>>>10/2 LUA PICC >>>  CAD s/p CABG  Acute systolic heart failure  Ischemic cardiomyopathy  Cardiogenic shock   - on milrinone, neo, precedex sheduled + lopressor prn per MAR  P:  Needs further diureses but given hemodynamics will hold off for today. On milrinone per cards. Neo for BP support, hope is that with decrease in precedex will require less pressor support.  RENAL  Recent Labs Lab 02/11/15 1607 02/12/15 0304 02/13/15 0430 02/14/15 0409 02/15/15 0416  CREATININE 1.10* 1.31* 1.72* 1.56* 1.29*      Hyponatremia  AKI - mild but worsening 10/7 with diuresis Hypokalemia   - creat slightly better  P:   F/u chem  Replete electrolytes as needed KVO IVF Needs further diureses but given hemodynamics will hold. Hold off lasix.  GASTROINTESTINAL Protein calorie malnutrition  Transaminitis, ? Etiology. At risk shock liver. Not currently on statin or other offending meds  - ongoing tube feeds  P:   LFT almost at normal, will d/c daily checks. TF per nutrition.   PPI  HEMATOLOGIC Thrombocytopenia - improved  Anemia - mild P:  F/u CBC Lovenox  INR stable for now at 1.44..  INFECTIOUS Open sternum > closed  P:   Wound (Sternum) 10/3>>>NTD Sputum 10/4>>> NTD  Vancomycin 10/5>>> Ceftaz 10/6>>   ENDOCRINE Hyperglycemia  P:   SSI  NEUROLOGIC Sedation requirement  P:   RASS goal: 0 Precedex at 1.0, will titrate down to hopefully improve hemodynamics. Seroquel qhs. Haldol prn Dilaudid prn PRN pain rx   FAMILY  - Updates:  Dr Tyson Alias consented family for trach  The patient is critically ill with multiple organ systems failure and requires high complexity decision making for assessment and support, frequent evaluation and titration of therapies, application of advanced monitoring technologies and extensive interpretation of multiple databases.   Critical Care Time devoted to patient care services described in this note is  35  Minutes. This time reflects time of care of this signee Dr Koren Bound. This critical care time does not reflect procedure time, or teaching time or supervisory time of PA/NP/Med student/Med Resident etc but could involve care discussion time.    Dr. Kalman Shan, M.D., [facility].C.P Pulmonary and Critical Care Medicine Staff Physician [facility] Pager: [phone], If no answer or between  15:00h - 7:00h: call [phone]  02/15/2015 9:32 AM

## 2015-02-15 NOTE — Procedures (Signed)
Name:  Breanna Alvarez MRN:  [id number] DOB:  May 31, 1950  OPERATIVE NOTE  Procedure:  Percutaneous tracheostomy.  Indications:  Ventilator-dependent respiratory failure.  Consent:  Procedure, alternatives, risks and benefits discussed with medical POA.  Questions answered.  Consent obtained.  Anesthesia:  Versed, fent, prop drip, vec  Procedure summary:  Appropriate equipment was assembled.  The patient was identified as [name] and safety timeout was performed. The patient was placed in supine position with a towel roll behind shoulder blades and neck extended.  Sterile technique was used. The patient's neck and upper chest were prepped using chlorhexidine / alcohol scrub and the field was draped in usual sterile fashion with full body drape. After the adequate sedation / anesthesia was achieved, attention was directed at the midline trachea, where the cricothyroid membrane was palpated. Approximately two fingerbreadths above the sternal notch, a verticle incision was created with a scalpel after local infiltration with 0.2% Lidocaine. A continued small artery rt inferior wound , silk 3 o suture aplied, hemostasis. Then, using Seldinger technique and a percutaneous tracheostomy set, the trachea was entered with a 14 gauge needle with an overlying sheath. This was all confirmed under direct visualization of a fiberoptic flexible bronchoscope. Entrance into the trachea was identified through the third tracheal ring interspace. Following this, a guidewire was inserted. The needle was removed, leaving the sheath and the guidewire intact. Next, the sheath was removed and a small dilator was inserted. The tracheal rings were then dilated. A #6 Shiley was then opened. The balloon was checked. It was placed over a tracheal dilator, which was then advanced over the guidewire and through the previously dilated tract. The Shiley tracheostomy tube was noted to pass in the trachea with little resistance.  The guidewire and dilator tubes were removed from the trachea. An inner cannula was placed through the tracheostomy tube. The tracheostomy was then secured at the anterior neck with 4 monofilament sutures. The oral endotracheal tube was removed and the ventilator was attached to the newly placed tracheostomy tube. Adequate tidal volumes were noted. The cuff was inflated and no evidence of air leak was noted. No evidence of bleeding was noted. At this point, the procedure was concluded. Post-procedure chest x-ray was ordered.  Complications:  No immediate complications were noted.  Hemodynamic parameters and oxygenation remained stable throughout the procedure.  Estimated blood loss:  Less then 1 mL.  Nelda Bucks., MD Pulmonary and Critical Care Medicine [facility] Pager: [phone]  02/15/2015, 1:02 PM  Can follow up trach clinic [phone]

## 2015-02-15 NOTE — Progress Notes (Addendum)
      [address]       [place] [address]             [phone]      7 Days Post-Op Procedure(s) (LRB): STERNAL CLOSURE (N/A) TRANSESOPHAGEAL ECHOCARDIOGRAM (TEE) (N/A)   Subjective:  Breanna Alvarez remains intubated and sedated.  Objective: Vital signs in last 24 hours: Temp:  [96.9 F (36.1 C)-99.5 F (37.5 C)] 98.1 F (36.7 C) (10/10 0730) Pulse Rate:  [67-133] 90 (10/10 0730) Cardiac Rhythm:  [-] Atrial paced (10/10 0330) Resp:  [0-35] 23 (10/10 0730) BP: (50-142)/(11-107) 107/86 mmHg (10/10 0700) SpO2:  [97 %-100 %] 99 % (10/10 0730) FiO2 (%):  [30 %-40 %] 30 % (10/10 0400) Weight:  [116 lb 13.5 oz (53 kg)] 116 lb 13.5 oz (53 kg) (10/10 0600)  Hemodynamic parameters for last 24 hours: CVP:  [12 mmHg-17 mmHg] 14 mmHg  Intake/Output from previous day: 10/09 0701 - 10/10 0700 In: 3218.2 [I.V.:984.2; Blood:729; NG/GT:1155; IV Piggyback:350] Out: 1600 [Urine:1325; Stool:275]  General appearance: alert, cooperative and no distress Heart: regular rate and rhythm Lungs: diminished breath sounds bibasilar Abdomen: soft, non-tender; bowel sounds normal; no masses,  no organomegaly Extremities: edema trace Wound: clean and dry  Lab Results:  Recent Labs  02/14/15 0409 02/15/15 0416  WBC 11.0* 13.1*  HGB 7.1* 10.6*  HCT 21.0* 31.6*  PLT 432* 523*   BMET:  Recent Labs  02/14/15 0409 02/15/15 0416  NA 137 139  K 3.5 4.0  CL 96* 104  CO2 28 23  GLUCOSE 221* 163*  BUN 65* 65*  CREATININE 1.56* 1.29*  CALCIUM 8.3* 8.5*    PT/INR:  Recent Labs  02/13/15 0430  LABPROT 17.6*  INR 1.44   ABG    Component Value Date/Time   PHART 7.411 02/15/2015 0430   HCO3 22.6 02/15/2015 0430   TCO2 23.7 02/15/2015 0430   ACIDBASEDEF 1.4 02/15/2015 0430   O2SAT 96.7 02/15/2015 0430   CBG (last 3)   Recent Labs  02/14/15 2002 02/14/15 2326 02/15/15 0328  GLUCAP 117* 170* 146*    Assessment/Plan: S/P Procedure(s) (LRB): STERNAL CLOSURE  (N/A) TRANSESOPHAGEAL ECHOCARDIOGRAM (TEE) (N/A)  1. CV- remains on Milrinone, wean Neo as tolerated 2. Pulm- remains intubated, unable to wean, possible Trach, CXR with improvement Pulmonary edema 3. Renal- creatinine trending down, remains hypervolemic, not currently on diuretic  4. Expected post operative blood loss anemia- received 2 units yesterday, Hgb up to 10.1 5. CBGs controlled, continue SSIP 6. Dispo- patient continues to have issues weaning vent, currently sedated due to increased coughing overnight, continue Milrinone, wean Neo as tolerated, continue current care   LOS: 20 days    [name], [name] 02/15/2015   Bedside trach today Remains on vent and neo and milrinone  I have seen and examined Breanna Alvarez and agree with the above assessment  and plan.  [name] MD Beeper [phone] Office [phone] 02/15/2015 7:27 PM

## 2015-02-15 NOTE — Progress Notes (Signed)
Patient had a bedside tracheostomy with a #6 cuffed shiley done by Dr. Tyson Alias. Patients O2 sat remained stable throughout the procedure. Patient remains on 100% O2 until pain medication wears off. RT will continue to monitor.

## 2015-02-15 NOTE — Progress Notes (Signed)
ANTIBIOTIC CONSULT NOTE - FOLLOW UP  Pharmacy Consult for vancomycin  Indication: HCAP  Allergies  Allergen Reactions  . Atorvastatin Other (See Comments)    Gas, chest tightness  . Crestor [Rosuvastatin Calcium] Other (See Comments)    Muscle aches    Patient Measurements: Height: 5' (152.4 cm) Weight: 116 lb 13.5 oz (53 kg) IBW/kg (Calculated) : 45.5   Vital Signs: Temp: 98.5 F (36.9 C) (10/10 1030) BP: 134/59 mmHg (10/10 1111) Pulse Rate: 83 (10/10 1111) Intake/Output from previous day: 10/09 0701 - 10/10 0700 In: 3218.2 [I.V.:984.2; Blood:729; NG/GT:1155; IV Piggyback:350] Out: 1600 [Urine:1325; Stool:275] Intake/Output from this shift: Total I/O In: 335 [I.V.:220; NG/GT:65; IV Piggyback:50] Out: 385 [Urine:385]  Labs:  Recent Labs  02/13/15 0430 02/14/15 0409 02/15/15 0416  WBC 10.9* 11.0* 13.1*  HGB 8.1* 7.1* 10.6*  PLT 383 432* 523*  CREATININE 1.72* 1.56* 1.29*   Estimated Creatinine Clearance: 32.5 mL/min (by C-G formula based on Cr of 1.29).  Recent Labs  02/14/15 2039  VANCOTROUGH 22*     Microbiology: Recent Results (from the past 720 hour(s))  Surgical pcr screen     Status: None   Collection Time: 01/30/15  2:23 PM  Result Value Ref Range Status   MRSA, PCR NEGATIVE NEGATIVE Final   Staphylococcus aureus NEGATIVE NEGATIVE Final    Comment:        The Xpert SA Assay (FDA approved for NASAL specimens in patients over 42 years of age), is one component of a comprehensive surveillance program.  Test performance has been validated by [facility] for patients greater than or equal to 32 year old. It is not intended to diagnose infection nor to guide or monitor treatment.   Surgical pcr screen     Status: None   Collection Time: 01/31/15  8:08 PM  Result Value Ref Range Status   MRSA, PCR NEGATIVE NEGATIVE Final   Staphylococcus aureus NEGATIVE NEGATIVE Final    Comment:        The Xpert SA Assay (FDA approved for NASAL  specimens in patients over 33 years of age), is one component of a comprehensive surveillance program.  Test performance has been validated by [facility] for patients greater than or equal to 72 year old. It is not intended to diagnose infection nor to guide or monitor treatment.   Culture, blood (single)     Status: None   Collection Time: 02/02/15  4:25 PM  Result Value Ref Range Status   Specimen Description BLOOD RIGHT HAND  Final   Special Requests IN PEDIATRIC BOTTLE 1CC  Final   Culture NO GROWTH 5 DAYS  Final   Report Status 02/07/2015 FINAL  Final  Anaerobic culture     Status: None   Collection Time: 02/05/15  6:23 PM  Result Value Ref Range Status   Specimen Description WOUND  Final   Special Requests STERNAL WOUND   Final   Gram Stain   Final    NO WBC SEEN NO SQUAMOUS EPITHELIAL CELLS SEEN NO ORGANISMS SEEN Performed at [facility]    Culture   Final    NO ANAEROBES ISOLATED Performed at [facility]    Report Status 02/11/2015 FINAL  Final  Wound culture     Status: None   Collection Time: 02/05/15  6:23 PM  Result Value Ref Range Status   Specimen Description WOUND  Final   Special Requests STERNAL WOUND  Final   Gram Stain   Final  NO WBC SEEN NO SQUAMOUS EPITHELIAL CELLS SEEN NO ORGANISMS SEEN Performed at [facility]    Culture   Final    NO GROWTH 2 DAYS Performed at [facility]    Report Status 02/08/2015 FINAL  Final  Wound culture     Status: None   Collection Time: 02/08/15  4:06 PM  Result Value Ref Range Status   Specimen Description WOUND CHEST  Final   Special Requests PATIENT ON FOLLOWING ANCEF  Final   Gram Stain   Final    FEW WBC PRESENT, PREDOMINANTLY PMN NO SQUAMOUS EPITHELIAL CELLS SEEN NO ORGANISMS SEEN Performed at [facility]    Culture   Final    NO GROWTH 2 DAYS Performed at [facility]    Report Status 02/11/2015 FINAL  Final  Culture, respiratory  (NON-Expectorated)     Status: None   Collection Time: 02/09/15  7:56 AM  Result Value Ref Range Status   Specimen Description TRACHEAL ASPIRATE  Final   Special Requests Normal  Final   Gram Stain   Final    RARE WBC PRESENT, PREDOMINANTLY PMN NO SQUAMOUS EPITHELIAL CELLS SEEN NO ORGANISMS SEEN Performed at [facility]    Culture   Final    NO GROWTH 2 DAYS Performed at [facility]    Report Status 02/12/2015 FINAL  Final    Anti-infectives    Start     Dose/Rate Route Frequency Ordered Stop   02/11/15 0900  cefTAZidime (FORTAZ) 1 g in dextrose 5 % 50 mL IVPB     1 g 100 mL/hr over 30 Minutes Intravenous Every 12 hours 02/11/15 0823     02/10/15 2100  vancomycin (VANCOCIN) 500 mg in sodium chloride 0.9 % 100 mL IVPB     500 mg 100 mL/hr over 60 Minutes Intravenous Every 24 hours 02/10/15 0847     02/08/15 1559  vancomycin (VANCOCIN) 1,000 mg in sodium chloride 0.9 % 1,000 mL irrigation  Status:  Discontinued       As needed 02/08/15 1601 02/08/15 1729   02/08/15 0830  vancomycin (VANCOCIN) 500 mg in sodium chloride 0.9 % 100 mL IVPB  Status:  Discontinued     500 mg 100 mL/hr over 60 Minutes Intravenous Every 12 hours 02/08/15 0820 02/10/15 0847   02/08/15 0830  cefUROXime (ZINACEF) 1.5 g in dextrose 5 % 50 mL IVPB     1.5 g 100 mL/hr over 30 Minutes Intravenous Every 12 hours 02/08/15 0820 02/09/15 2050   02/06/15 0045  cefUROXime (ZINACEF) 1.5 g in dextrose 5 % 50 mL IVPB     1.5 g 100 mL/hr over 30 Minutes Intravenous Every 12 hours 02/05/15 2104 02/07/15 1155   02/05/15 1836  vancomycin (VANCOCIN) 1,000 mg in sodium chloride 0.9 % 1,000 mL irrigation  Status:  Discontinued       As needed 02/05/15 1836 02/05/15 2058   02/05/15 1300  cefUROXime (ZINACEF) 1.5 g in dextrose 5 % 50 mL IVPB     1.5 g 100 mL/hr over 30 Minutes Intravenous 3 times per day 02/05/15 0724 02/05/15 1326   02/04/15 0800  vancomycin (VANCOCIN) 500 mg in sodium chloride 0.9 % 100 mL  IVPB  Status:  Discontinued     500 mg 100 mL/hr over 60 Minutes Intravenous Every 24 hours 02/03/15 1142 02/05/15 2247   02/02/15 0800  vancomycin (VANCOCIN) IVPB 750 mg/150 ml premix  Status:  Discontinued     750 mg  150 mL/hr over 60 Minutes Intravenous Every 24 hours 02/01/15 1916 02/03/15 1142   02/01/15 2030  vancomycin (VANCOCIN) IVPB 750 mg/150 ml premix  Status:  Discontinued     750 mg 150 mL/hr over 60 Minutes Intravenous Every 12 hours 02/01/15 1855 02/01/15 1916   02/01/15 2000  vancomycin (VANCOCIN) IVPB 1000 mg/200 mL premix  Status:  Discontinued     1,000 mg 200 mL/hr over 60 Minutes Intravenous  Once 02/01/15 1811 02/01/15 1833   02/01/15 1930  cefUROXime (ZINACEF) 1.5 g in dextrose 5 % 50 mL IVPB     1.5 g 100 mL/hr over 30 Minutes Intravenous Every 12 hours 02/01/15 1811 02/03/15 0706   02/01/15 0400  vancomycin (VANCOCIN) 1,250 mg in sodium chloride 0.9 % 250 mL IVPB     1,250 mg 166.7 mL/hr over 90 Minutes Intravenous To Surgery 01/31/15 1114 02/01/15 0811   02/01/15 0400  cefUROXime (ZINACEF) 1.5 g in dextrose 5 % 50 mL IVPB     1.5 g 100 mL/hr over 30 Minutes Intravenous To Surgery 01/31/15 1114 02/01/15 1515   02/01/15 0400  cefUROXime (ZINACEF) 750 mg in dextrose 5 % 50 mL IVPB  Status:  Discontinued     750 mg 100 mL/hr over 30 Minutes Intravenous To Surgery 01/31/15 1114 02/01/15 1728     Assessment: ID: Vanc for prophy with centrimag in place. Post-op Zinacef. Now with persistent low grade fever - vanc resumed 10/3, WBC 13, PVT suspects HCAP continue ceftazidime  Vancomycin level drawn last night, resulted above goal again at 22. Scr improving to 1.2, uop looks good overnight 32ml/kg/hr. Will extend the dosing interval out to q36h to give her more time to clear doses. Expect new trough to be closer to 15 factoring in some accumulation.  Zinacef 10/1>>10/2 (4 doses) Vanc 9/26>>9/30, restarted 10/3>> 10/1 VR = 16 (vanc stopped night before) 10/5 VT = 24  (confirmed with RN that she drew it before dose was hung, once level resulted she stopped the infusion so pt only received a little) will extend interval to q24 10/9 VT =22-received dose after level, will again extend to q36h 10/6 Ceftazidime>> 9/27 bld cx: 1/1 ng 9/30 wound: ng 10/4 TA - ngtd  Goal of Therapy:  Vancomycin trough level 15-20 mcg/ml  Plan:  Measure antibiotic drug levels at steady state Follow up culture results Extend vancomycin dosing interval to q36 hours  Continue with current dose of ceftazidime  Sheppard Coil PharmD., BCPS Clinical Pharmacist Pager [phone] 02/15/2015 12:19 PM

## 2015-02-15 NOTE — Op Note (Signed)
Procedure note : Flexible video Bronchoscopy  for tracheostomy Indication: acute on chronic respiratory failure and failure to wean    Procedure done by Dr Marchelle Gearing. At first bronch was introduce through ET tube and structures of tracheal rings, carina identified for operator of tracheostomy who was Dr Tyson Alias. Light of bronch passed through trachea and skin for indentification of tracheal rings for tracheostomy puncture. After this, under bronchoscopy guidance,  ET tube was pulled back sufficiently and very carefully. The ET tube was  pulled back enough to give room for tracheostomy operator and yet at same time to to ensure a secured airway. After this was accomplished, bronchoscope was withdrawn into the ET tube. After this,  Dr Tyson Alias then performed tracheostomy under video visual provided by flexible video bronchoscopy. Followng introduction of tracheostomy,  the bronchoscope was removed from ET tube and introduced through tracheostomy. Correct position of tracheostomy was ensured, with enough room between carina and distal tracheostomy and no evidence of bleeding. The bronchoscope was then withdrawn. Respiratory therapist was then instructed to remove the ET tube.  Dr Tyson Alias then proceeded to complete the tracheostomy with stay sutures   No complications     Dr. Kalman Shan, M.D., [facility]C.P Pulmonary and Critical Care Medicine Staff Physician [facility] [place] [facility]monary and Critical Care Pager: [phone], If no answer or between  15:00h - 7:00h: call [phone]  02/15/2015 1:02 PM

## 2015-02-15 NOTE — Procedures (Signed)
Extubation Procedure Note  Patient Details:   Name: Breanna Alvarez DOB: Sep 26, 1950 MRN: [id number]   Airway Documentation:     Evaluation  O2 sats: stable throughout Complications: No apparent complications Patient did tolerate procedure well. Bilateral Breath Sounds: Coarse crackles Suctioning: Airway No   Patient was extubated due to having a tracheostomy. Patient had a #6 shiley cuffed trach placed without any apparent complications. RT will continue to monitor.  Darolyn Rua 02/15/2015, 3:41 PM

## 2015-02-16 ENCOUNTER — Inpatient Hospital Stay (HOSPITAL_COMMUNITY): Payer: Medicaid Other

## 2015-02-16 LAB — CBC
HCT: 31.6 % — ABNORMAL LOW (ref 36.0–46.0)
HCT: 29.9 % — ABNORMAL LOW (ref 36.0–46.0)
Hemoglobin: 10.6 g/dL — ABNORMAL LOW (ref 12.0–15.0)
Hemoglobin: 9.7 g/dL — ABNORMAL LOW (ref 12.0–15.0)
MCH: 28.6 pg (ref 26.0–34.0)
MCH: 27.7 pg (ref 26.0–34.0)
MCHC: 33.5 g/dL (ref 30.0–36.0)
MCHC: 32.4 g/dL (ref 30.0–36.0)
MCV: 85.4 fL (ref 78.0–100.0)
MCV: 85.4 fL (ref 78.0–100.0)
Platelets: 523 10*3/uL — ABNORMAL HIGH (ref 150–400)
Platelets: 524 10*3/uL — ABNORMAL HIGH (ref 150–400)
RBC: 3.7 MIL/uL — ABNORMAL LOW (ref 3.87–5.11)
RBC: 3.5 MIL/uL — ABNORMAL LOW (ref 3.87–5.11)
RDW: 18.1 % — ABNORMAL HIGH (ref 11.5–15.5)
RDW: 18 % — ABNORMAL HIGH (ref 11.5–15.5)
WBC: 13.1 10*3/uL — ABNORMAL HIGH (ref 4.0–10.5)
WBC: 9.4 10*3/uL (ref 4.0–10.5)

## 2015-02-16 LAB — GLUCOSE, CAPILLARY
GLUCOSE-CAPILLARY: 144 mg/dL — AB (ref 65–99)
Glucose-Capillary: 112 mg/dL — ABNORMAL HIGH (ref 65–99)
Glucose-Capillary: 133 mg/dL — ABNORMAL HIGH (ref 65–99)
Glucose-Capillary: 148 mg/dL — ABNORMAL HIGH (ref 65–99)
Glucose-Capillary: 148 mg/dL — ABNORMAL HIGH (ref 65–99)
Glucose-Capillary: 151 mg/dL — ABNORMAL HIGH (ref 65–99)

## 2015-02-16 LAB — COMPREHENSIVE METABOLIC PANEL
ALT: 69 U/L — ABNORMAL HIGH (ref 14–54)
AST: 38 U/L (ref 15–41)
Albumin: 2.2 g/dL — ABNORMAL LOW (ref 3.5–5.0)
Alkaline Phosphatase: 173 U/L — ABNORMAL HIGH (ref 38–126)
Anion gap: 9 (ref 5–15)
BUN: 58 mg/dL — ABNORMAL HIGH (ref 6–20)
CO2: 25 mmol/L (ref 22–32)
Calcium: 8.3 mg/dL — ABNORMAL LOW (ref 8.9–10.3)
Chloride: 107 mmol/L (ref 101–111)
Creatinine, Ser: 1.09 mg/dL — ABNORMAL HIGH (ref 0.44–1.00)
GFR calc Af Amer: >60 mL/min (ref >60)
GFR calc non Af Amer: 53 mL/min — ABNORMAL LOW (ref >60)
Glucose, Bld: 166 mg/dL — ABNORMAL HIGH (ref 65–99)
Potassium: 3.5 mmol/L (ref 3.5–5.1)
Sodium: 141 mmol/L (ref 135–145)
Total Bilirubin: 2 mg/dL — ABNORMAL HIGH (ref 0.3–1.2)
Total Protein: 5.9 g/dL — ABNORMAL LOW (ref 6.5–8.1)

## 2015-02-16 LAB — CARBOXYHEMOGLOBIN
Carboxyhemoglobin: 2.1 % — ABNORMAL HIGH (ref 0.5–1.5)
Methemoglobin: 0.8 % (ref 0.0–1.5)
O2 Saturation: 77.7 %
Total hemoglobin: 10.1 g/dL — ABNORMAL LOW (ref 12.0–16.0)

## 2015-02-16 MED ORDER — FUROSEMIDE 10 MG/ML IJ SOLN
40.0000 mg | Freq: Two times a day (BID) | INTRAMUSCULAR | Status: AC
  Administered 2015-02-16: 40 mg via INTRAVENOUS
  Administered 2015-02-16: 30 mg via INTRAVENOUS
  Filled 2015-02-16: qty 4

## 2015-02-16 MED ORDER — POTASSIUM CHLORIDE 20 MEQ/15ML (10%) PO SOLN
20.0000 meq | Freq: Two times a day (BID) | ORAL | Status: AC
  Administered 2015-02-16 (×2): 20 meq via ORAL
  Filled 2015-02-16: qty 15

## 2015-02-16 MED ORDER — FUROSEMIDE 10 MG/ML IJ SOLN
INTRAMUSCULAR | Status: AC
Start: 2015-02-16 — End: 2015-02-16
  Filled 2015-02-16: qty 2

## 2015-02-16 MED ORDER — FUROSEMIDE 10 MG/ML IJ SOLN
INTRAMUSCULAR | Status: AC
  Administered 2015-02-16: 10 mg
  Filled 2015-02-16: qty 4

## 2015-02-16 MED ORDER — POTASSIUM CHLORIDE 10 MEQ/50ML IV SOLN
INTRAVENOUS | Status: AC
  Filled 2015-02-16: qty 50

## 2015-02-16 MED ORDER — POTASSIUM CHLORIDE 10 MEQ/50ML IV SOLN
10.0000 meq | INTRAVENOUS | Status: AC
  Administered 2015-02-16 (×2): 10 meq via INTRAVENOUS
  Filled 2015-02-16 (×2): qty 50

## 2015-02-16 MED ORDER — POTASSIUM CHLORIDE 20 MEQ/15ML (10%) PO SOLN
ORAL | Status: AC
  Filled 2015-02-16: qty 15

## 2015-02-16 NOTE — Progress Notes (Signed)
PULMONARY / CRITICAL CARE MEDICINE   Name: Breanna Alvarez MRN: [id number] DOB: 06-Jul-1950    ADMISSION DATE:  01/26/2015 CONSULTATION DATE:  10/5  REFERRING MD :  Donata Clay   CHIEF COMPLAINT:  Vent management   INITIAL PRESENTATION: 63yo female never smoker with hx HTN, CAD previously refused CABG ultimately admitted 9/20 for CVTS eval and CABG after ongoing chest pain.  She underwent 4V CABG 9/26 and has had complicated course with cardiogenic shock s/p centrimag placement and removal, ischemic cardiomyopathy with EF 20%, open sternum now s/p sternal closure 10/3.  She remains intubated since initial CABG 9/26 with poor weaning and PCCM now consulted to assist.   STUDIES:  9/21 2D echo>> EF 20-25%, mod MR, PA press 51 mmHg 9/30 TEE>>> EF 30-35%, hypokinesis of apical myocardium, akinesis of anteroseptal myocardium, mild TR, improved LV function    SIGNIFICANT EVENTS: 9/26 CABG x 4, placement of centrimag L ventricular assistive device  9/30 removal centrimag  10/3 sternal closure 02/14/15:   Hg drop overnight by 1 gm and transfusion ordered. Hypotension overnight with increase in precedex.  02/06/15- s/p trach Tyson Alias   SUBJECTIVE/OVERNIGHT/INTERVAL HX 10//11/16 - Doing well post trach  . On milrinone and precedex. Off neo. Weaning but deconditioned (hx as gathered from bedside RN and d/w Dr Laneta Simmers) VITAL SIGNS: Temp:  [98.1 F (36.7 C)-100.2 F (37.9 C)] 99.3 F (37.4 C) (10/11 0830) Pulse Rate:  [83-134] 105 (10/11 0914) Resp:  [12-32] 28 (10/11 0914) BP: (89-181)/(32-97) 129/63 mmHg (10/11 0914) SpO2:  [93 %-100 %] 99 % (10/11 0914) FiO2 (%):  [30 %-100 %] 30 % (10/11 0914) Weight:  [53.2 kg (117 lb 4.6 oz)] 53.2 kg (117 lb 4.6 oz) (10/11 0500)   HEMODYNAMICS: CVP:  [10 mmHg-14 mmHg] 14 mmHg   VENTILATOR SETTINGS: Vent Mode:  [-] PRVC FiO2 (%):  [30 %-100 %] 30 % Set Rate:  [14 bmp] 14 bmp Vt Set:  [370 mL] 370 mL PEEP:  [5 cmH20] 5 cmH20 Plateau Pressure:   [23 cmH20-25 cmH20] 24 cmH20  INTAKE / OUTPUT:  Intake/Output Summary (Last 24 hours) at 02/16/15 1037 Last data filed at 02/16/15 0800  Gross per 24 hour  Intake 2103.74 ml  Output   1580 ml  Net 523.74 ml   PHYSICAL EXAMINATION: General:  Acute on chronic ill appearing female, VERY  DECONDITIONED Neuro: RASS- 1 on sedatino gtt. HEENT:  Moist mucus membranes, ETT. Cardiovascular:  S1, S2, RRR, Lungs:  Clear, no Wheeze or crackles. Abdomen:  Round, soft, non tender, +bs  Musculoskeletal:  Warm and dry, generalized edema   LABS: PULMONARY  Recent Labs Lab 02/10/15 1014  02/11/15 1607 02/12/15 0528  02/12/15 1555  02/14/15 0400 02/14/15 0415 02/15/15 0400 02/15/15 0430 02/16/15 0500  PHART 7.510*  --   --  7.452*  --  7.430  --   --  7.400  --  7.411  --   PCO2ART 42.3  --   --  39.4  --  42.4  --   --  41.4  --  36.4  --   PO2ART 122.0*  --   --  112*  --  102*  --   --  136*  --  88.2  --   HCO3 33.8*  --   --  27.1*  --  27.7*  --   --  25.0*  --  22.6  --   TCO2 35  < > 28 28.3  --  29.0  --   --  26.3  --  23.7  --   O2SAT 99.0  < >  --  98.6  < > 97.9  < > 59.9 98.7 78.7 96.7 77.7  < > = values in this interval not displayed.  CBC  Recent Labs Lab 02/14/15 0409 02/15/15 0416 02/16/15 0445  HGB 7.1* 10.6* 9.7*  HCT 21.0* 31.6* 29.9*  WBC 11.0* 13.1* 9.4  PLT 432* 523* 524*    COAGULATION  Recent Labs Lab 02/12/15 1010 02/13/15 0430  INR 1.56* 1.44    CARDIAC  No results for input(s): TROPONINI in the last 168 hours. No results for input(s): PROBNP in the last 168 hours.   CHEMISTRY  Recent Labs Lab 02/11/15 0520  02/12/15 0304 02/13/15 0430 02/14/15 0409 02/15/15 0416 02/16/15 0445  NA 130*  < > 135 138 137 139 141  K 3.5  < > 3.8 3.6 3.5 4.0 3.5  CL 89*  < > 93* 96* 96* 104 107  CO2 30  --  30 29 28 23 25   GLUCOSE 159*  < > 157* 176* 221* 163* 166*  BUN 41*  < > 47* 57* 65* 65* 58*  CREATININE 0.96  < > 1.31* 1.72* 1.56* 1.29*  1.09*  CALCIUM 8.2*  --  8.4* 8.4* 8.3* 8.5* 8.3*  MG 1.8  --  1.8  --  2.0 2.1  --   PHOS 3.6  --  3.8  --  4.9* 4.6  --   < > = values in this interval not displayed. Estimated Creatinine Clearance: 38.4 mL/min (by C-G formula based on Cr of 1.09).   LIVER  Recent Labs Lab 02/12/15 0304 02/12/15 1010 02/13/15 0430 02/14/15 0409 02/15/15 0416 02/16/15 0445  AST 227*  --  95* 48* 37 38  ALT 210*  --  149* 96* 82* 69*  ALKPHOS 337*  --  278* 201* 210* 173*  BILITOT 4.5*  --  3.1* 2.4* 2.2* 2.0*  PROT 5.8*  --  5.9* 6.0* 5.8* 5.9*  ALBUMIN 2.1*  --  2.2* 2.3* 2.2* 2.2*  INR  --  1.56* 1.44  --   --   --      INFECTIOUS No results for input(s): LATICACIDVEN, PROCALCITON in the last 168 hours.   ENDOCRINE CBG (last 3)   Recent Labs  02/15/15 2359 02/16/15 0418 02/16/15 0752  GLUCAP 148* 144* 151*         IMAGING x48h  - image(s) personally visualized  -   highlighted in bold Dg Chest Port 1 View  02/16/2015   CLINICAL DATA:  Pulmonary edema.  EXAM: PORTABLE CHEST 1 VIEW  COMPARISON:  02/15/2015.  FINDINGS: Tracheostomy tube, feeding tube, left PICC line stable position. Prior CABG. Cardiomegaly. Interim slight clearing of bilateral interstitial edema. Small bilateral pleural effusions have improved. Stable left apical density most likely related to pleural parenchymal scarring and/or atelectasis . No pneumothorax.  IMPRESSION: 1. Lines and tubes in stable position. 2. Prior CABG. Cardiomegaly. Interim partial clearing of pulmonary interstitial edema and pleural effusion.   Electronically Signed   By: Maisie Fus  Register   On: 02/16/2015 08:08   Dg Chest Port 1 View  02/15/2015   CLINICAL DATA:  Tracheostomy placement  EXAM: PORTABLE CHEST 1 VIEW  COMPARISON:  Chest radiograph from earlier today.  FINDINGS: Tracheostomy tube tip overlies the tracheal air column the just below thoracic inlet. Stable median sternotomy wires, midline skin staples and enteric tube entering  the chest with the tip not seen on  this image. Stable left PICC with tip in the lower third of the superior vena cava. No pneumothorax. Stable small bilateral pleural effusions. Stable cardiomediastinal silhouette with mild cardiomegaly. Mild to moderate pulmonary edema, not appreciably changed. Bibasilar lung opacities likely representing atelectasis, not appreciably changed.  IMPRESSION: 1. Well-positioned tracheostomy tube. 2. Stable mild to moderate congestive heart failure. 3. Stable small bilateral pleural effusions with bibasilar lung opacities likely representing atelectasis.   Electronically Signed   By: Delbert Phenix M.D.   On: 02/15/2015 13:27   Dg Chest Port 1 View  02/15/2015   CLINICAL DATA:  Intubation.  EXAM: PORTABLE CHEST 1 VIEW  COMPARISON:  None.  FINDINGS: Endotracheal tube and feeding tube in stable position. Left PICC line stable position. Prior CABG. Cardiomegaly with interim resolution of pulmonary venous congestion. Mild residual basilar interstitial prominence and small left pleural effusion. No pneumothorax.  IMPRESSION: 1. Lines and tubes in stable position. 2. Cardiomegaly. Interim clearing of pulmonary venous congestion. Mild residual basilar interstitial prominence and small left pleural effusion. Findings consistent with partial clearing of congestive heart failure.   Electronically Signed   By: Maisie Fus  Register   On: 02/15/2015 07:18        ASSESSMENT / PLAN:  PULMONARY OETT 9/26>>>10/7>>>10/7 > 10/10/'16 TRACH by Tyson Alias Acute on Chronic respiratory failure - multifactorial in setting heart failure, cardiogenic shock and severe deconditioning post multiple surgeries    - failure to wean,s/p trach   P:   Trach care Continue SBT protocols   CARDIOVASCULAR CVL R IJ cordis 9/26>>>10/2 LUA PICC >>>  CAD s/p CABG  Acute systolic heart failure  Ischemic cardiomyopathy  Cardiogenic shock   - on milrinone,  precedex sheduled + lopressor prn per [facility]  P:   Per cards and cvts\   RENAL  Recent Labs Lab 02/12/15 0304 02/13/15 0430 02/14/15 0409 02/15/15 0416 02/16/15 0445  CREATININE 1.31* 1.72* 1.56* 1.29* 1.09*      Hyponatremia  AKI - mild but worsening 10/7 with diuresis Hypokalemia   - creat slightly better /improving  P:   F/u chem  Replete electrolytes as needed KVO IVF Needs further diureses but given hemodynamics will hold. Hold off lasix.  GASTROINTESTINAL  Recent Labs Lab 02/12/15 0304 02/12/15 1010 02/13/15 0430 02/14/15 0409 02/15/15 0416 02/16/15 0445  AST 227*  --  95* 48* 37 38  ALT 210*  --  149* 96* 82* 69*  ALKPHOS 337*  --  278* 201* 210* 173*  BILITOT 4.5*  --  3.1* 2.4* 2.2* 2.0*  PROT 5.8*  --  5.9* 6.0* 5.8* 5.9*  ALBUMIN 2.1*  --  2.2* 2.3* 2.2* 2.2*  INR  --  1.56* 1.44  --   --   --     Protein calorie malnutrition  Transaminitis, ? Etiology. At risk shock liver. Not currently on statin or other offending meds  - ongoing tube feeds  P:   Check LFT prn TF per nutrition.  PPI  HEMATOLOGIC Thrombocytopenia - improved  Anemia - mild P:  F/u CBC Lovenox  INR stable for now at 1.44..  INFECTIOUS Open sternum > closed  P:   Wound (Sternum) 10/3>>>NTD Sputum 10/4>>> NTD  Vancomycin 10/5>>> Ceftaz 10/6>>   ENDOCRINE Hyperglycemia  P:   SSI  NEUROLOGIC Sedation requirement  P:   RASS goal: 0 Precedex at 1.0, will titrate down to hopefully improve hemodynamics. Seroquel qhs. Haldol prn Dilaudid prn PRN pain rx   FAMILY  - Updates:  Dr Tyson Alias consented  family for trach 02/15/15. None at bedside 02/16/2015     Dr. Kalman Shan, M.D., [facility].C.P Pulmonary and Critical Care Medicine Staff Physician [facility] [place] Pulmonary and Critical Care Pager: [phone], If no answer or between  15:00h - 7:00h: call [phone]  02/16/2015 10:37 AM

## 2015-02-16 NOTE — Progress Notes (Addendum)
TCTS DAILY ICU PROGRESS NOTE                   [address]            [place] [address]          [phone]   8 Days Post-Op Procedure(s) (LRB): STERNAL CLOSURE (N/A) TRANSESOPHAGEAL ECHOCARDIOGRAM (TEE) (N/A)  Total Length of Stay:  LOS: 21 days   Subjective:  [name] is not communicating with me this morning.  Nursing is at bed and states she speaks when she wants to.  She bit her tongue overnight and has been having some bleeding from that.  Objective: Vital signs in last 24 hours: Temp:  [98 F (36.7 C)-100.2 F (37.9 C)] 99.4 F (37.4 C) (10/11 0700) Pulse Rate:  [76-134] 110 (10/11 0700) Cardiac Rhythm:  [-] Sinus tachycardia (10/11 0400) Resp:  [5-32] 31 (10/11 0700) BP: (89-181)/(32-97) 123/51 mmHg (10/11 0700) SpO2:  [93 %-100 %] 98 % (10/11 0700) FiO2 (%):  [30 %-100 %] 30 % (10/11 0400) Weight:  [117 lb 4.6 oz (53.2 kg)] 117 lb 4.6 oz (53.2 kg) (10/11 0500)  Filed Weights   02/14/15 0600 02/15/15 0600 02/16/15 0500  Weight: 110 lb 7.2 oz (50.1 kg) 116 lb 13.5 oz (53 kg) 117 lb 4.6 oz (53.2 kg)    Weight change: 7.1 oz (0.2 kg)   Hemodynamic parameters for last 24 hours: CVP:  [10 mmHg-11 mmHg] 11 mmHg  Intake/Output from previous day: 10/10 0701 - 10/11 0700 In: 2143.4 [I.V.:970.9; NG/GT:1072.5; IV Piggyback:100] Out: 1810 [Urine:1560; Stool:250]  Current Meds: Scheduled Meds: . acetaminophen (TYLENOL) oral liquid 160 mg/5 mL  650 mg Oral Q6H  . antiseptic oral rinse  7 mL Mouth Rinse QID  . aspirin EC  325 mg Oral Daily   Or  . aspirin  324 mg Per Tube Daily  . cefTAZidime (FORTAZ)  IV  1 g Intravenous Q12H  . chlorhexidine gluconate  15 mL Mouth Rinse BID  . enoxaparin (LOVENOX) injection  40 mg Subcutaneous Q24H  . etomidate  40 mg Intravenous Once  . famotidine  20 mg Per Tube BID  . insulin aspart  0-24 Units Subcutaneous 6 times per day  . metoCLOPramide (REGLAN) injection  10 mg Intravenous 4 times per day  .  mupirocin ointment  1 application Topical BID  . potassium chloride  10 mEq Intravenous Q1 Hr x 3  . potassium chloride      . QUEtiapine  50 mg Oral QHS  . sodium chloride  10-40 mL Intracatheter Q12H  . sodium chloride  3 mL Intravenous Q12H  . vancomycin  500 mg Intravenous Q36H   Continuous Infusions: . sodium chloride 20 mL/hr at 02/16/15 0400  . dexmedetomidine 1 mcg/kg/hr (02/16/15 0600)  . feeding supplement (VITAL AF 1.2 CAL) 1,000 mL (02/16/15 0400)  . milrinone 0.25 mcg/kg/min (02/16/15 0400)  . norepinephrine (LEVOPHED) Adult infusion Stopped (02/12/15 0156)  . phenylephrine (NEO-SYNEPHRINE) Adult infusion Stopped (02/16/15 0200)   PRN Meds:.albuterol, fentaNYL (SUBLIMAZE) injection, haloperidol lactate, HYDROmorphone (DILAUDID) injection, metoprolol, midazolam, ondansetron (ZOFRAN) IV, sodium chloride, sodium chloride  General appearance: alert, no distress and uncooperative Heart: regular rate and rhythm Lungs: coarse bilaterally Abdomen: soft, non-tender; bowel sounds normal; no masses,  no organomegaly Extremities: edema trace Wound: clean and dry, staples remain in place on sternotomy  Lab Results: CBC: Recent Labs  02/15/15 0416 02/16/15 0445  WBC 13.1* 9.4  HGB 10.6* 9.7*  HCT 31.6*  29.9*  PLT 523* 524*   BMET:  Recent Labs  02/15/15 0416 02/16/15 0445  NA 139 141  K 4.0 3.5  CL 104 107  CO2 23 25  GLUCOSE 163* 166*  BUN 65* 58*  CREATININE 1.29* 1.09*  CALCIUM 8.5* 8.3*    PT/INR: No results for input(s): LABPROT, INR in the last 72 hours. Radiology: Dg Chest Port 1 View  02/15/2015   CLINICAL DATA:  Tracheostomy placement  EXAM: PORTABLE CHEST 1 VIEW  COMPARISON:  Chest radiograph from earlier today.  FINDINGS: Tracheostomy tube tip overlies the tracheal air column the just below thoracic inlet. Stable median sternotomy wires, midline skin staples and enteric tube entering the chest with the tip not seen on this image. Stable left PICC with  tip in the lower third of the superior vena cava. No pneumothorax. Stable small bilateral pleural effusions. Stable cardiomediastinal silhouette with mild cardiomegaly. Mild to moderate pulmonary edema, not appreciably changed. Bibasilar lung opacities likely representing atelectasis, not appreciably changed.  IMPRESSION: 1. Well-positioned tracheostomy tube. 2. Stable mild to moderate congestive heart failure. 3. Stable small bilateral pleural effusions with bibasilar lung opacities likely representing atelectasis.   Electronically Signed   By: Delbert Phenix M.D.   On: 02/15/2015 13:27    CLINICAL DATA: Pulmonary edema.  EXAM: PORTABLE CHEST 1 VIEW  COMPARISON: 02/15/2015.  FINDINGS: Tracheostomy tube, feeding tube, left PICC line stable position. Prior CABG. Cardiomegaly. Interim slight clearing of bilateral interstitial edema. Small bilateral pleural effusions have improved. Stable left apical density most likely related to pleural parenchymal scarring and/or atelectasis . No pneumothorax.  IMPRESSION: 1. Lines and tubes in stable position. 2. Prior CABG. Cardiomegaly. Interim partial clearing of pulmonary interstitial edema and pleural effusion.   Electronically Signed  By: Maisie Fus Register  On: 02/16/2015 08:08   Assessment/Plan: S/P Procedure(s) (LRB): STERNAL CLOSURE (N/A) TRANSESOPHAGEAL ECHOCARDIOGRAM (TEE) (N/A)  1. CV- remains on Milrinone drip. Weaning Neo as tolerated, HF team has not seen since 10/8 2. Pulm- underwent bedside trach done yesterday, trach trials not started yet, CXR  3. Renal- creatinine stable, remains hypervolemic, will give IV Lasix today 4. Expected Post operative blood loss anemia- Hgb remains stable 5. CBGs controlled, continue SSIP 6. Dispo- patient stable on Milrinone drip. Weaning Neo as tolerated, continue current care     Lowella Dandy 02/16/2015 7:57 AM   Chart reviewed, patient examined, agree with above. She tolerated  trach well and is ready for vent wean per CCM. She is hemodynamically stable off neo. Will start diuresis. Creat is normal now.

## 2015-02-16 NOTE — Progress Notes (Signed)
Patient ID: Breanna Alvarez, female   DOB: 02/03/1951, 64 y.o.   MRN: [id number]  SICU Evening Rounds:  Hemodynamically stable Diuresing well Remains on vent.

## 2015-02-17 ENCOUNTER — Inpatient Hospital Stay (HOSPITAL_COMMUNITY): Payer: Medicaid Other

## 2015-02-17 DIAGNOSIS — E440 Moderate protein-calorie malnutrition: Secondary | ICD-10-CM | POA: Insufficient documentation

## 2015-02-17 LAB — POCT I-STAT, CHEM 8
BUN: 56 mg/dL — ABNORMAL HIGH (ref 6–20)
CALCIUM ION: 1.16 mmol/L (ref 1.13–1.30)
Chloride: 112 mmol/L — ABNORMAL HIGH (ref 101–111)
Creatinine, Ser: 1 mg/dL (ref 0.44–1.00)
GLUCOSE: 209 mg/dL — AB (ref 65–99)
HCT: 33 % — ABNORMAL LOW (ref 36.0–46.0)
HEMOGLOBIN: 11.2 g/dL — AB (ref 12.0–15.0)
Potassium: 4.3 mmol/L (ref 3.5–5.1)
SODIUM: 147 mmol/L — AB (ref 135–145)
TCO2: 20 mmol/L (ref 0–100)

## 2015-02-17 LAB — GLUCOSE, CAPILLARY
GLUCOSE-CAPILLARY: 127 mg/dL — AB (ref 65–99)
GLUCOSE-CAPILLARY: 129 mg/dL — AB (ref 65–99)
GLUCOSE-CAPILLARY: 144 mg/dL — AB (ref 65–99)
GLUCOSE-CAPILLARY: 157 mg/dL — AB (ref 65–99)
GLUCOSE-CAPILLARY: 176 mg/dL — AB (ref 65–99)
Glucose-Capillary: 140 mg/dL — ABNORMAL HIGH (ref 65–99)
Glucose-Capillary: 195 mg/dL — ABNORMAL HIGH (ref 65–99)

## 2015-02-17 LAB — COMPREHENSIVE METABOLIC PANEL
ALT: 69 U/L — ABNORMAL HIGH (ref 14–54)
AST: 39 U/L (ref 15–41)
Albumin: 2.4 g/dL — ABNORMAL LOW (ref 3.5–5.0)
Alkaline Phosphatase: 187 U/L — ABNORMAL HIGH (ref 38–126)
Anion gap: 8 (ref 5–15)
BUN: 47 mg/dL — ABNORMAL HIGH (ref 6–20)
CO2: 25 mmol/L (ref 22–32)
Calcium: 8.1 mg/dL — ABNORMAL LOW (ref 8.9–10.3)
Chloride: 109 mmol/L (ref 101–111)
Creatinine, Ser: 0.97 mg/dL (ref 0.44–1.00)
GFR calc Af Amer: >60 mL/min (ref >60)
GFR calc non Af Amer: >60 mL/min (ref >60)
Glucose, Bld: 185 mg/dL — ABNORMAL HIGH (ref 65–99)
Potassium: 3.7 mmol/L (ref 3.5–5.1)
Sodium: 142 mmol/L (ref 135–145)
Total Bilirubin: 1.9 mg/dL — ABNORMAL HIGH (ref 0.3–1.2)
Total Protein: 6.5 g/dL (ref 6.5–8.1)

## 2015-02-17 LAB — CARBOXYHEMOGLOBIN
Carboxyhemoglobin: 1.9 % — ABNORMAL HIGH (ref 0.5–1.5)
Methemoglobin: 0.8 % (ref 0.0–1.5)
O2 Saturation: 82.9 %
Total hemoglobin: 9.4 g/dL — ABNORMAL LOW (ref 12.0–16.0)

## 2015-02-17 LAB — CBC
HCT: 29.6 % — ABNORMAL LOW (ref 36.0–46.0)
Hemoglobin: 9.7 g/dL — ABNORMAL LOW (ref 12.0–15.0)
MCH: 28.6 pg (ref 26.0–34.0)
MCHC: 32.8 g/dL (ref 30.0–36.0)
MCV: 87.3 fL (ref 78.0–100.0)
Platelets: 548 10*3/uL — ABNORMAL HIGH (ref 150–400)
RBC: 3.39 MIL/uL — ABNORMAL LOW (ref 3.87–5.11)
RDW: 18.3 % — ABNORMAL HIGH (ref 11.5–15.5)
WBC: 11.5 10*3/uL — ABNORMAL HIGH (ref 4.0–10.5)

## 2015-02-17 MED ORDER — ALPRAZOLAM 0.5 MG PO TABS
0.5000 mg | ORAL_TABLET | Freq: Every day | ORAL | Status: DC
  Administered 2015-02-18: 0.5 mg via ORAL
  Filled 2015-02-17: qty 1

## 2015-02-17 MED ORDER — PHENYLEPHRINE HCL 10 MG/ML IJ SOLN: 0.0000 ug/min | INTRAVENOUS | Status: DC

## 2015-02-17 MED ORDER — POTASSIUM CHLORIDE 10 MEQ/50ML IV SOLN
10.0000 meq | INTRAVENOUS | Status: AC
  Administered 2015-02-17 (×3): 10 meq via INTRAVENOUS
  Filled 2015-02-17 (×3): qty 50

## 2015-02-17 MED ORDER — ADULT MULTIVITAMIN LIQUID CH
5.0000 mL | Freq: Every day | ORAL | Status: DC
  Administered 2015-02-17: 5 mL via ORAL
  Filled 2015-02-17 (×2): qty 5

## 2015-02-17 MED ORDER — MIDAZOLAM HCL 2 MG/2ML IJ SOLN
2.0000 mg | INTRAMUSCULAR | Status: DC | PRN
  Administered 2015-02-17: 1 mg via INTRAVENOUS
  Administered 2015-02-17: 2 mg via INTRAVENOUS
  Administered 2015-02-17 – 2015-02-18 (×2): 1 mg via INTRAVENOUS
  Administered 2015-02-18: 2 mg via INTRAVENOUS
  Filled 2015-02-17 (×5): qty 2

## 2015-02-17 MED ORDER — LEVALBUTEROL HCL 1.25 MG/0.5ML IN NEBU
1.2500 mg | INHALATION_SOLUTION | Freq: Four times a day (QID) | RESPIRATORY_TRACT | Status: DC
  Administered 2015-02-17 – 2015-02-22 (×19): 1.25 mg via RESPIRATORY_TRACT
  Filled 2015-02-17 (×25): qty 0.5

## 2015-02-17 MED ORDER — FUROSEMIDE 10 MG/ML IJ SOLN
40.0000 mg | Freq: Two times a day (BID) | INTRAMUSCULAR | Status: DC
  Administered 2015-02-18: 40 mg via INTRAVENOUS
  Filled 2015-02-17 (×4): qty 4

## 2015-02-17 MED ORDER — PIPERACILLIN-TAZOBACTAM 3.375 G IVPB
3.3750 g | Freq: Three times a day (TID) | INTRAVENOUS | Status: DC
  Administered 2015-02-17 – 2015-02-26 (×27): 3.375 g via INTRAVENOUS
  Filled 2015-02-17 (×29): qty 50

## 2015-02-17 MED ORDER — FUROSEMIDE 10 MG/ML IJ SOLN
40.0000 mg | Freq: Two times a day (BID) | INTRAMUSCULAR | Status: DC
  Administered 2015-02-17: 40 mg via INTRAVENOUS
  Filled 2015-02-17: qty 4

## 2015-02-17 MED ORDER — HALOPERIDOL LACTATE 5 MG/ML IJ SOLN: 1.0000 mg | Freq: Four times a day (QID) | INTRAMUSCULAR | Status: DC | PRN

## 2015-02-17 MED ORDER — VITAL AF 1.2 CAL PO LIQD
1000.0000 mL | ORAL | Status: DC
  Filled 2015-02-17 (×3): qty 1000

## 2015-02-17 NOTE — Progress Notes (Signed)
eLink Physician-Brief Progress Note Patient Name: Breanna QuarryMaria T Alvarez DOB: 03-11-1951 MRN: [id number]   Date of Service  02/17/2015  HPI/Events of Note  RN notes tracheal secretions smell like tube feeds.   eICU Interventions  Hold tube feed; ordered nutrition consult for methyl blue dye test for ?aspiration.      Intervention Category Intermediate Interventions: Diagnostic test evaluation  Shane Crutchradeep Lanea Vankirk 02/17/2015, 11:12 PM

## 2015-02-17 NOTE — Progress Notes (Addendum)
E-link notified of increasing secretions of thin consistency that look and smell like tube feed coming from and around the trach site as well as tachypnea 35-45 RR, increasingly labored respirations, and tachycardia >130 unrelieved by prn lopressor. RT called to room to suction with little amount of thin and clear secretions coming up. MD instructed this nurse to hold tube feed until AM to check for possible aspiration. Will continue to closely monitor. Modena JanskyKevin Eldra Word Rn 2 [facility]

## 2015-02-17 NOTE — Progress Notes (Signed)
In patients room giving medication when pt pulls panda tube out approximately 20cm. Tube feedings immediately stopped and panda re advanced. Placement auscultated and E-link notified of need for chest x-ray to confirm placement. Will continue to hold tube feedings per protocol until confirmation via X-ray. Will continue to closely monitor. Breanna JanskyKevin Jema Deegan Rn 2 [facility]

## 2015-02-17 NOTE — Progress Notes (Signed)
While attempting to wean precedex off per CCM 2mg  versed was given because pt was becoming restless. After 2mg  versed given BP dropped to 70s/30s. CCM notified of this order to restart neo while bp low from versed. Will cont to monitor and assess pt.

## 2015-02-17 NOTE — Progress Notes (Signed)
02/17/2015 1230 KUB confirmed safe placement of panda tube, TF resumed per protocol. Will continue to closely monitor patient.  Eveny Anastas, Blanchard KelchStephanie Ingold

## 2015-02-17 NOTE — Progress Notes (Addendum)
Nutrition Follow-up  DOCUMENTATION CODES:   Not applicable  INTERVENTION:   Increase Vital AF 1.2 formula to goal rate of 50 ml/hr. TF regimen to provide 1440 kcals, 90 gm protein, 973 ml of free water.  Order liquid adult multivitamin.   NUTRITION DIAGNOSIS:   Inadequate oral intake related to inability to eat as evidenced by NPO status.  Ongoing.  GOAL:   Patient will meet greater than or equal to 90% of their needs  Met.   MONITOR:   TF tolerance, Vent status, Labs, Weight trends, I & O's  ASSESSMENT:   64 yo female never smoker with hx HTN, CAD previously refused CABG ultimately admitted 9/20 for CVTS eval and CABG after ongoing chest pain. She underwent 4V CABG 9/26 and has had complicated course with cardiogenic shock s/p centrimag placement and removal, ischemic cardiomyopathy with EF 20%, open sternum now s/p sternal closure 10/3. She remains intubated since initial CABG 9/26 with poor weaning and PCCM now consulted to assist.   Patient s/p procedure 9/26: CORONARY ARTERY BYPASS GRAFTING x 4 PLACEMENT OF CENTRIMAG VENTRICULAR ASSIST DEVICE  Patient is currently on ventilator support via trach MV:12.9 L/min Temp (24hrs), Avg:99.1 F (37.3 C), Min:97.7 F (36.5 C), Max:100.6 F (38.1 C)   Vital 1.2 formula initiated 10/5 via NGT (tip beyond the ligament of Treitz in jejunum). Currently infusing at 45 ml/hr providing 1296 kcals, 81 gm protein, 876 ml of free water. Due to increased needs, will increase to goal rate of 50 mL/hr.   Labs reviewed: high CBG (140-195), high BUN (47)  Medications reviewed.   Student-Dietitian unable to complete Nutrition Focused Physical Exam.  Suspect malnutrition, however, unable to identify at this time.  Diet Order:  Diet NPO time specified TPN (CLINIMIX-E) Adult  Skin:  Wound (see comment) (chest wound VAC)  Last BM:  02/17/2015  Height:   Ht Readings from Last 1 Encounters:  01/26/15 5' (1.524 m)    Weight:    Wt Readings from Last 1 Encounters:  02/18/15 110 lb 7.2 oz (50.1 kg)    Ideal Body Weight:  45.4 kg  BMI:  Body mass index is 21.57 kg/(m^2).  Estimated Nutritional Needs:   Kcal:  1426 kcal  Protein:  85-95 g  Fluid:  > 1.5 L  EDUCATION NEEDS:   No education needs identified at this time  Kayleen Memos, BA. BS. Dietetic Intern 02/18/2015 10:27 AM

## 2015-02-17 NOTE — Progress Notes (Signed)
PULMONARY / CRITICAL CARE MEDICINE   Name: Breanna QuarryMaria T Alvarez MRN: [id number] DOB: 11/28/1950    ADMISSION DATE:  01/26/2015 CONSULTATION DATE:  10/5  REFERRING MD :  Donata ClayVan Trigt   CHIEF COMPLAINT:  Vent management   INITIAL PRESENTATION: 64yo female never smoker with hx HTN, CAD previously refused CABG ultimately admitted 9/20 for CVTS eval and CABG after ongoing chest pain.  She underwent 4V CABG 9/26 and has had complicated course with cardiogenic shock s/p centrimag placement and removal, ischemic cardiomyopathy with EF 20%, open sternum now s/p sternal closure 10/3.  She remains intubated since initial CABG 9/26 with poor weaning and PCCM now consulted to assist.   STUDIES:  9/21 2D echo>> EF 20-25%, mod MR, PA press 51 mmHg 9/30 TEE>>> EF 30-35%, hypokinesis of apical myocardium, akinesis of anteroseptal myocardium, mild TR, improved LV function    SIGNIFICANT EVENTS: 9/26 CABG x 4, placement of centrimag L ventricular assistive device  9/30 removal centrimag  10/3 sternal closure 02/14/15:   Hg drop overnight by 1 gm and transfusion ordered. Hypotension overnight with increase in precedex.  02/06/15- s/p trach - Tyson AliasFeinstein  10//11/16 - Doing well post trach  . On milrinone and precedex. Off neo. Weaning but deconditioned (hx as gathered from bedside RN and d/w Dr Laneta SimmersBartle). TMax 65F. CXR worse    SUBJECTIVE/OVERNIGHT/INTERVAL HX 02/17/15 - d/w RN - uanble to wean precedex due to agitation. On milrinone and precedex. Very deconditioned.    VITAL SIGNS: Temp:  [98.2 F (36.8 C)-99.9 F (37.7 C)] 98.7 F (37.1 C) (10/12 0805) Pulse Rate:  [67-137] 102 (10/12 0807) Resp:  [14-39] 34 (10/12 0807) BP: (102-158)/(53-110) 116/55 mmHg (10/12 0807) SpO2:  [63 %-100 %] 94 % (10/12 0805) FiO2 (%):  [30 %-50 %] 50 % (10/12 0807) Weight:  [50.2 kg (110 lb 10.7 oz)] 50.2 kg (110 lb 10.7 oz) (10/12 0458)   HEMODYNAMICS: CVP:  [10 mmHg-19 mmHg] 19 mmHg   VENTILATOR SETTINGS: Vent  Mode:  [-] PRVC FiO2 (%):  [30 %-50 %] 50 % Set Rate:  [14 bmp] 14 bmp Vt Set:  [370 mL] 370 mL PEEP:  [5 cmH20] 5 cmH20 Pressure Support:  [5 cmH20] 5 cmH20  INTAKE / OUTPUT:  Intake/Output Summary (Last 24 hours) at 02/17/15 0931 Last data filed at 02/17/15 [id number]  Gross per 24 hour  Intake 2322.07 ml  Output   3830 ml  Net -1507.93 ml   PHYSICAL EXAMINATION: General:  Acute on chronic ill appearing female, VERY  DECONDITIONED Neuro: RASS 0 on sedatino gtt . HEENT:  Moist mucus membranes, ETT. Cardiovascular:  S1, S2, RRR, Lungs:  Clear, no Wheeze or crackles. Abdomen:  Round, soft, non tender, +bs  Musculoskeletal:  Warm and dry, generalized edema   LABS: PULMONARY  Recent Labs Lab 02/10/15 1014  02/11/15 1607 02/12/15 0528  02/12/15 1555  02/14/15 0415 02/15/15 0400 02/15/15 0430 02/16/15 0500 02/17/15 0414  PHART 7.510*  --   --  7.452*  --  7.430  --  7.400  --  7.411  --   --   PCO2ART 42.3  --   --  39.4  --  42.4  --  41.4  --  36.4  --   --   PO2ART 122.0*  --   --  112*  --  102*  --  136*  --  88.2  --   --   HCO3 33.8*  --   --  27.1*  --  27.7*  --  25.0*  --  22.6  --   --   TCO2 35  < > 28 28.3  --  29.0  --  26.3  --  23.7  --   --   O2SAT 99.0  < >  --  98.6  < > 97.9  < > 98.7 78.7 96.7 77.7 82.9  < > = values in this interval not displayed.  CBC  Recent Labs Lab 02/15/15 0416 02/16/15 0445 02/17/15 0415  HGB 10.6* 9.7* 9.7*  HCT 31.6* 29.9* 29.6*  WBC 13.1* 9.4 11.5*  PLT 523* 524* 548*    COAGULATION  Recent Labs Lab 02/12/15 1010 02/13/15 0430  INR 1.56* 1.44    CARDIAC  No results for input(s): TROPONINI in the last 168 hours. No results for input(s): PROBNP in the last 168 hours.   CHEMISTRY  Recent Labs Lab 02/11/15 0520  02/12/15 0304 02/13/15 0430 02/14/15 0409 02/15/15 0416 02/16/15 0445 02/17/15 0415  NA 130*  < > 135 138 137 139 141 142  K 3.5  < > 3.8 3.6 3.5 4.0 3.5 3.7  CL 89*  < > 93* 96* 96* 104  107 109  CO2 30  --  GLUCOSE 159*  < > 157* 176* 221* 163* 166* 185*  BUN 41*  < > 47* 57* 65* 65* 58* 47*  CREATININE 0.96  < > 1.31* 1.72* 1.56* 1.29* 1.09* 0.97  CALCIUM 8.2*  --  8.4* 8.4* 8.3* 8.5* 8.3* 8.1*  MG 1.8  --  1.8  --  2.0 2.1  --   --   PHOS 3.6  --  3.8  --  4.9* 4.6  --   --   < > = values in this interval not displayed. Estimated Creatinine Clearance: 43.2 mL/min (by C-G formula based on Cr of 0.97).   LIVER  Recent Labs Lab 02/12/15 1010 02/13/15 0430 02/14/15 0409 02/15/15 0416 02/16/15 0445 02/17/15 0415  AST  --  95* 48* 37 38 39  ALT  --  149* 96* 82* 69* 69*  ALKPHOS  --  278* 201* 210* 173* 187*  BILITOT  --  3.1* 2.4* 2.2* 2.0* 1.9*  PROT  --  5.9* 6.0* 5.8* 5.9* 6.5  ALBUMIN  --  2.2* 2.3* 2.2* 2.2* 2.4*  INR 1.56* 1.44  --   --   --   --      INFECTIOUS No results for input(s): LATICACIDVEN, PROCALCITON in the last 168 hours.   ENDOCRINE CBG (last 3)   Recent Labs  02/16/15 2349 02/17/15 0353 02/17/15 0801  GLUCAP 127* 140* 195*         IMAGING x48h  - image(s) personally visualized  -   highlighted in bold Dg Chest Port 1 View  02/17/2015  CLINICAL DATA:  New onset shortness of breath.  Weakness. EXAM: PORTABLE CHEST 1 VIEW COMPARISON:  One day prior FINDINGS: Tracheostomy appropriately positioned. Feeding tube extends beyond the inferior aspect of the film. Left-sided PICC line terminates at the low SVC. Prior median sternotomy. Cardiomegaly accentuated by AP portable technique. Layering bilateral pleural effusions. No pneumothorax. Interstitial edema is moderate and slightly increased. Lower lobe predominant airspace disease is worse on the right. IMPRESSION: Slight worsening aeration. Increased congestive heart failure with layering bilateral pleural effusions. Progressive bibasilar airspace disease which could represent atelectasis or infection/ aspiration. Electronically Signed   By: Jeronimo Greaves M.D.   On:  02/17/2015 08:34   Dg Chest  Port 1 View  02/16/2015  CLINICAL DATA:  Pulmonary edema. EXAM: PORTABLE CHEST 1 VIEW COMPARISON:  02/15/2015. FINDINGS: Tracheostomy tube, feeding tube, left PICC line stable position. Prior CABG. Cardiomegaly. Interim slight clearing of bilateral interstitial edema. Small bilateral pleural effusions have improved. Stable left apical density most likely related to pleural parenchymal scarring and/or atelectasis . No pneumothorax. IMPRESSION: 1. Lines and tubes in stable position. 2. Prior CABG. Cardiomegaly. Interim partial clearing of pulmonary interstitial edema and pleural effusion. Electronically Signed   By: Maisie Fus  Register   On: 02/16/2015 08:08   Dg Chest Port 1 View  02/15/2015  CLINICAL DATA:  Tracheostomy placement EXAM: PORTABLE CHEST 1 VIEW COMPARISON:  Chest radiograph from earlier today. FINDINGS: Tracheostomy tube tip overlies the tracheal air column the just below thoracic inlet. Stable median sternotomy wires, midline skin staples and enteric tube entering the chest with the tip not seen on this image. Stable left PICC with tip in the lower third of the superior vena cava. No pneumothorax. Stable small bilateral pleural effusions. Stable cardiomediastinal silhouette with mild cardiomegaly. Mild to moderate pulmonary edema, not appreciably changed. Bibasilar lung opacities likely representing atelectasis, not appreciably changed. IMPRESSION: 1. Well-positioned tracheostomy tube. 2. Stable mild to moderate congestive heart failure. 3. Stable small bilateral pleural effusions with bibasilar lung opacities likely representing atelectasis. Electronically Signed   By: Delbert Phenix M.D.   On: 02/15/2015 13:27        ASSESSMENT / PLAN:  PULMONARY OETT 9/26>>>10/7>>>10/7 > 10/10/'16 TRACH by Tyson Alias Acute on Chronic respiratory failure - multifactorial in setting heart failure, cardiogenic shock and severe deconditioning post multiple surgeries    -  failure to wean,s/p trach . Currently deconditioning and pulmonary infitlrates and agitation preventing good wean  P:   Trach care Continue SBT protocols   CARDIOVASCULAR CVL R IJ cordis 9/26>>>10/2 LUA PICC >>>  CAD s/p CABG  Acute systolic heart failure  Ischemic cardiomyopathy  Cardiogenic shock   - on milrinone,  precedex sheduled + lopressor prn per [facility]  P:  Per cards and cvts   RENAL  Recent Labs Lab 02/13/15 0430 02/14/15 0409 02/15/15 0416 02/16/15 0445 02/17/15 0415  CREATININE 1.72* 1.56* 1.29* 1.09* 0.97      Hyponatremia  AKI - mild but worsening 10/7 with diuresis Hypokalemia   - creat slightly better /improving  P:   F/u chem  Replete electrolytes as needed KVO IVF Needs further diureses but given hemodynamics will hold. Continue lasix Continue lopressor prn  GASTROINTESTINAL  Recent Labs Lab 02/12/15 1010 02/13/15 0430 02/14/15 0409 02/15/15 0416 02/16/15 0445 02/17/15 0415  AST  --  95* 48* 37 38 39  ALT  --  149* 96* 82* 69* 69*  ALKPHOS  --  278* 201* 210* 173* 187*  BILITOT  --  3.1* 2.4* 2.2* 2.0* 1.9*  PROT  --  5.9* 6.0* 5.8* 5.9* 6.5  ALBUMIN  --  2.2* 2.3* 2.2* 2.2* 2.4*  INR 1.56* 1.44  --   --   --   --     Protein calorie malnutrition  Transaminitis, ? Etiology. At risk shock liver. Not currently on statin or other offending meds  - ongoing tube feeds  P:   Check LFT prn TF per nutrition.  PPI  HEMATOLOGIC Thrombocytopenia - improved  Anemia - mild P:  F/u CBC Lovenox    INFECTIOUS Wound (Sternum) 10/3>>>NTD Sputum 10/4>>> NTD  Open sternum > closed    02/17/15 - Temp 33F. Increased pulm infitlrate -  likely fluid   P:   Vancomycin 10/5>>> Ceftaz 10/6>>  Send resp culture to be on safe side 02/17/15  ENDOCRINE Hyperglycemia  P:   SSI  NEUROLOGIC Agitted . Needing higher precededx  P:   RASS goal: 0 Precedex at 1.0, will titrate down to hopefully improve hemodynamics. Seroquel  qhs. Haldol prn Dilaudid prn Add versed prn in efrot to wean off precedex PRN pain rx   FAMILY  - Updates:  Dr Tyson Alias consented family for trach 02/15/15. None at bedside 02/17/2015     Dr. Kalman Shan, M.D., [facility].C.P Pulmonary and Critical Care Medicine Staff Physician [facility] [place] [facility] Pager: [phone], If no answer or between  15:00h - 7:00h: call [phone]  02/17/2015 9:31 AM

## 2015-02-17 NOTE — Progress Notes (Signed)
RT called to bedside for increased work of breathing and decreased o2 sats not responding to suctioning. Rt in room to assess. Will continue to closely monitor.

## 2015-02-17 NOTE — Progress Notes (Addendum)
[facility] DAILY ICU PROGRESS NOTE                   [address]            [place] [address]          [phone]   9 Days Post-Op Procedure(s) (LRB): STERNAL CLOSURE (N/A) TRANSESOPHAGEAL ECHOCARDIOGRAM (TEE) (N/A)  Total Length of Stay:  LOS: 22 days   Subjective:  Breanna Alvarez is alert, responsive this morning.  Per nursing the patient is desating and her vent settings needed increased.  Objective: Vital signs in last 24 hours: Temp:  [98.2 F (36.8 C)-99.9 F (37.7 C)] 98.7 F (37.1 C) (10/12 0805) Pulse Rate:  [67-137] 102 (10/12 0807) Cardiac Rhythm:  [-] Sinus tachycardia (10/12 0800) Resp:  [14-39] 34 (10/12 0807) BP: (102-158)/(53-110) 116/55 mmHg (10/12 0807) SpO2:  [63 %-100 %] 94 % (10/12 0805) FiO2 (%):  [30 %-50 %] 50 % (10/12 0807) Weight:  [110 lb 10.7 oz (50.2 kg)] 110 lb 10.7 oz (50.2 kg) (10/12 0458)  Filed Weights   02/15/15 0600 02/16/15 0500 02/17/15 0458  Weight: 116 lb 13.5 oz (53 kg) 117 lb 4.6 oz (53.2 kg) 110 lb 10.7 oz (50.2 kg)    Weight change: -6 lb 9.8 oz (-3 kg)   Hemodynamic parameters for last 24 hours: CVP:  [10 mmHg-12 mmHg] 12 mmHg  Intake/Output from previous day: 10/11 0701 - 10/12 0700 In: 2416.8 [I.V.:811.8; NG/GT:1305; IV Piggyback:300] Out: 3995 [Urine:3245; Stool:750]  Intake/Output this shift: Total I/O In: 130.1 [I.V.:35.1; NG/GT:45; IV Piggyback:50] Out: 30 [Urine:30]  Current Meds: Scheduled Meds: . acetaminophen (TYLENOL) oral liquid 160 mg/5 mL  650 mg Oral Q6H  . antiseptic oral rinse  7 mL Mouth Rinse QID  . aspirin EC  325 mg Oral Daily   Or  . aspirin  324 mg Per Tube Daily  . cefTAZidime (FORTAZ)  IV  1 g Intravenous Q12H  . chlorhexidine gluconate  15 mL Mouth Rinse BID  . enoxaparin (LOVENOX) injection  40 mg Subcutaneous Q24H  . famotidine  20 mg Per Tube BID  . furosemide  40 mg Intravenous Q12H  . insulin aspart  0-24 Units Subcutaneous 6 times per day  . metoCLOPramide (REGLAN)  injection  10 mg Intravenous 4 times per day  . mupirocin ointment  1 application Topical BID  . potassium chloride  10 mEq Intravenous Q1 Hr x 3  . QUEtiapine  50 mg Oral QHS  . sodium chloride  10-40 mL Intracatheter Q12H  . sodium chloride  3 mL Intravenous Q12H  . vancomycin  500 mg Intravenous Q36H   Continuous Infusions: . sodium chloride 20 mL/hr at 02/16/15 0400  . dexmedetomidine 1.2 mcg/kg/hr (02/17/15 0800)  . feeding supplement (VITAL AF 1.2 CAL) 1,000 mL (02/16/15 1700)  . milrinone 0.25 mcg/kg/min (02/17/15 0800)   PRN Meds:.albuterol, haloperidol lactate, HYDROmorphone (DILAUDID) injection, metoprolol, ondansetron (ZOFRAN) IV, sodium chloride, sodium chloride  General appearance: alert and on vent with trach in place Heart: regular rate and rhythm Lungs: rhonchi bilaterally Abdomen: soft, non-tender; bowel sounds normal; no masses,  no organomegaly Extremities: edema trace Wound: clean and dry  Lab Results: CBC: Recent Labs  02/16/15 0445 02/17/15 0415  WBC 9.4 11.5*  HGB 9.7* 9.7*  HCT 29.9* 29.6*  PLT 524* 548*   BMET:  Recent Labs  02/16/15 0445 02/17/15 0415  NA 141 142  K 3.5 3.7  CL 107 109  CO2 25 25  GLUCOSE 166* 185*  BUN 58* 47*  CREATININE 1.09* 0.97  CALCIUM 8.3* 8.1*    PT/INR: No results for input(s): LABPROT, INR in the last 72 hours. Radiology: No results found.   Assessment/Plan: S/P Procedure(s) (LRB): STERNAL CLOSURE (N/A) TRANSESOPHAGEAL ECHOCARDIOGRAM (TEE) (N/A)  1. CV- hemodynamically stable on Milrinone, off Neo 2. Pulm- worsening oxygen levels this morning, vent settings increased, CXR with worsening pulmonary edema, aeration is improved 3. Renal- creatinine remains stable, hypervolemic continue lasix 4. Expected post operative blood loss anemia- Hgb stable 5. CBGs controlled 6. Dispo- patient with worsening oxygenation this morning, vent setting increased, continue diuresis, milrinone     [name],  [name] 02/17/2015 8:24 AM  CXR with new R base infiltrate- poss aspiration pneumonia Check  FT placement with KUB Check tracheal aspirate - cont antibiotics, change to Zosyn from [place]  patient examined and medical record reviewed,agree with above note. Kathlee Nationseter Van Trigt III 02/17/2015

## 2015-02-17 NOTE — Progress Notes (Signed)
02/17/2015 1130 Called to room, noted pt. Had accidentally pulled on panda tube. Out to 40 cm. Previously taped at 80cm. Advanced back 80 cm. Placement auscultated for temporary confirmation. Tube feeds stopped per protocol. STAT KUB obtained per protocol. Will continue to closely monitor patient.  Jazelyn Sipe, Blanchard KelchStephanie Ingold

## 2015-02-17 NOTE — Progress Notes (Signed)
[facility] BRIEF SICU PROGRESS NOTE  9 Days Post-Op  S/P Procedure(s) (LRB): STERNAL CLOSURE (N/A) TRANSESOPHAGEAL ECHOCARDIOGRAM (TEE) (N/A)   Stable day HR 120-130 BP stable O2 sats 98% on 50% FiO2 Repeat ABD x-ray verifies feeding tube tip in duodenum  Plan: Continue current plan  Purcell Nailslarence H Owen, MD 02/17/2015 9:54 PM

## 2015-02-18 ENCOUNTER — Inpatient Hospital Stay (HOSPITAL_COMMUNITY): Payer: Medicaid Other

## 2015-02-18 DIAGNOSIS — I5023 Acute on chronic systolic (congestive) heart failure: Secondary | ICD-10-CM | POA: Insufficient documentation

## 2015-02-18 DIAGNOSIS — J9621 Acute and chronic respiratory failure with hypoxia: Secondary | ICD-10-CM

## 2015-02-18 LAB — COMPREHENSIVE METABOLIC PANEL
ALT: 183 U/L — ABNORMAL HIGH (ref 14–54)
AST: 170 U/L — ABNORMAL HIGH (ref 15–41)
Albumin: 2.4 g/dL — ABNORMAL LOW (ref 3.5–5.0)
Alkaline Phosphatase: 184 U/L — ABNORMAL HIGH (ref 38–126)
Anion gap: 16 — ABNORMAL HIGH (ref 5–15)
BUN: 56 mg/dL — ABNORMAL HIGH (ref 6–20)
CO2: 20 mmol/L — ABNORMAL LOW (ref 22–32)
Calcium: 8.6 mg/dL — ABNORMAL LOW (ref 8.9–10.3)
Chloride: 111 mmol/L (ref 101–111)
Creatinine, Ser: 1.28 mg/dL — ABNORMAL HIGH (ref 0.44–1.00)
GFR calc Af Amer: 51 mL/min — ABNORMAL LOW (ref >60)
GFR calc non Af Amer: 44 mL/min — ABNORMAL LOW (ref >60)
Glucose, Bld: 129 mg/dL — ABNORMAL HIGH (ref 65–99)
Potassium: 3.3 mmol/L — ABNORMAL LOW (ref 3.5–5.1)
Sodium: 147 mmol/L — ABNORMAL HIGH (ref 135–145)
Total Bilirubin: 2.5 mg/dL — ABNORMAL HIGH (ref 0.3–1.2)
Total Protein: 7.2 g/dL (ref 6.5–8.1)

## 2015-02-18 LAB — BLOOD GAS, ARTERIAL
Acid-base deficit: 6.8 mmol/L — ABNORMAL HIGH (ref 0.0–2.0)
Acid-base deficit: 7.3 mmol/L — ABNORMAL HIGH (ref 0.0–2.0)
BICARBONATE: 16.8 meq/L — AB (ref 20.0–24.0)
Bicarbonate: 16 mEq/L — ABNORMAL LOW (ref 20.0–24.0)
Drawn by: 27407
Drawn by: 27407
FIO2: 0.5
FIO2: 0.5
LHR: 14 {breaths}/min
MECHVT: 370 mL
O2 Saturation: 93.6 %
O2 Saturation: 95.6 %
PEEP: 5 cmH2O
PEEP: 5 cmH2O
Patient temperature: 98.6
Patient temperature: 100.2
RATE: 14 resp/min
TCO2: 17.5 mmol/L (ref 0–100)
TCO2: 16.7 mmol/L (ref 0–100)
VT: 370 mL
pCO2 arterial: 25.9 mmHg — ABNORMAL LOW (ref 35.0–45.0)
pCO2 arterial: 24.3 mmHg — ABNORMAL LOW (ref 35.0–45.0)
pH, Arterial: 7.427 (ref 7.350–7.450)
pH, Arterial: 7.437 (ref 7.350–7.450)
pO2, Arterial: 72.5 mmHg — ABNORMAL LOW (ref 80.0–100.0)
pO2, Arterial: 82.7 mmHg (ref 80.0–100.0)

## 2015-02-18 LAB — POCT I-STAT, CHEM 8
BUN: 51 mg/dL — AB (ref 6–20)
BUN: 57 mg/dL — ABNORMAL HIGH (ref 6–20)
CALCIUM ION: 1.23 mmol/L (ref 1.13–1.30)
CHLORIDE: 109 mmol/L (ref 101–111)
CHLORIDE: 113 mmol/L — AB (ref 101–111)
CREATININE: 1.3 mg/dL — AB (ref 0.44–1.00)
Calcium, Ion: 0.97 mmol/L — ABNORMAL LOW (ref 1.13–1.30)
Creatinine, Ser: 1.5 mg/dL — ABNORMAL HIGH (ref 0.44–1.00)
Glucose, Bld: 259 mg/dL — ABNORMAL HIGH (ref 65–99)
Glucose, Bld: 92 mg/dL (ref 65–99)
HCT: 30 % — ABNORMAL LOW (ref 36.0–46.0)
HEMATOCRIT: 29 % — AB (ref 36.0–46.0)
HEMOGLOBIN: 10.2 g/dL — AB (ref 12.0–15.0)
Hemoglobin: 9.9 g/dL — ABNORMAL LOW (ref 12.0–15.0)
Potassium: 3.1 mmol/L — ABNORMAL LOW (ref 3.5–5.1)
Potassium: 3.5 mmol/L (ref 3.5–5.1)
SODIUM: 150 mmol/L — AB (ref 135–145)
Sodium: 146 mmol/L — ABNORMAL HIGH (ref 135–145)
TCO2: 19 mmol/L (ref 0–100)
TCO2: 20 mmol/L (ref 0–100)

## 2015-02-18 LAB — CARBOXYHEMOGLOBIN
Carboxyhemoglobin: 1.4 % (ref 0.5–1.5)
Carboxyhemoglobin: 1.7 % — ABNORMAL HIGH (ref 0.5–1.5)
Methemoglobin: 0.8 % (ref 0.0–1.5)
Methemoglobin: 0.7 % (ref 0.0–1.5)
O2 Saturation: 69.2 %
O2 Saturation: 77.6 %
Total hemoglobin: 9.9 g/dL — ABNORMAL LOW (ref 12.0–16.0)
Total hemoglobin: 8.3 g/dL — ABNORMAL LOW (ref 12.0–16.0)

## 2015-02-18 LAB — BLOOD GAS, VENOUS
Acid-base deficit: 4.4 mmol/L — ABNORMAL HIGH (ref 0.0–2.0)
Bicarbonate: 19.2 mEq/L — ABNORMAL LOW (ref 20.0–24.0)
FIO2: 0.5
MECHVT: 370 mL
O2 Saturation: 78.4 %
PEEP: 5 cmH2O
Patient temperature: 99
RATE: 14 resp/min
TCO2: 20.2 mmol/L (ref 0–100)
pCO2, Ven: 30.5 mmHg — ABNORMAL LOW (ref 45.0–50.0)
pH, Ven: 7.418 — ABNORMAL HIGH (ref 7.250–7.300)
pO2, Ven: 47.2 mmHg — ABNORMAL HIGH (ref 30.0–45.0)

## 2015-02-18 LAB — CBC
HCT: 29.9 % — ABNORMAL LOW (ref 36.0–46.0)
Hemoglobin: 9.7 g/dL — ABNORMAL LOW (ref 12.0–15.0)
MCH: 28.4 pg (ref 26.0–34.0)
MCHC: 32.4 g/dL (ref 30.0–36.0)
MCV: 87.7 fL (ref 78.0–100.0)
Platelets: 498 10*3/uL — ABNORMAL HIGH (ref 150–400)
RBC: 3.41 MIL/uL — ABNORMAL LOW (ref 3.87–5.11)
RDW: 18.3 % — ABNORMAL HIGH (ref 11.5–15.5)
WBC: 14.4 10*3/uL — ABNORMAL HIGH (ref 4.0–10.5)

## 2015-02-18 LAB — GLUCOSE, CAPILLARY
GLUCOSE-CAPILLARY: 193 mg/dL — AB (ref 65–99)
GLUCOSE-CAPILLARY: 152 mg/dL — AB (ref 65–99)
Glucose-Capillary: 80 mg/dL (ref 65–99)
Glucose-Capillary: 119 mg/dL — ABNORMAL HIGH (ref 65–99)
Glucose-Capillary: 140 mg/dL — ABNORMAL HIGH (ref 65–99)

## 2015-02-18 LAB — MAGNESIUM: MAGNESIUM: 1.9 mg/dL (ref 1.7–2.4)

## 2015-02-18 LAB — PHOSPHORUS: Phosphorus: 3.9 mg/dL (ref 2.5–4.6)

## 2015-02-18 MED ORDER — M.V.I. ADULT IV INJ
INTRAVENOUS | Status: AC
  Administered 2015-02-18: 17:00:00 via INTRAVENOUS
  Filled 2015-02-18: qty 960

## 2015-02-18 MED ORDER — ENOXAPARIN SODIUM 30 MG/0.3ML ~~LOC~~ SOLN
30.0000 mg | SUBCUTANEOUS | Status: DC
  Administered 2015-02-19 – 2015-02-23 (×5): 30 mg via SUBCUTANEOUS
  Filled 2015-02-18 (×7): qty 0.3

## 2015-02-18 MED ORDER — SODIUM BICARBONATE 8.4 % IV SOLN
INTRAVENOUS | Status: AC
  Filled 2015-02-18: qty 50

## 2015-02-18 MED ORDER — POTASSIUM CHLORIDE 10 MEQ/50ML IV SOLN
10.0000 meq | INTRAVENOUS | Status: AC
  Administered 2015-02-18 – 2015-02-19 (×4): 10 meq via INTRAVENOUS
  Filled 2015-02-18 (×3): qty 50

## 2015-02-18 MED ORDER — SODIUM CHLORIDE 0.9 % IV SOLN: INTRAVENOUS | Status: DC | PRN

## 2015-02-18 MED ORDER — FAT EMULSION 20 % IV EMUL
10.0000 mL | INTRAVENOUS | Status: AC
  Administered 2015-02-18: 10 mL via INTRAVENOUS
  Filled 2015-02-18 (×48): qty 100

## 2015-02-18 MED ORDER — FUROSEMIDE 10 MG/ML IJ SOLN: 80.0000 mg | Freq: Two times a day (BID) | INTRAMUSCULAR | Status: DC

## 2015-02-18 MED ORDER — FENTANYL BOLUS VIA INFUSION
50.0000 ug | INTRAVENOUS | Status: DC | PRN
  Administered 2015-02-19 – 2015-02-21 (×4): 50 ug via INTRAVENOUS
  Filled 2015-02-18: qty 50

## 2015-02-18 MED ORDER — SODIUM BICARBONATE 8.4 % IV SOLN
20.0000 meq | Freq: Once | INTRAVENOUS | Status: AC
  Administered 2015-02-18: 20 meq via INTRAVENOUS

## 2015-02-18 MED ORDER — HYDROMORPHONE HCL 1 MG/ML IJ SOLN: 0.5000 mg | INTRAMUSCULAR | Status: DC | PRN

## 2015-02-18 MED ORDER — POTASSIUM CHLORIDE 10 MEQ/50ML IV SOLN
10.0000 meq | INTRAVENOUS | Status: AC
  Administered 2015-02-18 (×3): 10 meq via INTRAVENOUS
  Filled 2015-02-18 (×3): qty 50

## 2015-02-18 MED ORDER — PANTOPRAZOLE SODIUM 40 MG IV SOLR
40.0000 mg | INTRAVENOUS | Status: DC
  Administered 2015-02-18 – 2015-02-19 (×2): 40 mg via INTRAVENOUS
  Filled 2015-02-18 (×3): qty 40

## 2015-02-18 MED ORDER — DEXTROSE 5 % IV SOLN
INTRAVENOUS | Status: DC
  Administered 2015-02-18: 1000 mL via INTRAVENOUS
  Administered 2015-02-21: 10 mL via INTRAVENOUS

## 2015-02-18 MED ORDER — POTASSIUM CHLORIDE 10 MEQ/50ML IV SOLN
INTRAVENOUS | Status: AC
  Administered 2015-02-18: 10 meq via INTRAVENOUS
  Filled 2015-02-18: qty 50

## 2015-02-18 MED ORDER — METOPROLOL TARTRATE 25 MG/10 ML ORAL SUSPENSION
25.0000 mg | Freq: Two times a day (BID) | ORAL | Status: DC
  Filled 2015-02-18: qty 10

## 2015-02-18 MED ORDER — FUROSEMIDE 10 MG/ML IJ SOLN
80.0000 mg | Freq: Three times a day (TID) | INTRAMUSCULAR | Status: DC
  Administered 2015-02-18 – 2015-02-19 (×4): 80 mg via INTRAVENOUS
  Filled 2015-02-18 (×5): qty 8

## 2015-02-18 MED ORDER — MIDAZOLAM HCL 2 MG/2ML IJ SOLN
2.0000 mg | INTRAMUSCULAR | Status: DC | PRN
  Administered 2015-02-19: 2 mg via INTRAVENOUS
  Filled 2015-02-18 (×2): qty 2

## 2015-02-18 MED ORDER — MAGNESIUM SULFATE 2 GM/50ML IV SOLN
2.0000 g | Freq: Once | INTRAVENOUS | Status: AC
  Administered 2015-02-18: 2 g via INTRAVENOUS
  Filled 2015-02-18: qty 50

## 2015-02-18 MED ORDER — MIDAZOLAM HCL 2 MG/2ML IJ SOLN
2.0000 mg | INTRAMUSCULAR | Status: DC | PRN
  Administered 2015-02-18 – 2015-02-19 (×2): 1 mg via INTRAVENOUS
  Filled 2015-02-18 (×2): qty 2

## 2015-02-18 MED ORDER — CARVEDILOL 3.125 MG PO TABS
3.1250 mg | ORAL_TABLET | Freq: Two times a day (BID) | ORAL | Status: DC
  Filled 2015-02-18 (×3): qty 1

## 2015-02-18 MED ORDER — METOPROLOL TARTRATE 1 MG/ML IV SOLN
2.5000 mg | INTRAVENOUS | Status: AC | PRN
  Administered 2015-02-18 – 2015-02-19 (×2): 2.5 mg via INTRAVENOUS
  Filled 2015-02-18 (×2): qty 5

## 2015-02-18 MED ORDER — FENTANYL CITRATE (PF) 100 MCG/2ML IJ SOLN
50.0000 ug | Freq: Once | INTRAMUSCULAR | Status: AC
  Administered 2015-02-18: 50 ug via INTRAVENOUS
  Filled 2015-02-18: qty 2

## 2015-02-18 MED ORDER — POTASSIUM CHLORIDE 10 MEQ/50ML IV SOLN
10.0000 meq | INTRAVENOUS | Status: AC
  Administered 2015-02-18 (×3): 10 meq via INTRAVENOUS
  Filled 2015-02-18: qty 50

## 2015-02-18 MED ORDER — SODIUM CHLORIDE 0.9 % IV SOLN
25.0000 ug/h | INTRAVENOUS | Status: DC
  Administered 2015-02-18: 50 ug/h via INTRAVENOUS
  Administered 2015-02-19 – 2015-02-20 (×3): 300 ug/h via INTRAVENOUS
  Administered 2015-02-20 – 2015-02-21 (×2): 250 ug/h via INTRAVENOUS
  Administered 2015-02-22 (×2): 300 ug/h via INTRAVENOUS
  Administered 2015-02-23 (×2): 350 ug/h via INTRAVENOUS
  Administered 2015-02-23: 300 ug/h via INTRAVENOUS
  Administered 2015-02-24 – 2015-02-25 (×2): 50 ug/h via INTRAVENOUS
  Filled 2015-02-18 (×14): qty 50

## 2015-02-18 MED ORDER — TRAVASOL 10 % IV SOLN: INTRAVENOUS | Status: DC

## 2015-02-18 NOTE — Progress Notes (Signed)
2 RT's attempted twice to place a radial aline and were unsuccessful.  RN made aware  RT will continue to monitor.

## 2015-02-18 NOTE — Progress Notes (Signed)
Advanced Heart Failure Rounding Note   Subjective:    S/p CABG with Centrimag placement 02/01/15  Centrimag explanted 9/30.   Sternal Closure 10/3.    10/7 Extubated, reintubated 2 hours later.   Awake on vent, breathing over it currently. Responds to voice, spontaneous eye opening.On milrinone 0.25. CVP 16-17Co-ox 69.2%.  Cr trending up 0.97 -> 1.00 -> 1.28  Bed weights stable at 110 past several days.   Objective:   Weight Range:  Vital Signs:   Temp:  [97.7 F (36.5 C)-100.6 F (38.1 C)] 100.6 F (38.1 C) (10/13 0845) Pulse Rate:  [69-139] 120 (10/13 0845) Resp:  [18-42] 36 (10/13 0845) BP: (74-167)/(35-87) 130/57 mmHg (10/13 0830) SpO2:  [83 %-100 %] 83 % (10/13 0845) FiO2 (%):  [40 %-50 %] 50 % (10/13 0829) Weight:  [110 lb 7.2 oz (50.1 kg)] 110 lb 7.2 oz (50.1 kg) (10/13 0445) Last BM Date: 02/17/15  Weight change: Filed Weights   02/16/15 0500 02/17/15 0458 02/18/15 0445  Weight: 117 lb 4.6 oz (53.2 kg) 110 lb 10.7 oz (50.2 kg) 110 lb 7.2 oz (50.1 kg)    Intake/Output:   Intake/Output Summary (Last 24 hours) at 02/18/15 0957 Last data filed at 02/18/15 0620  Gross per 24 hour  Intake 1391.35 ml  Output   1640 ml  Net -248.65 ml     Physical Exam:  CVP 16-17 General:  ont rach. Awake. HEENT: normal x ETT. Neck: supple. Dried blood on Trach. Cor: Chest with dressing. Tachy regular Lungs: CTA Abdomen: soft, nontender, nondistended. Hypoactive bowel sounds. Extremities: no cyanosis, clubbing, rash,  Trace to 1+ LE edema in thighs. SCDs in place. Neuro: awake on vent    Telemetry: Sinus tach 130  Labs: Basic Metabolic Panel:  Recent Labs Lab 02/12/15 0304  02/14/15 0409 02/15/15 0416 02/16/15 0445 02/17/15 0415 02/17/15 1654 02/18/15 0450  NA 135  < > 137 139 141 142 147* 147*  K 3.8  < > 3.5 4.0 3.5 3.7 4.3 3.3*  CL 93*  < > 96* 104 107 109 112* 111  CO2 30  < > --  20*  GLUCOSE 157*  < > 221* 163* 166* 185* 209* 129*    BUN 47*  < > 65* 65* 58* 47* 56* 56*  CREATININE 1.31*  < > 1.56* 1.29* 1.09* 0.97 1.00 1.28*  CALCIUM 8.4*  < > 8.3* 8.5* 8.3* 8.1*  --  8.6*  MG 1.8  --  2.0 2.1  --   --   --  1.9  PHOS 3.8  --  4.9* 4.6  --   --   --  3.9  < > = values in this interval not displayed.  Liver Function Tests:  Recent Labs Lab 02/14/15 0409 02/15/15 0416 02/16/15 0445 02/17/15 0415 02/18/15 0450  AST 48* 37 38 39 170*  ALT 96* 82* 69* 69* 183*  ALKPHOS 201* 210* 173* 187* 184*  BILITOT 2.4* 2.2* 2.0* 1.9* 2.5*  PROT 6.0* 5.8* 5.9* 6.5 7.2  ALBUMIN 2.3* 2.2* 2.2* 2.4* 2.4*   No results for input(s): LIPASE, AMYLASE in the last 168 hours. No results for input(s): AMMONIA in the last 168 hours.  CBC:  Recent Labs Lab 02/14/15 0409 02/15/15 0416 02/16/15 0445 02/17/15 0415 02/17/15 1654 02/18/15 0450  WBC 11.0* 13.1* 9.4 11.5*  --  14.4*  NEUTROABS  --  9.1*  --   --   --   --  HGB 7.1* 10.6* 9.7* 9.7* 11.2* 9.7*  HCT 21.0* 31.6* 29.9* 29.6* 33.0* 29.9*  MCV 89.4 85.4 85.4 87.3  --  87.7  PLT 432* 523* 524* 548*  --  498*    Cardiac Enzymes: No results for input(s): CKTOTAL, CKMB, CKMBINDEX, TROPONINI in the last 168 hours.  BNP: BNP (last 3 results)  Recent Labs  07/10/14 1315  BNP 1059.1*    ProBNP (last 3 results) No results for input(s): PROBNP in the last 8760 hours.    Other results:  Imaging: Dg Abd 1 View  02/17/2015  CLINICAL DATA:  Feeding tube placement. EXAM: ABDOMEN - 1 VIEW COMPARISON:  02/17/2015 at 9:23 a.m. FINDINGS: The curvature of the feeding tube suggests that its tip is in the distal third part of the duodenum. Epicardial pacer leads observed. Left basilar airspace opacity. Prior CABG. Bowel gas pattern unremarkable. IMPRESSION: 1. Curvature the feeding tube suggests that its tip is in the distal third part of the duodenum. 2. Left basilar airspace opacity. Electronically Signed   By: Gaylyn RongWalter  Liebkemann M.D.   On: 02/17/2015 12:25   Dg Chest  Port 1 View  02/18/2015  ADDENDUM REPORT: 02/18/2015 09:12 ADDENDUM: The nurse for this patient, Vernona RiegerLaura, called me and asked about the dictated report from 02/18/2015. An error obviously was made in the dictation, possibly having viewed another patient's chest x-ray at that time. The current dictation is as follows: Compared to the chest x-ray of 02/17/2015, there has been worsening of airspace disease in the right upper and mid lung field suspicious for pneumonia. Also there appears to be edema present with cardiomegaly and possible effusions, indicative of CHF. Tracheostomy, left central venous line, and feeding tube remain. IMPRESSION: Worsening of airspace disease throughout the right lung most consistent with pneumonia. Probable superimposed CHF as well. Electronically Signed   By: Dwyane DeePaul  Barry M.D.   On: 02/18/2015 09:12  02/18/2015  EXAM: PORTABLE CHEST 1 VIEW COMPARISON:  None. FINDINGS: The heart size and mediastinal contours are within normal limits. Both lungs are clear. The visualized skeletal structures are unremarkable. IMPRESSION: No active disease. Electronically Signed: By: Dwyane DeePaul  Barry M.D. On: 02/18/2015 07:58   Dg Chest Port 1 View  02/17/2015  CLINICAL DATA:  New onset shortness of breath.  Weakness. EXAM: PORTABLE CHEST 1 VIEW COMPARISON:  One day prior FINDINGS: Tracheostomy appropriately positioned. Feeding tube extends beyond the inferior aspect of the film. Left-sided PICC line terminates at the low SVC. Prior median sternotomy. Cardiomegaly accentuated by AP portable technique. Layering bilateral pleural effusions. No pneumothorax. Interstitial edema is moderate and slightly increased. Lower lobe predominant airspace disease is worse on the right. IMPRESSION: Slight worsening aeration. Increased congestive heart failure with layering bilateral pleural effusions. Progressive bibasilar airspace disease which could represent atelectasis or infection/ aspiration. Electronically Signed    By: Jeronimo GreavesKyle  Talbot M.D.   On: 02/17/2015 08:34   Dg Abd Portable 1v  02/17/2015  CLINICAL DATA:  Feeding tube placement EXAM: PORTABLE ABDOMEN - 1 VIEW COMPARISON:  Study obtained earlier in the day FINDINGS: Feeding tube tip is at or just beyond the ligament of Treitz. Bowel gas pattern unremarkable. Pacemaker lead wires are attached to the right heart. IMPRESSION: Feeding tube tip at or just beyond the ligament of Treitz. Bowel gas pattern unremarkable. Electronically Signed   By: Bretta BangWilliam  Woodruff III M.D.   On: 02/17/2015 21:34   Dg Abd Portable 1v  02/17/2015  CLINICAL DATA:  Encounter for feeding tube placement. EXAM: PORTABLE ABDOMEN -  1 VIEW COMPARISON:  February 10, 2015. FINDINGS: The bowel gas pattern is normal. Distal tip of feeding tube remains in the expected position of jejunum. IMPRESSION: No evidence of bowel obstruction or ileus. Distal tip of feeding tube remains in the expected position of jejunum. Electronically Signed   By: Lupita Raider, M.D.   On: 02/17/2015 09:40     Medications:     Scheduled Medications: . acetaminophen (TYLENOL) oral liquid 160 mg/5 mL  650 mg Oral Q6H  . ALPRAZolam  0.5 mg Oral Daily  . antiseptic oral rinse  7 mL Mouth Rinse QID  . aspirin EC  325 mg Oral Daily   Or  . aspirin  324 mg Per Tube Daily  . carvedilol  3.125 mg Oral BID WC  . chlorhexidine gluconate  15 mL Mouth Rinse BID  . enoxaparin (LOVENOX) injection  40 mg Subcutaneous Q24H  . fentaNYL (SUBLIMAZE) injection  50 mcg Intravenous Once  . furosemide  40 mg Intravenous Q12H  . insulin aspart  0-24 Units Subcutaneous 6 times per day  . levalbuterol  1.25 mg Nebulization 4 times per day  . magnesium sulfate 1 - 4 g bolus IVPB  2 g Intravenous Once  . mupirocin ointment  1 application Topical BID  . piperacillin-tazobactam (ZOSYN)  IV  3.375 g Intravenous Q8H  . QUEtiapine  50 mg Oral QHS  . sodium chloride  10-40 mL Intracatheter Q12H  . sodium chloride  3 mL Intravenous Q12H   . vancomycin  500 mg Intravenous Q36H    Infusions: . dextrose 1,000 mL (02/18/15 0857)  . Marland KitchenTPN (CLINIMIX-E) Adult     And  . fat emulsion    . feeding supplement (VITAL AF 1.2 CAL) 1,000 mL (02/17/15 1300)  . fentaNYL infusion INTRAVENOUS    . milrinone 0.25 mcg/kg/min (02/18/15 0801)    PRN Medications: albuterol, fentaNYL, haloperidol lactate, midazolam, midazolam, ondansetron (ZOFRAN) IV, sodium chloride, sodium chloride   Assessment:   1. Cardiogenic shock   --s/p CABG with centrimag placement 02/01/15. Centrimag removed 9/30 2. CAD s/p CABG 3. 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 Acute respiratory failure 5. Acute blood loss anemia 6. Thrombocytopenia: HIT negative by SRA 7. Hyponatremia 8. Hypokalemia  Plan/Discussion:     Will increase lasix to 80 mg IV TID and hold coreg. CVP 15.   ABG C02 art 25.9, p02 art 72.5, pH art 7.427  CCM to stop precadex and use fentanyl gtt with prn versed. Currently breathing over vent.   HR may be related to volume overload, hope will improve with adequate diuresis. ? Related to aspiration pneumonia.  Graciella Freer, PA-C 9:57 AM   Advanced Heart Failure Team Pager [phone] (M-F; 7a - 4p)  Please contact [facility] for night-coverage after hours (4p -7a ) and weekends on [email or web address]   Patient seen and examined with Otilio Saber, PA-C. We discussed all aspects of the encounter. I agree with the assessment and plan as stated above.   Improving but respiratory status still tenuous. Will increase lasix and see how she responds. Watch renal function closely.   Maniah Nading,MD 5:34 PM

## 2015-02-18 NOTE — Progress Notes (Signed)
Nutrition Consult/Brief Note  RD consulted for Methyl Blue Dye diagnostic test.  This practice is not supported by the American Association of Critical-Care Nurses (AACN) and FDA.  Dye should not be added to enteral feeding as a method for identifying aspiration of gastric contents.  Discussed with Dr. Marchelle Gearingamaswamy with Pulmonary/Critical Care Medicine.  Maureen ChattersKatie Talmage Teaster, RD, LDN Pager #: [phone] After-Hours Pager #: [phone]

## 2015-02-18 NOTE — Progress Notes (Signed)
10/13/2016[name]- Trach team respiratory care consult.  Pt currently on full support of 370/14/50%/5peep.  Pt with increased respiratory rate and unable to tolerate wean at this time.  Trach care supplies at bedside.  Will cont to follow pt progress.

## 2015-02-18 NOTE — Progress Notes (Signed)
eLink Physician-Brief Progress Note Patient Name: Breanna QuarryMaria T Alvarez DOB: [birth year] MRN: [id number]   Date of Service  02/18/2015  HPI/Events of Note  Appears to have st with pacs - had been on BB at home  eICU Interventions  metaprolol 25 mg bid per ft     Intervention Category Major Interventions: Arrhythmia - evaluation and management  Sandrea HughsMichael Raul Winterhalter 02/18/2015, 9:10 PM

## 2015-02-18 NOTE — Progress Notes (Signed)
[facility] DAILY ICU PROGRESS NOTE                   [address]            [place] [address]          [phone]   10 Days Post-Op Procedure(s) (LRB): STERNAL CLOSURE (N/A) TRANSESOPHAGEAL ECHOCARDIOGRAM (TEE) (N/A)  Total Length of Stay:  LOS: 23 days   Subjective:  Ms. Hinde continues to have worsening respiratory issues.  She was leaking suspected tube feedings around her tracheostomy overnight, concerning for aspiration.  Objective: Vital signs in last 24 hours: Temp:  [97.3 F (36.3 C)-100.1 F (37.8 C)] 99.1 F (37.3 C) (10/13 0645) Pulse Rate:  [69-139] 124 (10/13 0645) Cardiac Rhythm:  [-] Sinus tachycardia (10/13 0400) Resp:  [18-42] 32 (10/13 0645) BP: (74-167)/(35-87) 153/71 mmHg (10/13 0645) SpO2:  [93 %-100 %] 100 % (10/13 0645) FiO2 (%):  [40 %-50 %] 40 % (10/13 0433) Weight:  [110 lb 7.2 oz (50.1 kg)] 110 lb 7.2 oz (50.1 kg) (10/13 0445)  Filed Weights   02/16/15 0500 02/17/15 0458 02/18/15 0445  Weight: 117 lb 4.6 oz (53.2 kg) 110 lb 10.7 oz (50.2 kg) 110 lb 7.2 oz (50.1 kg)    Weight change: -3.5 oz (-0.1 kg)   Hemodynamic parameters for last 24 hours: CVP:  [16 mmHg-20 mmHg] 16 mmHg  Intake/Output from previous day: 10/12 0701 - 10/13 0700 In: 1651.2 [I.V.:594.1; NG/GT:657.1; IV Piggyback:400] Out: 1720 [Urine:1420; Stool:300]  Current Meds: Scheduled Meds: . acetaminophen (TYLENOL) oral liquid 160 mg/5 mL  650 mg Oral Q6H  . ALPRAZolam  0.5 mg Oral Daily  . antiseptic oral rinse  7 mL Mouth Rinse QID  . aspirin EC  325 mg Oral Daily   Or  . aspirin  324 mg Per Tube Daily  . carvedilol  3.125 mg Oral BID WC  . chlorhexidine gluconate  15 mL Mouth Rinse BID  . enoxaparin (LOVENOX) injection  40 mg Subcutaneous Q24H  . famotidine  20 mg Per Tube BID  . furosemide  40 mg Intravenous Q12H  . insulin aspart  0-24 Units Subcutaneous 6 times per day  . levalbuterol  1.25 mg Nebulization 4 times per day  . multivitamin  5 mL  Oral Daily  . mupirocin ointment  1 application Topical BID  . piperacillin-tazobactam (ZOSYN)  IV  3.375 g Intravenous Q8H  . potassium chloride  10 mEq Intravenous Q1 Hr x 3  . QUEtiapine  50 mg Oral QHS  . sodium chloride  10-40 mL Intracatheter Q12H  . sodium chloride  3 mL Intravenous Q12H  . vancomycin  500 mg Intravenous Q36H   Continuous Infusions: . ADULT TPN    . dexmedetomidine Stopped (02/17/15 1315)  . dextrose    . feeding supplement (VITAL AF 1.2 CAL) 1,000 mL (02/17/15 1300)  . milrinone 0.25 mcg/kg/min (02/18/15 0801)  . phenylephrine (NEO-SYNEPHRINE) Adult infusion Stopped (02/17/15 1430)   PRN Meds:.albuterol, haloperidol lactate, HYDROmorphone (DILAUDID) injection, midazolam, ondansetron (ZOFRAN) IV, sodium chloride, sodium chloride  General appearance: alert, intubated on trach Heart: regular rate and rhythm Lungs: diminished breath sounds bibasilar Abdomen: soft, non-tender; bowel sounds normal; no masses,  no organomegaly Extremities: edema trace Wound: clean and dry  Lab Results: CBC: Recent Labs  02/17/15 0415 02/17/15 1654 02/18/15 0450  WBC 11.5*  --  14.4*  HGB 9.7* 11.2* 9.7*  HCT 29.6* 33.0* 29.9*  PLT 548*  --  498*  BMET:  Recent Labs  02/17/15 0415 02/17/15 1654 02/18/15 0450  NA 142 147* 147*  K 3.7 4.3 3.3*  CL 109 112* 111  CO2 25  --  20*  GLUCOSE 185* 209* 129*  BUN 47* 56* 56*  CREATININE 0.97 1.00 1.28*  CALCIUM 8.1*  --  8.6*    PT/INR: No results for input(s): LABPROT, INR in the last 72 hours. Radiology: Dg Abd 1 View  02/17/2015  CLINICAL DATA:  Feeding tube placement. EXAM: ABDOMEN - 1 VIEW COMPARISON:  02/17/2015 at 9:23 a.m. FINDINGS: The curvature of the feeding tube suggests that its tip is in the distal third part of the duodenum. Epicardial pacer leads observed. Left basilar airspace opacity. Prior CABG. Bowel gas pattern unremarkable. IMPRESSION: 1. Curvature the feeding tube suggests that its tip is in the  distal third part of the duodenum. 2. Left basilar airspace opacity. Electronically Signed   By: Gaylyn Rong M.D.   On: 02/17/2015 12:25   Dg Chest Port 1 View  02/18/2015  EXAM: PORTABLE CHEST 1 VIEW COMPARISON:  None. FINDINGS: The heart size and mediastinal contours are within normal limits. Both lungs are clear. The visualized skeletal structures are unremarkable. IMPRESSION: No active disease. Electronically Signed   By: Dwyane Dee M.D.   On: 02/18/2015 07:58   Dg Chest Port 1 View  02/17/2015  CLINICAL DATA:  New onset shortness of breath.  Weakness. EXAM: PORTABLE CHEST 1 VIEW COMPARISON:  One day prior FINDINGS: Tracheostomy appropriately positioned. Feeding tube extends beyond the inferior aspect of the film. Left-sided PICC line terminates at the low SVC. Prior median sternotomy. Cardiomegaly accentuated by AP portable technique. Layering bilateral pleural effusions. No pneumothorax. Interstitial edema is moderate and slightly increased. Lower lobe predominant airspace disease is worse on the right. IMPRESSION: Slight worsening aeration. Increased congestive heart failure with layering bilateral pleural effusions. Progressive bibasilar airspace disease which could represent atelectasis or infection/ aspiration. Electronically Signed   By: Jeronimo Greaves M.D.   On: 02/17/2015 08:34   Dg Abd Portable 1v  02/17/2015  CLINICAL DATA:  Feeding tube placement EXAM: PORTABLE ABDOMEN - 1 VIEW COMPARISON:  Study obtained earlier in the day FINDINGS: Feeding tube tip is at or just beyond the ligament of Treitz. Bowel gas pattern unremarkable. Pacemaker lead wires are attached to the right heart. IMPRESSION: Feeding tube tip at or just beyond the ligament of Treitz. Bowel gas pattern unremarkable. Electronically Signed   By: Bretta Bang III M.D.   On: 02/17/2015 21:34   Dg Abd Portable 1v  02/17/2015  CLINICAL DATA:  Encounter for feeding tube placement. EXAM: PORTABLE ABDOMEN - 1 VIEW  COMPARISON:  February 10, 2015. FINDINGS: The bowel gas pattern is normal. Distal tip of feeding tube remains in the expected position of jejunum. IMPRESSION: No evidence of bowel obstruction or ileus. Distal tip of feeding tube remains in the expected position of jejunum. Electronically Signed   By: Lupita Raider, M.D.   On: 02/17/2015 09:40     Assessment/Plan: S/P Procedure(s) (LRB): STERNAL CLOSURE (N/A) TRANSESOPHAGEAL ECHOCARDIOGRAM (TEE) (N/A)  1. CV- developed hypotension overnight, NEO restarted, remains on Milrinone 2. Pulm- worsening infiltrates on CXR, pulmonary edema- on trach, not able to wean- will get ABG 3. Renal- remains hypervolemic, hypokalemic- supplement potassium, diuresis 4. ID- suspected aspiration pneumonia- continue Vanc, Fortaz switched to Zosyn yesterday 5. Anemia- stable 6. Sugars remain stable 7. Dispo- patient with declining respiratory status, suspected aspiration pneumonia on broad spectrum ABX,  continue diuresis, supplement potassium, vent per CCM     Maanav Kassabian 02/18/2015 8:01 AM

## 2015-02-18 NOTE — Progress Notes (Signed)
10 Days Post-Op Procedure(s) (LRB): STERNAL CLOSURE (N/A) TRANSESOPHAGEAL ECHOCARDIOGRAM (TEE) (N/A) Subjective: CXR with worsening R side airspace disease clinical evidence of aspiration of tube feeds - tube is post pyloric on xray DC tube feeds and resume TNA Sputum culture pending  Objective: Vital signs in last 24 hours: Temp:  [97.3 F (36.3 C)-100.1 F (37.8 C)] 99.1 F (37.3 C) (10/13 0645) Pulse Rate:  [69-139] 124 (10/13 0645) Cardiac Rhythm:  [-] Sinus tachycardia (10/13 0400) Resp:  [18-42] 32 (10/13 0645) BP: (74-167)/(35-87) 153/71 mmHg (10/13 0645) SpO2:  [85 %-100 %] 100 % (10/13 0645) FiO2 (%):  [30 %-50 %] 40 % (10/13 0433) Weight:  [110 lb 7.2 oz (50.1 kg)] 110 lb 7.2 oz (50.1 kg) (10/13 0445)  Hemodynamic parameters for last 24 hours: CVP:  [16 mmHg-20 mmHg] 16 mmHg  Intake/Output from previous day: 10/12 0701 - 10/13 0700 In: 1651.2 [I.V.:594.1; NG/GT:657.1; IV Piggyback:400] Out: 1720 [Urine:1420; Stool:300] Intake/Output this shift:    Coarse breath sounds  Lab Results:  Recent Labs  02/17/15 0415 02/17/15 1654 02/18/15 0450  WBC 11.5*  --  14.4*  HGB 9.7* 11.2* 9.7*  HCT 29.6* 33.0* 29.9*  PLT 548*  --  498*   BMET:  Recent Labs  02/17/15 0415 02/17/15 1654 02/18/15 0450  NA 142 147* 147*  K 3.7 4.3 3.3*  CL 109 112* 111  CO2 25  --  20*  GLUCOSE 185* 209* 129*  BUN 47* 56* 56*  CREATININE 0.97 1.00 1.28*  CALCIUM 8.1*  --  8.6*    PT/INR: No results for input(s): LABPROT, INR in the last 72 hours. ABG    Component Value Date/Time   PHART 7.411 02/15/2015 0430   HCO3 22.6 02/15/2015 0430   TCO2 20 02/17/2015 1654   ACIDBASEDEF 1.4 02/15/2015 0430   O2SAT 69.2 02/18/2015 0500   CBG (last 3)   Recent Labs  02/17/15 1912 02/17/15 2341 02/18/15 0348  GLUCAP 129* 157* 80    Assessment/Plan: S/P Procedure(s) (LRB): STERNAL CLOSURE (N/A) TRANSESOPHAGEAL ECHOCARDIOGRAM (TEE) (N/A) elevated sodium- start D5W until TNA  starts Hold tube feeds for prob aspiration Place ng tube for  Gastric decompression   LOS: 23 days      Kathlee Nationseter Van Trigt III 02/18/2015

## 2015-02-18 NOTE — Progress Notes (Signed)
PULMONARY / CRITICAL CARE MEDICINE   Name: Breanna Alvarez MRN: [id number] DOB: [birth year]    ADMISSION DATE:  01/26/2015 CONSULTATION DATE:  10/5  REFERRING MD :  Donata Clay   CHIEF COMPLAINT:  Vent management   INITIAL PRESENTATION: 63yo female never smoker with hx HTN, CAD previously refused CABG ultimately admitted 9/20 for CVTS eval and CABG after ongoing chest pain.  She underwent 4V CABG 9/26 and has had complicated course with cardiogenic shock s/p centrimag placement and removal, ischemic cardiomyopathy with EF 20%, open sternum now s/p sternal closure 10/3.  She remains intubated since initial CABG 9/26 with poor weaning and PCCM now consulted to assist.   STUDIES:  9/21 2D echo>> EF 20-25%, mod MR, PA press 51 mmHg 9/30 TEE>>> EF 30-35%, hypokinesis of apical myocardium, akinesis of anteroseptal myocardium, mild TR, improved LV function    SIGNIFICANT EVENTS: 9/26 CABG x 4, placement of centrimag L ventricular assistive device  9/30 removal centrimag  10/3 sternal closure 02/14/15:   Hg drop overnight by 1 gm and transfusion ordered. Hypotension overnight with increase in precedex.  02/06/15- s/p trach - Tyson Alias  10//11/16 - Doing well post trach  . On milrinone and precedex. Off neo. Weaning but deconditioned (hx as gathered from bedside RN and d/w Dr Laneta Simmers). TMax 32F. CXR worse  02/17/15 - d/w RN - uanble to wean precedex due to agitation. On milrinone and precedex. Very deconditioned.    SUBJECTIVE/OVERNIGHT/INTERVAL HX; 02/18/2015 : worsening resp mechanics fio2 50%, RR 40 and dysnchrony with worsening cxr. Concern for aspiration of tube feeds overnight. Being made NPO with LIS and startting tPN. Still on precedex and neo. Tracheal aspirate pending . Cardiac interatcing meds right now -> milrinone, precedex, coreg, lasix    VITAL SIGNS: Temp:  [97.7 F (36.5 C)-100.6 F (38.1 C)] 100.6 F (38.1 C) (10/13 0845) Pulse Rate:  [69-139] 120 (10/13 0845) Resp:   [18-42] 36 (10/13 0845) BP: (74-167)/(35-87) 130/57 mmHg (10/13 0830) SpO2:  [83 %-100 %] 83 % (10/13 0845) FiO2 (%):  [40 %-50 %] 50 % (10/13 0829) Weight:  [50.1 kg (110 lb 7.2 oz)] 50.1 kg (110 lb 7.2 oz) (10/13 0445)   HEMODYNAMICS: CVP:  [16 mmHg-20 mmHg] 19 mmHg   VENTILATOR SETTINGS: Vent Mode:  [-] PRVC FiO2 (%):  [40 %-50 %] 50 % Set Rate:  [14 bmp] 14 bmp Vt Set:  [370 mL] 370 mL PEEP:  [5 cmH20] 5 cmH20 Plateau Pressure:  [30 cmH20-32 cmH20] 30 cmH20  INTAKE / OUTPUT:  Intake/Output Summary (Last 24 hours) at 02/18/15 0925 Last data filed at 02/18/15 0620  Gross per 24 hour  Intake 1391.35 ml  Output   1640 ml  Net -248.65 ml   PHYSICAL EXAMINATION: General:  Acute on chronic ill appearing female, VERY  DECONDITIONED Neuro: RASS +1 on precedex gtt HEENT:  Moist mucus membranes, ETT. Cardiovascular:  S1, S2, RRR, Lungs:  Clear, no Wheeze or crackles. Abdomen:  Round, soft, non tender, +bs  Musculoskeletal:  Warm and dry, generalized edema   LABS: PULMONARY  Recent Labs Lab 02/12/15 0528  02/12/15 1555  02/14/15 0415  02/15/15 0430 02/16/15 0500 02/17/15 0414 02/17/15 1654 02/18/15 0500 02/18/15 0840  PHART 7.452*  --  7.430  --  7.400  --  7.411  --   --   --   --  7.427  PCO2ART 39.4  --  42.4  --  41.4  --  36.4  --   --   --   --  25.9*  PO2ART 112*  --  102*  --  136*  --  88.2  --   --   --   --  72.5*  HCO3 27.1*  --  27.7*  --  25.0*  --  22.6  --   --   --   --  16.8*  TCO2 28.3  --  29.0  --  26.3  --  23.7  --   --  20  --  17.5  O2SAT 98.6  < > 97.9  < > 98.7  < > 96.7 77.7 82.9  --  69.2 93.6  < > = values in this interval not displayed.  CBC  Recent Labs Lab 02/16/15 0445 02/17/15 0415 02/17/15 1654 02/18/15 0450  HGB 9.7* 9.7* 11.2* 9.7*  HCT 29.9* 29.6* 33.0* 29.9*  WBC 9.4 11.5*  --  14.4*  PLT 524* 548*  --  498*    COAGULATION  Recent Labs Lab 02/12/15 1010 02/13/15 0430  INR 1.56* 1.44    CARDIAC  No results  for input(s): TROPONINI in the last 168 hours. No results for input(s): PROBNP in the last 168 hours.   CHEMISTRY  Recent Labs Lab 02/12/15 0304  02/14/15 0409 02/15/15 0416 02/16/15 0445 02/17/15 0415 02/17/15 1654 02/18/15 0450  NA 135  < > 137 139 141 142 147* 147*  K 3.8  < > 3.5 4.0 3.5 3.7 4.3 3.3*  CL 93*  < > 96* 104 107 109 112* 111  CO2 30  < > --  20*  GLUCOSE 157*  < > 221* 163* 166* 185* 209* 129*  BUN 47*  < > 65* 65* 58* 47* 56* 56*  CREATININE 1.31*  < > 1.56* 1.29* 1.09* 0.97 1.00 1.28*  CALCIUM 8.4*  < > 8.3* 8.5* 8.3* 8.1*  --  8.6*  MG 1.8  --  2.0 2.1  --   --   --  1.9  PHOS 3.8  --  4.9* 4.6  --   --   --  3.9  < > = values in this interval not displayed. Estimated Creatinine Clearance: 32.7 mL/min (by C-G formula based on Cr of 1.28).   LIVER  Recent Labs Lab 02/12/15 1010 02/13/15 0430 02/14/15 0409 02/15/15 0416 02/16/15 0445 02/17/15 0415 02/18/15 0450  AST  --  95* 48* 37 38 39 170*  ALT  --  149* 96* 82* 69* 69* 183*  ALKPHOS  --  278* 201* 210* 173* 187* 184*  BILITOT  --  3.1* 2.4* 2.2* 2.0* 1.9* 2.5*  PROT  --  5.9* 6.0* 5.8* 5.9* 6.5 7.2  ALBUMIN  --  2.2* 2.3* 2.2* 2.2* 2.4* 2.4*  INR 1.56* 1.44  --   --   --   --   --      INFECTIOUS No results for input(s): LATICACIDVEN, PROCALCITON in the last 168 hours.   ENDOCRINE CBG (last 3)   Recent Labs  02/17/15 2341 02/18/15 0348 02/18/15 0828  GLUCAP 157* 80 119*         IMAGING x48h  - image(s) personally visualized  -   highlighted in bold Dg Abd 1 View  02/17/2015  CLINICAL DATA:  Feeding tube placement. EXAM: ABDOMEN - 1 VIEW COMPARISON:  02/17/2015 at 9:23 a.m. FINDINGS: The curvature of the feeding tube suggests that its tip is in the distal third part of the duodenum. Epicardial pacer leads observed. Left basilar airspace opacity. Prior CABG.  Bowel gas pattern unremarkable. IMPRESSION: 1. Curvature the feeding tube suggests that its tip is in the  distal third part of the duodenum. 2. Left basilar airspace opacity. Electronically Signed   By: Gaylyn RongWalter  Liebkemann M.D.   On: 02/17/2015 12:25   Dg Chest Port 1 View  02/18/2015  ADDENDUM REPORT: 02/18/2015 09:12 ADDENDUM: The nurse for this patient, Vernona RiegerLaura, called me and asked about the dictated report from 02/18/2015. An error obviously was made in the dictation, possibly having viewed another patient's chest x-ray at that time. The current dictation is as follows: Compared to the chest x-ray of 02/17/2015, there has been worsening of airspace disease in the right upper and mid lung field suspicious for pneumonia. Also there appears to be edema present with cardiomegaly and possible effusions, indicative of CHF. Tracheostomy, left central venous line, and feeding tube remain. IMPRESSION: Worsening of airspace disease throughout the right lung most consistent with pneumonia. Probable superimposed CHF as well. Electronically Signed   By: Dwyane DeePaul  Barry M.D.   On: 02/18/2015 09:12  02/18/2015  EXAM: PORTABLE CHEST 1 VIEW COMPARISON:  None. FINDINGS: The heart size and mediastinal contours are within normal limits. Both lungs are clear. The visualized skeletal structures are unremarkable. IMPRESSION: No active disease. Electronically Signed: By: Dwyane DeePaul  Barry M.D. On: 02/18/2015 07:58   Dg Chest Port 1 View  02/17/2015  CLINICAL DATA:  New onset shortness of breath.  Weakness. EXAM: PORTABLE CHEST 1 VIEW COMPARISON:  One day prior FINDINGS: Tracheostomy appropriately positioned. Feeding tube extends beyond the inferior aspect of the film. Left-sided PICC line terminates at the low SVC. Prior median sternotomy. Cardiomegaly accentuated by AP portable technique. Layering bilateral pleural effusions. No pneumothorax. Interstitial edema is moderate and slightly increased. Lower lobe predominant airspace disease is worse on the right. IMPRESSION: Slight worsening aeration. Increased congestive heart failure with  layering bilateral pleural effusions. Progressive bibasilar airspace disease which could represent atelectasis or infection/ aspiration. Electronically Signed   By: Jeronimo GreavesKyle  Talbot M.D.   On: 02/17/2015 08:34   Dg Abd Portable 1v  02/17/2015  CLINICAL DATA:  Feeding tube placement EXAM: PORTABLE ABDOMEN - 1 VIEW COMPARISON:  Study obtained earlier in the day FINDINGS: Feeding tube tip is at or just beyond the ligament of Treitz. Bowel gas pattern unremarkable. Pacemaker lead wires are attached to the right heart. IMPRESSION: Feeding tube tip at or just beyond the ligament of Treitz. Bowel gas pattern unremarkable. Electronically Signed   By: Bretta BangWilliam  Woodruff III M.D.   On: 02/17/2015 21:34   Dg Abd Portable 1v  02/17/2015  CLINICAL DATA:  Encounter for feeding tube placement. EXAM: PORTABLE ABDOMEN - 1 VIEW COMPARISON:  February 10, 2015. FINDINGS: The bowel gas pattern is normal. Distal tip of feeding tube remains in the expected position of jejunum. IMPRESSION: No evidence of bowel obstruction or ileus. Distal tip of feeding tube remains in the expected position of jejunum. Electronically Signed   By: Lupita RaiderJames  Green Jr, M.D.   On: 02/17/2015 09:40        ASSESSMENT / PLAN:  PULMONARY OETT 9/26>>>10/7>>>10/7 > 10/10/'16 TRACH by Tyson AliasFeinstein Acute on Chronic respiratory failure - multifactorial in setting heart failure, cardiogenic shock and severe deconditioning post multiple surgeries    - failure to wean,s/p trach . Currently deconditioning and pulmonary infitlrates and agitation preventing good wean. Worsened 50% and pulmonary infiltrates on 02/18/2015 - possible tube feed aspiration  P:   Trach care Full vent support Continuous sedation   CARDIOVASCULAR CVL  R IJ cordis 9/26>>>10/2 LUA PICC >>>  CAD s/p CABG  Acute systolic heart failure  Ischemic cardiomyopathy  Cardiogenic shock   - on milrinone,  precedex sheduled + coreg scheduled + lasix per MAR  P:  Dc precedex Recall  cards - help with optimization of meds   RENAL    Hyponatremia  AKI - mild but worsening 10/7 with diuresis Hypokalemia   - creat worsening - Mag < 2gm% - K < 4 and repleted  P:   Replete mag F/u chem  KVO IVF Diuresis per crds  GASTROINTESTINAL  Recent Labs Lab 02/12/15 1010 02/13/15 0430 02/14/15 0409 02/15/15 0416 02/16/15 0445 02/17/15 0415 02/18/15 0450  AST  --  95* 48* 37 38 39 170*  ALT  --  149* 96* 82* 69* 69* 183*  ALKPHOS  --  278* 201* 210* 173* 187* 184*  BILITOT  --  3.1* 2.4* 2.2* 2.0* 1.9* 2.5*  PROT  --  5.9* 6.0* 5.8* 5.9* 6.5 7.2  ALBUMIN  --  2.2* 2.3* 2.2* 2.2* 2.4* 2.4*  INR 1.56* 1.44  --   --   --   --   --     Protein calorie malnutrition  Transaminitis, ? Etiology. At risk shock liver. Not currently on statin or other offending meds  - tube feeds possibly aspirated. New onset transaminitis  P:   Check LFT prn Holding TF per nutrition and CVTS.  PPI  HEMATOLOGIC Thrombocytopenia - improved  Anemia - mild P:  F/u CBC Lovenox    INFECTIOUS Wound (Sternum) 10/3>>>NTD Sputum 10/4>>> NTD Open sternum > closed   resp culture  02/17/15 Blood culture 02/18/15 >>   02/18/15 - Temp 101F - new . Increased pulm infitlrate . Possible aspiration 02/17/15 v VAP  P:   Vancomycin 10/5>>> Ceftaz 10/6>> 10/12, Zosyn 10/12 >>   ENDOCRINE Hyperglycemia  P:   SSI  NEUROLOGIC Agitated . WOrening vent dysnchrony 02/18/15   P:   DC precedex due to resp demand Start fent gtt Versed prn -> if needed do gtt RASS goal: -2 Seroquel qhs. Haldol prn Dc Dilaudid prn    FAMILY  - Updates:  Dr Tyson Alias consented family for trach 02/15/15. None at bedside 02/17/2015, 02/18/15    The patient is critically ill with multiple organ systems failure and requires high complexity decision making for assessment and support, frequent evaluation and titration of therapies, application of advanced monitoring technologies and extensive  interpretation of multiple databases.   Critical Care Time devoted to patient care services described in this note is  35  Minutes. This time reflects time of care of this signee Dr Kalman Shan. This critical care time does not reflect procedure time, or teaching time or supervisory time of PA/NP/Med student/Med Resident etc but could involve care discussion time    Dr. Kalman Shan, M.D., [facility].C.P Pulmonary and Critical Care Medicine Staff Physician [facility] [place] [facility] Pager: [phone], If no answer or between  15:00h - 7:00h: call [phone]  02/18/2015 9:45 AM

## 2015-02-18 NOTE — Plan of Care (Signed)
Problem: Phase II - Intermediate Post-Op Goal: Wean to Extubate Outcome: Not Progressing Attempting to wean. See previous note  Problem: Phase I Progression Outcomes Goal: Patient tolerating weaning plan Outcome: Not Progressing Pt has been unable to wean due to secretions and anxiety.

## 2015-02-18 NOTE — Progress Notes (Signed)
Pt had run of SVT, Elink notified and present via camera, pt reconverted to sinus tach with a rate of 125-130 bpm. BP stable throughout. MD to place orders. Will continue to closely monitor. Modena JanskyKevin Elsye Mccollister RN [address] [facility]

## 2015-02-18 NOTE — Progress Notes (Signed)
PARENTERAL NUTRITION CONSULT NOTE  Pharmacy Consult for TPN Indication: Prolonged ileus  Allergies  Allergen Reactions  . Atorvastatin Other (See Comments)    Gas, chest tightness  . Crestor [Rosuvastatin Calcium] Other (See Comments)    Muscle aches   Patient Measurements: Height: 5' (152.4 cm) Weight: 110 lb 7.2 oz (50.1 kg) IBW/kg (Calculated) : 45.5  Usual Weight: ~100-106 pounds (46-48 kg)  Vital Signs: Temp: 99.1 F (37.3 C) (10/13 0645) Temp Source: Core (Comment) (10/13 0400) BP: 153/71 mmHg (10/13 0645) Pulse Rate: 124 (10/13 0645) Intake/Output from previous day: 10/12 0701 - 10/13 0700 In: 1651.2 [I.V.:594.1; NG/GT:657.1; IV Piggyback:400] Out: 1720 [Urine:1420; Stool:300] Intake/Output from this shift:   Labs:  Recent Labs  02/16/15 0445 02/17/15 0415 02/17/15 1654 02/18/15 0450  WBC 9.4 11.5*  --  14.4*  HGB 9.7* 9.7* 11.2* 9.7*  HCT 29.9* 29.6* 33.0* 29.9*  PLT 524* 548*  --  498*    Recent Labs  02/16/15 0445 02/17/15 0415 02/17/15 1654 02/18/15 0450  NA 141 142 147* 147*  K 3.5 3.7 4.3 3.3*  CL 107 109 112* 111  CO2 25 25  --  20*  GLUCOSE 166* 185* 209* 129*  BUN 58* 47* 56* 56*  CREATININE 1.09* 0.97 1.00 1.28*  CALCIUM 8.3* 8.1*  --  8.6*  MG  --   --   --  1.9  PHOS  --   --   --  3.9  PROT 5.9* 6.5  --  7.2  ALBUMIN 2.2* 2.4*  --  2.4*  AST 38 39  --  170*  ALT 69* 69*  --  183*  ALKPHOS 173* 187*  --  184*  BILITOT 2.0* 1.9*  --  2.5*   Estimated Creatinine Clearance: 32.7 mL/min (by C-G formula based on Cr of 1.28).   Recent Labs  02/17/15 1912 02/17/15 2341 02/18/15 0348  GLUCAP 129* 157* 80   Medical History: Past Medical History  Diagnosis Date  . Hyperlipidemia   . Acute systolic CHF (congestive heart failure), NYHA class 3 (HCC) 07/10/2014  . Hypertension    Medications:  Infusions:  . dexmedetomidine Stopped (02/17/15 1315)  . dextrose    . feeding supplement (VITAL AF 1.2 CAL) 1,000 mL (02/17/15  1300)  . milrinone 0.25 mcg/kg/min (02/18/15 0801)  . phenylephrine (NEO-SYNEPHRINE) Adult infusion Stopped (02/17/15 1430)   Admit:  64 yo female with cardiac problems and recent CABG on 9/26 and Centrimag removal on 9/30 with an open sternum and plans for return to OR on Monday.  She was in her usual state of health prior to admit with a weight range of 100-105 pounds.  She has some fluid gain and her weight on admission is ~133 pounds.  Surgeries/Procedures: S/p CABG x4, TEE, Removal of CVAD  Insulin Requirements in the past 24 hours:  Novolog 24 units  Current Nutrition:  Vital AF 1.2 @ 45 ml/hr  Nutritional Goals:  per RD recs on 10/6 1277 kCal 75-85 g protein  GI: Post-op ileus, NG tube 700 ml out yesterday. Albumin low at 2.4. Prealbumin low at 6.6 10/3). Last BM was 10/12. Previously on TPN 10/1 > 10/6, was transitioned to TF, these were turned off this morning due to possible aspiration.   Endo: cbgs 60-209 on ssi, pt previously req'd 20-40 units of insulin in TPN  Lytes: K 4.3 > 3.3, Mg 1.9, Phos 3.9, CoCa 9.8, Ca x Phos < 55, Na 147 - starting D5 @ 50  Renal:  SCr 1 > 1.28, eCrCl 30-35 ml/min, UOP 1.2  Pulm: s/p trach 10/10, difficult time weaning from vent, MD notes worsening cxr  Cards: S/p CABG x 4 - placement of CAVD (and removed). SHF. S/p sternum closure 10/3. BP ok HR tachy. Milrinone, lasix, asa, coreg, neo (off)  Hepatobil: LFts were wnl, spiked over night. Previously elevated 1 week ago. AST 39 > 170, ALT 69 >183, Tbili 1.9 > 2.5  Neuro: Precedex turned back on this morning. On seroquel, xanax scheduled (?)   GCS 13, RASS 2 (goal 0)  ID: Abx for possible aspiration pneumonia, wbc 11.5 > 14.4, Tm 100.6 vanc 9/26 > 9/30, 10/3 >> ceftaz 10/6 > 10/12 Zosyn 10/12 >>  Best Practices: Lovenox  TPN Access: PICC  TPN start date: 10/1 > 10/6,  10/13 >   Plan:  E 5/15 at 40 ml/hr and IVFA 20% at 10 ml/hr which will provide 48 g protein and 1162 kcal Pt was  previously on TPN and IVFA were held for 5 days, will go ahead and resume now Stop multivitamin, will add to TPN Stop Pepcid, will add to TPN Add Trace elements on MWF KVO D5 when TPN starts Add insulin regular 10 units to TPN TG in am    Agapito Games, PharmD, BCPS Clinical Pharmacist Pager: [phone] 02/18/2015 8:41 AM

## 2015-02-18 NOTE — Progress Notes (Signed)
Dr. Morton PetersVan Tright notified of run of SVT, increased ectopy (PAC's and PVC's) as well as increased HR (125-140bpm), and I-stat potassium of 3.5. Orders received for prn lopressor q4hr for HR>125 and 4 runs of K. Will continue to closely monitor.  Modena JanskyKevin Keyli Duross RN 2 [facility]

## 2015-02-18 NOTE — Progress Notes (Signed)
CT surgery p.m. Rounds  Status post tracheostomy earlier this week Now with evidence of aspiration pneumonia and worsening pulmonary status She appears to be breathing more comfortably this evening on IV fentanyl, off Precedex She has had a mild metabolic acidosis which required 1 amp of bicarbonate today Since the patient will be a prolonged vent wean with aspiration pneumonia a left brachial A-line was placed to obtain arterial blood gas determinations. TNA has started She is diuresing well Continue current care

## 2015-02-19 ENCOUNTER — Other Ambulatory Visit (HOSPITAL_COMMUNITY): Payer: Medicaid Other

## 2015-02-19 ENCOUNTER — Inpatient Hospital Stay (HOSPITAL_COMMUNITY): Payer: Medicaid Other

## 2015-02-19 LAB — COMPREHENSIVE METABOLIC PANEL
ALT: 167 U/L — ABNORMAL HIGH (ref 14–54)
AST: 100 U/L — ABNORMAL HIGH (ref 15–41)
Albumin: 2.4 g/dL — ABNORMAL LOW (ref 3.5–5.0)
Alkaline Phosphatase: 155 U/L — ABNORMAL HIGH (ref 38–126)
Anion gap: 11 (ref 5–15)
BUN: 67 mg/dL — ABNORMAL HIGH (ref 6–20)
CO2: 22 mmol/L (ref 22–32)
Calcium: 8.7 mg/dL — ABNORMAL LOW (ref 8.9–10.3)
Chloride: 111 mmol/L (ref 101–111)
Creatinine, Ser: 1.54 mg/dL — ABNORMAL HIGH (ref 0.44–1.00)
GFR calc Af Amer: 41 mL/min — ABNORMAL LOW (ref >60)
GFR calc non Af Amer: 35 mL/min — ABNORMAL LOW (ref >60)
Glucose, Bld: 122 mg/dL — ABNORMAL HIGH (ref 65–99)
Potassium: 3.9 mmol/L (ref 3.5–5.1)
Sodium: 144 mmol/L (ref 135–145)
Total Bilirubin: 2.2 mg/dL — ABNORMAL HIGH (ref 0.3–1.2)
Total Protein: 7 g/dL (ref 6.5–8.1)

## 2015-02-19 LAB — CBC
HCT: 27.9 % — ABNORMAL LOW (ref 36.0–46.0)
Hemoglobin: 9 g/dL — ABNORMAL LOW (ref 12.0–15.0)
MCH: 28.4 pg (ref 26.0–34.0)
MCHC: 32.3 g/dL (ref 30.0–36.0)
MCV: 88 fL (ref 78.0–100.0)
Platelets: 459 10*3/uL — ABNORMAL HIGH (ref 150–400)
RBC: 3.17 MIL/uL — ABNORMAL LOW (ref 3.87–5.11)
RDW: 18.3 % — ABNORMAL HIGH (ref 11.5–15.5)
WBC: 15.9 10*3/uL — ABNORMAL HIGH (ref 4.0–10.5)

## 2015-02-19 LAB — POCT I-STAT, CHEM 8
BUN: 65 mg/dL — AB (ref 6–20)
CREATININE: 1.7 mg/dL — AB (ref 0.44–1.00)
Calcium, Ion: 1.21 mmol/L (ref 1.13–1.30)
Chloride: 112 mmol/L — ABNORMAL HIGH (ref 101–111)
GLUCOSE: 134 mg/dL — AB (ref 65–99)
HEMATOCRIT: 30 % — AB (ref 36.0–46.0)
Hemoglobin: 10.2 g/dL — ABNORMAL LOW (ref 12.0–15.0)
POTASSIUM: 4 mmol/L (ref 3.5–5.1)
Sodium: 148 mmol/L — ABNORMAL HIGH (ref 135–145)
TCO2: 21 mmol/L (ref 0–100)

## 2015-02-19 LAB — CULTURE, RESPIRATORY W GRAM STAIN
Culture: NO GROWTH
Special Requests: NORMAL

## 2015-02-19 LAB — BLOOD GAS, ARTERIAL
Acid-base deficit: 2.3 mmol/L — ABNORMAL HIGH (ref 0.0–2.0)
Bicarbonate: 21.9 mEq/L (ref 20.0–24.0)
Drawn by: 23588
FIO2: 0.4
MECHVT: 370 mL
O2 Saturation: 96.5 %
PEEP: 5 cmH2O
Patient temperature: 99.8
RATE: 14 resp/min
TCO2: 23 mmol/L (ref 0–100)
pCO2 arterial: 38.1 mmHg (ref 35.0–45.0)
pH, Arterial: 7.381 (ref 7.350–7.450)
pO2, Arterial: 91.1 mmHg (ref 80.0–100.0)

## 2015-02-19 LAB — GLUCOSE, CAPILLARY
GLUCOSE-CAPILLARY: 84 mg/dL (ref 65–99)
Glucose-Capillary: 120 mg/dL — ABNORMAL HIGH (ref 65–99)
Glucose-Capillary: 95 mg/dL (ref 65–99)
Glucose-Capillary: 115 mg/dL — ABNORMAL HIGH (ref 65–99)
Glucose-Capillary: 111 mg/dL — ABNORMAL HIGH (ref 65–99)

## 2015-02-19 LAB — MAGNESIUM: Magnesium: 2.4 mg/dL (ref 1.7–2.4)

## 2015-02-19 LAB — POCT I-STAT 3, ART BLOOD GAS (G3+)
Acid-base deficit: 2 mmol/L (ref 0.0–2.0)
BICARBONATE: 22.7 meq/L (ref 20.0–24.0)
O2 SAT: 95 %
PCO2 ART: 36.9 mmHg (ref 35.0–45.0)
PH ART: 7.402 (ref 7.350–7.450)
Patient temperature: 100.2
TCO2: 24 mmol/L (ref 0–100)
pO2, Arterial: 79 mmHg — ABNORMAL LOW (ref 80.0–100.0)

## 2015-02-19 LAB — CARBOXYHEMOGLOBIN
Carboxyhemoglobin: 1.8 % — ABNORMAL HIGH (ref 0.5–1.5)
Methemoglobin: 0.9 % (ref 0.0–1.5)
O2 Saturation: 74.1 %
Total hemoglobin: 7.3 g/dL — ABNORMAL LOW (ref 12.0–16.0)

## 2015-02-19 LAB — TRIGLYCERIDES: Triglycerides: 125 mg/dL (ref <150)

## 2015-02-19 LAB — PHOSPHORUS: Phosphorus: 4.5 mg/dL (ref 2.5–4.6)

## 2015-02-19 MED ORDER — HALOPERIDOL LACTATE 5 MG/ML IJ SOLN
5.0000 mg | INTRAMUSCULAR | Status: AC
  Administered 2015-02-19: 5 mg via INTRAVENOUS
  Filled 2015-02-19: qty 1

## 2015-02-19 MED ORDER — HALOPERIDOL LACTATE 5 MG/ML IJ SOLN
5.0000 mg | Freq: Four times a day (QID) | INTRAMUSCULAR | Status: AC
  Administered 2015-02-19 – 2015-02-20 (×3): 5 mg via INTRAVENOUS
  Filled 2015-02-19 (×3): qty 1

## 2015-02-19 MED ORDER — METOPROLOL TARTRATE 12.5 MG HALF TABLET
12.5000 mg | ORAL_TABLET | Freq: Two times a day (BID) | ORAL | Status: DC
  Administered 2015-02-19 (×2): 12.5 mg via ORAL
  Filled 2015-02-19 (×4): qty 1

## 2015-02-19 MED ORDER — FUROSEMIDE 10 MG/ML IJ SOLN
80.0000 mg | Freq: Two times a day (BID) | INTRAMUSCULAR | Status: DC
  Administered 2015-02-20 – 2015-02-21 (×3): 80 mg via INTRAVENOUS
  Filled 2015-02-19 (×4): qty 8

## 2015-02-19 MED ORDER — TRACE MINERALS CR-CU-MN-SE-ZN 10-1000-500-60 MCG/ML IV SOLN
INTRAVENOUS | Status: AC
  Administered 2015-02-19: 18:00:00 via INTRAVENOUS
  Filled 2015-02-19: qty 1800

## 2015-02-19 MED ORDER — MAGIC MOUTHWASH
10.0000 mL | Freq: Three times a day (TID) | ORAL | Status: DC | PRN
  Administered 2015-02-19 – 2015-02-23 (×6): 10 mL via ORAL
  Filled 2015-02-19 (×9): qty 10

## 2015-02-19 MED ORDER — POTASSIUM CHLORIDE 20 MEQ/15ML (10%) PO SOLN
20.0000 meq | Freq: Once | ORAL | Status: AC
  Administered 2015-02-19: 20 meq
  Filled 2015-02-19: qty 15

## 2015-02-19 MED ORDER — FAT EMULSION 20 % IV EMUL
240.0000 mL | INTRAVENOUS | Status: AC
  Administered 2015-02-19: 240 mL via INTRAVENOUS
  Filled 2015-02-19: qty 250

## 2015-02-19 MED ORDER — NOREPINEPHRINE BITARTRATE 1 MG/ML IV SOLN
0.0000 ug/min | INTRAVENOUS | Status: DC
  Administered 2015-02-19: 2 ug/min via INTRAVENOUS
  Administered 2015-02-20: 8 ug/min via INTRAVENOUS
  Administered 2015-02-20: 14 ug/min via INTRAVENOUS
  Filled 2015-02-19 (×3): qty 4

## 2015-02-19 NOTE — Progress Notes (Signed)
Advanced Heart Failure Rounding Note   Subjective:    S/p CABG with Centrimag placement 02/01/15  Centrimag explanted 9/30.   Sternal Closure 10/3.    10/7 Extubated, reintubated 2 hours later.   Awake on vent (trach)  but agitated at times. BP soft due to sedation. Remains on lasix 80 IV tid. Good urine output yesterday but CVP still 16.     Objective:   Weight Range:  Vital Signs:   Temp:  [98.8 F (37.1 C)-100.8 F (38.2 C)] 99.5 F (37.5 C) (10/14 1300) Pulse Rate:  [43-140] 103 (10/14 1300) Resp:  [14-46] 24 (10/14 1300) BP: (98-153)/(45-93) 98/45 mmHg (10/14 1300) SpO2:  [95 %-100 %] 100 % (10/14 1300) Arterial Line BP: (87-172)/(45-77) 87/45 mmHg (10/14 1300) FiO2 (%):  [40 %-50 %] 40 % (10/14 1204) Weight:  [48.3 kg (106 lb 7.7 oz)] 48.3 kg (106 lb 7.7 oz) (10/14 0500) Last BM Date: 02/18/15  Weight change: Filed Weights   02/17/15 0458 02/18/15 0445 02/19/15 0500  Weight: 50.2 kg (110 lb 10.7 oz) 50.1 kg (110 lb 7.2 oz) 48.3 kg (106 lb 7.7 oz)    Intake/Output:   Intake/Output Summary (Last 24 hours) at 02/19/15 1322 Last data filed at 02/19/15 1300  Gross per 24 hour  Intake 2720.18 ml  Output   2905 ml  Net -184.82 ml     Physical Exam:  CVP 16 General:  On t ach. Awake. HEENT: normal x ETT. Neck: supple. + trach Cor: Chest with dressing. Tachy regular Lungs: CTA Abdomen: soft, nontender, nondistended. Hypoactive bowel sounds. Extremities: no cyanosis, clubbing, rash,  1-2+ LE edema in thighs. SCDs in place. Neuro: awake on vent    Telemetry: Sinus tach 130  Labs: Basic Metabolic Panel:  Recent Labs Lab 02/14/15 0409 02/15/15 0416 02/16/15 0445 02/17/15 0415 02/17/15 1654 02/18/15 0450 02/18/15 1646 02/18/15 2224 02/19/15 0525  NA 137 139 141 142 147* 147* 146* 150* 144  K 3.5 4.0 3.5 3.7 4.3 3.3* 3.1* 3.5 3.9  CL 96* 104 107 109 112* 111 109 113* 111  CO2 --  20*  --   --  22  GLUCOSE 221* 163* 166* 185*  209* 129* 259* 92 122*  BUN 65* 65* 58* 47* 56* 56* 51* 57* 67*  CREATININE 1.56* 1.29* 1.09* 0.97 1.00 1.28* 1.30* 1.50* 1.54*  CALCIUM 8.3* 8.5* 8.3* 8.1*  --  8.6*  --   --  8.7*  MG 2.0 2.1  --   --   --  1.9  --   --  2.4  PHOS 4.9* 4.6  --   --   --  3.9  --   --  4.5    Liver Function Tests:  Recent Labs Lab 02/15/15 0416 02/16/15 0445 02/17/15 0415 02/18/15 0450 02/19/15 0525  AST 37 38 39 170* 100*  ALT 82* 69* 69* 183* 167*  ALKPHOS 210* 173* 187* 184* 155*  BILITOT 2.2* 2.0* 1.9* 2.5* 2.2*  PROT 5.8* 5.9* 6.5 7.2 7.0  ALBUMIN 2.2* 2.2* 2.4* 2.4* 2.4*   No results for input(s): LIPASE, AMYLASE in the last 168 hours. No results for input(s): AMMONIA in the last 168 hours.  CBC:  Recent Labs Lab 02/15/15 0416 02/16/15 0445 02/17/15 0415 02/17/15 1654 02/18/15 0450 02/18/15 1646 02/18/15 2224 02/19/15 0525  WBC 13.1* 9.4 11.5*  --  14.4*  --   --  15.9*  NEUTROABS 9.1*  --   --   --   --   --   --   --  HGB 10.6* 9.7* 9.7* 11.2* 9.7* 9.9* 10.2* 9.0*  HCT 31.6* 29.9* 29.6* 33.0* 29.9* 29.0* 30.0* 27.9*  MCV 85.4 85.4 87.3  --  87.7  --   --  88.0  PLT 523* 524* 548*  --  498*  --   --  459*    Cardiac Enzymes: No results for input(s): CKTOTAL, CKMB, CKMBINDEX, TROPONINI in the last 168 hours.  BNP: BNP (last 3 results)  Recent Labs  07/10/14 1315  BNP 1059.1*    ProBNP (last 3 results) No results for input(s): PROBNP in the last 8760 hours.    Other results:  Imaging: Dg Chest Port 1 View  02/19/2015  CLINICAL DATA:  Mitral valve replacement, shortness of breath EXAM: PORTABLE CHEST 1 VIEW COMPARISON:  02/18/2015 FINDINGS: Endotracheal tube is 4.8 cm above the carina. NG tube and feeding tube extend into the stomach. Postop changes from median sternotomy. Stable cardiomegaly with slight improvement in the asymmetric bilateral airspace disease, but still worse in the right upper lobe. No enlarging effusion or pneumothorax. Left PICC line tip  in the lower SVC. IMPRESSION: Minimal improvement in asymmetric patchy bilateral airspace process compatible with bilateral pneumonia versus asymmetric alveolar edema. No enlarging effusion or pneumothorax Stable cardiomegaly Electronically Signed   By: Judie Petit.  Shick M.D.   On: 02/19/2015 08:04   Dg Chest Port 1 View  02/18/2015  ADDENDUM REPORT: 02/18/2015 09:12 ADDENDUM: The nurse for this patient, Vernona Rieger, called me and asked about the dictated report from 02/18/2015. An error obviously was made in the dictation, possibly having viewed another patient's chest x-ray at that time. The current dictation is as follows: Compared to the chest x-ray of 02/17/2015, there has been worsening of airspace disease in the right upper and mid lung field suspicious for pneumonia. Also there appears to be edema present with cardiomegaly and possible effusions, indicative of CHF. Tracheostomy, left central venous line, and feeding tube remain. IMPRESSION: Worsening of airspace disease throughout the right lung most consistent with pneumonia. Probable superimposed CHF as well. Electronically Signed   By: Dwyane Dee M.D.   On: 02/18/2015 09:12  02/18/2015  EXAM: PORTABLE CHEST 1 VIEW COMPARISON:  None. FINDINGS: The heart size and mediastinal contours are within normal limits. Both lungs are clear. The visualized skeletal structures are unremarkable. IMPRESSION: No active disease. Electronically Signed: By: Dwyane Dee M.D. On: 02/18/2015 07:58   Dg Abd Portable 1v  02/18/2015  CLINICAL DATA:  Feeding tube placement. EXAM: PORTABLE ABDOMEN - 1 VIEW this COMPARISON:  Abdominal radiograph 02/17/2015 FINDINGS: Enteric tube tip and side-port project over the stomach. Additionally there is a feeding tube with the tip projecting the expected location of the fourth portion of the duodenum. Unremarkable bowel gas pattern. Heterogeneous opacities bilateral lung bases. Lumbar spine degenerative changes. Surgical staple line overlying  chest. IMPRESSION: NG tube tip and side-port project over the stomach. Feeding tube tip stable in position projecting at the fourth portion of the duodenum. Electronically Signed   By: Annia Belt M.D.   On: 02/18/2015 10:36   Dg Abd Portable 1v  02/17/2015  CLINICAL DATA:  Feeding tube placement EXAM: PORTABLE ABDOMEN - 1 VIEW COMPARISON:  Study obtained earlier in the day FINDINGS: Feeding tube tip is at or just beyond the ligament of Treitz. Bowel gas pattern unremarkable. Pacemaker lead wires are attached to the right heart. IMPRESSION: Feeding tube tip at or just beyond the ligament of Treitz. Bowel gas pattern unremarkable. Electronically Signed   By: Chrissie Noa  Margarita GrizzleWoodruff III M.D.   On: 02/17/2015 21:34     Medications:     Scheduled Medications: . antiseptic oral rinse  7 mL Mouth Rinse QID  . aspirin EC  325 mg Oral Daily   Or  . aspirin  324 mg Per Tube Daily  . chlorhexidine gluconate  15 mL Mouth Rinse BID  . enoxaparin (LOVENOX) injection  30 mg Subcutaneous Q24H  . furosemide  80 mg Intravenous TID  . haloperidol lactate  5 mg Intravenous Q6H  . insulin aspart  0-24 Units Subcutaneous 6 times per day  . levalbuterol  1.25 mg Nebulization 4 times per day  . metoprolol tartrate  12.5 mg Oral BID  . mupirocin ointment  1 application Topical BID  . pantoprazole (PROTONIX) IV  40 mg Intravenous Q24H  . piperacillin-tazobactam (ZOSYN)  IV  3.375 g Intravenous Q8H  . QUEtiapine  50 mg Oral QHS  . sodium chloride  10-40 mL Intracatheter Q12H  . sodium chloride  3 mL Intravenous Q12H  . vancomycin  500 mg Intravenous Q36H    Infusions: . dextrose 10 mL/hr at 02/19/15 0700  . Marland Kitchen.TPN (CLINIMIX-E) Adult 40 mL/hr at 02/19/15 0700   And  . fat emulsion 10 mL (02/19/15 0600)  . Marland Kitchen.TPN (CLINIMIX-E) Adult     And  . fat emulsion    . fentaNYL infusion INTRAVENOUS 300 mcg/hr (02/19/15 0928)  . milrinone 0.25 mcg/kg/min (02/19/15 0800)  . norepinephrine (LEVOPHED) Adult infusion       PRN Medications: Place/Maintain arterial line **AND** sodium chloride, albuterol, fentaNYL, metoprolol, midazolam, midazolam, ondansetron (ZOFRAN) IV, sodium chloride, sodium chloride   Assessment:   1. Cardiogenic shock   --s/p CABG with centrimag placement 02/01/15. Centrimag removed 9/30 2. CAD s/p CABG 3. 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 Acute respiratory failure 5. Acute blood loss anemia 6. Thrombocytopenia: HIT negative by SRA 7. Hyponatremia 8. Hypokalemia  Plan/Discussion:     Remains quite tenuous with limited progress. Volume status remains elevated. Will continue IV diuresis as renal function and BP tolerate.    Lafayette Dunlevy,MD 1:22 PM

## 2015-02-19 NOTE — Significant Event (Signed)
SBP maintaining in the low 80's; decreased fentanyl gtt and pt remains agitated.  Notified Dr. Marchelle Gearingamaswamy, orders received for levophed gtt and to take fentanyl gtt back to previous dose.  Will continue to monitor.

## 2015-02-19 NOTE — Progress Notes (Signed)
Nutrition Consult/Follow Up  DOCUMENTATION CODES:   Not applicable  INTERVENTION:    TPN per pharmacy  NUTRITION DIAGNOSIS:   Inadequate oral intake related to inability to eat as evidenced by NPO status, ongoing  GOAL:   Patient will meet greater than or equal to 90% of their needs, progressing  MONITOR:   Vent status, Labs, Weight trends, Skin, I & O's (TPN prescription)  ASSESSMENT:   64 yo female never smoker with hx HTN, CAD previously refused CABG ultimately admitted 9/20 for CVTS eval and CABG after ongoing chest pain. She underwent 4V CABG 9/26 and has had complicated course with cardiogenic shock s/p centrimag placement and removal, ischemic cardiomyopathy with EF 20%, open sternum now s/p sternal closure 10/3. She remains intubated since initial CABG 9/26 with poor weaning and PCCM now consulted to assist.   Patient s/p procedure 9/26: CORONARY ARTERY BYPASS GRAFTING x 4 PLACEMENT OF CENTRIMAG VENTRICULAR ASSIST DEVICE  Patient is currently on ventilator support via trach -- NGT to LIS MV: 10.5 L/min Temp (24hrs), Avg:99.7 F (37.6 C), Min:98.8 F (37.1 C), Max:100.8 F (38.2 C)   RD consulted for new TPN.    Pt previously receiving Vital AF 1.2 formula via Panda tube (remains in place).  Per chart review, pt was aspirating gastric contents.  Patient is receiving TPN with Clinimix E 5/15 @ 40 ml/hr and lipids @ 10 ml/hr. Provides 1162 kcal and 48 grams protein per day. Meets 82% minimum estimated energy needs and 56% minimum estimated protein needs.  Diet Order:  Diet NPO time specified TPN (CLINIMIX-E) Adult TPN (CLINIMIX-E) Adult  Skin:  sternal wound closed  Last BM:  10/13  Height:   Ht Readings from Last 1 Encounters:  01/26/15 5' (1.524 m)    Weight:   Wt Readings from Last 1 Encounters:  02/19/15 106 lb 7.7 oz (48.3 kg)    Ideal Body Weight:  45.4 kg  BMI:  Body mass index is 20.8 kg/(m^2).  Estimated Nutritional Needs:   Kcal:   1417  Protein:  85-95 gm  Fluid:  per MD  EDUCATION NEEDS:   No education needs identified at this time  Maureen ChattersKatie Tamiki Kuba, RD, LDN Pager #: [phone] After-Hours Pager #: [phone]

## 2015-02-19 NOTE — Progress Notes (Signed)
      [address]       [place] [id number]             [phone]    Intubated, sedated   S/p CABG with Centrimag placement 02/01/15  Centrimag explanted 9/30.   Sternal Closure 10/3.   10/7 Extubated, reintubated 2 hours later.   BP 150/78 mmHg  Pulse 131  Temp(Src) 99.8 F (37.7 C) (Core (Comment))  Resp 20  Ht 5' (1.524 m)  Wt 106 lb 7.7 oz (48.3 kg)  BMI 20.80 kg/m2  SpO2 97%   Intake/Output Summary (Last 24 hours) at 02/19/15 1648 Last data filed at 02/19/15 1600  Gross per 24 hour  Intake 2564.68 ml  Output   3105 ml  Net -540.32 ml    Vent per CCM  Joe Tanney C. Dorris FetchHendrickson, MD [facility] [phone]

## 2015-02-19 NOTE — Progress Notes (Signed)
11 Days Post-Op Procedure(s) (LRB): STERNAL CLOSURE (N/A) TRANSESOPHAGEAL ECHOCARDIOGRAM (TEE) (N/A) Subjective: Aspiration R pneumonia on tube feeds, trached with agitation O2 sats better now- cont vanc, Zosyn, cultures neg Sinus tach  Objective: Vital signs in last 24 hours: Temp:  [98.8 F (37.1 C)-100.8 F (38.2 C)] 100.1 F (37.8 C) (10/14 1900) Pulse Rate:  [43-140] 115 (10/14 1900) Cardiac Rhythm:  [-] Sinus tachycardia (10/14 1600) Resp:  [13-46] 28 (10/14 1900) BP: (96-164)/(42-93) 112/57 mmHg (10/14 1900) SpO2:  [95 %-100 %] 100 % (10/14 1900) Arterial Line BP: (85-178)/(42-80) 146/66 mmHg (10/14 1830) FiO2 (%):  [40 %-50 %] 40 % (10/14 1548) Weight:  [106 lb 7.7 oz (48.3 kg)] 106 lb 7.7 oz (48.3 kg) (10/14 0500)  Hemodynamic parameters for last 24 hours: CVP:  [11 mmHg-36 mmHg] 26 mmHg  Intake/Output from previous day: 10/13 0701 - 10/14 0700 In: 2162.4 [I.V.:726.4; NG/GT:30; IV Piggyback:550; TPN:856] Out: 3140 [Urine:3000; Emesis/NG output:120; Stool:20] Intake/Output this shift:    Coarse breath sounds abd non distended  Lab Results:  Recent Labs  02/18/15 0450  02/19/15 0525 02/19/15 1635  WBC 14.4*  --  15.9*  --   HGB 9.7*  < > 9.0* 10.2*  HCT 29.9*  < > 27.9* 30.0*  PLT 498*  --  459*  --   < > = values in this interval not displayed. BMET:  Recent Labs  02/18/15 0450  02/19/15 0525 02/19/15 1635  NA 147*  < > 144 148*  K 3.3*  < > 3.9 4.0  CL 111  < > 111 112*  CO2 20*  --  22  --   GLUCOSE 129*  < > 122* 134*  BUN 56*  < > 67* 65*  CREATININE 1.28*  < > 1.54* 1.70*  CALCIUM 8.6*  --  8.7*  --   < > = values in this interval not displayed.  PT/INR: No results for input(s): LABPROT, INR in the last 72 hours. ABG    Component Value Date/Time   PHART 7.381 02/19/2015 0750   HCO3 21.9 02/19/2015 0750   TCO2 21 02/19/2015 1635   ACIDBASEDEF 2.3* 02/19/2015 0750   O2SAT 96.5 02/19/2015 0750   CBG (last 3)   Recent Labs   02/19/15 0836 02/19/15 1215 02/19/15 1557  GLUCAP 95 115* 111*    Assessment/Plan: S/P Procedure(s) (LRB): STERNAL CLOSURE (N/A) TRANSESOPHAGEAL ECHOCARDIOGRAM (TEE) (N/A) Plan- cont Milrinone Cont TNA until mon as lungs are improving treratment of agitation has been problematic   LOS: 24 days    Kathlee Nationseter Van Trigt III 02/19/2015

## 2015-02-19 NOTE — Progress Notes (Signed)
PARENTERAL NUTRITION CONSULT NOTE  Pharmacy Consult for TPN Indication: Prolonged ileus  Allergies  Allergen Reactions  . Atorvastatin Other (See Comments)    Gas, chest tightness  . Crestor [Rosuvastatin Calcium] Other (See Comments)    Muscle aches   Patient Measurements: Height: 5' (152.4 cm) Weight: 106 lb 7.7 oz (48.3 kg) IBW/kg (Calculated) : 45.5  Usual Weight: ~100-106 pounds (46-48 kg)  Vital Signs: Temp: 100.3 F (37.9 C) (10/14 0800) Temp Source: Core (Comment) (10/14 0400) BP: 100/53 mmHg (10/14 0800) Pulse Rate: 110 (10/14 0800) Intake/Output from previous day: 10/13 0701 - 10/14 0700 In: 2102.4 [I.V.:716.4; NG/GT:30; IV Piggyback:550; TPN:806] Out: 3140 [Urine:3000; Emesis/NG output:120; Stool:20] Intake/Output from this shift: Total I/O In: 18.4 [I.V.:18.4] Out: -  Labs:  Recent Labs  02/17/15 0415  02/18/15 0450 02/18/15 1646 02/18/15 2224 02/19/15 0525  WBC 11.5*  --  14.4*  --   --  15.9*  HGB 9.7*  < > 9.7* 9.9* 10.2* 9.0*  HCT 29.6*  < > 29.9* 29.0* 30.0* 27.9*  PLT 548*  --  498*  --   --  459*  < > = values in this interval not displayed.  Recent Labs  02/17/15 0415  02/18/15 0450 02/18/15 1646 02/18/15 2224 02/19/15 0525  NA 142  < > 147* 146* 150* 144  K 3.7  < > 3.3* 3.1* 3.5 3.9  CL 109  < > 111 109 113* 111  CO2 25  --  20*  --   --  22  GLUCOSE 185*  < > 129* 259* 92 122*  BUN 47*  < > 56* 51* 57* 67*  CREATININE 0.97  < > 1.28* 1.30* 1.50* 1.54*  CALCIUM 8.1*  --  8.6*  --   --  8.7*  MG  --   --  1.9  --   --  2.4  PHOS  --   --  3.9  --   --  4.5  PROT 6.5  --  7.2  --   --  7.0  ALBUMIN 2.4*  --  2.4*  --   --  2.4*  AST 39  --  170*  --   --  100*  ALT 69*  --  183*  --   --  167*  ALKPHOS 187*  --  184*  --   --  155*  BILITOT 1.9*  --  2.5*  --   --  2.2*  TRIG  --   --   --   --   --  125  < > = values in this interval not displayed. Estimated Creatinine Clearance: 27.2 mL/min (by C-G formula based on Cr of  1.54).   Recent Labs  02/18/15 2324 02/19/15 0352 02/19/15 0836  GLUCAP 84 120* 95   Medical History: Past Medical History  Diagnosis Date  . Hyperlipidemia   . Acute systolic CHF (congestive heart failure), NYHA class 3 (HCC) 07/10/2014  . Hypertension    Medications:  Infusions:  . dextrose 10 mL/hr at 02/19/15 0600  . Marland KitchenTPN (CLINIMIX-E) Adult 40 mL/hr at 02/19/15 0600   And  . fat emulsion 10 mL (02/19/15 0600)  . fentaNYL infusion INTRAVENOUS 300 mcg/hr (02/19/15 0928)  . milrinone 0.25 mcg/kg/min (02/19/15 0800)   Admit:  64 yo female with cardiac problems and recent CABG on 9/26 and Centrimag removal on 9/30 with an open sternum and plans for return to OR 10/3. Complicated post-op course.  Surgeries/Procedures: S/p CABG x4, TEE,  Removal of CVAD, Sternal closure  Insulin Requirements in the past 24 hours:  Novolog 20 units/24h  Current Nutrition:  Clinimix E 5/15 at 75 ml/hr and IVFA 20% at 10 ml/hr only 3x/week will provide 90g protein daily and an average of 1552 kcal daily.  Current IVF: D5W at 10   Nutritional Goals: 10/12 1426 kcal  85-95 g protein  GI: Post-op ileus,  Albumin low at 2.4. Prealbumin low at 6.6 (10/3). Last BM was 10/12. Previously on TPN 10/1 > 10/6, was transitioned to TF, these were turned off 10/13 due to possible aspiration.   Endo: cbgs 84-193 on ssi, pt previously req'd 20-40 units of insulin in TPN. Will not increase insulin today due to 2 values <100.  Lytes: K 3.9, Mg 2.4, Phos 4.5, iCa 1.23, Ca x Phos < 55, Na 144 down.  Renal: SCr 1 > 1.54, eCrCl 27 ml/min, UOP 2.6  Pulm: s/p trach 10/10, difficult time weaning from vent, MD notes worsening cxr  Cards: HTN, CAD. EF 20-25%. 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 sHF. S/p sternum closure 10/3. BP 100/53, tachy. Meds: Milrinone 0.25, lasix IV, [id number], metoprolol. Having runs of SVT.  Hepatobil: New onset transaminitis. Previously elevated 1 week ago. AST 39 >170>100,  ALT 69 >183>167, Tbili 1.9 > 2.5>2.2. Triglycerides 125  Neuro: Still having significant agitated delirium . Fent increased to 3100mcg/hr. On seroquel, Haldol scheduled. GCS 13, RASS +2 (goal -2)  ID: Abx for possible aspiration pneumonia, wbc 11.5 > 14.4>15.9, Tm 100.8 Vanc 9/26 > 9/30, 10/3 >> Ceftaz 10/6 > 10/12 Zosyn 10/12 >>  Best Practices: Lovenox 30/d (CrCl<30 and weight only 48 kg),   TPN Access: PICC  TPN start date: 10/1 > 10/6, 10/13 >   Plan:  E 5/15 at 75 ml/hr and IVFA 20% at 10 ml/hr only 3x/week will provide 90g protein daily and an average of 1552 kcal daily. Multivitamin, will add to TPN Pepcid, will add to TPN Add Trace elements on MWF Add insulin regular 10 units to TPN. Will not increase today. F/u new nutrition consult for goals.   Ilisa Hayworth S. Merilynn Finlandobertson, PharmD, [facility] LLCBCPS Clinical Staff Pharmacist Pager [phone]   02/19/2015 10:16 AM

## 2015-02-19 NOTE — Progress Notes (Signed)
PULMONARY / CRITICAL CARE MEDICINE   Name: Breanna Alvarez MRN: [id number] DOB: 23-Mar-1951    ADMISSION DATE:  01/26/2015 CONSULTATION DATE:  10/5  REFERRING MD :  Donata Clay   CHIEF COMPLAINT:  Vent management   INITIAL PRESENTATION: 63yo female never smoker with hx HTN, CAD previously refused CABG ultimately admitted 9/20 for CVTS eval and CABG after ongoing chest pain.  She underwent 4V CABG 9/26 and has had complicated course with cardiogenic shock s/p centrimag placement and removal, ischemic cardiomyopathy with EF 20%, open sternum now s/p sternal closure 10/3.  She remains intubated since initial CABG 9/26 with poor weaning and PCCM now consulted to assist.   STUDIES:  9/21 2D echo>> EF 20-25%, mod MR, PA press 51 mmHg 9/30 TEE>>> EF 30-35%, hypokinesis of apical myocardium, akinesis of anteroseptal myocardium, mild TR, improved LV function    SIGNIFICANT EVENTS: 9/26 CABG x 4, placement of centrimag L ventricular assistive device  9/30 removal centrimag  10/3 sternal closure 02/14/15:   Hg drop overnight by 1 gm and transfusion ordered. Hypotension overnight with increase in precedex.  02/06/15- s/p trach - Tyson Alias  10//11/16 - Doing well post trach  . On milrinone and precedex. Off neo. Weaning but deconditioned (hx as gathered from bedside RN and d/w Dr Laneta Simmers). TMax 5F. CXR worse  02/17/15 - d/w RN - uanble to wean precedex due to agitation. On milrinone and precedex. Very deconditioned.    02/18/2015 : worsening resp mechanics fio2 50%, RR 40 and dysnchrony with worsening cxr. Concern for aspiration of tube feeds overnight. Being made NPO with LIS and startting tPN. Still on precedex and neo. Tracheal aspirate pending . Cardiac interatcing meds right now -> milrinone, precedex, coreg, lasix   SUBJECTIVE/OVERNIGHT/INTERVAL HX  02/19/15: Cards increased lasix. Now in negative balance 1L with improved CXR. Also, on TPN d2. On fent gtt but having significant agitated  delirium despite fent gtt though better. RN reports versed possibly causing hypotension after bolus.  Worsening Creat 1.5mg % but LFT better   VITAL SIGNS: Temp:  [98.8 F (37.1 C)-100.8 F (38.2 C)] 100.3 F (37.9 C) (10/14 0800) Pulse Rate:  [43-141] 110 (10/14 0800) Resp:  [16-46] 18 (10/14 0800) BP: (91-153)/(43-93) 100/53 mmHg (10/14 0800) SpO2:  [86 %-100 %] 100 % (10/14 0800) Arterial Line BP: (92-172)/(45-77) 92/45 mmHg (10/14 0800) FiO2 (%):  [40 %-50 %] 40 % (10/14 0750) Weight:  [48.3 kg (106 lb 7.7 oz)] 48.3 kg (106 lb 7.7 oz) (10/14 0500)   HEMODYNAMICS: CVP:  [13 mmHg-20 mmHg] 17 mmHg   VENTILATOR SETTINGS: Vent Mode:  [-] PRVC FiO2 (%):  [40 %-50 %] 40 % Set Rate:  [14 bmp] 14 bmp Vt Set:  [370 mL] 370 mL PEEP:  [5 cmH20] 5 cmH20 Plateau Pressure:  [24 cmH20-30 cmH20] 24 cmH20  INTAKE / OUTPUT:  Intake/Output Summary (Last 24 hours) at 02/19/15 0851 Last data filed at 02/19/15 0600  Gross per 24 hour  Intake 2080.57 ml  Output   3075 ml  Net -994.43 ml   PHYSICAL EXAMINATION: General:  Acute on chronic ill appearing female, VERY  DECONDITIONED Neuro: RASS +3 on fentanyl gtt> Not oriented HEENT:  Moist mucus membranes, ETT. Cardiovascular:  S1, S2, RRR, Lungs:  Clear, no Wheeze or crackles. Abdomen:  Round, soft, non tender, +bs  Musculoskeletal:  Warm and dry, generalized edema   LABS: PULMONARY  Recent Labs Lab 02/14/15 0415  02/15/15 0430  02/18/15 0840 02/18/15 1020 02/18/15 1110 02/18/15 1635 02/18/15  1646 02/18/15 2224 02/19/15 0530 02/19/15 0750  PHART 7.400  --  7.411  --  7.427 7.437  --   --   --   --   --  7.381  PCO2ART 41.4  --  36.4  --  25.9* 24.3*  --   --   --   --   --  38.1  PO2ART 136*  --  88.2  --  72.5* 82.7  --   --   --   --   --  91.1  HCO3 25.0*  --  22.6  --  16.8* 16.0*  --  19.2*  --   --   --  21.9  TCO2 26.3  --  23.7  < > 17.5 16.7  --  20.2 19 20   --  23.0  O2SAT 98.7  < > 96.7  < > 93.6 95.6 77.6 78.4  --    --  74.1 96.5  < > = values in this interval not displayed.  CBC  Recent Labs Lab 02/17/15 0415  02/18/15 0450 02/18/15 1646 02/18/15 2224 02/19/15 0525  HGB 9.7*  < > 9.7* 9.9* 10.2* 9.0*  HCT 29.6*  < > 29.9* 29.0* 30.0* 27.9*  WBC 11.5*  --  14.4*  --   --  15.9*  PLT 548*  --  498*  --   --  459*  < > = values in this interval not displayed.  COAGULATION  Recent Labs Lab 02/12/15 1010 02/13/15 0430  INR 1.56* 1.44    CARDIAC  No results for input(s): TROPONINI in the last 168 hours. No results for input(s): PROBNP in the last 168 hours.   CHEMISTRY  Recent Labs Lab 02/14/15 0409 02/15/15 0416 02/16/15 0445 02/17/15 0415 02/17/15 1654 02/18/15 0450 02/18/15 1646 02/18/15 2224 02/19/15 0525  NA 137 139 141 142 147* 147* 146* 150* 144  K 3.5 4.0 3.5 3.7 4.3 3.3* 3.1* 3.5 3.9  CL 96* 104 107 109 112* 111 109 113* 111  CO2 --  20*  --   --  22  GLUCOSE 221* 163* 166* 185* 209* 129* 259* 92 122*  BUN 65* 65* 58* 47* 56* 56* 51* 57* 67*  CREATININE 1.56* 1.29* 1.09* 0.97 1.00 1.28* 1.30* 1.50* 1.54*  CALCIUM 8.3* 8.5* 8.3* 8.1*  --  8.6*  --   --  8.7*  MG 2.0 2.1  --   --   --  1.9  --   --  2.4  PHOS 4.9* 4.6  --   --   --  3.9  --   --  4.5   Estimated Creatinine Clearance: 27.2 mL/min (by C-G formula based on Cr of 1.54).   LIVER  Recent Labs Lab 02/12/15 1010 02/13/15 0430  02/15/15 0416 02/16/15 0445 02/17/15 0415 02/18/15 0450 02/19/15 0525  AST  --  95*  < > 37 38 39 170* 100*  ALT  --  149*  < > 82* 69* 69* 183* 167*  ALKPHOS  --  278*  < > 210* 173* 187* 184* 155*  BILITOT  --  3.1*  < > 2.2* 2.0* 1.9* 2.5* 2.2*  PROT  --  5.9*  < > 5.8* 5.9* 6.5 7.2 7.0  ALBUMIN  --  2.2*  < > 2.2* 2.2* 2.4* 2.4* 2.4*  INR 1.56* 1.44  --   --   --   --   --   --   < > =  values in this interval not displayed.   INFECTIOUS No results for input(s): LATICACIDVEN, PROCALCITON in the last 168 hours.   ENDOCRINE CBG (last 3)    Recent Labs  02/18/15 1926 02/18/15 2324 02/19/15 0352  GLUCAP 152* 84 120*         IMAGING x48h  - image(s) personally visualized  -   highlighted in bold Dg Abd 1 View  02/17/2015  CLINICAL DATA:  Feeding tube placement. EXAM: ABDOMEN - 1 VIEW COMPARISON:  02/17/2015 at 9:23 a.m. FINDINGS: The curvature of the feeding tube suggests that its tip is in the distal third part of the duodenum. Epicardial pacer leads observed. Left basilar airspace opacity. Prior CABG. Bowel gas pattern unremarkable. IMPRESSION: 1. Curvature the feeding tube suggests that its tip is in the distal third part of the duodenum. 2. Left basilar airspace opacity. Electronically Signed   By: Gaylyn Rong M.D.   On: 02/17/2015 12:25   Dg Chest Port 1 View  02/19/2015  CLINICAL DATA:  Mitral valve replacement, shortness of breath EXAM: PORTABLE CHEST 1 VIEW COMPARISON:  02/18/2015 FINDINGS: Endotracheal tube is 4.8 cm above the carina. NG tube and feeding tube extend into the stomach. Postop changes from median sternotomy. Stable cardiomegaly with slight improvement in the asymmetric bilateral airspace disease, but still worse in the right upper lobe. No enlarging effusion or pneumothorax. Left PICC line tip in the lower SVC. IMPRESSION: Minimal improvement in asymmetric patchy bilateral airspace process compatible with bilateral pneumonia versus asymmetric alveolar edema. No enlarging effusion or pneumothorax Stable cardiomegaly Electronically Signed   By: Judie Petit.  Shick M.D.   On: 02/19/2015 08:04   Dg Chest Port 1 View  02/18/2015  ADDENDUM REPORT: 02/18/2015 09:12 ADDENDUM: The nurse for this patient, Vernona Rieger, called me and asked about the dictated report from 02/18/2015. An error obviously was made in the dictation, possibly having viewed another patient's chest x-ray at that time. The current dictation is as follows: Compared to the chest x-ray of 02/17/2015, there has been worsening of airspace disease in the  right upper and mid lung field suspicious for pneumonia. Also there appears to be edema present with cardiomegaly and possible effusions, indicative of CHF. Tracheostomy, left central venous line, and feeding tube remain. IMPRESSION: Worsening of airspace disease throughout the right lung most consistent with pneumonia. Probable superimposed CHF as well. Electronically Signed   By: Dwyane Dee M.D.   On: 02/18/2015 09:12  02/18/2015  EXAM: PORTABLE CHEST 1 VIEW COMPARISON:  None. FINDINGS: The heart size and mediastinal contours are within normal limits. Both lungs are clear. The visualized skeletal structures are unremarkable. IMPRESSION: No active disease. Electronically Signed: By: Dwyane Dee M.D. On: 02/18/2015 07:58   Dg Abd Portable 1v  02/18/2015  CLINICAL DATA:  Feeding tube placement. EXAM: PORTABLE ABDOMEN - 1 VIEW this COMPARISON:  Abdominal radiograph 02/17/2015 FINDINGS: Enteric tube tip and side-port project over the stomach. Additionally there is a feeding tube with the tip projecting the expected location of the fourth portion of the duodenum. Unremarkable bowel gas pattern. Heterogeneous opacities bilateral lung bases. Lumbar spine degenerative changes. Surgical staple line overlying chest. IMPRESSION: NG tube tip and side-port project over the stomach. Feeding tube tip stable in position projecting at the fourth portion of the duodenum. Electronically Signed   By: Annia Belt M.D.   On: 02/18/2015 10:36   Dg Abd Portable 1v  02/17/2015  CLINICAL DATA:  Feeding tube placement EXAM: PORTABLE ABDOMEN - 1 VIEW COMPARISON:  Study obtained earlier in the day FINDINGS: Feeding tube tip is at or just beyond the ligament of Treitz. Bowel gas pattern unremarkable. Pacemaker lead wires are attached to the right heart. IMPRESSION: Feeding tube tip at or just beyond the ligament of Treitz. Bowel gas pattern unremarkable. Electronically Signed   By: Bretta BangWilliam  Woodruff III M.D.   On: 02/17/2015 21:34    Dg Abd Portable 1v  02/17/2015  CLINICAL DATA:  Encounter for feeding tube placement. EXAM: PORTABLE ABDOMEN - 1 VIEW COMPARISON:  February 10, 2015. FINDINGS: The bowel gas pattern is normal. Distal tip of feeding tube remains in the expected position of jejunum. IMPRESSION: No evidence of bowel obstruction or ileus. Distal tip of feeding tube remains in the expected position of jejunum. Electronically Signed   By: Lupita RaiderJames  Green Jr, M.D.   On: 02/17/2015 09:40        ASSESSMENT / PLAN:  PULMONARY OETT 9/26>>>10/7>>>10/7 > 10/10/'16 TRACH by Tyson AliasFeinstein Acute on Chronic respiratory failure - multifactorial in setting heart failure, cardiogenic shock and severe deconditioning post multiple surgeries    - 02/19/15:  Currently deconditioning and pulmonary infitlrates and agitation preventing good wean. Having vent dysnchrony - some improved after change from precedex to fent gtt x 24h    P:   Trach care Full vent support Continuous sedation needed   CARDIOVASCULAR CVL R IJ cordis 9/26>>>10/2 LUA PICC >>>  CAD s/p CABG  Acute systolic heart failure  Ischemic cardiomyopathy  Cardiogenic shock   - 02/19/15: on milrinone,  + lasix (increased by cards 02/18/15) + lopressor for SVT  P:  Per Cards and CVTS - appreciate diuresis   RENAL  Hyponatremia  AKI - mild but worsening 10/7 with diuresis Hypokalemia   - creat worsening with diuresis. Mag normalized. K improved but still < 4   P:   Replete K - goal > 4 due to ileus Ensure mag > 2 given cardiac issues  F/u chem  KVO IVF Diuresis per crds  GASTROINTESTINAL  Recent Labs Lab 02/12/15 1010 02/13/15 0430  02/15/15 0416 02/16/15 0445 02/17/15 0415 02/18/15 0450 02/19/15 0525  AST  --  95*  < > 37 38 39 170* 100*  ALT  --  149*  < > 82* 69* 69* 183* 167*  ALKPHOS  --  278*  < > 210* 173* 187* 184* 155*  BILITOT  --  3.1*  < > 2.2* 2.0* 1.9* 2.5* 2.2*  PROT  --  5.9*  < > 5.8* 5.9* 6.5 7.2 7.0  ALBUMIN  --   2.2*  < > 2.2* 2.2* 2.4* 2.4* 2.4*  INR 1.56* 1.44  --   --   --   --   --   --   < > = values in this interval not displayed.  Protein calorie malnutrition  Transaminitis, ? Etiology. At risk shock liver. Not currently on statin or other offending meds  - tube feeds possibly aspirated. New onset transaminitis 02/18/15. Improved 02/19/2015. STarted on TPN 02/18/15   P:   DC tylenol prn due to LFT rise Check LFT  Holding TF per nutrition and CVTS.  TPN per CVTS PPI  HEMATOLOGIC Thrombocytopenia - improved  Anemia - mild P:  F/u CBC Lovenox    INFECTIOUS Wound (Sternum) 10/3>>>NTD Sputum 10/4>>> NTD Open sternum > closed   resp culture  02/17/15 Blood culture 02/18/15 >>      02/19/15 - Temp 101F  X 36h.  Increased pulm infitlrate . Possible aspiration 02/17/15  v VAP. Culture negative so far  P:   Vancomycin 10/5>>> Ceftaz 10/6>> 10/12, Zosyn 10/12 >>   ENDOCRINE Hyperglycemia  P:   SSI  NEUROLOGIC WOrening vent dysnchrony 02/18/15 Ongoing agitated delirium - 02/19/15 despite fentanyl gtt thought better somewhat without prcedex. RASS +3   P:   START RESTRAINTS Hold off precedex since 02/18/15 due to resp demand Fent gtt since 02/18/15 to continue Seroquel QHs continue 50mg  qhs - aim to slowly uptirate depending on coruse Increase low dose haldol 1mg  prn to 5mg  Q6h scheduled x 02/19/15 x 24h (monitor QTc closely)  If above, fails > change Versed prn do gtt RASS goal: -2  DC xanaz qhs Dc Dilaudid prn    FAMILY  - Updates:  Dr Tyson Alias consented family for trach 02/15/15. None at bedside 02/17/2015, 02/18/15, 10./14/16    The patient is critically ill with multiple organ systems failure and requires high complexity decision making for assessment and support, frequent evaluation and titration of therapies, application of advanced monitoring technologies and extensive interpretation of multiple databases.   Critical Care Time devoted to patient care  services described in this note is  35  Minutes. This time reflects time of care of this signee Dr Kalman Shan. This critical care time does not reflect procedure time, or teaching time or supervisory time of PA/NP/Med student/Med Resident etc but could involve care discussion time    Dr. Kalman Shan, M.D., [facility].C.P Pulmonary and Critical Care Medicine Staff Physician [facility] [facility] Pager: [phone], If no answer or between  15:00h - 7:00h: call [phone]  02/19/2015 8:51 AM

## 2015-02-20 ENCOUNTER — Inpatient Hospital Stay (HOSPITAL_COMMUNITY): Payer: Medicaid Other

## 2015-02-20 DIAGNOSIS — I509 Heart failure, unspecified: Secondary | ICD-10-CM

## 2015-02-20 LAB — GLUCOSE, CAPILLARY
GLUCOSE-CAPILLARY: 107 mg/dL — AB (ref 65–99)
GLUCOSE-CAPILLARY: 138 mg/dL — AB (ref 65–99)
GLUCOSE-CAPILLARY: 152 mg/dL — AB (ref 65–99)
GLUCOSE-CAPILLARY: 159 mg/dL — AB (ref 65–99)
GLUCOSE-CAPILLARY: 161 mg/dL — AB (ref 65–99)
GLUCOSE-CAPILLARY: 165 mg/dL — AB (ref 65–99)
Glucose-Capillary: 140 mg/dL — ABNORMAL HIGH (ref 65–99)
Glucose-Capillary: 111 mg/dL — ABNORMAL HIGH (ref 65–99)

## 2015-02-20 LAB — COMPREHENSIVE METABOLIC PANEL
ALT: 134 U/L — AB (ref 14–54)
ANION GAP: 13 (ref 5–15)
AST: 64 U/L — ABNORMAL HIGH (ref 15–41)
Albumin: 2.3 g/dL — ABNORMAL LOW (ref 3.5–5.0)
Alkaline Phosphatase: 154 U/L — ABNORMAL HIGH (ref 38–126)
BUN: 74 mg/dL — ABNORMAL HIGH (ref 6–20)
CALCIUM: 8.4 mg/dL — AB (ref 8.9–10.3)
CHLORIDE: 104 mmol/L (ref 101–111)
CO2: 25 mmol/L (ref 22–32)
CREATININE: 1.7 mg/dL — AB (ref 0.44–1.00)
GFR, EST AFRICAN AMERICAN: 36 mL/min — AB (ref >60)
GFR, EST NON AFRICAN AMERICAN: 31 mL/min — AB (ref >60)
Glucose, Bld: 166 mg/dL — ABNORMAL HIGH (ref 65–99)
Potassium: 3.5 mmol/L (ref 3.5–5.1)
Sodium: 142 mmol/L (ref 135–145)
Total Bilirubin: 2.2 mg/dL — ABNORMAL HIGH (ref 0.3–1.2)
Total Protein: 6.9 g/dL (ref 6.5–8.1)

## 2015-02-20 LAB — BLOOD GAS, ARTERIAL
Acid-base deficit: 3.3 mmol/L — ABNORMAL HIGH (ref 0.0–2.0)
Bicarbonate: 22.3 mEq/L (ref 20.0–24.0)
Drawn by: 44135
FIO2: 0.4
MECHVT: 370 mL
O2 Saturation: 97.8 %
PEEP: 5 cmH2O
Patient temperature: 99.9
RATE: 14 resp/min
TCO2: 23.8 mmol/L (ref 0–100)
pCO2 arterial: 50.2 mmHg — ABNORMAL HIGH (ref 35.0–45.0)
pH, Arterial: 7.275 — ABNORMAL LOW (ref 7.350–7.450)
pO2, Arterial: 110 mmHg — ABNORMAL HIGH (ref 80.0–100.0)

## 2015-02-20 LAB — PHOSPHORUS: PHOSPHORUS: 6.6 mg/dL — AB (ref 2.5–4.6)

## 2015-02-20 LAB — CBC
HCT: 28.1 % — ABNORMAL LOW (ref 36.0–46.0)
Hemoglobin: 8.6 g/dL — ABNORMAL LOW (ref 12.0–15.0)
MCH: 27.9 pg (ref 26.0–34.0)
MCHC: 30.6 g/dL (ref 30.0–36.0)
MCV: 91.2 fL (ref 78.0–100.0)
PLATELETS: 419 10*3/uL — AB (ref 150–400)
RBC: 3.08 MIL/uL — AB (ref 3.87–5.11)
RDW: 18.2 % — ABNORMAL HIGH (ref 11.5–15.5)
WBC: 15.3 10*3/uL — ABNORMAL HIGH (ref 4.0–10.5)

## 2015-02-20 LAB — CARBOXYHEMOGLOBIN
Carboxyhemoglobin: 1.5 % (ref 0.5–1.5)
Methemoglobin: 1.1 % (ref 0.0–1.5)
O2 Saturation: 85.9 %
Total hemoglobin: 8.1 g/dL — ABNORMAL LOW (ref 12.0–16.0)

## 2015-02-20 LAB — MAGNESIUM: MAGNESIUM: 2.2 mg/dL (ref 1.7–2.4)

## 2015-02-20 MED ORDER — METOPROLOL TARTRATE 25 MG PO TABS
25.0000 mg | ORAL_TABLET | Freq: Two times a day (BID) | ORAL | Status: DC
  Filled 2015-02-20: qty 1

## 2015-02-20 MED ORDER — HALOPERIDOL LACTATE 5 MG/ML IJ SOLN
1.0000 mg | Freq: Four times a day (QID) | INTRAMUSCULAR | Status: DC | PRN
  Administered 2015-02-20 – 2015-02-21 (×2): 4 mg via INTRAVENOUS
  Filled 2015-02-20 (×2): qty 1

## 2015-02-20 MED ORDER — CLONAZEPAM 0.5 MG PO TABS
0.5000 mg | ORAL_TABLET | Freq: Two times a day (BID) | ORAL | Status: DC
  Administered 2015-02-20 – 2015-02-22 (×5): 0.5 mg via ORAL
  Filled 2015-02-20 (×5): qty 1

## 2015-02-20 MED ORDER — METOPROLOL TARTRATE 25 MG PO TABS
25.0000 mg | ORAL_TABLET | Freq: Two times a day (BID) | ORAL | Status: DC
  Administered 2015-02-20 – 2015-02-22 (×6): 25 mg via ORAL
  Filled 2015-02-20 (×8): qty 1

## 2015-02-20 MED ORDER — PANTOPRAZOLE SODIUM 40 MG PO PACK
40.0000 mg | PACK | Freq: Every day | ORAL | Status: DC
  Administered 2015-02-20 – 2015-02-22 (×3): 40 mg
  Filled 2015-02-20 (×5): qty 20

## 2015-02-20 MED ORDER — QUETIAPINE FUMARATE 100 MG PO TABS
100.0000 mg | ORAL_TABLET | Freq: Every day | ORAL | Status: DC
  Administered 2015-02-20 – 2015-03-14 (×23): 100 mg via ORAL
  Filled 2015-02-20 (×16): qty 1
  Filled 2015-02-20: qty 2
  Filled 2015-02-20 (×9): qty 1

## 2015-02-20 MED ORDER — CLONAZEPAM 0.1 MG/ML ORAL SUSPENSION
0.5000 mg | Freq: Two times a day (BID) | ORAL | Status: DC
  Filled 2015-02-20 (×2): qty 5

## 2015-02-20 MED ORDER — POTASSIUM CHLORIDE 20 MEQ/15ML (10%) PO SOLN
40.0000 meq | Freq: Once | ORAL | Status: AC
  Administered 2015-02-20: 40 meq
  Filled 2015-02-20: qty 30

## 2015-02-20 MED ORDER — TRACE MINERALS CR-CU-MN-SE-ZN 10-1000-500-60 MCG/ML IV SOLN
INTRAVENOUS | Status: AC
  Administered 2015-02-20: 18:00:00 via INTRAVENOUS
  Filled 2015-02-20: qty 1800

## 2015-02-20 NOTE — Progress Notes (Addendum)
PARENTERAL NUTRITION CONSULT NOTE  Pharmacy Consult for TPN Indication: Prolonged ileus  Allergies  Allergen Reactions  . Atorvastatin Other (See Comments)    Gas, chest tightness  . Crestor [Rosuvastatin Calcium] Other (See Comments)    Muscle aches   Patient Measurements: Height: 5' (152.4 cm) Weight: 106 lb 7.7 oz (48.3 kg) IBW/kg (Calculated) : 45.5  Usual Weight: ~100-106 pounds (46-48 kg)  Vital Signs: Temp: 99.8 F (37.7 C) (10/15 0730) Temp Source: Core (Comment) (10/15 0000) BP: 117/50 mmHg (10/15 0730) Pulse Rate: 116 (10/15 0730) Intake/Output from previous day: 10/14 0701 - 10/15 0700 In: 3275.8 [I.V.:1452.3; IV Piggyback:250; TPN:1573.5] Out: 3090 [Urine:3090] Intake/Output from this shift:   Labs:  Recent Labs  02/18/15 0450  02/19/15 0525 02/19/15 1635 02/20/15 0600  WBC 14.4*  --  15.9*  --  15.3*  HGB 9.7*  < > 9.0* 10.2* 8.6*  HCT 29.9*  < > 27.9* 30.0* 28.1*  PLT 498*  --  459*  --  419*  < > = values in this interval not displayed.  Recent Labs  02/18/15 0450  02/19/15 0525 02/19/15 1635 02/20/15 0600  NA 147*  < > 144 148* 142  K 3.3*  < > 3.9 4.0 3.5  CL 111  < > 111 112* 104  CO2 20*  --  22  --  25  GLUCOSE 129*  < > 122* 134* 166*  BUN 56*  < > 67* 65* 74*  CREATININE 1.28*  < > 1.54* 1.70* 1.70*  CALCIUM 8.6*  --  8.7*  --  8.4*  MG 1.9  --  2.4  --  2.2  PHOS 3.9  --  4.5  --  6.6*  PROT 7.2  --  7.0  --  6.9  ALBUMIN 2.4*  --  2.4*  --  2.3*  AST 170*  --  100*  --  64*  ALT 183*  --  167*  --  134*  ALKPHOS 184*  --  155*  --  154*  BILITOT 2.5*  --  2.2*  --  2.2*  TRIG  --   --  125  --   --   < > = values in this interval not displayed. Estimated Creatinine Clearance: 24.6 mL/min (by C-G formula based on Cr of 1.7).   Recent Labs  02/19/15 2006 02/19/15 2346 02/20/15 0420  GLUCAP 159* 165* 138*   Medical History: Past Medical History  Diagnosis Date  . Hyperlipidemia   . Acute systolic CHF (congestive  heart failure), NYHA class 3 (HCC) 07/10/2014  . Hypertension    Medications:  Infusions:  . dextrose 10 mL/hr at 02/19/15 0700  . Marland Kitchen.TPN (CLINIMIX-E) Adult 75 mL/hr at 02/19/15 1754   And  . fat emulsion 240 mL (02/19/15 1754)  . fentaNYL infusion INTRAVENOUS 300 mcg/hr (02/20/15 0400)  . milrinone 0.25 mcg/kg/min (02/19/15 1511)  . norepinephrine (LEVOPHED) Adult infusion 6 mcg/min (02/20/15 16100619)   Admit:  64 yo female with cardiac problems and recent CABG on 9/26 and Centrimag removal on 9/30 with an open sternum and plans for return to OR 10/3. Complicated post-op course.  Surgeries/Procedures: S/p CABG x4, TEE, Removal of CVAD, Sternal closure  Insulin Requirements in the past 24 hours:  Novolog 14 units/24h  Current Nutrition:  Clinimix E 5/15 at 75 ml/hr and IVFA 20% at 10 ml/hr on MWF    Nutritional Goals: Per RD assessment 10/14 1417 kcal  85-95 g protein  GI: Post-op ileus,  Albumin low at 2.4. Prealbumin low at 6.6 (10/3). Last BM was 10/12. Previously on TPN 10/1 > 10/6, was transitioned to TF, these were turned off 10/13 due to possible aspiration.   Endo: cbgs 95-166 on ssi, pt previously required 20-40 units of insulin in TPN. Monitor glucose  Lytes: K 3.5, Mg 2.2, Phos 6.6, CoCa 9.7, Ca x Phos: 64  Renal: SCr 1 > 1.74, eCrCl 25-30 ml/min, UOP 2.6. D5W @ 10 ml/hr  Pulm: s/p trach 10/10, difficult time weaning from vent, MD notes worsening cxr  Cards: HTN, CAD. EF 20-25%. 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 sHF. S/p sternum closure 10/3. BP 100/53, tachy. Meds: Milrinone 0.25, lasix IV bid, [id number], Levophed @ 6  Hepatobil: New onset transaminitis, now resolving. Previously elevated 1 week ago. AST 39 >170>100 > 64, ALT 69 >183>167>134, Tbili 2.2. Triglycerides 125  Neuro: Still having significant agitated delirium . Fent increased to 335mcg/hr. On seroquel, Haldol scheduled. GCS 13, RASS +2 (goal -2)  ID: Abx for possible aspiration pneumonia,  wbc 11.5 > 14.4>15.3, Tm 100.8 Vanc 9/26 > 9/30, 10/3 >> Ceftaz 10/6 > 10/12 Zosyn 10/12 >>  Best Practices: Lovenox 30 mg  TPN Access: PICC  TPN start date: 10/1 > 10/6, 10/13 >   Plan:  -5/15 at 75 ml/hr (no electrolytes) and IVFA 20% at 10 ml/hr on MWF will provide 90 g protein daily and an average of 1484 kcal daily. -Add Multivitamin and trace elements to TPN -Add Pepcid to TPN -Add insulin regular 10 units to TPN -Monitor renal fx and Phos -Potassium 40 mEq x1 -Stop lopressor while on Levophed    Agapito Games, PharmD, BCPS Clinical Pharmacist Pager: [phone] 02/20/2015 8:15 AM

## 2015-02-20 NOTE — Progress Notes (Signed)
  Echocardiogram 2D Echocardiogram has been performed.  Breanna Alvarez 02/20/2015, 9:16 AM

## 2015-02-20 NOTE — Progress Notes (Signed)
ANTIBIOTIC CONSULT NOTE - FOLLOW UP  Pharmacy Consult for Vancomycin  Indication: HCAP, sternal wound  Assessment: ID: Tmax=100.2, wbc 15.3, concerned for HCAP placed on vanc/fortaz, now concern for aspiration - switched to zosyn  Zinacef 10/1>>10/2 (4 doses)  Vanc 9/26>>9/30, restarted 10/3>>  10/1 VR = 16 (vanc stopped night before)  10/5 VT = 24 (confirmed with RN that she drew it before dose was hung, once level resulted she stopped the infusion so pt only received a little) will extend interval to q24  10/9 VT =22-received dose after level, will again extend to q36h  10/6 Ceftazidime>>10/12  10/12 zosyn >>  9/27 bld cx: 1/1 ng  9/30 wound: ng  10/4 TA - ngtd  10/12 resp cx   Goal of Therapy:  Vancomycin trough level 15-20 mcg/ml  Appropriate Zosyn dosing  Plan:  Continue Vancomycin 500 mg iv Q 36 hours - Consider stopping? (start date 10/3) Continue Zosyn 3.375 grams iv Q 8 hours (start date 10/11)     Allergies  Allergen Reactions  . Atorvastatin Other (See Comments)    Gas, chest tightness  . Crestor [Rosuvastatin Calcium] Other (See Comments)    Muscle aches   Labs:  Recent Labs  02/18/15 0450  02/19/15 0525 02/19/15 1635 02/20/15 0600  WBC 14.4*  --  15.9*  --  15.3*  HGB 9.7*  < > 9.0* 10.2* 8.6*  PLT 498*  --  459*  --  419*  CREATININE 1.28*  < > 1.54* 1.70* 1.70*  < > = values in this interval not displayed. Estimated Creatinine Clearance: 24.6 mL/min (by C-G formula based on Cr of 1.7). No results for input(s): VANCOTROUGH, VANCOPEAK, VANCORANDOM, GENTTROUGH, GENTPEAK, GENTRANDOM, TOBRATROUGH, TOBRAPEAK, TOBRARND, AMIKACINPEAK, AMIKACINTROU, AMIKACIN in the last 72 hours.   Thank you Okey RegalLisa Katisha Shimizu, PharmD [phone]  02/20/2015 11:24 AM

## 2015-02-20 NOTE — Progress Notes (Signed)
12 Days Post-Op Procedure(s) (LRB): STERNAL CLOSURE (N/A) TRANSESOPHAGEAL ECHOCARDIOGRAM (TEE) (N/A) Subjective: Intubated but follows commands  Objective: Vital signs in last 24 hours: Temp:  [99 F (37.2 C)-100.4 F (38 C)] 100.2 F (37.9 C) (10/15 0915) Pulse Rate:  [42-136] 113 (10/15 0915) Cardiac Rhythm:  [-] Sinus tachycardia (10/15 0800) Resp:  [13-31] 26 (10/15 0915) BP: (96-164)/(35-78) 98/35 mmHg (10/15 0915) SpO2:  [88 %-100 %] 100 % (10/15 0915) Arterial Line BP: (75-178)/(36-80) 86/39 mmHg (10/15 0915) FiO2 (%):  [40 %] 40 % (10/15 0919)  Hemodynamic parameters for last 24 hours: CVP:  [8 mmHg-36 mmHg] 9 mmHg  Intake/Output from previous day: 10/14 0701 - 10/15 0700 In: 3390.8 [I.V.:1482.3; IV Piggyback:250; TPN:1658.5] Out: 3090 [Urine:3090] Intake/Output this shift: Total I/O In: 185 [I.V.:40; NG/GT:60; TPN:85] Out: 300 [Urine:300]  General appearance: cooperative Neurologic: nonfocal Heart: tachy, regular Lungs: rhonchi bilateral Abdomen: normal findings: soft, non-tender  Lab Results:  Recent Labs  02/19/15 0525 02/19/15 1635 02/20/15 0600  WBC 15.9*  --  15.3*  HGB 9.0* 10.2* 8.6*  HCT 27.9* 30.0* 28.1*  PLT 459*  --  419*   BMET:  Recent Labs  02/19/15 0525 02/19/15 1635 02/20/15 0600  NA 144 148* 142  K 3.9 4.0 3.5  CL 111 112* 104  CO2 22  --  25  GLUCOSE 122* 134* 166*  BUN 67* 65* 74*  CREATININE 1.54* 1.70* 1.70*  CALCIUM 8.7*  --  8.4*    PT/INR: No results for input(s): LABPROT, INR in the last 72 hours. ABG    Component Value Date/Time   PHART 7.275* 02/20/2015 0550   HCO3 22.3 02/20/2015 0550   TCO2 23.8 02/20/2015 0550   ACIDBASEDEF 3.3* 02/20/2015 0550   O2SAT 97.8 02/20/2015 0550   CBG (last 3)   Recent Labs  02/19/15 2346 02/20/15 0420 02/20/15 0858  GLUCAP 165* 138* 152*    Assessment/Plan: S/P Procedure(s) (LRB): STERNAL CLOSURE (N/A) TRANSESOPHAGEAL ECHOCARDIOGRAM (TEE) (N/A) Remains  critically ill  CV- tachycardic, required increased levophed overnight, likely sepsis  Will increase lopressor for tachycardia  Continue milrinone, titrate levophed  RESP- pneumonia/ARDS- continue Zosyn, vent support  RENAL- creatinine stable, supplement K  ENDO- CBG Ok  Nutrition- TF on hold, on TNA     LOS: 25 days    Loreli SlotSteven C Elizah Lydon 02/20/2015

## 2015-02-20 NOTE — Progress Notes (Signed)
PULMONARY / CRITICAL CARE MEDICINE   Name: Breanna Alvarez MRN: [id number] DOB: 27-Jul-1950    ADMISSION DATE:  01/26/2015 CONSULTATION DATE:  10/5  REFERRING MD :  Donata Clay   CHIEF COMPLAINT:  Vent management   INITIAL PRESENTATION: 64yo female never smoker with hx HTN, CAD previously refused CABG ultimately admitted 9/20 for CVTS eval and CABG after ongoing chest pain.  She underwent 4V CABG 9/26 and has had complicated course with cardiogenic shock s/p centrimag placement and removal, ischemic cardiomyopathy with EF 20%, open sternum now s/p sternal closure 10/3.  She remains intubated since initial CABG 9/26 with poor weaning and PCCM now consulted to assist.   STUDIES:  9/21 2D echo>> EF 20-25%, mod MR, PA press 51 mmHg 9/30 TEE>>> EF 30-35%, hypokinesis of apical myocardium, akinesis of anteroseptal myocardium, mild TR, improved LV function    SIGNIFICANT EVENTS: 9/26 CABG x 4, placement of centrimag L ventricular assistive device  9/30 removal centrimag  10/3 sternal closure 02/14/15: Hg drop overnight by 1 gm and transfusion ordered. Hypotension overnight with increase in precedex.  02/06/15- s/p trach - Tyson Alias  10//11/16 - Doing well post trach  . On milrinone and precedex. Off neo. Weaning but deconditioned (hx as gathered from bedside RN and d/w Dr Laneta Simmers). TMax 19F. CXR worse  02/17/15 - d/w RN - uanble to wean precedex due to agitation. On milrinone and precedex. Very deconditioned.    02/18/2015 : worsening resp mechanics fio2 50%, RR 40 and dysnchrony with worsening cxr. Concern for aspiration of tube feeds overnight. Being made NPO with LIS and starting tPN. Still on precedex and neo. Tracheal aspirate pending . Cardiac interatcing meds right now -> milrinone, precedex, coreg, lasix  02/19/15:significant agitated delirium despite fent gtt   SUBJECTIVE/OVERNIGHT/INTERVAL HX  Good UO with lasix Low gr fever  VITAL SIGNS: Temp:  [99.2 F (37.3 C)-100.4 F (38  C)] 99.6 F (37.6 C) (10/15 1200) Pulse Rate:  [42-136] 91 (10/15 1200) Resp:  [13-31] 18 (10/15 1200) BP: (96-164)/(35-78) 110/50 mmHg (10/15 1200) SpO2:  [88 %-100 %] 100 % (10/15 1200) Arterial Line BP: (75-178)/(36-80) 99/44 mmHg (10/15 1200) FiO2 (%):  [40 %] 40 % (10/15 1239)   HEMODYNAMICS: CVP:  [8 mmHg-36 mmHg] 14 mmHg   VENTILATOR SETTINGS: Vent Mode:  [-] PRVC FiO2 (%):  [40 %] 40 % Set Rate:  [14 bmp] 14 bmp Vt Set:  [370 mL] 370 mL PEEP:  [5 cmH20] 5 cmH20 Plateau Pressure:  [14 cmH20-25 cmH20] 23 cmH20  INTAKE / OUTPUT:  Intake/Output Summary (Last 24 hours) at 02/20/15 1322 Last data filed at 02/20/15 1200  Gross per 24 hour  Intake 3537.48 ml  Output   3590 ml  Net -52.52 ml   PHYSICAL EXAMINATION: General:  Acute on chronic ill appearing female, VERY  DECONDITIONED Neuro: RASS 0 on fentanyl gtt, non focal HEENT:  Moist mucus membranes, ETT. Cardiovascular:  S1, S2, RRR, Lungs:  Clear, no Wheeze or crackles. Abdomen:  Round, soft, non tender, +bs  Musculoskeletal:  Warm and dry, generalized edema   LABS: PULMONARY  Recent Labs Lab 02/18/15 0840 02/18/15 1020  02/18/15 1635  02/18/15 2224 02/19/15 0527 02/19/15 0530 02/19/15 0750 02/19/15 1635 02/20/15 0450 02/20/15 0550  PHART 7.427 7.437  --   --   --   --  7.402  --  7.381  --   --  7.275*  PCO2ART 25.9* 24.3*  --   --   --   --  36.9  --  38.1  --   --  50.2*  PO2ART 72.5* 82.7  --   --   --   --  79.0*  --  91.1  --   --  110*  HCO3 16.8* 16.0*  --  19.2*  --   --  22.7  --  21.9  --   --  22.3  TCO2 17.5 16.7  --  20.2  < > 20 24  --  23.0 21  --  23.8  O2SAT 93.6 95.6  < > 78.4  --   --  95.0 74.1 96.5  --  85.9 97.8  < > = values in this interval not displayed.  CBC  Recent Labs Lab 02/18/15 0450  02/19/15 0525 02/19/15 1635 02/20/15 0600  HGB 9.7*  < > 9.0* 10.2* 8.6*  HCT 29.9*  < > 27.9* 30.0* 28.1*  WBC 14.4*  --  15.9*  --  15.3*  PLT 498*  --  459*  --  419*  < > =  values in this interval not displayed.  COAGULATION No results for input(s): INR in the last 168 hours.  CARDIAC  No results for input(s): TROPONINI in the last 168 hours. No results for input(s): PROBNP in the last 168 hours.   CHEMISTRY  Recent Labs Lab 02/14/15 0409 02/15/15 0416 02/16/15 0445 02/17/15 0415  02/18/15 0450 02/18/15 1646 02/18/15 2224 02/19/15 0525 02/19/15 1635 02/20/15 0600  NA 137 139 141 142  < > 147* 146* 150* 144 148* 142  K 3.5 4.0 3.5 3.7  < > 3.3* 3.1* 3.5 3.9 4.0 3.5  CL 96* 104 107 109  < > 111 109 113* 111 112* 104  CO2 --  20*  --   --  22  --  25  GLUCOSE 221* 163* 166* 185*  < > 129* 259* 92 122* 134* 166*  BUN 65* 65* 58* 47*  < > 56* 51* 57* 67* 65* 74*  CREATININE 1.56* 1.29* 1.09* 0.97  < > 1.28* 1.30* 1.50* 1.54* 1.70* 1.70*  CALCIUM 8.3* 8.5* 8.3* 8.1*  --  8.6*  --   --  8.7*  --  8.4*  MG 2.0 2.1  --   --   --  1.9  --   --  2.4  --  2.2  PHOS 4.9* 4.6  --   --   --  3.9  --   --  4.5  --  6.6*  < > = values in this interval not displayed. Estimated Creatinine Clearance: 24.6 mL/min (by C-G formula based on Cr of 1.7).   LIVER  Recent Labs Lab 02/16/15 0445 02/17/15 0415 02/18/15 0450 02/19/15 0525 02/20/15 0600  AST 38 39 170* 100* 64*  ALT 69* 69* 183* 167* 134*  ALKPHOS 173* 187* 184* 155* 154*  BILITOT 2.0* 1.9* 2.5* 2.2* 2.2*  PROT 5.9* 6.5 7.2 7.0 6.9  ALBUMIN 2.2* 2.4* 2.4* 2.4* 2.3*     INFECTIOUS No results for input(s): LATICACIDVEN, PROCALCITON in the last 168 hours.   ENDOCRINE CBG (last 3)   Recent Labs  02/20/15 0420 02/20/15 0858 02/20/15 1139  GLUCAP 138* 152* 107*         IMAGING x48h  - image(s) personally visualized  -   highlighted in bold Dg Chest Port 1 View  02/20/2015  CLINICAL DATA:  Shortness of breath. EXAM: PORTABLE CHEST 1 VIEW COMPARISON:  1 day prior FINDINGS: Tracheostomy appropriately  positioned. Feeding tube and nasogastric tubes extends beyond the  inferior aspect of the film. Left-sided PICC line terminates at the low SVC. Patient rotated to the right. Right hemidiaphragm elevation. Cardiomegaly accentuated by AP portable technique. No pleural effusion or pneumothorax. Relatively diffuse interstitial and airspace opacities. Similar on the right and slightly progressive on the left. IMPRESSION: Worsened left-sided aeration. Otherwise similar interstitial and airspace disease which could represent pulmonary edema and/or infection. Electronically Signed   By: Jeronimo Greaves M.D.   On: 02/20/2015 09:48   Dg Chest Port 1 View  02/19/2015  CLINICAL DATA:  Mitral valve replacement, shortness of breath EXAM: PORTABLE CHEST 1 VIEW COMPARISON:  02/18/2015 FINDINGS: Endotracheal tube is 4.8 cm above the carina. NG tube and feeding tube extend into the stomach. Postop changes from median sternotomy. Stable cardiomegaly with slight improvement in the asymmetric bilateral airspace disease, but still worse in the right upper lobe. No enlarging effusion or pneumothorax. Left PICC line tip in the lower SVC. IMPRESSION: Minimal improvement in asymmetric patchy bilateral airspace process compatible with bilateral pneumonia versus asymmetric alveolar edema. No enlarging effusion or pneumothorax Stable cardiomegaly Electronically Signed   By: Judie Petit.  Shick M.D.   On: 02/19/2015 08:04        ASSESSMENT / PLAN:  PULMONARY OETT 9/26>>>10/7>>>10/7 > 10/10/'16 TRACH by Tyson Alias Acute on Chronic respiratory failure - multifactorial in setting heart failure, cardiogenic shock and severe deconditioning post multiple surgeries  Vent dysnchrony - some improved  Aspiration pna - HCAP   P:   Trach care Full vent support    CARDIOVASCULAR CVL R IJ cordis 9/26>>>10/2 LUA PICC >>>  CAD s/p CABG  Acute systolic heart failure  Ischemic cardiomyopathy  -EF 35% Cardiogenic shock  Episodes of SVT related to agitation  P:  Per Cards and CVTS - appreciate diuresis Can  taper milrinone based on co-ox Can taper levo gtt   RENAL  Hyponatremia  -resolved AKI - mild but worsening 10/7 with diuresis Hypokalemia   - creat worsening with diuresis.    P:   Replete K - goal > 4 due to ileus Ensure mag > 2 given cardiac issues  F/u chem  Diuresis per cards  GASTROINTESTINAL  Recent Labs Lab 02/16/15 0445 02/17/15 0415 02/18/15 0450 02/19/15 0525 02/20/15 0600  AST 38 39 170* 100* 64*  ALT 69* 69* 183* 167* 134*  ALKPHOS 173* 187* 184* 155* 154*  BILITOT 2.0* 1.9* 2.5* 2.2* 2.2*  PROT 5.9* 6.5 7.2 7.0 6.9  ALBUMIN 2.2* 2.4* 2.4* 2.4* 2.3*    Protein calorie malnutrition  Transaminitis, ? Etiology. At risk shock liver. Not currently on statin or other offending meds  - tube feeds possibly aspirated. New onset transaminitis 02/18/15. Improved 02/19/2015. STarted on TPN 02/18/15   P:   DC tylenol prn due to LFT rise Check LFT  Holding TF per nutrition and CVTS.  TPN per CVTS PPI  HEMATOLOGIC Thrombocytopenia - improved  Anemia - mild P:  F/u CBC Lovenox    INFECTIOUS Wound (Sternum) 10/3>>>NTD Sputum 10/4>>> NTD Open sternum > closed   resp culture  02/17/15 >> ng Blood culture 02/18/15 >>      02/19/15 - Temp 101F  X 36h.  Increased pulm infitlrate . Possible aspiration 02/17/15 v VAP. Culture negative so far  P:   Vancomycin 10/5>>> Ceftaz 10/6>> 10/12, Zosyn 10/12 >>  Can dc vanc  ENDOCRINE Hyperglycemia  P:   SSI  NEUROLOGIC Worsening vent dysnchrony 02/18/15 Ongoing agitated delirium -  P:   Fent gtt since 02/18/15 to continue Seroquel QHs increase 100mg  qhs - aim to slowly uptirate depending on coruse Add low dose clonazepam 0.5 bid Use  haldol 1mg  prn to 5mg  Q6h prn -monitor qTc   RASS goal: 0  DC xanaz qhs Dc Dilaudid prn    FAMILY  - Updates:  Per TCTS   Summary - Agitated delerium, shock & aspiration precluding weaning  The patient is critically ill with multiple organ systems  failure and requires high complexity decision making for assessment and support, frequent evaluation and titration of therapies, application of advanced monitoring technologies and extensive interpretation of multiple databases. Critical Care Time devoted to patient care services described in this note independent of APP time is 32 minutes.   Cyril Mourningakesh Alva MD. Tonny BollmanFCCP. [facility] Pager [phone] If no response call [phone]      02/20/2015 1:22 PM

## 2015-02-21 ENCOUNTER — Inpatient Hospital Stay (HOSPITAL_COMMUNITY): Payer: Medicaid Other

## 2015-02-21 LAB — COMPREHENSIVE METABOLIC PANEL
ALT: 126 U/L — ABNORMAL HIGH (ref 14–54)
AST: 80 U/L — ABNORMAL HIGH (ref 15–41)
Albumin: 2 g/dL — ABNORMAL LOW (ref 3.5–5.0)
Alkaline Phosphatase: 151 U/L — ABNORMAL HIGH (ref 38–126)
Anion gap: 14 (ref 5–15)
BUN: 81 mg/dL — ABNORMAL HIGH (ref 6–20)
CO2: 25 mmol/L (ref 22–32)
Calcium: 8 mg/dL — ABNORMAL LOW (ref 8.9–10.3)
Chloride: 101 mmol/L (ref 101–111)
Creatinine, Ser: 1.72 mg/dL — ABNORMAL HIGH (ref 0.44–1.00)
GFR calc Af Amer: 36 mL/min — ABNORMAL LOW (ref >60)
GFR calc non Af Amer: 31 mL/min — ABNORMAL LOW (ref >60)
Glucose, Bld: 153 mg/dL — ABNORMAL HIGH (ref 65–99)
Potassium: 2.4 mmol/L — CL (ref 3.5–5.1)
Sodium: 140 mmol/L (ref 135–145)
Total Bilirubin: 3.3 mg/dL — ABNORMAL HIGH (ref 0.3–1.2)
Total Protein: 6.4 g/dL — ABNORMAL LOW (ref 6.5–8.1)

## 2015-02-21 LAB — BASIC METABOLIC PANEL
ANION GAP: 11 (ref 5–15)
Anion gap: 12 (ref 5–15)
Anion gap: 12 (ref 5–15)
BUN: 82 mg/dL — ABNORMAL HIGH (ref 6–20)
BUN: 81 mg/dL — ABNORMAL HIGH (ref 6–20)
BUN: 78 mg/dL — ABNORMAL HIGH (ref 6–20)
CALCIUM: 8 mg/dL — AB (ref 8.9–10.3)
CALCIUM: 8.2 mg/dL — AB (ref 8.9–10.3)
CHLORIDE: 102 mmol/L (ref 101–111)
CO2: 26 mmol/L (ref 22–32)
CO2: 24 mmol/L (ref 22–32)
CO2: 25 mmol/L (ref 22–32)
CREATININE: 1.78 mg/dL — AB (ref 0.44–1.00)
Calcium: 8.3 mg/dL — ABNORMAL LOW (ref 8.9–10.3)
Chloride: 101 mmol/L (ref 101–111)
Chloride: 101 mmol/L (ref 101–111)
Creatinine, Ser: 1.75 mg/dL — ABNORMAL HIGH (ref 0.44–1.00)
Creatinine, Ser: 1.68 mg/dL — ABNORMAL HIGH (ref 0.44–1.00)
GFR calc Af Amer: 35 mL/min — ABNORMAL LOW (ref >60)
GFR calc Af Amer: 37 mL/min — ABNORMAL LOW (ref >60)
GFR calc non Af Amer: 29 mL/min — ABNORMAL LOW (ref >60)
GFR, EST AFRICAN AMERICAN: 34 mL/min — AB (ref >60)
GFR, EST NON AFRICAN AMERICAN: 30 mL/min — AB (ref >60)
GFR, EST NON AFRICAN AMERICAN: 32 mL/min — AB (ref >60)
GLUCOSE: 121 mg/dL — AB (ref 65–99)
Glucose, Bld: 198 mg/dL — ABNORMAL HIGH (ref 65–99)
Glucose, Bld: 165 mg/dL — ABNORMAL HIGH (ref 65–99)
POTASSIUM: 2.7 mmol/L — AB (ref 3.5–5.1)
POTASSIUM: 3.2 mmol/L — AB (ref 3.5–5.1)
Potassium: 3.4 mmol/L — ABNORMAL LOW (ref 3.5–5.1)
SODIUM: 139 mmol/L (ref 135–145)
SODIUM: 138 mmol/L (ref 135–145)
Sodium: 137 mmol/L (ref 135–145)

## 2015-02-21 LAB — CARBOXYHEMOGLOBIN
CARBOXYHEMOGLOBIN: 2.1 % — AB (ref 0.5–1.5)
Carboxyhemoglobin: 2.1 % — ABNORMAL HIGH (ref 0.5–1.5)
METHEMOGLOBIN: 0.9 % (ref 0.0–1.5)
Methemoglobin: 0.8 % (ref 0.0–1.5)
O2 SAT: 78.2 %
O2 Saturation: 71.5 %
Total hemoglobin: 8.3 g/dL — ABNORMAL LOW (ref 12.0–16.0)
Total hemoglobin: 8.2 g/dL — ABNORMAL LOW (ref 12.0–16.0)

## 2015-02-21 LAB — BLOOD GAS, ARTERIAL
Acid-base deficit: 1.3 mmol/L (ref 0.0–2.0)
Bicarbonate: 23.2 mEq/L (ref 20.0–24.0)
FIO2: 0.4
MECHVT: 370 mL
O2 Saturation: 98.7 %
PEEP: 5 cmH2O
Patient temperature: 98.6
RATE: 14 resp/min
TCO2: 24.5 mmol/L (ref 0–100)
pCO2 arterial: 41.4 mmHg (ref 35.0–45.0)
pH, Arterial: 7.368 (ref 7.350–7.450)
pO2, Arterial: 131 mmHg — ABNORMAL HIGH (ref 80.0–100.0)

## 2015-02-21 LAB — CBC
HCT: 26.3 % — ABNORMAL LOW (ref 36.0–46.0)
Hemoglobin: 8.1 g/dL — ABNORMAL LOW (ref 12.0–15.0)
MCH: 27.4 pg (ref 26.0–34.0)
MCHC: 30.8 g/dL (ref 30.0–36.0)
MCV: 88.9 fL (ref 78.0–100.0)
Platelets: 357 10*3/uL (ref 150–400)
RBC: 2.96 MIL/uL — ABNORMAL LOW (ref 3.87–5.11)
RDW: 18 % — ABNORMAL HIGH (ref 11.5–15.5)
WBC: 11.4 10*3/uL — ABNORMAL HIGH (ref 4.0–10.5)

## 2015-02-21 LAB — GLUCOSE, CAPILLARY
GLUCOSE-CAPILLARY: 131 mg/dL — AB (ref 65–99)
GLUCOSE-CAPILLARY: 114 mg/dL — AB (ref 65–99)
Glucose-Capillary: 163 mg/dL — ABNORMAL HIGH (ref 65–99)
Glucose-Capillary: 111 mg/dL — ABNORMAL HIGH (ref 65–99)
Glucose-Capillary: 183 mg/dL — ABNORMAL HIGH (ref 65–99)

## 2015-02-21 LAB — MAGNESIUM: MAGNESIUM: 1.7 mg/dL (ref 1.7–2.4)

## 2015-02-21 LAB — PHOSPHORUS: PHOSPHORUS: 4.6 mg/dL (ref 2.5–4.6)

## 2015-02-21 MED ORDER — POTASSIUM CHLORIDE 10 MEQ/50ML IV SOLN
10.0000 meq | INTRAVENOUS | Status: AC | PRN
  Administered 2015-02-24 (×3): 10 meq via INTRAVENOUS
  Filled 2015-02-21 (×9): qty 50

## 2015-02-21 MED ORDER — MAGNESIUM SULFATE 2 GM/50ML IV SOLN
2.0000 g | Freq: Once | INTRAVENOUS | Status: AC
  Administered 2015-02-21: 2 g via INTRAVENOUS
  Filled 2015-02-21: qty 50

## 2015-02-21 MED ORDER — POTASSIUM CHLORIDE 10 MEQ/50ML IV SOLN
10.0000 meq | INTRAVENOUS | Status: AC
  Administered 2015-02-21 – 2015-02-22 (×4): 10 meq via INTRAVENOUS
  Filled 2015-02-21 (×4): qty 50

## 2015-02-21 MED ORDER — POTASSIUM CHLORIDE 10 MEQ/50ML IV SOLN
10.0000 meq | INTRAVENOUS | Status: AC
  Administered 2015-02-21 (×4): 10 meq via INTRAVENOUS

## 2015-02-21 MED ORDER — POTASSIUM CHLORIDE 10 MEQ/50ML IV SOLN
10.0000 meq | INTRAVENOUS | Status: AC
  Administered 2015-02-21 (×6): 10 meq via INTRAVENOUS
  Filled 2015-02-21: qty 50

## 2015-02-21 MED ORDER — M.V.I. ADULT IV INJ
INTRAVENOUS | Status: AC
  Administered 2015-02-21: 18:00:00 via INTRAVENOUS
  Filled 2015-02-21: qty 1800

## 2015-02-21 MED ORDER — POTASSIUM CHLORIDE 20 MEQ/15ML (10%) PO SOLN
40.0000 meq | Freq: Once | ORAL | Status: AC
  Administered 2015-02-21: 40 meq
  Filled 2015-02-21: qty 30

## 2015-02-21 MED ORDER — M.V.I. ADULT IV INJ
INTRAVENOUS | Status: DC
  Filled 2015-02-21: qty 1800

## 2015-02-21 NOTE — Progress Notes (Signed)
      [address]       [place] [id number]             [phone]       Agitated at present  BP 136/63 mmHg  Pulse 115  Temp(Src) 98.9 F (37.2 C) (Core (Comment))  Resp 27  Ht 5' (1.524 m)  Wt 110 lb 3.7 oz (50 kg)  BMI 21.53 kg/m2  SpO2 98%   Intake/Output Summary (Last 24 hours) at 02/21/15 1729 Last data filed at 02/21/15 1700  Gross per 24 hour  Intake 3179.78 ml  Output   4975 ml  Net -1795.22 ml   Was unable to wean due to tachypnea  Hypokalemia persists- continue to supplement until corrected.  Salvatore DecentSteven C. Dorris FetchHendrickson, MD [facility] [phone]

## 2015-02-21 NOTE — Progress Notes (Signed)
PULMONARY / CRITICAL CARE MEDICINE   Name: Breanna Alvarez MRN: [id number] DOB: 06-08-1950    ADMISSION DATE:  01/26/2015 CONSULTATION DATE:  10/5  REFERRING MD :  Donata Clay   CHIEF COMPLAINT:  Vent management   INITIAL PRESENTATION: 63yo female never smoker with hx HTN, CAD previously refused CABG ultimately admitted 9/20 for CVTS eval and CABG after ongoing chest pain.  She underwent 4V CABG 9/26 and has had complicated course with cardiogenic shock s/p centrimag placement and removal, ischemic cardiomyopathy with EF 20%, open sternum now s/p sternal closure 10/3.  She remains intubated since initial CABG 9/26 with poor weaning and PCCM now consulted to assist.   STUDIES:  9/21 2D echo>> EF 20-25%, mod MR, PA press 51 mmHg 9/30 TEE>>> EF 30-35%, hypokinesis of apical myocardium, akinesis of anteroseptal myocardium, mild TR, improved LV function    SIGNIFICANT EVENTS: 9/26 CABG x 4, placement of centrimag L ventricular assistive device  9/30 removal centrimag  10/3 sternal closure 02/14/15: Hg drop overnight by 1 gm and transfusion ordered. Hypotension overnight with increase in precedex.  02/06/15- s/p trach - Tyson Alias  10//11/16 - Doing well post trach  . On milrinone and precedex. Off neo. Weaning but deconditioned (hx as gathered from bedside RN and d/w Dr Laneta Simmers). TMax 42F. CXR worse  02/17/15 - d/w RN - uanble to wean precedex due to agitation. On milrinone and precedex. Very deconditioned.    02/18/2015 : worsening resp mechanics fio2 50%, RR 40 and dysnchrony with worsening cxr. Concern for aspiration of tube feeds overnight. Being made NPO with LIS and starting tPN. Still on precedex and neo. Tracheal aspirate pending . Cardiac interatcing meds right now -> milrinone, precedex, coreg, lasix  02/19/15:significant agitated delirium despite fent gtt   SUBJECTIVE/OVERNIGHT/INTERVAL HX Good UO with lasix Low gr fever Blank stare, no obvious pain  VITAL SIGNS: Temp:   [98.8 F (37.1 C)-100.4 F (38 C)] 99.4 F (37.4 C) (10/16 0700) Pulse Rate:  [91-136] 103 (10/16 0700) Resp:  [13-31] 17 (10/16 0700) BP: (91-151)/(35-88) 96/37 mmHg (10/16 0700) SpO2:  [98 %-100 %] 100 % (10/16 0700) Arterial Line BP: (86-157)/(38-64) 90/40 mmHg (10/16 0700) FiO2 (%):  [40 %] 40 % (10/16 0700) Weight:  [50 kg (110 lb 3.7 oz)] 50 kg (110 lb 3.7 oz) (10/16 0600)   HEMODYNAMICS: CVP:  [9 mmHg-20 mmHg] 13 mmHg   VENTILATOR SETTINGS: Vent Mode:  [-] PRVC FiO2 (%):  [40 %] 40 % Set Rate:  [14 bmp] 14 bmp Vt Set:  [370 mL] 370 mL PEEP:  [5 cmH20] 5 cmH20 Plateau Pressure:  [18 cmH20-29 cmH20] 18 cmH20  INTAKE / OUTPUT:  Intake/Output Summary (Last 24 hours) at 02/21/15 0721 Last data filed at 02/21/15 0700  Gross per 24 hour  Intake 3694.43 ml  Output   4575 ml  Net -880.57 ml   PHYSICAL EXAMINATION: General:  Acute on chronic ill appearing female, deconditioned Neuro: RASS 0 on fentanyl gtt, non focal HEENT:  Moist mucus membranes, ETT. Cardiovascular:  S1, S2, RRR, Lungs:  Clear, no Wheeze or crackles. Abdomen:  Round, soft, non tender, +bs  Musculoskeletal:  Warm and dry, generalized edema   LABS: PULMONARY  Recent Labs Lab 02/18/15 1020  02/18/15 1635  02/19/15 0527 02/19/15 0530 02/19/15 0750 02/19/15 1635 02/20/15 0450 02/20/15 0550 02/21/15 0430  PHART 7.437  --   --   --  7.402  --  7.381  --   --  7.275* 7.368  PCO2ART 24.3*  --   --   --  36.9  --  38.1  --   --  50.2* 41.4  PO2ART 82.7  --   --   --  79.0*  --  91.1  --   --  110* 131*  HCO3 16.0*  --  19.2*  --  22.7  --  21.9  --   --  22.3 23.2  TCO2 16.7  --  20.2  < > 24  --  23.0 21  --  23.8 24.5  O2SAT 95.6  < > 78.4  --  95.0 74.1 96.5  --  85.9 97.8 71.5  98.7  < > = values in this interval not displayed.  CBC  Recent Labs Lab 02/19/15 0525 02/19/15 1635 02/20/15 0600 02/21/15 0426  HGB 9.0* 10.2* 8.6* 8.1*  HCT 27.9* 30.0* 28.1* 26.3*  WBC 15.9*  --  15.3*  11.4*  PLT 459*  --  419* 357    COAGULATION No results for input(s): INR in the last 168 hours.  CARDIAC  No results for input(s): TROPONINI in the last 168 hours. No results for input(s): PROBNP in the last 168 hours.   CHEMISTRY  Recent Labs Lab 02/15/15 0416  02/17/15 0415  02/18/15 0450  02/18/15 2224 02/19/15 0525 02/19/15 1635 02/20/15 0600 02/21/15 0426  NA 139  < > 142  < > 147*  < > 150* 144 148* 142 140  K 4.0  < > 3.7  < > 3.3*  < > 3.5 3.9 4.0 3.5 2.4*  CL 104  < > 109  < > 111  < > 113* 111 112* 104 101  CO2 23  < > 25  --  20*  --   --  22  --  25 25  GLUCOSE 163*  < > 185*  < > 129*  < > 92 122* 134* 166* 153*  BUN 65*  < > 47*  < > 56*  < > 57* 67* 65* 74* 81*  CREATININE 1.29*  < > 0.97  < > 1.28*  < > 1.50* 1.54* 1.70* 1.70* 1.72*  CALCIUM 8.5*  < > 8.1*  --  8.6*  --   --  8.7*  --  8.4* 8.0*  MG 2.1  --   --   --  1.9  --   --  2.4  --  2.2 1.7  PHOS 4.6  --   --   --  3.9  --   --  4.5  --  6.6* 4.6  < > = values in this interval not displayed. Estimated Creatinine Clearance: 24.4 mL/min (by C-G formula based on Cr of 1.72).   LIVER  Recent Labs Lab 02/17/15 0415 02/18/15 0450 02/19/15 0525 02/20/15 0600 02/21/15 0426  AST 39 170* 100* 64* 80*  ALT 69* 183* 167* 134* 126*  ALKPHOS 187* 184* 155* 154* 151*  BILITOT 1.9* 2.5* 2.2* 2.2* 3.3*  PROT 6.5 7.2 7.0 6.9 6.4*  ALBUMIN 2.4* 2.4* 2.4* 2.3* 2.0*     INFECTIOUS No results for input(s): LATICACIDVEN, PROCALCITON in the last 168 hours.   ENDOCRINE CBG (last 3)   Recent Labs  02/20/15 1937 02/20/15 2332 02/21/15 0346  GLUCAP 111* 161* 131*         IMAGING x48h  - image(s) personally visualized  -   highlighted in bold Dg Chest Port 1 View  02/20/2015  CLINICAL DATA:  Shortness of breath. EXAM: PORTABLE CHEST 1 VIEW COMPARISON:  1 day prior FINDINGS: Tracheostomy appropriately positioned. Feeding  tube and nasogastric tubes extends beyond the inferior aspect of the  film. Left-sided PICC line terminates at the low SVC. Patient rotated to the right. Right hemidiaphragm elevation. Cardiomegaly accentuated by AP portable technique. No pleural effusion or pneumothorax. Relatively diffuse interstitial and airspace opacities. Similar on the right and slightly progressive on the left. IMPRESSION: Worsened left-sided aeration. Otherwise similar interstitial and airspace disease which could represent pulmonary edema and/or infection. Electronically Signed   By: Jeronimo GreavesKyle  Talbot M.D.   On: 02/20/2015 09:48        ASSESSMENT / PLAN:  PULMONARY OETT 9/26>>>10/7>>>10/7 > 10/10/'16 TRACH by Tyson AliasFeinstein Acute on Chronic respiratory failure - multifactorial in setting heart failure, cardiogenic shock and severe deconditioning post multiple surgeries  Vent dysnchrony - some improved  Aspiration pna - HCAP  P:   Trach care Start SBTs    CARDIOVASCULAR CVL R IJ cordis 9/26>>>10/2 LUA PICC >>>  CAD s/p CABG  Acute systolic heart failure  Ischemic cardiomyopathy  -EF 35% Cardiogenic shock  Episodes of SVT related to agitation  P:  Per Cards and CVTS - appreciate diuresis Can taper milrinone based on co-ox Can taper levo gtt to off   RENAL  Hyponatremia  -resolved AKI - mild but worsening 10/7 with diuresis Hypokalemia  - creat worsening with diuresis.    P:   Replete K - goal > 4 due to ileus Ensure mag > 2 given cardiac issues  Diuresis per cards  GASTROINTESTINAL  Recent Labs Lab 02/17/15 0415 02/18/15 0450 02/19/15 0525 02/20/15 0600 02/21/15 0426  AST 39 170* 100* 64* 80*  ALT 69* 183* 167* 134* 126*  ALKPHOS 187* 184* 155* 154* 151*  BILITOT 1.9* 2.5* 2.2* 2.2* 3.3*  PROT 6.5 7.2 7.0 6.9 6.4*  ALBUMIN 2.4* 2.4* 2.4* 2.3* 2.0*    Protein calorie malnutrition  Transaminitis, ? Etiology. At risk shock liver. Not currently on statin or other offending meds  - tube feeds possibly aspirated. New onset transaminitis 02/18/15. Improved  02/19/2015. STarted on TPN 02/18/15   P:   DC tylenol prn due to LFT rise follow LFT  Prefer TFs but on TPN per CVTS PPI  HEMATOLOGIC Thrombocytopenia - improved  Anemia - mild P:  F/u CBC Lovenox    INFECTIOUS Wound (Sternum) 10/3>>>NTD Sputum 10/4>>> NTD Open sternum > closed   resp culture  02/17/15 >> ng Blood culture 02/18/15 >>     02/19/15 - Temp 101F  X 36h.  Increased pulm infitlrate . Possible aspiration 02/17/15 v VAP. Culture negative so far  P:   Vancomycin 10/5>>> Ceftaz 10/6>> 10/12, Zosyn 10/12 >>  Can dc vanc  ENDOCRINE Hyperglycemia  P:   SSI  NEUROLOGIC Worsening vent dysnchrony 02/18/15 Ongoing agitated delirium -    P:   Fent gtt since 02/18/15 to continue Seroquel QHs increased 100mg  qhs - aim to slowly uptirate depending on coruse Add low dose clonazepam 0.5 bid 10/16 Use  haldol 1mg  prn to 5mg  Q6h prn -monitor qTc  RASS goal: 0  DC xanaz qhs Dc Dilaudid prn    FAMILY  - Updates:  Per TCTS   Summary - Agitated delerium, shock & aspiration precluding weaning, will start ow  The patient is critically ill with multiple organ systems failure and requires high complexity decision making for assessment and support, frequent evaluation and titration of therapies, application of advanced monitoring technologies and extensive interpretation of multiple databases. Critical Care Time devoted to patient care services described in this note independent of APP  time is 32 minutes.   Cyril Mourning MD. Tonny Bollman. [facility] Pager [phone] If no response call [phone]    02/21/2015 7:21 AM

## 2015-02-21 NOTE — Progress Notes (Signed)
13 Days Post-Op Procedure(s) (LRB): STERNAL CLOSURE (N/A) TRANSESOPHAGEAL ECHOCARDIOGRAM (TEE) (N/A) Subjective: More alert today In good spirits, smiling  Objective: Vital signs in last 24 hours: Temp:  [98.8 F (37.1 C)-100.2 F (37.9 C)] 99.2 F (37.3 C) (10/16 0830) Pulse Rate:  [91-122] 122 (10/16 0830) Cardiac Rhythm:  [-] Sinus tachycardia (10/16 0800) Resp:  [13-31] 26 (10/16 0830) BP: (91-151)/(36-88) 105/52 mmHg (10/16 0800) SpO2:  [98 %-100 %] 99 % (10/16 0830) Arterial Line BP: (80-156)/(38-66) 156/66 mmHg (10/16 0830) FiO2 (%):  [40 %] 40 % (10/16 0824) Weight:  [110 lb 3.7 oz (50 kg)] 110 lb 3.7 oz (50 kg) (10/16 0600)  Hemodynamic parameters for last 24 hours: CVP:  [9 mmHg-20 mmHg] 20 mmHg  Intake/Output from previous day: 10/15 0701 - 10/16 0700 In: 3694.4 [I.V.:1144.4; NG/GT:230; IV Piggyback:300; TPN:2020] Out: 4575 [Urine:4375; Emesis/NG output:200] Intake/Output this shift:    General appearance: alert, cooperative and no distress Neurologic: intact Heart: tachy, regular Lungs: bronchophony bilaterally Abdomen: normal findings: soft, non-tender Wound: clean and dry  Lab Results:  Recent Labs  02/20/15 0600 02/21/15 0426  WBC 15.3* 11.4*  HGB 8.6* 8.1*  HCT 28.1* 26.3*  PLT 419* 357   BMET:  Recent Labs  02/20/15 0600 02/21/15 0426  NA 142 140  K 3.5 2.4*  CL 104 101  CO2 25 25  GLUCOSE 166* 153*  BUN 74* 81*  CREATININE 1.70* 1.72*  CALCIUM 8.4* 8.0*    PT/INR: No results for input(s): LABPROT, INR in the last 72 hours. ABG    Component Value Date/Time   PHART 7.368 02/21/2015 0430   HCO3 23.2 02/21/2015 0430   TCO2 24.5 02/21/2015 0430   ACIDBASEDEF 1.3 02/21/2015 0430   O2SAT 71.5 02/21/2015 0430   O2SAT 98.7 02/21/2015 0430   CBG (last 3)   Recent Labs  02/20/15 2332 02/21/15 0346 02/21/15 0804  GLUCAP 161* 131* 114*    Assessment/Plan: S/P Procedure(s) (LRB): STERNAL CLOSURE (N/A) TRANSESOPHAGEAL  ECHOCARDIOGRAM (TEE) (N/A) -  Looks better today Chest xray shows improved aeration on left Vent per CCM On TNA until tomorrow- will check KUB in AM to make sure panda in place Hypokalemia being corrected. Lasix on hold for now   LOS: 26 days    Breanna Alvarez 02/21/2015

## 2015-02-21 NOTE — Progress Notes (Addendum)
CRITICAL VALUE ALERT  Critical value received: K 2.4  Date of notification:  02-21-15  Time of notification:  0520  Critical value read back:Yes.    Nurse who received alert:  Ernie HewJohn Perrin  MD notified (1st page): Thornell MuleElink Nestor MD

## 2015-02-21 NOTE — Progress Notes (Signed)
eLink Physician-Brief Progress Note Patient Name: Breanna Alvarez DOB: Feb 14, 1951 MRN: [id number]   Date of Service  02/21/2015  HPI/Events of Note  Hypokalemia. Note reviewed & NPO.  eICU Interventions  KCl 60 mEq IV. Repeat BMP @ 1300     Intervention Category Intermediate Interventions: Electrolyte abnormality - evaluation and management  Breanna Alvarez 02/21/2015, 6:09 AM

## 2015-02-21 NOTE — Progress Notes (Signed)
Subjective: Trach  Sedated   Objective: Filed Vitals:   02/21/15 0745 02/21/15 0800 02/21/15 0815 02/21/15 0830  BP:  105/52    Pulse: 103 106 108 122  Temp: 99.3 F (37.4 C) 99.4 F (37.4 C) 99.4 F (37.4 C) 99.2 F (37.3 C)  TempSrc:      Resp: Height:      Weight:      SpO2: 100% 100% 100% 99%   Weight change:   Intake/Output Summary (Last 24 hours) at 02/21/15 1002 Last data filed at 02/21/15 0700  Gross per 24 hour  Intake 3169.23 ml  Output   3950 ml  Net -780.77 ml   Net  I/O  -3.4 L    General:Awake  Sedated.  Trach   Neck:  S/p trach  Difficult to assess Heart: Regular rate and rhythm, without murmurs, rubs, gallops.  Lungs: rhonchi   Exemities:  No edema.    Tele:  ST  Currently in 110s   Lab Results: Results for orders placed or performed during the hospital encounter of 01/26/15 (from the past 24 hour(s))  Glucose, capillary     Status: Abnormal   Collection Time: 02/20/15 11:39 AM  Result Value Ref Range   Glucose-Capillary 107 (H) 65 - 99 mg/dL   Comment 1 Notify RN   Glucose, capillary     Status: Abnormal   Collection Time: 02/20/15  3:51 PM  Result Value Ref Range   Glucose-Capillary 140 (H) 65 - 99 mg/dL   Comment 1 Capillary Specimen   Glucose, capillary     Status: Abnormal   Collection Time: 02/20/15  7:37 PM  Result Value Ref Range   Glucose-Capillary 111 (H) 65 - 99 mg/dL   Comment 1 Capillary Specimen   Glucose, capillary     Status: Abnormal   Collection Time: 02/20/15 11:32 PM  Result Value Ref Range   Glucose-Capillary 161 (H) 65 - 99 mg/dL   Comment 1 Capillary Specimen   Glucose, capillary     Status: Abnormal   Collection Time: 02/21/15  3:46 AM  Result Value Ref Range   Glucose-Capillary 131 (H) 65 - 99 mg/dL   Comment 1 Capillary Specimen   Magnesium     Status: None   Collection Time: 02/21/15  4:26 AM  Result Value Ref Range   Magnesium 1.7 1.7 - 2.4 mg/dL  Phosphorus     Status: None   Collection  Time: 02/21/15  4:26 AM  Result Value Ref Range   Phosphorus 4.6 2.5 - 4.6 mg/dL  CBC     Status: Abnormal   Collection Time: 02/21/15  4:26 AM  Result Value Ref Range   WBC 11.4 (H) 4.0 - 10.5 K/uL   RBC 2.96 (L) 3.87 - 5.11 MIL/uL   Hemoglobin 8.1 (L) 12.0 - 15.0 g/dL   HCT 16.1 (L) 09.6 - 04.5 %   MCV 88.9 78.0 - 100.0 fL   MCH 27.4 26.0 - 34.0 pg   MCHC 30.8 30.0 - 36.0 g/dL   RDW 40.9 (H) 81.1 - 91.4 %   Platelets 357 150 - 400 K/uL  Comprehensive metabolic panel     Status: Abnormal   Collection Time: 02/21/15  4:26 AM  Result Value Ref Range   Sodium 140 135 - 145 mmol/L   Potassium 2.4 (LL) 3.5 - 5.1 mmol/L   Chloride 101 101 - 111 mmol/L   CO2 25 22 - 32 mmol/L   Glucose, Bld 153 (  H) 65 - 99 mg/dL   BUN 81 (H) 6 - 20 mg/dL   Creatinine, Ser 1.611.72 (H) 0.44 - 1.00 mg/dL   Calcium 8.0 (L) 8.9 - 10.3 mg/dL   Total Protein 6.4 (L) 6.5 - 8.1 g/dL   Albumin 2.0 (L) 3.5 - 5.0 g/dL   AST 80 (H) 15 - 41 U/L   ALT 126 (H) 14 - 54 U/L   Alkaline Phosphatase 151 (H) 38 - 126 U/L   Total Bilirubin 3.3 (H) 0.3 - 1.2 mg/dL   GFR calc non Af Amer 31 (L) >60 mL/min   GFR calc Af Amer 36 (L) >60 mL/min   Anion gap 14 5 - 15  Carboxyhemoglobin     Status: Abnormal   Collection Time: 02/21/15  4:30 AM  Result Value Ref Range   Total hemoglobin 8.3 (L) 12.0 - 16.0 g/dL   O2 Saturation 09.671.5 %   Carboxyhemoglobin 2.1 (H) 0.5 - 1.5 %   Methemoglobin 0.8 0.0 - 1.5 %  Blood gas, arterial     Status: Abnormal   Collection Time: 02/21/15  4:30 AM  Result Value Ref Range   FIO2 0.40    Delivery systems VENTILATOR    Mode PRESSURE REGULATED VOLUME CONTROL    VT 370 mL   LHR 14 resp/min   Peep/cpap 5.0 cm H20   pH, Arterial 7.368 7.350 - 7.450   pCO2 arterial 41.4 35.0 - 45.0 mmHg   pO2, Arterial 131 (H) 80.0 - 100.0 mmHg   Bicarbonate 23.2 20.0 - 24.0 mEq/L   TCO2 24.5 0 - 100 mmol/L   Acid-base deficit 1.3 0.0 - 2.0 mmol/L   O2 Saturation 98.7 %   Patient temperature 98.6     Collection site A-LINE    Drawn by COLLECTED BY NURSE    Sample type ARTERIAL    Allens test (pass/fail) NOT INDICATED (A) PASS  Glucose, capillary     Status: Abnormal   Collection Time: 02/21/15  8:04 AM  Result Value Ref Range   Glucose-Capillary 114 (H) 65 - 99 mg/dL   Comment 1 Capillary Specimen     Studies/Results: Dg Chest Port 1 View  02/21/2015  CLINICAL DATA:  Respiratory distress EXAM: PORTABLE CHEST 1 VIEW COMPARISON:  Yesterday FINDINGS: Tubular device is stable. Extensive airspace disease throughout the left lung has improved. Airspace disease throughout the right lung with an upper lobe distribution is stable. No pneumothorax. Mild cardiomegaly. IMPRESSION: Bilateral airspace disease is improved on the left but stable on the right. Electronically Signed   By: Jolaine ClickArthur  Hoss M.D.   On: 02/21/2015 08:46    Medications: Reviewed    @PROBHOSP @  1  CAD  S/p CABG 3/26  Cetrimag removed 9.30  2  Acute on chronic systolic CHF  Currently on milrinone  Would hold lasix tonight as Cr has bumped.  I would keep on milrinone for now.  Follow Co-0x    Labs this PM    LOS: 26 days   Dietrich PatesPaula Arlan Birks 02/21/2015, 10:02 AM

## 2015-02-21 NOTE — Progress Notes (Addendum)
PARENTERAL NUTRITION CONSULT NOTE  Pharmacy Consult for TPN Indication: Prolonged ileus  Allergies  Allergen Reactions  . Atorvastatin Other (See Comments)    Gas, chest tightness  . Crestor [Rosuvastatin Calcium] Other (See Comments)    Muscle aches   Patient Measurements: Height: 5' (152.4 cm) Weight: 110 lb 3.7 oz (50 kg) IBW/kg (Calculated) : 45.5  Usual Weight: ~100-106 pounds (46-48 kg)  Vital Signs: Temp: 99.4 F (37.4 C) (10/16 0700) Temp Source: Core (Comment) (10/16 0400) BP: 96/37 mmHg (10/16 0700) Pulse Rate: 103 (10/16 0700) Intake/Output from previous day: 10/15 0701 - 10/16 0700 In: 3694.4 [I.V.:1144.4; NG/GT:230; IV Piggyback:300; TPN:2020] Out: 4575 [Urine:4375; Emesis/NG output:200] Intake/Output from this shift:   Labs:  Recent Labs  02/19/15 0525 02/19/15 1635 02/20/15 0600 02/21/15 0426  WBC 15.9*  --  15.3* 11.4*  HGB 9.0* 10.2* 8.6* 8.1*  HCT 27.9* 30.0* 28.1* 26.3*  PLT 459*  --  419* 357    Recent Labs  02/19/15 0525 02/19/15 1635 02/20/15 0600 02/21/15 0426  NA 144 148* 142 140  K 3.9 4.0 3.5 2.4*  CL 111 112* 104 101  CO2 22  --  25 25  GLUCOSE 122* 134* 166* 153*  BUN 67* 65* 74* 81*  CREATININE 1.54* 1.70* 1.70* 1.72*  CALCIUM 8.7*  --  8.4* 8.0*  MG 2.4  --  2.2 1.7  PHOS 4.5  --  6.6* 4.6  PROT 7.0  --  6.9 6.4*  ALBUMIN 2.4*  --  2.3* 2.0*  AST 100*  --  64* 80*  ALT 167*  --  134* 126*  ALKPHOS 155*  --  154* 151*  BILITOT 2.2*  --  2.2* 3.3*  TRIG 125  --   --   --    Estimated Creatinine Clearance: 24.4 mL/min (by C-G formula based on Cr of 1.72).   Recent Labs  02/20/15 1937 02/20/15 2332 02/21/15 0346  GLUCAP 111* 161* 131*   Medical History: Past Medical History  Diagnosis Date  . Hyperlipidemia   . Acute systolic CHF (congestive heart failure), NYHA class 3 (HCC) 07/10/2014  . Hypertension    Medications:  Infusions:  . dextrose 10 mL/hr at 02/21/15 0700  . fentaNYL infusion INTRAVENOUS 250  mcg/hr (02/21/15 0700)  . milrinone 0.25 mcg/kg/min (02/21/15 0700)  . norepinephrine (LEVOPHED) Adult infusion 1 mcg/min (02/21/15 0700)  . TPN (CLINIMIX) Adult without lytes 75 mL/hr at 02/21/15 0700   Admit:  64 yo female with cardiac problems and recent CABG on 9/26 and Centrimag removal on 9/30 with an open sternum and plans for return to OR 10/3. Complicated post-op course.  Surgeries/Procedures: S/p CABG x4, TEE, Removal of CVAD, Sternal closure  Insulin Requirements in the past 24 hours:  Novolog 18 units/24h  Current Nutrition:  Clinimix  5/15 at 75 ml/hr and IVFA 20% at 10 ml/hr on MWF   Nutritional Goals: Per RD assessment 10/14 1417 kcal  85-95 g protein  GI: Post-op ileus,  Albumin low at 2.4. Prealbumin low at 6.6 (10/3). LBM 10/13. Previously on TPN 10/1 > 10/6, was transitioned to TF, these were turned off 10/13 due to possible aspiration. On ppi   Endo: cbgs 114-166 on ssi, pt previously required 20-40 units of insulin in TPN. Monitor glucose  Lytes: K 3.5 > 2.4 (replacing), Mg 2.2 > 1.7, Phos 6.6 > 4.6, CoCa 9.6, Ca x Phos: 44  Renal: SCr 1 > 1.7, eCrCl 25-30 ml/min, Good UOP. D5W @ 10 ml/hr  Pulm:  s/p trach 10/10, difficult time weaning from vent, MD notes worsening cxr  Cards: HTN, CAD. EF 20-25%. 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 sHF. S/p sternum closure 10/3. BP 100/53, tachy. MAP 60s.  Meds: Milrinone 0.25, lasix IV bid, [id number], Levophed (weaning to off), lopressor   Hepatobil: New onset transaminitis, continues to fluctuate. TG 125 AST 39 >170>100 > 64 > 80 ALT 69 >183>167>134 > 126 Tbili 2.2 > 3.3   Neuro: Still having significant agitated delirium . Fent gtt, seroquel, Klonopin, versed prn, Haldol prn. GCS 13, RASS -2  ID: Abx for possible aspiration pneumonia, wbc 11.4, Afeb Vanc 9/26 > 9/30, 10/3 >> 10/16 Ceftaz 10/6 > 10/12 Zosyn 10/12 >>   Best Practices: Lovenox 30 mg  TPN Access: PICC  TPN start date: 10/1 > 10/6, 10/13  >   *IVFA were held 10/1 > 10/6, ok to resume now  Plan:  -E 5/15 at 75 ml/hr and IVFA 20% at 10 ml/hr on MWF will provide 90 g protein daily and an average of 1484 kcal per day -Add Multivitamin to TPN -Add trace elements MWF -Add insulin regular 10 units to TPN -Monitor renal fx and lytes -Magnesium 2 g IV x1 -- due to ileus + on milrinone    Agapito Games, PharmD, BCPS Clinical Pharmacist Pager: [phone] 02/21/2015 7:20 AM

## 2015-02-22 ENCOUNTER — Inpatient Hospital Stay (HOSPITAL_COMMUNITY): Payer: Medicaid Other

## 2015-02-22 DIAGNOSIS — J690 Pneumonitis due to inhalation of food and vomit: Secondary | ICD-10-CM

## 2015-02-22 DIAGNOSIS — Z930 Tracheostomy status: Secondary | ICD-10-CM

## 2015-02-22 LAB — URINE MICROSCOPIC-ADD ON

## 2015-02-22 LAB — GLUCOSE, CAPILLARY
GLUCOSE-CAPILLARY: 133 mg/dL — AB (ref 65–99)
GLUCOSE-CAPILLARY: 148 mg/dL — AB (ref 65–99)
Glucose-Capillary: 114 mg/dL — ABNORMAL HIGH (ref 65–99)
Glucose-Capillary: 119 mg/dL — ABNORMAL HIGH (ref 65–99)
Glucose-Capillary: 152 mg/dL — ABNORMAL HIGH (ref 65–99)
Glucose-Capillary: 156 mg/dL — ABNORMAL HIGH (ref 65–99)

## 2015-02-22 LAB — URINALYSIS, ROUTINE W REFLEX MICROSCOPIC
Glucose, UA: NEGATIVE mg/dL
Ketones, ur: NEGATIVE mg/dL
Nitrite: NEGATIVE
Protein, ur: 100 mg/dL — AB
Specific Gravity, Urine: 1.018 (ref 1.005–1.030)
Urobilinogen, UA: 0.2 mg/dL (ref 0.0–1.0)
pH: 7 (ref 5.0–8.0)

## 2015-02-22 LAB — BLOOD GAS, ARTERIAL
Acid-base deficit: 0.4 mmol/L (ref 0.0–2.0)
Bicarbonate: 24.3 mEq/L — ABNORMAL HIGH (ref 20.0–24.0)
FIO2: 0.4
MECHVT: 370 mL
O2 Saturation: 98.6 %
PEEP: 5 cmH2O
Patient temperature: 98.6
RATE: 14 resp/min
TCO2: 25.6 mmol/L (ref 0–100)
pCO2 arterial: 43.6 mmHg (ref 35.0–45.0)
pH, Arterial: 7.365 (ref 7.350–7.450)
pO2, Arterial: 116 mmHg — ABNORMAL HIGH (ref 80.0–100.0)

## 2015-02-22 LAB — CBC
HCT: 24.8 % — ABNORMAL LOW (ref 36.0–46.0)
Hemoglobin: 7.9 g/dL — ABNORMAL LOW (ref 12.0–15.0)
MCH: 28.1 pg (ref 26.0–34.0)
MCHC: 31.9 g/dL (ref 30.0–36.0)
MCV: 88.3 fL (ref 78.0–100.0)
Platelets: 377 10*3/uL (ref 150–400)
RBC: 2.81 MIL/uL — ABNORMAL LOW (ref 3.87–5.11)
RDW: 17.7 % — ABNORMAL HIGH (ref 11.5–15.5)
WBC: 10.2 10*3/uL (ref 4.0–10.5)

## 2015-02-22 LAB — COMPREHENSIVE METABOLIC PANEL
ALT: 129 U/L — ABNORMAL HIGH (ref 14–54)
AST: 67 U/L — ABNORMAL HIGH (ref 15–41)
Albumin: 2 g/dL — ABNORMAL LOW (ref 3.5–5.0)
Alkaline Phosphatase: 132 U/L — ABNORMAL HIGH (ref 38–126)
Anion gap: 10 (ref 5–15)
BUN: 77 mg/dL — ABNORMAL HIGH (ref 6–20)
CO2: 26 mmol/L (ref 22–32)
Calcium: 8.5 mg/dL — ABNORMAL LOW (ref 8.9–10.3)
Chloride: 106 mmol/L (ref 101–111)
Creatinine, Ser: 1.64 mg/dL — ABNORMAL HIGH (ref 0.44–1.00)
GFR calc Af Amer: 38 mL/min — ABNORMAL LOW (ref >60)
GFR calc non Af Amer: 33 mL/min — ABNORMAL LOW (ref >60)
Glucose, Bld: 133 mg/dL — ABNORMAL HIGH (ref 65–99)
Potassium: 4.1 mmol/L (ref 3.5–5.1)
Sodium: 142 mmol/L (ref 135–145)
Total Bilirubin: 3.9 mg/dL — ABNORMAL HIGH (ref 0.3–1.2)
Total Protein: 6.6 g/dL (ref 6.5–8.1)

## 2015-02-22 LAB — DIFFERENTIAL
Basophils Absolute: 0.1 10*3/uL (ref 0.0–0.1)
Basophils Relative: 1 %
Eosinophils Absolute: 0.5 10*3/uL (ref 0.0–0.7)
Eosinophils Relative: 5 %
Lymphocytes Relative: 12 %
Lymphs Abs: 1.2 10*3/uL (ref 0.7–4.0)
Monocytes Absolute: 0.9 10*3/uL (ref 0.1–1.0)
Monocytes Relative: 9 %
Neutro Abs: 7.6 10*3/uL (ref 1.7–7.7)
Neutrophils Relative %: 75 %

## 2015-02-22 LAB — CARBOXYHEMOGLOBIN
Carboxyhemoglobin: 1.9 % — ABNORMAL HIGH (ref 0.5–1.5)
Methemoglobin: 0.9 % (ref 0.0–1.5)
O2 Saturation: 78.2 %
Total hemoglobin: 7.8 g/dL — ABNORMAL LOW (ref 12.0–16.0)

## 2015-02-22 LAB — FERRITIN: Ferritin: 1025 ng/mL — ABNORMAL HIGH (ref 11–307)

## 2015-02-22 LAB — IRON AND TIBC
IRON: 19 ug/dL — AB (ref 28–170)
Saturation Ratios: 13 % (ref 10.4–31.8)
TIBC: 143 ug/dL — ABNORMAL LOW (ref 250–450)
UIBC: 124 ug/dL

## 2015-02-22 LAB — PREALBUMIN: Prealbumin: 13 mg/dL — ABNORMAL LOW (ref 18–38)

## 2015-02-22 LAB — MAGNESIUM: Magnesium: 2.3 mg/dL (ref 1.7–2.4)

## 2015-02-22 LAB — PROCALCITONIN: Procalcitonin: 0.98 ng/mL

## 2015-02-22 LAB — TRIGLYCERIDES: Triglycerides: 171 mg/dL — ABNORMAL HIGH (ref <150)

## 2015-02-22 LAB — PHOSPHORUS: Phosphorus: 3.7 mg/dL (ref 2.5–4.6)

## 2015-02-22 MED ORDER — TRACE MINERALS CR-CU-MN-SE-ZN 10-1000-500-60 MCG/ML IV SOLN
INTRAVENOUS | Status: DC
  Filled 2015-02-22: qty 960

## 2015-02-22 MED ORDER — FUROSEMIDE 10 MG/ML IJ SOLN: 40.0000 mg | Freq: Once | INTRAMUSCULAR | Status: DC

## 2015-02-22 MED ORDER — VITAL 1.5 CAL PO LIQD
1000.0000 mL | ORAL | Status: DC
  Administered 2015-02-22: 1000 mL
  Filled 2015-02-22: qty 1000

## 2015-02-22 MED ORDER — VITAL 1.5 CAL PO LIQD
1000.0000 mL | ORAL | Status: DC
  Filled 2015-02-22 (×2): qty 1000

## 2015-02-22 MED ORDER — SODIUM CHLORIDE 0.9 % IV SOLN
INTRAVENOUS | Status: DC
  Administered 2015-02-22 – 2015-02-27 (×2): via INTRAVENOUS
  Administered 2015-02-27: 500 mL via INTRAVENOUS
  Administered 2015-02-28 – 2015-03-09 (×2): via INTRAVENOUS

## 2015-02-22 MED ORDER — NITROGLYCERIN 0.4 MG/HR TD PT24
0.4000 mg | MEDICATED_PATCH | Freq: Every day | TRANSDERMAL | Status: DC
  Administered 2015-02-22 – 2015-03-13 (×20): 0.4 mg via TRANSDERMAL
  Filled 2015-02-22 (×20): qty 1

## 2015-02-22 MED ORDER — ISOSORBIDE MONONITRATE 15 MG HALF TABLET
15.0000 mg | ORAL_TABLET | Freq: Every day | ORAL | Status: DC
  Filled 2015-02-22: qty 1

## 2015-02-22 MED ORDER — HYDRALAZINE HCL 10 MG PO TABS
10.0000 mg | ORAL_TABLET | Freq: Three times a day (TID) | ORAL | Status: DC
  Administered 2015-02-22 – 2015-03-15 (×61): 10 mg via ORAL
  Filled 2015-02-22 (×72): qty 1

## 2015-02-22 MED ORDER — POTASSIUM CHLORIDE 10 MEQ/50ML IV SOLN: 10.0000 meq | INTRAVENOUS | Status: DC

## 2015-02-22 MED ORDER — BACID PO TABS
2.0000 | ORAL_TABLET | Freq: Two times a day (BID) | ORAL | Status: DC
  Administered 2015-02-22 – 2015-03-15 (×42): 2 via ORAL
  Filled 2015-02-22 (×45): qty 2

## 2015-02-22 NOTE — Progress Notes (Signed)
Brett CanalesSteve from respiratory called regarding pts respiratory status

## 2015-02-22 NOTE — Progress Notes (Signed)
PULMONARY / CRITICAL CARE MEDICINE   Name: FUSAKO TANABE MRN: [id number] DOB: [birth year]    ADMISSION DATE:  01/26/2015 CONSULTATION DATE:  02/09/2105  REFERRING MD :  Donata Clay   CHIEF COMPLAINT:  Vent management   INITIAL PRESENTATION:  64 yo female admitted with chest pain from CAD.  She had CABG 02/01/15 >> post-op complicated by cardiogenic shock s/p centrimag placement and removal, ischemic cardiomyopathy with EF 20%, open sternum.  She remained on vent post-op and PCCM consulted to assist with vent weaning.  STUDIES:  9/21 Echo >> EF 20-25%, mod MR, PA press 51 mmHg 10/15 Echo >> EF 25 to 30%, PAS 36 mmHg  SIGNIFICANT EVENTS: 09/26 CABG x 4, placement of centrimag L ventricular assistive device  09/30 removal centrimag  10/03 sternal closure 10/09 Hg drop overnight by 1 gm and transfusion ordered 10/10 Trach by DF 10/12 Off precedex 10/13 possible aspiration event; TNA started 10/14 difficulty with agitation 10/16 off pressors  SUBJECTIVE: Tolerating pressure support 20/5.  Off pressors.  VITAL SIGNS: Temp:  [97.9 F (36.6 C)-99.8 F (37.7 C)] 99.7 F (37.6 C) (10/17 0800) Pulse Rate:  [90-126] 124 (10/17 0800) Resp:  [13-31] 14 (10/17 0800) BP: (80-160)/(37-76) 130/62 mmHg (10/17 0800) SpO2:  [94 %-100 %] 99 % (10/17 0800) Arterial Line BP: (75-181)/(38-73) 148/62 mmHg (10/17 0800) FiO2 (%):  [40 %] 40 % (10/17 0736) Weight:  [106 lb 4.2 oz (48.2 kg)] 106 lb 4.2 oz (48.2 kg) (10/17 0400)   HEMODYNAMICS: CVP:  [6 mmHg-22 mmHg] 12 mmHg   VENTILATOR SETTINGS: Vent Mode:  [-] CPAP;PSV FiO2 (%):  [40 %] 40 % Set Rate:  [14 bmp] 14 bmp Vt Set:  [370 mL] 370 mL PEEP:  [5 cmH20] 5 cmH20 Pressure Support:  [20 cmH20] 20 cmH20 Plateau Pressure:  [18 cmH20-26 cmH20] 18 cmH20  INTAKE / OUTPUT:  Intake/Output Summary (Last 24 hours) at 02/22/15 0833 Last data filed at 02/22/15 0823  Gross per 24 hour  Intake   3645 ml  Output   4090 ml  Net   -445 ml    PHYSICAL EXAMINATION: General: pleasant Neuro: RASS 0, moves all extremities HEENT: trach site clean Cardiovascular: regular, tachycardic Lungs: b/l crackles, no wheeze Abdomen: soft, non tender Musculoskeletal: 1+ edema  LABS: PULMONARY  Recent Labs Lab 02/19/15 0527  02/19/15 0750 02/19/15 1635  02/20/15 0550 02/21/15 0430 02/21/15 1225 02/22/15 0342 02/22/15 0343  PHART 7.402  --  7.381  --   --  7.275* 7.368  --   --  7.365  PCO2ART 36.9  --  38.1  --   --  50.2* 41.4  --   --  43.6  PO2ART 79.0*  --  91.1  --   --  110* 131*  --   --  116*  HCO3 22.7  --  21.9  --   --  22.3 23.2  --   --  24.3*  TCO2 24  --  23.0 21  --  23.8 24.5  --   --  25.6  O2SAT 95.0  < > 96.5  --   < > 97.8 71.5  98.7 78.2 78.2 98.6  < > = values in this interval not displayed.  CBC  Recent Labs Lab 02/20/15 0600 02/21/15 0426 02/22/15 0340  HGB 8.6* 8.1* 7.9*  HCT 28.1* 26.3* 24.8*  WBC 15.3* 11.4* 10.2  PLT 419* 357 377   CHEMISTRY  Recent Labs Lab 02/18/15 0450  02/19/15 0525  02/20/15 0600  02/21/15 0426 02/21/15 1230 02/21/15 1540 02/21/15 2145 02/22/15 0340  NA 147*  < > 144  < > 142 140 139 137 138 142  K 3.3*  < > 3.9  < > 3.5 2.4* 3.4* 2.7* 3.2* 4.1  CL 111  < > 111  < > 104 101 101 101 102 106  CO2 20*  --  22  --  25 25 26 24 25 26   GLUCOSE 129*  < > 122*  < > 166* 153* 198* 121* 165* 133*  BUN 56*  < > 67*  < > 74* 81* 82* 81* 78* 77*  CREATININE 1.28*  < > 1.54*  < > 1.70* 1.72* 1.78* 1.75* 1.68* 1.64*  CALCIUM 8.6*  --  8.7*  --  8.4* 8.0* 8.0* 8.2* 8.3* 8.5*  MG 1.9  --  2.4  --  2.2 1.7  --   --   --  2.3  PHOS 3.9  --  4.5  --  6.6* 4.6  --   --   --  3.7  < > = values in this interval not displayed. Estimated Creatinine Clearance: 25.5 mL/min (by C-G formula based on Cr of 1.64).  LIVER  Recent Labs Lab 02/18/15 0450 02/19/15 0525 02/20/15 0600 02/21/15 0426 02/22/15 0340  AST 170* 100* 64* 80* 67*  ALT 183* 167* 134* 126* 129*  ALKPHOS  184* 155* 154* 151* 132*  BILITOT 2.5* 2.2* 2.2* 3.3* 3.9*  PROT 7.2 7.0 6.9 6.4* 6.6  ALBUMIN 2.4* 2.4* 2.3* 2.0* 2.0*   ENDOCRINE CBG (last 3)   Recent Labs  02/21/15 2346 02/22/15 0358 02/22/15 0733  GLUCAP 156* 119* 152*    IMAGING x48h Dg Chest Port 1 View  02/22/2015  CLINICAL DATA:  Acute respiratory failure. On ventilator coronary artery disease and ischemic cardiomyopathy. Mitral regurgitation. EXAM: PORTABLE CHEST 1 VIEW COMPARISON:  02/21/2015 FINDINGS: Support apparatus remains in stable position. No pneumothorax visualized. Bilateral heterogeneous pulmonary airspace disease is again seen, worse in the right upper lobe. This shows no significant change. Mild cardiomegaly stable. IMPRESSION: Bilateral pulmonary airspace disease, without significant change. Electronically Signed   By: Myles RosenthalJohn  Stahl M.D.   On: 02/22/2015 07:23   Dg Chest Port 1 View  02/21/2015  CLINICAL DATA:  Respiratory distress EXAM: PORTABLE CHEST 1 VIEW COMPARISON:  Yesterday FINDINGS: Tubular device is stable. Extensive airspace disease throughout the left lung has improved. Airspace disease throughout the right lung with an upper lobe distribution is stable. No pneumothorax. Mild cardiomegaly. IMPRESSION: Bilateral airspace disease is improved on the left but stable on the right. Electronically Signed   By: Jolaine ClickArthur  Hoss M.D.   On: 02/21/2015 08:46   Dg Abd Portable 1v  02/22/2015  CLINICAL DATA:  Feeding tube placement. EXAM: PORTABLE ABDOMEN - 1 VIEW COMPARISON:  02/18/2015 FINDINGS: No evidence of dilated bowel loops.  Bowel gas pattern is normal. Feeding tube is seen with tip in the descending portion the duodenum. Nasogastric tube is seen with tip in the gastric antrum. Temperature probe noted in the urinary bladder. IMPRESSION: Normal bowel gas pattern. Feeding tube tip in descending duodenum and nasogastric tube tip in gastric antrum. Electronically Signed   By: Myles RosenthalJohn  Stahl M.D.   On: 02/22/2015 07:30     ASSESSMENT / PLAN:  PULMONARY ETT 9/26 >> 10/07 Trach (DF) 10/10 >> A: Acute on chronic respiratory failure 2nd to acute pulmonary edema/cardiogenic shock after CABG, deconditioning, and aspiration pneumonia. Failure to wean from vent s/p tracheostomy. P:  Pressure support wean to trach collar as tolerated F/u CXR Trach care, bronchial hygiene Change to prn albuterol  CARDIOVASCULAR Lt PICC 10/03 >>  Lt brachial aline 10/13 >> A: CAD s/p CABG.  Acute on chronic systolic heart failure with ischemic CM. Cardiogenic shock. HLD >> reported muscle aches with Crestor. P:  Milrinone per TCTS and cardiology Continue ASA, lopressor Defer to TCTS and cardiology about adding lipid lowering agent, ACE inhibitor  RENAL A: AKI in setting of cardiogenic shock >> baseline creatinine 0.93 from 10/01/14 Hypokalemia. P:   Monitor renal fx, urine outpt Replace electrolytes as needed  GASTROINTESTINAL A: Protein calorie malnutrition. Elevated LFT's. P:   TNA per primary team >> plan to start tube feeds 10/17 F/u LFTs Protonix for SUP  HEMATOLOGIC A: Anemia of critical illness. P:  F/u CBC Check iron levels Lovenox for DVT prevention  INFECTIOUS A: Aspiration pneumonia 10/13. P:   Day 6 of zosyn F/u procalcitonin to assist with Abx duration  Blood 10/13 >>  ENDOCRINE A: Hyperglycemia  P:   SSI  NEUROLOGIC A: Agitated delirium. Deconditioning. P:   Wean off klonopin, seroquel as tolerated Fentanyl gtt PRN versed RASS goal 0 Defer to TCTS about timing of mobilizing pt and working with physical therapy  DISPOSITION: Likely will eventually need LTAC to assist with rehab and vent weaning.  CC time 36 minutes  Coralyn Helling, MD [facility] Pulmonary/Critical Care 02/22/2015, 8:54 AM Pager:  [phone] After 3pm call: [phone]

## 2015-02-22 NOTE — Progress Notes (Signed)
Advanced Heart Failure Rounding Note   Subjective:    S/P CABG with Centrimag placement 02/01/15  Centrimag explanted 9/30.   Sternal Closure 10/3.    10/7 Extubated, reintubated 2 hours later.   Awake on vent (trach). Yesterday lasix held and continued on milrinone 0.25 mcg. Todays CO-OX is 78%. CVP 15. Following commands.    ECHO 10/15 EF 25-30%  RV not visualized. Peak PA pressure 36 mm hg.     Creatinine 1.7>1.6   Objective:   Weight Range:  Vital Signs:   Temp:  [97.9 F (36.6 C)-99.8 F (37.7 C)] 99.7 F (37.6 C) (10/17 0900) Pulse Rate:  [90-126] 122 (10/17 0900) Resp:  [13-31] 17 (10/17 0900) BP: (80-160)/(37-76) 139/64 mmHg (10/17 0900) SpO2:  [94 %-100 %] 100 % (10/17 0900) Arterial Line BP: (75-181)/(38-73) 143/62 mmHg (10/17 0900) FiO2 (%):  [40 %] 40 % (10/17 0736) Weight:  [106 lb 4.2 oz (48.2 kg)] 106 lb 4.2 oz (48.2 kg) (10/17 0400) Last BM Date: 02/22/15 (flexiseal)  Weight change: Filed Weights   02/19/15 0500 02/21/15 0600 02/22/15 0400  Weight: 106 lb 7.7 oz (48.3 kg) 110 lb 3.7 oz (50 kg) 106 lb 4.2 oz (48.2 kg)    Intake/Output:   Intake/Output Summary (Last 24 hours) at 02/22/15 0920 Last data filed at 02/22/15 0823  Gross per 24 hour  Intake 3470.3 ml  Output   3740 ml  Net -269.7 ml     Physical Exam:  CVP 15 General:  On trach. Awake. HEENT: normal x ETT. Neck: supple. + trach Cor: Chest with dressing. Tachy regular Lungs: CTA Abdomen: soft, nontender, nondistended. Hypoactive bowel sounds. Extremities: no cyanosis, clubbing, rash,  1-2+ LE edema in thighs. SCDs in place.R and LLE heel boots on.  Neuro: awake on vent    Telemetry: Sinus tach 120s  Labs: Basic Metabolic Panel:  Recent Labs Lab 02/18/15 0450  02/19/15 0525  02/20/15 0600 02/21/15 0426 02/21/15 1230 02/21/15 1540 02/21/15 2145 02/22/15 0340  NA 147*  < > 144  < > 142 140 139 137 138 142  K 3.3*  < > 3.9  < > 3.5 2.4* 3.4* 2.7* 3.2* 4.1  CL  111  < > 111  < > 104 101 101 101 102 106  CO2 20*  --  22  --  GLUCOSE 129*  < > 122*  < > 166* 153* 198* 121* 165* 133*  BUN 56*  < > 67*  < > 74* 81* 82* 81* 78* 77*  CREATININE 1.28*  < > 1.54*  < > 1.70* 1.72* 1.78* 1.75* 1.68* 1.64*  CALCIUM 8.6*  --  8.7*  --  8.4* 8.0* 8.0* 8.2* 8.3* 8.5*  MG 1.9  --  2.4  --  2.2 1.7  --   --   --  2.3  PHOS 3.9  --  4.5  --  6.6* 4.6  --   --   --  3.7  < > = values in this interval not displayed.  Liver Function Tests:  Recent Labs Lab 02/18/15 0450 02/19/15 0525 02/20/15 0600 02/21/15 0426 02/22/15 0340  AST 170* 100* 64* 80* 67*  ALT 183* 167* 134* 126* 129*  ALKPHOS 184* 155* 154* 151* 132*  BILITOT 2.5* 2.2* 2.2* 3.3* 3.9*  PROT 7.2 7.0 6.9 6.4* 6.6  ALBUMIN 2.4* 2.4* 2.3* 2.0* 2.0*   No results for input(s): LIPASE, AMYLASE in the last 168 hours. No  results for input(s): AMMONIA in the last 168 hours.  CBC:  Recent Labs Lab 02/18/15 0450  02/19/15 0525 02/19/15 1635 02/20/15 0600 02/21/15 0426 02/22/15 0340  WBC 14.4*  --  15.9*  --  15.3* 11.4* 10.2  NEUTROABS  --   --   --   --   --   --  7.6  HGB 9.7*  < > 9.0* 10.2* 8.6* 8.1* 7.9*  HCT 29.9*  < > 27.9* 30.0* 28.1* 26.3* 24.8*  MCV 87.7  --  88.0  --  91.2 88.9 88.3  PLT 498*  --  459*  --  419* 357 377  < > = values in this interval not displayed.  Cardiac Enzymes: No results for input(s): CKTOTAL, CKMB, CKMBINDEX, TROPONINI in the last 168 hours.  BNP: BNP (last 3 results)  Recent Labs  07/10/14 1315  BNP 1059.1*    ProBNP (last 3 results) No results for input(s): PROBNP in the last 8760 hours.    Other results:  Imaging: Dg Chest Port 1 View  02/22/2015  CLINICAL DATA:  Acute respiratory failure. On ventilator coronary artery disease and ischemic cardiomyopathy. Mitral regurgitation. EXAM: PORTABLE CHEST 1 VIEW COMPARISON:  02/21/2015 FINDINGS: Support apparatus remains in stable position. No pneumothorax visualized.  Bilateral heterogeneous pulmonary airspace disease is again seen, worse in the right upper lobe. This shows no significant change. Mild cardiomegaly stable. IMPRESSION: Bilateral pulmonary airspace disease, without significant change. Electronically Signed   By: Myles RosenthalJohn  Stahl M.D.   On: 02/22/2015 07:23   Dg Chest Port 1 View  02/21/2015  CLINICAL DATA:  Respiratory distress EXAM: PORTABLE CHEST 1 VIEW COMPARISON:  Yesterday FINDINGS: Tubular device is stable. Extensive airspace disease throughout the left lung has improved. Airspace disease throughout the right lung with an upper lobe distribution is stable. No pneumothorax. Mild cardiomegaly. IMPRESSION: Bilateral airspace disease is improved on the left but stable on the right. Electronically Signed   By: Jolaine ClickArthur  Hoss M.D.   On: 02/21/2015 08:46   Dg Abd Portable 1v  02/22/2015  CLINICAL DATA:  Feeding tube placement. EXAM: PORTABLE ABDOMEN - 1 VIEW COMPARISON:  02/18/2015 FINDINGS: No evidence of dilated bowel loops.  Bowel gas pattern is normal. Feeding tube is seen with tip in the descending portion the duodenum. Nasogastric tube is seen with tip in the gastric antrum. Temperature probe noted in the urinary bladder. IMPRESSION: Normal bowel gas pattern. Feeding tube tip in descending duodenum and nasogastric tube tip in gastric antrum. Electronically Signed   By: Myles RosenthalJohn  Stahl M.D.   On: 02/22/2015 07:30     Medications:     Scheduled Medications: . antiseptic oral rinse  7 mL Mouth Rinse QID  . aspirin EC  325 mg Oral Daily   Or  . aspirin  324 mg Per Tube Daily  . chlorhexidine gluconate  15 mL Mouth Rinse BID  . clonazePAM  0.5 mg Oral BID  . enoxaparin (LOVENOX) injection  30 mg Subcutaneous Q24H  . insulin aspart  0-24 Units Subcutaneous 6 times per day  . metoprolol tartrate  25 mg Oral BID  . mupirocin ointment  1 application Topical BID  . pantoprazole sodium  40 mg Per Tube Q1200  . piperacillin-tazobactam (ZOSYN)  IV  3.375 g  Intravenous Q8H  . QUEtiapine  100 mg Oral QHS  . sodium chloride  10-40 mL Intracatheter Q12H  . sodium chloride  3 mL Intravenous Q12H    Infusions: . Marland Kitchen.TPN (CLINIMIX-E) Adult 75 mL/hr  at 02/22/15 0800  . sodium chloride 10 mL/hr at 02/22/15 0824  . dextrose 10 mL/hr at 02/22/15 0800  . feeding supplement (VITAL 1.5 CAL)    . fentaNYL infusion INTRAVENOUS 300 mcg/hr (02/22/15 0800)  . milrinone 0.25 mcg/kg/min (02/22/15 0800)    PRN Medications: Place/Maintain arterial line **AND** sodium chloride, albuterol, fentaNYL, magic mouthwash, midazolam, ondansetron (ZOFRAN) IV, potassium chloride, sodium chloride, sodium chloride   Assessment:   1. Cardiogenic shock   --s/p CABG with centrimag placement 02/01/15. Centrimag removed 9/30 2. CAD s/p CABG 3. 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 Acute respiratory failure. S/P Trach  5. Acute blood loss anemia 6. Thrombocytopenia: HIT negative by SRA 7. Hyponatremia 8. Hypokalemia  Plan/Discussion:    CO-OX 78%. Continue milrinone 0.25 mcg. Wean once volume status improved. Renal function ok. Volume status remains elevated. CVP 15. Give 40 mg IV lasix now. Start hydralazine 10 three times and 15 mg imdur. No ace or spiro for now.   Remains in Sinus Tach. Currently on metoprolol tartrate but need to switch to Toprol XL when discharged.    Amy Clegg NP-C  9:20 AM  Patient seen and examined with Tonye Becket, NP. We discussed all aspects of the encounter. I agree with the assessment and plan as stated above.   Improving slowly. Volume status getting better but still overloaded. Agree with lasix. Start afterload reduction. Co-ox ok. Go slow with b-blocker.   Bensimhon, Daniel,MD 7:11 PM

## 2015-02-22 NOTE — Progress Notes (Addendum)
EVENING ROUNDS NOTE :     [address]       [place] [id number]             [phone]                 14 Days Post-Op Procedure(s) (LRB): STERNAL CLOSURE (N/A) TRANSESOPHAGEAL ECHOCARDIOGRAM (TEE) (N/A)  Total Length of Stay:  LOS: 27 days  BP 133/82 mmHg  Pulse 127  Temp(Src) 100.1 F (37.8 C) (Core (Comment))  Resp 22  Ht 5' (1.524 m)  Wt 106 lb 4.2 oz (48.2 kg)  BMI 20.75 kg/m2  SpO2 100%  .Intake/Output      10/17 0701 - 10/18 0700   I.V. (mL/kg) 747.6 (15.5)   NG/GT 450   IV Piggyback 100   TPN 550   Total Intake(mL/kg) 1847.6 (38.3)   Urine (mL/kg/hr) 1585 (2.3)   Emesis/NG output 150 (0.2)   Stool 0 (0)   Total Output 1735   Net +112.6         . sodium chloride 10 mL/hr at 02/22/15 1200  . dextrose 10 mL/hr at 02/22/15 1200  . feeding supplement (VITAL 1.5 CAL)    . fentaNYL infusion INTRAVENOUS 300 mcg/hr (02/22/15 2000)  . milrinone 0.25 mcg/kg/min (02/22/15 2000)     Lab Results  Component Value Date   WBC 10.2 02/22/2015   HGB 7.9* 02/22/2015   HCT 24.8* 02/22/2015   PLT 377 02/22/2015   GLUCOSE 133* 02/22/2015   CHOL 223* 01/27/2015   TRIG 171* 02/22/2015   HDL 25* 01/27/2015   LDLCALC 179* 01/27/2015   ALT 129* 02/22/2015   AST 67* 02/22/2015   NA 142 02/22/2015   K 4.1 02/22/2015   CL 106 02/22/2015   CREATININE 1.64* 02/22/2015   BUN 77* 02/22/2015   CO2 26 02/22/2015   TSH 3.572 07/14/2014   INR 1.44 02/13/2015   HGBA1C 6.3* 01/30/2015      Tachycardic, otherwise remains status quo   02/22/2015 9:27 PM    No changes tonight I have seen and examined Breanna Alvarez and agree with the above assessment  and plan.  Delight OvensEdward B Zanna Hawn MD [facility] [phone] Office [phone] 02/23/2015 12:03 AM

## 2015-02-22 NOTE — Progress Notes (Signed)
PARENTERAL NUTRITION CONSULT NOTE  Pharmacy Consult for TPN Indication: Prolonged ileus  Allergies  Allergen Reactions  . Atorvastatin Other (See Comments)    Gas, chest tightness  . Crestor [Rosuvastatin Calcium] Other (See Comments)    Muscle aches   Patient Measurements: Height: 5' (152.4 cm) Weight: 106 lb 4.2 oz (48.2 kg) IBW/kg (Calculated) : 45.5  Usual Weight: ~100-106 pounds (46-48 kg)  Vital Signs: Temp: 99.8 F (37.7 C) (10/17 0700) BP: 110/52 mmHg (10/17 0736) Pulse Rate: 126 (10/17 0736) Intake/Output from previous day: 10/16 0701 - 10/17 0700 In: 3680.7 [I.V.:900.7; NG/GT:180; IV Piggyback:800; TPN:1800] Out: 4190 [Urine:3915; Emesis/NG output:150; Stool:125] Intake/Output from this shift:   Labs:  Recent Labs  02/20/15 0600 02/21/15 0426 02/22/15 0340  WBC 15.3* 11.4* 10.2  HGB 8.6* 8.1* 7.9*  HCT 28.1* 26.3* 24.8*  PLT 419* 357 377    Recent Labs  02/20/15 0600 02/21/15 0426  02/21/15 1540 02/21/15 2145 02/22/15 0340  NA 142 140  < > 137 138 142  K 3.5 2.4*  < > 2.7* 3.2* 4.1  CL 104 101  < > 101 102 106  CO2 25 25  < > GLUCOSE 166* 153*  < > 121* 165* 133*  BUN 74* 81*  < > 81* 78* 77*  CREATININE 1.70* 1.72*  < > 1.75* 1.68* 1.64*  CALCIUM 8.4* 8.0*  < > 8.2* 8.3* 8.5*  MG 2.2 1.7  --   --   --  2.3  PHOS 6.6* 4.6  --   --   --  3.7  PROT 6.9 6.4*  --   --   --  6.6  ALBUMIN 2.3* 2.0*  --   --   --  2.0*  AST 64* 80*  --   --   --  67*  ALT 134* 126*  --   --   --  129*  ALKPHOS 154* 151*  --   --   --  132*  BILITOT 2.2* 3.3*  --   --   --  3.9*  PREALBUMIN  --   --   --   --   --  13.0*  TRIG  --   --   --   --   --  171*  < > = values in this interval not displayed. Estimated Creatinine Clearance: 25.5 mL/min (by C-G formula based on Cr of 1.64).   Recent Labs  02/21/15 1929 02/21/15 2346 02/22/15 0358  GLUCAP 183* 156* 119*   Medical History: Past Medical History  Diagnosis Date  . Hyperlipidemia   .  Acute systolic CHF (congestive heart failure), NYHA class 3 (HCC) 07/10/2014  . Hypertension    Medications:  Infusions:  . Marland KitchenTPN (CLINIMIX-E) Adult 75 mL/hr at 02/22/15 0700  . dextrose 10 mL/hr at 02/22/15 0700  . feeding supplement (VITAL 1.5 CAL)    . fentaNYL infusion INTRAVENOUS 300 mcg/hr (02/22/15 0700)  . milrinone 0.25 mcg/kg/min (02/22/15 0700)  . norepinephrine (LEVOPHED) Adult infusion Stopped (02/21/15 1600)   Admit:  64 yo female with cardiac problems and recent CABG on 9/26 and Centrimag removal on 9/30 with an open sternum and plans for return to OR 10/3. Complicated post-op course.  Surgeries/Procedures: S/p CABG x4, TEE, Removal of CVAD, Sternal closure  Insulin Requirements in the past 24 hours:  20 units/24h on SSI q4h  GI: Post-op ileus, Prealbumin low, trending up 6.6>>13. LBM 10/13. Previously on TPN 10/1 > 10/6, was transitioned to TF,  these were turned off 10/13 due to possible aspiration. TPN to dc today per CTS note - PANDA in place. TF starting this AM >> MD wants to wean TPN to off - adjusted TPN rate from 5975ml/h to 8250ml/h this AM. On ppi   Endo: cbgs 111-183 on ssi+10u ins in TPN bag, pt previously required 20-40 units of insulin in TPN. Monitor glucose  Lytes: K 3.2>>4.1 s/p 14 k runs + [id number] x1 (goal>/=4 with ileus), Mg1.7>> 2.3 s/p Mag sulfate 2g (goal>/=2 with ileus), Phos 3.7, CoCa 10, Ca x Phos: 37  Renal: SCr 1 on 10/12, now stable 1.64, eCrCl 25-30 ml/min, Good UOP 3.614ml/kg/h. D5W @ 10 ml/hr  Pulm: s/p trach 10/10, difficult time weaning from vent, MD notes worsening cxr - now improved. xopenex q6h  Cards: HTN, CAD. EF 20-25%. 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 sHF. S/p sternum closure 10/3. BP 100/53, tachy. MAP 60s. Meds: Milrinone 0.25, holding lasix IV bid, [id number], Levophed off, lopressor   Hepatobil: New onset transaminitis, continues to fluctuate. TG 125 > 171 AST 39 >170>100 > 64 > 80 > 67 ALT 69 >183>167>134 > 126 >  129 Tbili 2.2 > 3.3 > 3.9  Neuro: Still having significant agitated delirium . Fent gtt, seroquel, Klonopin, versed prn, Haldol prn. GCS 11, RASS at goal -2, CPOT 0  ID: Abx for possible aspiration pneumonia, wbc to wnl, Afeb Vanc 9/26 > 9/30, 10/3 >> 10/16 Ceftaz 10/6 > 10/12 Zosyn 10/12 >>   BCx2 10/12 >> ngtd Trach 10/12 >> ngf  Best Practices: Lovenox 30 mg  TPN Access: PICC  TPN start date: 10/1 > 10/6, 10/13 >   *IVFA were held 10/1 > 10/6, ok to resume now  Current Nutrition:  Clinimix E 5/15 at 75 ml/hr and IVFA 20% at 10 ml/hr on MWF (MD decreased rate to 6750ml/h this AM) Vital 1.5 CAL @ 20 ml/h (started 10/17) - provides 720kcal + 32.4g protein   Nutritional Goals: Per RD assessment 10/14 1417 kcal  85-95 g protein  Plan:  -Decrease Clinimix E 5/15 to 50 ml/h per Dr. Zenaida NieceVan Tright this AM. To wean TPN to off today and with TF restarting -Pharmacy will d/c TPN consult and labs    Babs BertinHaley Ellanora Alvarez, PharmD Clinical Pharmacist Pager [phone] 02/22/2015 8:20 AM

## 2015-02-22 NOTE — Progress Notes (Signed)
14 Days Post-Op Procedure(s) (LRB): STERNAL CLOSURE (N/A) TRANSESOPHAGEAL ECHOCARDIOGRAM (TEE) (N/A) Subjective: Aspiration pneumonia of right lung following tracheostomy Pulmonary status now improved and patient tolerating some pressure support weaning We'll transition from T NA to enteric tube feeds and postpyloric Panda tube with attention to leave tracheal cuff inflated, leave drain in fundus of stomach, and to avoid Trendelenburg position in order to prevent recurrent aspiration.  Echocardiogram performed personally reviewed showing ejection fraction 35-40 percent without mitral regurgitation. Apical akinesia of LV.  Sternal incision healing. BUN greater than 75-will hold Lasix for another 24 hours  Objective: Vital signs in last 24 hours: Temp:  [97.9 F (36.6 C)-99.9 F (37.7 C)] 99.7 F (37.6 C) (10/17 1100) Pulse Rate:  [90-130] 96 (10/17 1100) Cardiac Rhythm:  [-] Sinus tachycardia (10/17 0800) Resp:  [13-31] 14 (10/17 1100) BP: (80-160)/(37-76) 87/38 mmHg (10/17 1100) SpO2:  [94 %-100 %] 100 % (10/17 1100) Arterial Line BP: (75-181)/(35-73) 87/35 mmHg (10/17 1100) FiO2 (%):  [40 %] 40 % (10/17 0736) Weight:  [106 lb 4.2 oz (48.2 kg)] 106 lb 4.2 oz (48.2 kg) (10/17 0400)  Hemodynamic parameters for last 24 hours: CVP:  [6 mmHg-22 mmHg] 10 mmHg  Intake/Output from previous day: 10/16 0701 - 10/17 0700 In: 3680.7 [I.V.:900.7; NG/GT:180; IV Piggyback:800; TPN:1800] Out: 4190 [Urine:3915; Emesis/NG output:150; Stool:125] Intake/Output this shift: Total I/O In: 603.6 [I.V.:213.6; NG/GT:90; IV Piggyback:50; TPN:250] Out: 450 [Urine:450]  Comfortable while sedated on ventilator No edema Coarse breath sounds Minimal tracheal secretions  Lab Results:  Recent Labs  02/21/15 0426 02/22/15 0340  WBC 11.4* 10.2  HGB 8.1* 7.9*  HCT 26.3* 24.8*  PLT 357 377   BMET:  Recent Labs  02/21/15 2145 02/22/15 0340  NA 138 142  K 3.2* 4.1  CL 102 106  CO2 25 26   GLUCOSE 165* 133*  BUN 78* 77*  CREATININE 1.68* 1.64*  CALCIUM 8.3* 8.5*    PT/INR: No results for input(s): LABPROT, INR in the last 72 hours. ABG    Component Value Date/Time   PHART 7.365 02/22/2015 0343   HCO3 24.3* 02/22/2015 0343   TCO2 25.6 02/22/2015 0343   ACIDBASEDEF 0.4 02/22/2015 0343   O2SAT 98.6 02/22/2015 0343   CBG (last 3)   Recent Labs  02/21/15 2346 02/22/15 0358 02/22/15 0733  GLUCAP 156* 119* 152*    Assessment/Plan: S/P Procedure(s) (LRB): STERNAL CLOSURE (N/A) TRANSESOPHAGEAL ECHOCARDIOGRAM (TEE) (N/A) Continue ABX therapy due to Post-op infection Aspiration pneumonia on antibiotics Sternal incision healing well after delayed closure with excisional debridement of the sternal soft tissues and irrigation.  LOS: 27 days    Kathlee Nationseter Van Trigt III 02/22/2015

## 2015-02-23 ENCOUNTER — Inpatient Hospital Stay (HOSPITAL_COMMUNITY): Payer: Medicaid Other

## 2015-02-23 DIAGNOSIS — T17908A Unspecified foreign body in respiratory tract, part unspecified causing other injury, initial encounter: Secondary | ICD-10-CM | POA: Insufficient documentation

## 2015-02-23 LAB — BLOOD GAS, ARTERIAL
Acid-base deficit: 1.6 mmol/L (ref 0.0–2.0)
Bicarbonate: 22.5 mEq/L (ref 20.0–24.0)
Drawn by: 330991
FIO2: 0.4
MECHVT: 370 mL
O2 Saturation: 98.9 %
PEEP: 5 cmH2O
Patient temperature: 99.8
RATE: 14 resp/min
TCO2: 23.6 mmol/L (ref 0–100)
pCO2 arterial: 38.3 mmHg (ref 35.0–45.0)
pH, Arterial: 7.39 (ref 7.350–7.450)
pO2, Arterial: 141 mmHg — ABNORMAL HIGH (ref 80.0–100.0)

## 2015-02-23 LAB — POCT I-STAT, CHEM 8
BUN: 56 mg/dL — ABNORMAL HIGH (ref 6–20)
CREATININE: 1.7 mg/dL — AB (ref 0.44–1.00)
Calcium, Ion: 1.22 mmol/L (ref 1.13–1.30)
Chloride: 110 mmol/L (ref 101–111)
Glucose, Bld: 96 mg/dL (ref 65–99)
HEMATOCRIT: 32 % — AB (ref 36.0–46.0)
HEMOGLOBIN: 10.9 g/dL — AB (ref 12.0–15.0)
POTASSIUM: 3.7 mmol/L (ref 3.5–5.1)
SODIUM: 146 mmol/L — AB (ref 135–145)
TCO2: 24 mmol/L (ref 0–100)

## 2015-02-23 LAB — GLUCOSE, CAPILLARY
GLUCOSE-CAPILLARY: 151 mg/dL — AB (ref 65–99)
GLUCOSE-CAPILLARY: 91 mg/dL (ref 65–99)
GLUCOSE-CAPILLARY: 138 mg/dL — AB (ref 65–99)
Glucose-Capillary: 109 mg/dL — ABNORMAL HIGH (ref 65–99)
Glucose-Capillary: 121 mg/dL — ABNORMAL HIGH (ref 65–99)
Glucose-Capillary: 135 mg/dL — ABNORMAL HIGH (ref 65–99)
Glucose-Capillary: 70 mg/dL (ref 65–99)

## 2015-02-23 LAB — CULTURE, BLOOD (ROUTINE X 2)
Culture: NO GROWTH
Culture: NO GROWTH

## 2015-02-23 LAB — COMPREHENSIVE METABOLIC PANEL
ALT: 126 U/L — ABNORMAL HIGH (ref 14–54)
AST: 67 U/L — ABNORMAL HIGH (ref 15–41)
Albumin: 2 g/dL — ABNORMAL LOW (ref 3.5–5.0)
Alkaline Phosphatase: 135 U/L — ABNORMAL HIGH (ref 38–126)
Anion gap: 11 (ref 5–15)
BUN: 67 mg/dL — ABNORMAL HIGH (ref 6–20)
CO2: 25 mmol/L (ref 22–32)
Calcium: 8.7 mg/dL — ABNORMAL LOW (ref 8.9–10.3)
Chloride: 108 mmol/L (ref 101–111)
Creatinine, Ser: 1.64 mg/dL — ABNORMAL HIGH (ref 0.44–1.00)
GFR calc Af Amer: 38 mL/min — ABNORMAL LOW (ref >60)
GFR calc non Af Amer: 33 mL/min — ABNORMAL LOW (ref >60)
Glucose, Bld: 112 mg/dL — ABNORMAL HIGH (ref 65–99)
Potassium: 3.3 mmol/L — ABNORMAL LOW (ref 3.5–5.1)
Sodium: 144 mmol/L (ref 135–145)
Total Bilirubin: 3.8 mg/dL — ABNORMAL HIGH (ref 0.3–1.2)
Total Protein: 6.9 g/dL (ref 6.5–8.1)

## 2015-02-23 LAB — CBC
HCT: 25.2 % — ABNORMAL LOW (ref 36.0–46.0)
Hemoglobin: 8.1 g/dL — ABNORMAL LOW (ref 12.0–15.0)
MCH: 28.3 pg (ref 26.0–34.0)
MCHC: 32.1 g/dL (ref 30.0–36.0)
MCV: 88.1 fL (ref 78.0–100.0)
Platelets: 369 10*3/uL (ref 150–400)
RBC: 2.86 MIL/uL — ABNORMAL LOW (ref 3.87–5.11)
RDW: 17.9 % — ABNORMAL HIGH (ref 11.5–15.5)
WBC: 11.3 10*3/uL — ABNORMAL HIGH (ref 4.0–10.5)

## 2015-02-23 LAB — URINE CULTURE
Culture: NO GROWTH
Special Requests: NORMAL

## 2015-02-23 LAB — CARBOXYHEMOGLOBIN
Carboxyhemoglobin: 1.4 % (ref 0.5–1.5)
Methemoglobin: 0.6 % (ref 0.0–1.5)
O2 Saturation: 76.6 %
Total hemoglobin: 8.1 g/dL — ABNORMAL LOW (ref 12.0–16.0)

## 2015-02-23 LAB — PREPARE RBC (CROSSMATCH)

## 2015-02-23 MED ORDER — FUROSEMIDE 10 MG/ML IJ SOLN
40.0000 mg | Freq: Two times a day (BID) | INTRAMUSCULAR | Status: DC
  Administered 2015-02-23 – 2015-02-24 (×3): 40 mg via INTRAVENOUS
  Filled 2015-02-23 (×5): qty 4

## 2015-02-23 MED ORDER — POTASSIUM CHLORIDE 10 MEQ/50ML IV SOLN
10.0000 meq | INTRAVENOUS | Status: AC
  Administered 2015-02-23 (×3): 10 meq via INTRAVENOUS
  Filled 2015-02-23 (×2): qty 50

## 2015-02-23 MED ORDER — FLUCONAZOLE IN SODIUM CHLORIDE 100-0.9 MG/50ML-% IV SOLN
100.0000 mg | INTRAVENOUS | Status: AC
  Administered 2015-02-24 – 2015-02-26 (×3): 100 mg via INTRAVENOUS
  Filled 2015-02-23 (×3): qty 50

## 2015-02-23 MED ORDER — VITAL 1.5 CAL PO LIQD
1000.0000 mL | ORAL | Status: DC
  Administered 2015-02-23 – 2015-02-24 (×2): 1000 mL
  Administered 2015-02-26: 40 mL
  Filled 2015-02-23 (×5): qty 1000

## 2015-02-23 MED ORDER — VANCOMYCIN HCL 500 MG IV SOLR
500.0000 mg | INTRAVENOUS | Status: DC
  Filled 2015-02-23: qty 500

## 2015-02-23 MED ORDER — CLONAZEPAM 1 MG PO TABS
1.0000 mg | ORAL_TABLET | Freq: Two times a day (BID) | ORAL | Status: DC
  Administered 2015-02-23 – 2015-03-13 (×37): 1 mg via ORAL
  Filled 2015-02-23 (×37): qty 1

## 2015-02-23 MED ORDER — VANCOMYCIN HCL IN DEXTROSE 750-5 MG/150ML-% IV SOLN
750.0000 mg | Freq: Once | INTRAVENOUS | Status: AC
  Administered 2015-02-23: 750 mg via INTRAVENOUS
  Filled 2015-02-23: qty 150

## 2015-02-23 MED ORDER — ENOXAPARIN SODIUM 30 MG/0.3ML ~~LOC~~ SOLN
30.0000 mg | SUBCUTANEOUS | Status: DC
  Administered 2015-02-24: 30 mg via SUBCUTANEOUS
  Filled 2015-02-23 (×2): qty 0.3

## 2015-02-23 MED ORDER — MILRINONE IN DEXTROSE 20 MG/100ML IV SOLN: 0.1250 ug/kg/min | INTRAVENOUS | Status: DC

## 2015-02-23 MED ORDER — FLUCONAZOLE IN SODIUM CHLORIDE 200-0.9 MG/100ML-% IV SOLN
200.0000 mg | Freq: Once | INTRAVENOUS | Status: AC
  Administered 2015-02-23: 200 mg via INTRAVENOUS
  Filled 2015-02-23: qty 100

## 2015-02-23 MED ORDER — METOLAZONE 2.5 MG PO TABS
2.5000 mg | ORAL_TABLET | Freq: Every day | ORAL | Status: AC
  Administered 2015-02-23 – 2015-02-24 (×2): 2.5 mg via ORAL
  Filled 2015-02-23 (×2): qty 1

## 2015-02-23 MED ORDER — POTASSIUM CHLORIDE 10 MEQ/50ML IV SOLN
10.0000 meq | INTRAVENOUS | Status: AC
  Administered 2015-02-23 (×3): 10 meq via INTRAVENOUS

## 2015-02-23 MED ORDER — PANTOPRAZOLE SODIUM 40 MG IV SOLR
40.0000 mg | INTRAVENOUS | Status: DC
  Administered 2015-02-23 – 2015-03-03 (×9): 40 mg via INTRAVENOUS
  Filled 2015-02-23 (×9): qty 40

## 2015-02-23 MED ORDER — VANCOMYCIN HCL 500 MG IV SOLR
500.0000 mg | INTRAVENOUS | Status: DC
  Administered 2015-02-24 – 2015-02-25 (×2): 500 mg via INTRAVENOUS
  Filled 2015-02-23 (×3): qty 500

## 2015-02-23 MED ORDER — METOPROLOL TARTRATE 12.5 MG HALF TABLET
12.5000 mg | ORAL_TABLET | Freq: Two times a day (BID) | ORAL | Status: DC
  Administered 2015-02-23 – 2015-03-02 (×15): 12.5 mg via ORAL
  Filled 2015-02-23 (×17): qty 1

## 2015-02-23 MED ORDER — DEXTROSE 50 % IV SOLN
25.0000 mL | Freq: Once | INTRAVENOUS | Status: AC
  Administered 2015-02-23: 25 mL via INTRAVENOUS
  Filled 2015-02-23: qty 50

## 2015-02-23 MED ORDER — ACETAMINOPHEN 160 MG/5ML PO SOLN
650.0000 mg | Freq: Four times a day (QID) | ORAL | Status: DC | PRN
  Administered 2015-02-22 – 2015-03-13 (×9): 650 mg via ORAL
  Filled 2015-02-23 (×9): qty 20.3

## 2015-02-23 MED ORDER — FLUCONAZOLE IN SODIUM CHLORIDE 100-0.9 MG/50ML-% IV SOLN
100.0000 mg | INTRAVENOUS | Status: DC
  Filled 2015-02-23: qty 50

## 2015-02-23 NOTE — Progress Notes (Signed)
Blood transfusing pt remains with stable vs and no s/sx of reaction.

## 2015-02-23 NOTE — Progress Notes (Signed)
Foley bag had a copious leak from the drainage bag, unable to contain. Foley catheter bag replaced seal broken to do so

## 2015-02-23 NOTE — Progress Notes (Signed)
ANTIBIOTIC CONSULT NOTE - FOLLOW UP  Pharmacy Consult for Vancomycin restart Indication:new fevers, HCAP, sternal wound  Assessment: ID: Tmax=101.2, wbc 11(down), concerned for HCAP placed on vanc/fortaz, now concern for aspiration - switched to zosyn. Vancomycin stopped 10/16 but with new fevers this am it has been restarted  Scr 1.6, uop 2.4L (642ml/kg/hr)  Zinacef 10/1>>10/2 (4 doses)  Vanc 9/26>>9/30, restarted 10/3>>10/16, 10/18>> 10/1 VR = 16 (vanc stopped night before)  10/5 VT = 24 (confirmed with RN that she drew it before dose was hung, once level resulted she stopped the infusion so pt only received a little) will extend interval to q24  10/9 VT =22-received dose after level, will again extend to q36h  10/6 Ceftazidime>>10/12  10/12 zosyn >>  9/27 bld cx: 1/1 ng  9/30 wound: ng 10/4 TA - ng 10/12 resp JX:BJYNcx:ngtd 10/13 bld x2:ng 10/17 urine cx: ngtd 10/18 bld x2:ordered this am   Goal of Therapy:  Vancomycin trough level 15-20 mcg/ml  Appropriate Zosyn dosing  Plan:  -Restart Vancomycin 500 mg iv Q 24 hours -will need to check level early as patient has been known to accumulate on similar regimen -Continue Zosyn 3.375 grams iv Q 8 hours (start date 10/11)     Allergies  Allergen Reactions  . Atorvastatin Other (See Comments)    Gas, chest tightness  . Crestor [Rosuvastatin Calcium] Other (See Comments)    Muscle aches   Labs:  Recent Labs  02/21/15 0426  02/21/15 2145 02/22/15 0340 02/23/15 0451  WBC 11.4*  --   --  10.2 11.3*  HGB 8.1*  --   --  7.9* 8.1*  PLT 357  --   --  377 369  CREATININE 1.72*  < > 1.68* 1.64* 1.64*  < > = values in this interval not displayed. Estimated Creatinine Clearance: 25.5 mL/min (by C-G formula based on Cr of 1.64). No results for input(s): VANCOTROUGH, VANCOPEAK, VANCORANDOM, GENTTROUGH, GENTPEAK, GENTRANDOM, TOBRATROUGH, TOBRAPEAK, TOBRARND, AMIKACINPEAK, AMIKACINTROU, AMIKACIN in the last 72 hours.   Thank  you, Breanna Alvarez PharmD., BCPS Clinical Pharmacist Pager [phone] 02/23/2015 9:35 AM

## 2015-02-23 NOTE — Progress Notes (Signed)
PULMONARY / CRITICAL CARE MEDICINE   Name: Breanna Alvarez MRN: [id number] DOB: December 16, 1950    ADMISSION DATE:  01/26/2015 CONSULTATION DATE:  02/09/2105  REFERRING MD :  Donata Clay   CHIEF COMPLAINT:  Vent management   INITIAL PRESENTATION:  64 yo female admitted with chest pain from CAD.  She had CABG 02/01/15 >> post-op complicated by cardiogenic shock s/p centrimag placement and removal, ischemic cardiomyopathy with EF 20%, open sternum.  She remained on vent post-op and PCCM consulted to assist with vent weaning.  STUDIES:  9/21 Echo >> EF 20-25%, mod MR, PA press 51 mmHg 10/15 Echo >> EF 25 to 30%, PAS 36 mmHg  SIGNIFICANT EVENTS: 09/26 CABG x 4, placement of centrimag L ventricular assistive device  09/30 removal centrimag  10/03 sternal closure 10/09 Hg drop overnight by 1 gm and transfusion ordered 10/10 Trach by DF 10/12 Off precedex 10/13 possible aspiration event; TNA started 10/14 difficulty with agitation 10/16 off pressors 10/17 off TNA 10/18 Fever 101, transfuse 1 unit PRBC  SUBJECTIVE: Tolerating pressure support 10/5.  VITAL SIGNS: Temp:  [98.7 F (37.1 C)-101.2 F (38.4 C)] 99 F (37.2 C) (10/18 0800) Pulse Rate:  [95-138] 122 (10/18 0800) Resp:  [13-35] 19 (10/18 0800) BP: (87-160)/(38-82) 139/60 mmHg (10/18 0800) SpO2:  [96 %-100 %] 100 % (10/18 0800) Arterial Line BP: (86-177)/(34-79) 137/62 mmHg (10/18 0800) FiO2 (%):  [40 %] 40 % (10/18 0700) Weight:  [111 lb 1.8 oz (50.4 kg)] 111 lb 1.8 oz (50.4 kg) (10/18 0600)   HEMODYNAMICS: CVP:  [8 mmHg-15 mmHg] 14 mmHg   VENTILATOR SETTINGS: Vent Mode:  [-] PRVC FiO2 (%):  [40 %] 40 % Set Rate:  [14 bmp] 14 bmp Vt Set:  [370 mL] 370 mL PEEP:  [5 cmH20] 5 cmH20 Pressure Support:  [10 cmH20-15 cmH20] 10 cmH20 Plateau Pressure:  [20 cmH20-28 cmH20] 28 cmH20  INTAKE / OUTPUT:  Intake/Output Summary (Last 24 hours) at 02/23/15 0838 Last data filed at 02/23/15 0800  Gross per 24 hour  Intake  3081.6 ml  Output   2995 ml  Net   86.6 ml   PHYSICAL EXAMINATION: General: pleasant Neuro: RASS +1, moves all extremities HEENT: trach site clean Cardiovascular: regular, tachycardic Lungs: b/l crackles, no wheeze Abdomen: soft, non tender Musculoskeletal: 1+ edema  LABS: PULMONARY  Recent Labs Lab 02/19/15 0750 02/19/15 1635  02/20/15 0550 02/21/15 0430 02/21/15 1225 02/22/15 0342 02/22/15 0343 02/23/15 0420 02/23/15 0529  PHART 7.381  --   --  7.275* 7.368  --   --  7.365 7.390  --   PCO2ART 38.1  --   --  50.2* 41.4  --   --  43.6 38.3  --   PO2ART 91.1  --   --  110* 131*  --   --  116* 141*  --   HCO3 21.9  --   --  22.3 23.2  --   --  24.3* 22.5  --   TCO2 23.0 21  --  23.8 24.5  --   --  25.6 23.6  --   O2SAT 96.5  --   < > 97.8 71.5  98.7 78.2 78.2 98.6 98.9 76.6  < > = values in this interval not displayed.  CBC  Recent Labs Lab 02/21/15 0426 02/22/15 0340 02/23/15 0451  HGB 8.1* 7.9* 8.1*  HCT 26.3* 24.8* 25.2*  WBC 11.4* 10.2 11.3*  PLT 357 377 369   CHEMISTRY  Recent Labs Lab 02/18/15 0450  02/19/15 0525  02/20/15 0600 02/21/15 0426 02/21/15 1230 02/21/15 1540 02/21/15 2145 02/22/15 0340 02/23/15 0451  NA 147*  < > 144  < > 142 140 139 137 138 142 144  K 3.3*  < > 3.9  < > 3.5 2.4* 3.4* 2.7* 3.2* 4.1 3.3*  CL 111  < > 111  < > 104 101 101 101 102 106 108  CO2 20*  --  22  --  GLUCOSE 129*  < > 122*  < > 166* 153* 198* 121* 165* 133* 112*  BUN 56*  < > 67*  < > 74* 81* 82* 81* 78* 77* 67*  CREATININE 1.28*  < > 1.54*  < > 1.70* 1.72* 1.78* 1.75* 1.68* 1.64* 1.64*  CALCIUM 8.6*  --  8.7*  --  8.4* 8.0* 8.0* 8.2* 8.3* 8.5* 8.7*  MG 1.9  --  2.4  --  2.2 1.7  --   --   --  2.3  --   PHOS 3.9  --  4.5  --  6.6* 4.6  --   --   --  3.7  --   < > = values in this interval not displayed. Estimated Creatinine Clearance: 25.5 mL/min (by C-G formula based on Cr of 1.64).  LIVER  Recent Labs Lab 02/19/15 0525  02/20/15 0600 02/21/15 0426 02/22/15 0340 02/23/15 0451  AST 100* 64* 80* 67* 67*  ALT 167* 134* 126* 129* 126*  ALKPHOS 155* 154* 151* 132* 135*  BILITOT 2.2* 2.2* 3.3* 3.9* 3.8*  PROT 7.0 6.9 6.4* 6.6 6.9  ALBUMIN 2.4* 2.3* 2.0* 2.0* 2.0*   ENDOCRINE CBG (last 3)   Recent Labs  02/22/15 2358 02/23/15 0051 02/23/15 0358  GLUCAP 70 121* 109*    Iron/TIBC/Ferritin/ %Sat    Component Value Date/Time   IRON 19* 02/22/2015 0900   TIBC 143* 02/22/2015 0900   FERRITIN 1025* 02/22/2015 0900   IRONPCTSAT 13 02/22/2015 0900    IMAGING x48h Dg Chest Port 1 View  02/23/2015  CLINICAL DATA:  CABG.  Shortness of breath. EXAM: PORTABLE CHEST 1 VIEW COMPARISON:  02/22/2015 FINDINGS: Changes of CABG. Tracheostomy tube is unchanged. There is cardiomegaly with diffuse bilateral airspace disease, not significantly changed. No visible effusions or pneumothorax. IMPRESSION: Stable diffuse bilateral airspace disease. Electronically Signed   By: Charlett Nose M.D.   On: 02/23/2015 08:14   Dg Chest Port 1 View  02/22/2015  CLINICAL DATA:  Acute respiratory failure. On ventilator coronary artery disease and ischemic cardiomyopathy. Mitral regurgitation. EXAM: PORTABLE CHEST 1 VIEW COMPARISON:  02/21/2015 FINDINGS: Support apparatus remains in stable position. No pneumothorax visualized. Bilateral heterogeneous pulmonary airspace disease is again seen, worse in the right upper lobe. This shows no significant change. Mild cardiomegaly stable. IMPRESSION: Bilateral pulmonary airspace disease, without significant change. Electronically Signed   By: Myles Rosenthal M.D.   On: 02/22/2015 07:23   Dg Abd Portable 1v  02/22/2015  CLINICAL DATA:  Feeding tube placement. EXAM: PORTABLE ABDOMEN - 1 VIEW COMPARISON:  02/18/2015 FINDINGS: No evidence of dilated bowel loops.  Bowel gas pattern is normal. Feeding tube is seen with tip in the descending portion the duodenum. Nasogastric tube is seen with tip in the  gastric antrum. Temperature probe noted in the urinary bladder. IMPRESSION: Normal bowel gas pattern. Feeding tube tip in descending duodenum and nasogastric tube tip in gastric antrum. Electronically Signed   By: Myles Rosenthal M.D.   On:  02/22/2015 07:30    ASSESSMENT / PLAN:  PULMONARY ETT 9/26 >> 10/07 Trach (DF) 10/10 >> A: Acute on chronic respiratory failure 2nd to acute pulmonary edema/cardiogenic shock after CABG, deconditioning, and aspiration pneumonia. Failure to wean from vent s/p tracheostomy. P:   Pressure support wean to trach collar as tolerated F/u CXR Trach care, bronchial hygiene Prn albuterol  CARDIOVASCULAR Lt PICC 10/03 >>  Lt brachial aline 10/13 >> A: CAD s/p CABG.  Acute on chronic systolic heart failure with ischemic CM. Cardiogenic shock. HLD >> reported muscle aches with Crestor. P:  Milrinone per TCTS and cardiology Continue ASA, lopressor Defer to TCTS and cardiology about adding lipid lowering agent, ACE inhibitor  RENAL A: AKI in setting of cardiogenic shock >> baseline creatinine 0.93 from 10/01/14 Hypokalemia. P:   Monitor renal fx, urine outpt Replace electrolytes as needed  GASTROINTESTINAL A: Protein calorie malnutrition. Elevated LFT's. P:   Tube feeds F/u LFTs intermittently Protonix for SUP Plan for PEG placement >> defer to TCTS  HEMATOLOGIC A: Anemia of critical illness. P:  F/u CBC Lovenox for DVT prevention  INFECTIOUS A: Aspiration pneumonia 10/13. P:   Day 7 of zosyn F/u procalcitonin to assist with Abx duration  Blood 10/13 >> Blood 10/18 >> Sputum 10/18 >>  ENDOCRINE A: Hyperglycemia  P:   SSI  NEUROLOGIC A: Agitated delirium. Deconditioning. P:   Increase klonopin to 1 mg bid on 10/18 Continue seroquel Fentanyl gtt PRN versed RASS goal 0 Defer to TCTS about timing of mobilizing pt and working with physical therapy  DISPOSITION: Likely will eventually need LTAC to assist with rehab and  vent weaning.  D/w Dr. Donata ClayVan Trigt.  Coralyn HellingVineet Maysun Meditz, MD [facility] 02/23/2015, 8:38 AM Pager:  [phone] After 3pm call: [phone]

## 2015-02-23 NOTE — Progress Notes (Signed)
eLink Physician-Brief Progress Note Patient Name: Salli QuarryMaria T Minarik DOB: August 24, 1950 MRN: [id number]   Date of Service  02/23/2015  HPI/Events of Note  RN notified of fever. Urine & Resp Ctx done AM 10/17. On Zosyn.  eICU Interventions  Tylenol via NGT. Blood Ctx x2.     Intervention Category Intermediate Interventions: Other:  Lawanda CousinsJennings Asha Grumbine 02/23/2015, 2:09 AM

## 2015-02-23 NOTE — Progress Notes (Signed)
Advanced Heart Failure Rounding Note   Subjective:    S/P CABG with Centrimag placement 02/01/15  Centrimag explanted 9/30.   Sternal Closure 10/3.    10/7 Extubated, reintubated 2 hours later.   Awake on vent (trach). Back on lasix. Continues on milrinone 0.25 mcg. Todays CO-OX is 78%. CVP 14. Getting blood. Continues with low grade temps. On broad spectrum abx and anti-fungal coverage.    ECHO 10/15 EF 25-30%  RV not visualized. Peak PA pressure 36 mm hg.     Objective:   Weight Range:  Vital Signs:   Temp:  [98.7 F (37.1 C)-101.2 F (38.4 C)] 98.8 F (37.1 C) (10/18 1130) Pulse Rate:  [33-138] 99 (10/18 1142) Resp:  [16-35] 16 (10/18 1142) BP: (100-160)/(40-82) 134/71 mmHg (10/18 1142) SpO2:  [94 %-100 %] 100 % (10/18 1142) Arterial Line BP: (86-177)/(34-79) 131/62 mmHg (10/18 1130) FiO2 (%):  [40 %] 40 % (10/18 1142) Weight:  [50.4 kg (111 lb 1.8 oz)] 50.4 kg (111 lb 1.8 oz) (10/18 0600) Last BM Date: 02/23/15 (flexiseal)  Weight change: Filed Weights   02/21/15 0600 02/22/15 0400 02/23/15 0600  Weight: 50 kg (110 lb 3.7 oz) 48.2 kg (106 lb 4.2 oz) 50.4 kg (111 lb 1.8 oz)    Intake/Output:   Intake/Output Summary (Last 24 hours) at 02/23/15 1212 Last data filed at 02/23/15 1100  Gross per 24 hour  Intake 3758.2 ml  Output   3220 ml  Net  538.2 ml     Physical Exam:  CVP 14 General:  On trach. Awake. HEENT: normal x ETT. Neck: supple. + trach Cor: Chest with dressing. Tachy regular Lungs: CTA Abdomen: soft, nontender, nondistended. Hypoactive bowel sounds. Extremities: no cyanosis, clubbing, rash,  1+ LE edema in thighs. SCDs in place.R and LLE heel boots on.  Neuro: awake on vent    Telemetry: Sinus tach ~100  Labs: Basic Metabolic Panel:  Recent Labs Lab 02/18/15 0450  02/19/15 0525  02/20/15 0600 02/21/15 0426 02/21/15 1230 02/21/15 1540 02/21/15 2145 02/22/15 0340 02/23/15 0451  NA 147*  < > 144  < > 142 140 139 137 138 142  144  K 3.3*  < > 3.9  < > 3.5 2.4* 3.4* 2.7* 3.2* 4.1 3.3*  CL 111  < > 111  < > 104 101 101 101 102 106 108  CO2 20*  --  22  --  GLUCOSE 129*  < > 122*  < > 166* 153* 198* 121* 165* 133* 112*  BUN 56*  < > 67*  < > 74* 81* 82* 81* 78* 77* 67*  CREATININE 1.28*  < > 1.54*  < > 1.70* 1.72* 1.78* 1.75* 1.68* 1.64* 1.64*  CALCIUM 8.6*  --  8.7*  --  8.4* 8.0* 8.0* 8.2* 8.3* 8.5* 8.7*  MG 1.9  --  2.4  --  2.2 1.7  --   --   --  2.3  --   PHOS 3.9  --  4.5  --  6.6* 4.6  --   --   --  3.7  --   < > = values in this interval not displayed.  Liver Function Tests:  Recent Labs Lab 02/19/15 0525 02/20/15 0600 02/21/15 0426 02/22/15 0340 02/23/15 0451  AST 100* 64* 80* 67* 67*  ALT 167* 134* 126* 129* 126*  ALKPHOS 155* 154* 151* 132* 135*  BILITOT 2.2* 2.2* 3.3* 3.9* 3.8*  PROT 7.0 6.9  6.4* 6.6 6.9  ALBUMIN 2.4* 2.3* 2.0* 2.0* 2.0*   No results for input(s): LIPASE, AMYLASE in the last 168 hours. No results for input(s): AMMONIA in the last 168 hours.  CBC:  Recent Labs Lab 02/19/15 0525 02/19/15 1635 02/20/15 0600 02/21/15 0426 02/22/15 0340 02/23/15 0451  WBC 15.9*  --  15.3* 11.4* 10.2 11.3*  NEUTROABS  --   --   --   --  7.6  --   HGB 9.0* 10.2* 8.6* 8.1* 7.9* 8.1*  HCT 27.9* 30.0* 28.1* 26.3* 24.8* 25.2*  MCV 88.0  --  91.2 88.9 88.3 88.1  PLT 459*  --  419* 357 377 369    Cardiac Enzymes: No results for input(s): CKTOTAL, CKMB, CKMBINDEX, TROPONINI in the last 168 hours.  BNP: BNP (last 3 results)  Recent Labs  07/10/14 1315  BNP 1059.1*    ProBNP (last 3 results) No results for input(s): PROBNP in the last 8760 hours.    Other results:  Imaging: Dg Chest Port 1 View  02/23/2015  CLINICAL DATA:  CABG.  Shortness of breath. EXAM: PORTABLE CHEST 1 VIEW COMPARISON:  02/22/2015 FINDINGS: Changes of CABG. Tracheostomy tube is unchanged. There is cardiomegaly with diffuse bilateral airspace disease, not significantly changed. No  visible effusions or pneumothorax. IMPRESSION: Stable diffuse bilateral airspace disease. Electronically Signed   By: Charlett NoseKevin  Dover M.D.   On: 02/23/2015 08:14   Dg Chest Port 1 View  02/22/2015  CLINICAL DATA:  Acute respiratory failure. On ventilator coronary artery disease and ischemic cardiomyopathy. Mitral regurgitation. EXAM: PORTABLE CHEST 1 VIEW COMPARISON:  02/21/2015 FINDINGS: Support apparatus remains in stable position. No pneumothorax visualized. Bilateral heterogeneous pulmonary airspace disease is again seen, worse in the right upper lobe. This shows no significant change. Mild cardiomegaly stable. IMPRESSION: Bilateral pulmonary airspace disease, without significant change. Electronically Signed   By: Myles RosenthalJohn  Stahl M.D.   On: 02/22/2015 07:23   Dg Abd Portable 1v  02/22/2015  CLINICAL DATA:  Feeding tube placement. EXAM: PORTABLE ABDOMEN - 1 VIEW COMPARISON:  02/18/2015 FINDINGS: No evidence of dilated bowel loops.  Bowel gas pattern is normal. Feeding tube is seen with tip in the descending portion the duodenum. Nasogastric tube is seen with tip in the gastric antrum. Temperature probe noted in the urinary bladder. IMPRESSION: Normal bowel gas pattern. Feeding tube tip in descending duodenum and nasogastric tube tip in gastric antrum. Electronically Signed   By: Myles RosenthalJohn  Stahl M.D.   On: 02/22/2015 07:30     Medications:     Scheduled Medications: . antiseptic oral rinse  7 mL Mouth Rinse QID  . aspirin EC  325 mg Oral Daily   Or  . aspirin  324 mg Per Tube Daily  . chlorhexidine gluconate  15 mL Mouth Rinse BID  . clonazePAM  1 mg Oral BID  . enoxaparin (LOVENOX) injection  30 mg Subcutaneous Q24H  . [START ON 02/24/2015] fluconazole (DIFLUCAN) IV  100 mg Intravenous Q24H  . furosemide  40 mg Intravenous Q12H  . hydrALAZINE  10 mg Oral 3 times per day  . insulin aspart  0-24 Units Subcutaneous 6 times per day  . lactobacillus acidophilus  2 tablet Oral BID  . metolazone  2.5  mg Oral Daily  . metoprolol tartrate  12.5 mg Oral BID  . mupirocin ointment  1 application Topical BID  . nitroGLYCERIN  0.4 mg Transdermal Daily  . pantoprazole (PROTONIX) IV  40 mg Intravenous Q24H  . piperacillin-tazobactam (ZOSYN)  IV  3.375 g Intravenous Q8H  . QUEtiapine  100 mg Oral QHS  . sodium chloride  10-40 mL Intracatheter Q12H  . sodium chloride  3 mL Intravenous Q12H  . [START ON 02/24/2015] vancomycin  500 mg Intravenous Q24H    Infusions: . sodium chloride 10 mL/hr at 02/22/15 1200  . dextrose 10 mL/hr at 02/22/15 1200  . feeding supplement (VITAL 1.5 CAL) 1,000 mL (02/23/15 0842)  . fentaNYL infusion INTRAVENOUS 350 mcg/hr (02/23/15 0848)  . milrinone 0.25 mcg/kg/min (02/22/15 2156)    PRN Medications: Place/Maintain arterial line **AND** sodium chloride, acetaminophen (TYLENOL) oral liquid 160 mg/5 mL, albuterol, fentaNYL, magic mouthwash, midazolam, ondansetron (ZOFRAN) IV, potassium chloride, sodium chloride, sodium chloride   Assessment:   1. Cardiogenic shock   --s/p CABG with centrimag placement 02/01/15. Centrimag removed 9/30 2. CAD s/p CABG 3. 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 Acute respiratory failure. S/P Trach  5. Acute blood loss anemia 6. Thrombocytopenia: HIT negative by SRA 7. Hyponatremia 8. Hypokalemia  Plan/Discussion:    Not making much progress. Now with recurrent fevers. On trach. Pending PEG. May need to consider Palliative Care involvement. If not, will certainly need LTAC.   Agree with diuresis. Will begin to wean milrinone.   Bensimhon, Daniel,MD 12:12 PM

## 2015-02-23 NOTE — Progress Notes (Addendum)
See IR consult note. Husband Leonette MostCharles now at bedside, able to discuss G-tube procedure. Risks and Benefits discussed with the patient including, but not limited to the need for a barium enema during the procedure, bleeding, infection, peritonitis, or damage to adjacent structures. All of the patient's questions were answered, patient is agreeable to proceed. Consent signed and in chart.  Brayton ElKevin Berna Gitto PA-C Interventional Radiology 02/23/2015 4:28 PM

## 2015-02-23 NOTE — Progress Notes (Signed)
Per RN re: trach sutures- states that Dr Craige CottaSood said that trach sutures may come out in "a day or two", but no plans to d/c sutures today.

## 2015-02-23 NOTE — Consult Note (Signed)
Chief Complaint: Patient was seen in consultation today for percutaneous gastric tube placement at the request of Dr Donata Clay  Referring Physician(s): Dr Maren Beach  History of Present Illness: Breanna Alvarez is a 64 y.o. female   CABG 02/01/2015 with LVAD Cardiogenic shock Vent/trach Aspiration - poss pna Now on antibx x 2 Low grad 101.2 10/18 early am--afeb since Protein calorie malnutrition Severe swallow dysfunction/aspiration Feeding tube in place Request for percutaneous gastric tube placement Dr Fredia Sorrow has reviewed imaging and approves procedure Now scheduled as soon as can   Past Medical History  Diagnosis Date  . Hyperlipidemia   . Acute systolic CHF (congestive heart failure), NYHA class 3 (HCC) 07/10/2014  . Hypertension     Past Surgical History  Procedure Laterality Date  . No past surgeries    . Left heart catheterization with coronary angiogram N/A 07/13/2014    Procedure: LEFT HEART CATHETERIZATION WITH CORONARY ANGIOGRAM;  Surgeon: Corky Crafts, MD; mLAD 95%, dLAD 95%, D1 95%, CFX severe dz, mRCA 80%  . Coronary artery bypass graft N/A 02/01/2015    Procedure: CORONARY ARTERY BYPASS GRAFTING (CABG) X 4 UTILIZING THE LEFT INTERNAL MAMMARY ARTERY TO LAD, ENDOSCOPICALLY HARVESTED BILATERAL SAPHENEOUS VEIN GRAFTS  TO DIAGONAL, OM AND PD.;  Surgeon: Kerin Perna, MD;  Location: [facility] OR;  Service: Open Heart Surgery;  Laterality: N/A;  . Tee without cardioversion N/A 02/01/2015    Procedure: TRANSESOPHAGEAL ECHOCARDIOGRAM (TEE);  Surgeon: Kerin Perna, MD;  Location: [facility] OR;  Service: Open Heart Surgery;  Laterality: N/A;  . Placement of centrimag ventricular assist device N/A 02/01/2015    Procedure: PLACEMENT OF CENTRIMAG VENTRICULAR ASSIST DEVICE;  Surgeon: Kerin Perna, MD;  Location: [facility] OR;  Service: Open Heart Surgery;  Laterality: N/A;  . Removal of centrimag ventricular assist device N/A 02/05/2015    Procedure: REMOVAL OF CENTRIMAG  VENTRICULAR ASSIST DEVICE;  Surgeon: Kerin Perna, MD;  Location: [facility] OR;  Service: Open Heart Surgery;  Laterality: N/A;  . Cannulation for cardiopulmonary bypass N/A 02/05/2015    Procedure: CANNULATION FOR CARDIOPULMONARY BYPASS;  Surgeon: Kerin Perna, MD;  Location: [facility] OR;  Service: Open Heart Surgery;  Laterality: N/A;  . Tee without cardioversion N/A 02/05/2015    Procedure: TRANSESOPHAGEAL ECHOCARDIOGRAM (TEE);  Surgeon: Kerin Perna, MD;  Location: [facility] OR;  Service: Open Heart Surgery;  Laterality: N/A;  . Sternal closure N/A 02/08/2015    Procedure: STERNAL CLOSURE;  Surgeon: Kerin Perna, MD;  Location: [facility] OR;  Service: Thoracic;  Laterality: N/A;  . Tee without cardioversion N/A 02/08/2015    Procedure: TRANSESOPHAGEAL ECHOCARDIOGRAM (TEE);  Surgeon: Kerin Perna, MD;  Location: [facility] OR;  Service: Thoracic;  Laterality: N/A;    Allergies: Atorvastatin and Crestor  Medications: Prior to Admission medications   Medication Sig Start Date End Date Taking? Authorizing Provider  aspirin 81 MG chewable tablet Chew 1 tablet (81 mg total) by mouth daily. 07/15/14  Yes Luke K Kilroy, PA-C  isosorbide mononitrate (IMDUR) 30 MG 24 hr tablet Take 1 tablet (30 mg total) by mouth 2 (two) times daily. 01/04/15  Yes Luke K Kilroy, PA-C  lisinopril (PRINIVIL,ZESTRIL) 2.5 MG tablet Take 1 tablet (2.5 mg total) by mouth daily. 07/15/14  Yes Luke K Kilroy, PA-C  metoprolol (LOPRESSOR) 50 MG tablet Take 1 tablet (50 mg total) by mouth 2 (two) times daily. Patient taking differently: Take 12.5 mg by mouth 2 (two) times daily.  10/07/14  Yes Donnie Coffin  Eldridge Dace, MD  nitroGLYCERIN (NITROSTAT) 0.4 MG SL tablet Place 1 tablet (0.4 mg total) under the tongue every 5 (five) minutes as needed for chest pain. 01/04/15  Yes Luke K Kilroy, PA-C  pravastatin (PRAVACHOL) 80 MG tablet Take 1 tablet (80 mg total) by mouth every evening. Patient taking differently: Take 40 mg by mouth every evening.  10/19/14  Yes  Corky Crafts, MD     Family History  Problem Relation Age of Onset  . Heart attack Mother     MI when she was 60-70s, died at age 44  . Stroke Father     died when patient was 42 years old    Social History   Social History  . Marital Status: Married    Spouse Name: N/A  . Number of Children: N/A  . Years of Education: N/A   Social History Main Topics  . Smoking status: Never Smoker   . Smokeless tobacco: Never Used  . Alcohol Use: No  . Drug Use: No  . Sexual Activity: Not Asked   Other Topics Concern  . None   Social History Narrative    Review of Systems: A 12 point ROS discussed and pertinent positives are indicated in the HPI above.  All other systems are negative.  Review of Systems  Constitutional: Negative for fever.    Vital Signs: BP 85/41 mmHg  Pulse 98  Temp(Src) 98.3 F (36.8 C) (Core (Comment))  Resp 15  Ht 5' (1.524 m)  Wt 111 lb 1.8 oz (50.4 kg)  BMI 21.70 kg/m2  SpO2 100%  Physical Exam  Cardiovascular: Normal rate and regular rhythm.   Pulmonary/Chest:  vent  Musculoskeletal:  No response  Skin: Skin is warm.  Psychiatric:  Need consent from family  Nursing note and vitals reviewed.   Mallampati Score:  MD Evaluation Airway: Other (comments) Airway comments: vent/trach Heart: WNL Abdomen: WNL Chest/ Lungs: Other (comments) Chest/ lungs comments: vent ASA  Classification: 3 Mallampati/Airway Score: Three  Imaging: Dg Orthopantogram  01/27/2015  CLINICAL DATA:  Preoperative for cardiac surgery. EXAM: ORTHOPANTOGRAM/PANORAMIC COMPARISON:  None. FINDINGS: Mandible unremarkable.  No periapical lucencies or large cavities. IMPRESSION: No significant abnormality identified. Electronically Signed   By: Gaylyn Rong M.D.   On: 01/27/2015 10:00   Dg Chest 2 View  01/30/2015  CLINICAL DATA:  Acute MI.  Preop CABG EXAM: CHEST  2 VIEW COMPARISON:  01/27/2015 FINDINGS: Increasing density in the right upper lobe, heart is  mildly enlarged. Lungs are clear. No effusions or edema. No acute bony abnormality. IMPRESSION: Cardiomegaly.  No active disease. Electronically Signed   By: Charlett Nose M.D.   On: 01/30/2015 17:21   Dg Chest 2 View  01/27/2015  CLINICAL DATA:  Congestive heart failure.  Preoperative assessment. EXAM: CHEST  2 VIEW COMPARISON:  07/14/2014 FINDINGS: Mild enlargement of the cardiopericardial silhouette, without edema. Tortuous thoracic aorta. The lungs appear clear. No pleural effusion. IMPRESSION: 1. Mild cardiomegaly, without edema.  The lungs appear clear. Electronically Signed   By: Gaylyn Rong M.D.   On: 01/27/2015 09:59   Dg Abd 1 View  02/17/2015  CLINICAL DATA:  Feeding tube placement. EXAM: ABDOMEN - 1 VIEW COMPARISON:  02/17/2015 at 9:23 a.m. FINDINGS: The curvature of the feeding tube suggests that its tip is in the distal third part of the duodenum. Epicardial pacer leads observed. Left basilar airspace opacity. Prior CABG. Bowel gas pattern unremarkable. IMPRESSION: 1. Curvature the feeding tube suggests that its tip is in  the distal third part of the duodenum. 2. Left basilar airspace opacity. Electronically Signed   By: Gaylyn Rong M.D.   On: 02/17/2015 12:25   Dg Abd 1 View  02/10/2015  CLINICAL DATA:  Encounter for feeding tube placement EXAM: ABDOMEN - 1 VIEW COMPARISON:  Portable exam 1449 hours compared to 02/10/2015 abdominal radiograph. FINDINGS: Small amount of contrast has been injected into the feeding tube. Tip of tube is beyond ligament of Treitz. Contrast opacifies proximal jejunal loops. No bowel wall thickening identified. IMPRESSION: Tip of feeding tube is beyond the ligament of Treitz in the proximal jejunum. Electronically Signed   By: Ulyses Southward M.D.   On: 02/10/2015 15:25   Dg Chest Port 1 View  02/23/2015  CLINICAL DATA:  CABG.  Shortness of breath. EXAM: PORTABLE CHEST 1 VIEW COMPARISON:  02/22/2015 FINDINGS: Changes of CABG. Tracheostomy tube is  unchanged. There is cardiomegaly with diffuse bilateral airspace disease, not significantly changed. No visible effusions or pneumothorax. IMPRESSION: Stable diffuse bilateral airspace disease. Electronically Signed   By: Charlett Nose M.D.   On: 02/23/2015 08:14   Dg Chest Port 1 View  02/22/2015  CLINICAL DATA:  Acute respiratory failure. On ventilator coronary artery disease and ischemic cardiomyopathy. Mitral regurgitation. EXAM: PORTABLE CHEST 1 VIEW COMPARISON:  02/21/2015 FINDINGS: Support apparatus remains in stable position. No pneumothorax visualized. Bilateral heterogeneous pulmonary airspace disease is again seen, worse in the right upper lobe. This shows no significant change. Mild cardiomegaly stable. IMPRESSION: Bilateral pulmonary airspace disease, without significant change. Electronically Signed   By: Myles Rosenthal M.D.   On: 02/22/2015 07:23   Dg Chest Port 1 View  02/21/2015  CLINICAL DATA:  Respiratory distress EXAM: PORTABLE CHEST 1 VIEW COMPARISON:  Yesterday FINDINGS: Tubular device is stable. Extensive airspace disease throughout the left lung has improved. Airspace disease throughout the right lung with an upper lobe distribution is stable. No pneumothorax. Mild cardiomegaly. IMPRESSION: Bilateral airspace disease is improved on the left but stable on the right. Electronically Signed   By: Jolaine Click M.D.   On: 02/21/2015 08:46   Dg Chest Port 1 View  02/20/2015  CLINICAL DATA:  Shortness of breath. EXAM: PORTABLE CHEST 1 VIEW COMPARISON:  1 day prior FINDINGS: Tracheostomy appropriately positioned. Feeding tube and nasogastric tubes extends beyond the inferior aspect of the film. Left-sided PICC line terminates at the low SVC. Patient rotated to the right. Right hemidiaphragm elevation. Cardiomegaly accentuated by AP portable technique. No pleural effusion or pneumothorax. Relatively diffuse interstitial and airspace opacities. Similar on the right and slightly progressive on  the left. IMPRESSION: Worsened left-sided aeration. Otherwise similar interstitial and airspace disease which could represent pulmonary edema and/or infection. Electronically Signed   By: Jeronimo Greaves M.D.   On: 02/20/2015 09:48   Dg Chest Port 1 View  02/19/2015  CLINICAL DATA:  Mitral valve replacement, shortness of breath EXAM: PORTABLE CHEST 1 VIEW COMPARISON:  02/18/2015 FINDINGS: Endotracheal tube is 4.8 cm above the carina. NG tube and feeding tube extend into the stomach. Postop changes from median sternotomy. Stable cardiomegaly with slight improvement in the asymmetric bilateral airspace disease, but still worse in the right upper lobe. No enlarging effusion or pneumothorax. Left PICC line tip in the lower SVC. IMPRESSION: Minimal improvement in asymmetric patchy bilateral airspace process compatible with bilateral pneumonia versus asymmetric alveolar edema. No enlarging effusion or pneumothorax Stable cardiomegaly Electronically Signed   By: Judie Petit.  Shick M.D.   On: 02/19/2015 08:04   Dg  Chest Port 1 View  02/18/2015  ADDENDUM REPORT: 02/18/2015 09:12 ADDENDUM: The nurse for this patient, Vernona Rieger, called me and asked about the dictated report from 02/18/2015. An error obviously was made in the dictation, possibly having viewed another patient's chest x-ray at that time. The current dictation is as follows: Compared to the chest x-ray of 02/17/2015, there has been worsening of airspace disease in the right upper and mid lung field suspicious for pneumonia. Also there appears to be edema present with cardiomegaly and possible effusions, indicative of CHF. Tracheostomy, left central venous line, and feeding tube remain. IMPRESSION: Worsening of airspace disease throughout the right lung most consistent with pneumonia. Probable superimposed CHF as well. Electronically Signed   By: Dwyane Dee M.D.   On: 02/18/2015 09:12  02/18/2015  EXAM: PORTABLE CHEST 1 VIEW COMPARISON:  None. FINDINGS: The heart size  and mediastinal contours are within normal limits. Both lungs are clear. The visualized skeletal structures are unremarkable. IMPRESSION: No active disease. Electronically Signed: By: Dwyane Dee M.D. On: 02/18/2015 07:58   Dg Chest Port 1 View  02/17/2015  CLINICAL DATA:  New onset shortness of breath.  Weakness. EXAM: PORTABLE CHEST 1 VIEW COMPARISON:  One day prior FINDINGS: Tracheostomy appropriately positioned. Feeding tube extends beyond the inferior aspect of the film. Left-sided PICC line terminates at the low SVC. Prior median sternotomy. Cardiomegaly accentuated by AP portable technique. Layering bilateral pleural effusions. No pneumothorax. Interstitial edema is moderate and slightly increased. Lower lobe predominant airspace disease is worse on the right. IMPRESSION: Slight worsening aeration. Increased congestive heart failure with layering bilateral pleural effusions. Progressive bibasilar airspace disease which could represent atelectasis or infection/ aspiration. Electronically Signed   By: Jeronimo Greaves M.D.   On: 02/17/2015 08:34   Dg Chest Port 1 View  02/16/2015  CLINICAL DATA:  Pulmonary edema. EXAM: PORTABLE CHEST 1 VIEW COMPARISON:  02/15/2015. FINDINGS: Tracheostomy tube, feeding tube, left PICC line stable position. Prior CABG. Cardiomegaly. Interim slight clearing of bilateral interstitial edema. Small bilateral pleural effusions have improved. Stable left apical density most likely related to pleural parenchymal scarring and/or atelectasis . No pneumothorax. IMPRESSION: 1. Lines and tubes in stable position. 2. Prior CABG. Cardiomegaly. Interim partial clearing of pulmonary interstitial edema and pleural effusion. Electronically Signed   By: Maisie Fus  Register   On: 02/16/2015 08:08   Dg Chest Port 1 View  02/15/2015  CLINICAL DATA:  Tracheostomy placement EXAM: PORTABLE CHEST 1 VIEW COMPARISON:  Chest radiograph from earlier today. FINDINGS: Tracheostomy tube tip overlies the  tracheal air column the just below thoracic inlet. Stable median sternotomy wires, midline skin staples and enteric tube entering the chest with the tip not seen on this image. Stable left PICC with tip in the lower third of the superior vena cava. No pneumothorax. Stable small bilateral pleural effusions. Stable cardiomediastinal silhouette with mild cardiomegaly. Mild to moderate pulmonary edema, not appreciably changed. Bibasilar lung opacities likely representing atelectasis, not appreciably changed. IMPRESSION: 1. Well-positioned tracheostomy tube. 2. Stable mild to moderate congestive heart failure. 3. Stable small bilateral pleural effusions with bibasilar lung opacities likely representing atelectasis. Electronically Signed   By: Delbert Phenix M.D.   On: 02/15/2015 13:27   Dg Chest Port 1 View  02/15/2015  CLINICAL DATA:  Intubation. EXAM: PORTABLE CHEST 1 VIEW COMPARISON:  None. FINDINGS: Endotracheal tube and feeding tube in stable position. Left PICC line stable position. Prior CABG. Cardiomegaly with interim resolution of pulmonary venous congestion. Mild residual basilar interstitial prominence  and small left pleural effusion. No pneumothorax. IMPRESSION: 1. Lines and tubes in stable position. 2. Cardiomegaly. Interim clearing of pulmonary venous congestion. Mild residual basilar interstitial prominence and small left pleural effusion. Findings consistent with partial clearing of congestive heart failure. Electronically Signed   By: Maisie Fushomas  Register   On: 02/15/2015 07:18   Dg Chest Port 1 View  02/14/2015  CLINICAL DATA:  Endotracheal tube placement. EXAM: PORTABLE CHEST 1 VIEW COMPARISON:  February 12, 2015. FINDINGS: Status post coronary artery bypass graft. Stable cardiomegaly with central pulmonary vascular congestion and probable perihilar and basilar edema. Endotracheal and nasogastric tubes are unchanged in position. Left-sided PICC line is noted with distal tip now seen in the right atrium.  No pneumothorax is noted. No significant pleural effusion is noted. Bony thorax is unremarkable. IMPRESSION: Stable cardiomegaly with central pulmonary vascular congestion and probable bilateral perihilar edema. Left-sided PICC line is now noted with tip in right atrium. Withdrawal by 2-3 cm is recommended. These results will be called to the ordering clinician or representative by the Radiologist Assistant, and communication documented in the PACS or zVision Dashboard. Electronically Signed   By: Lupita RaiderJames  Green Jr, M.D.   On: 02/14/2015 07:35   Dg Chest Port 1 View  02/12/2015  CLINICAL DATA:  Status post endotracheal tube placement EXAM: PORTABLE CHEST - 1 VIEW COMPARISON:  02/12/2015 FINDINGS: Cardiac shadow is mildly prominent. A left-sided PICC line is again seen in satisfactory position. Feeding catheter is noted in the stomach. Endotracheal tube is seen with the tip 2.2 cm above the carina. Patchy changes are again identified throughout both lungs. No new focal abnormality is seen. IMPRESSION: Tubes and lines as described. Bilateral infiltrates similar to that seen on the prior exam. Electronically Signed   By: Alcide CleverMark  Lukens M.D.   On: 02/12/2015 14:36   Dg Chest Port 1 View  02/12/2015  CLINICAL DATA:  Status post CABG on February 01, 2015 EXAM: PORTABLE CHEST 1 VIEW COMPARISON:  Portable chest x-ray of February 11, 2015. FINDINGS: The lungs are adequately inflated. The interstitial markings remain increased. There is subtle airspace opacity in the right mid lung. Minimal left lower lobe subsegmental atelectasis persists. There is a is trace of pleural fluid on the left. The cardiac silhouette remains enlarged. The pulmonary vascularity is more normal today. The endotracheal tube tip lies 4.4 cm above the carina. The esophagogastric tube and feeding tube tips project below the inferior margin of the image. The left-sided PICC line tip projects at the junction of the middle and distal thirds of the SVC.  There are 7 intact sternal wires. IMPRESSION: Slight interval improvement in the appearance of the pulmonary interstitium. Persistent mild enlargement of the cardiac silhouette. Stable right midlung airspace opacity and mild left lower lobe atelectasis. Electronically Signed   By: David  SwazilandJordan M.D.   On: 02/12/2015 07:50   Dg Chest Portable 1 View  02/11/2015  CLINICAL DATA:  CABG.  Respiratory failure. EXAM: PORTABLE CHEST 1 VIEW COMPARISON:  02/10/2015. FINDINGS: Endotracheal tube, NG tube, feeding tube, right IJ sheath, left subclavian central line in stable position. Interim removal of Swan-Ganz catheter. Interim removal of bilateral chest tubes. Small pleural density noted at the site of the left chest tube. Cardiomegaly with mild pulmonary interstitial prominence suggesting mild congestive heart failure. IMPRESSION: 1. Interim removal of bilateral chest tubes and Swan-Ganz catheter. Remaining lines and tubes in stable position. No pneumothorax. 2. Prior CABG. Cardiomegaly with new onset of mild pulmonary interstitial prominence suggesting  a mild component of congestive heart failure. Electronically Signed   By: Maisie Fus  Register   On: 02/11/2015 07:33   Dg Chest Port 1 View  02/10/2015  CLINICAL DATA:  Shortness of breath. EXAM: PORTABLE CHEST 1 VIEW COMPARISON:  02/09/2015. FINDINGS: Endotracheal tube, NG tube, Swan-Ganz catheter, left PICC line, bilateral chest tubes in stable position. Interim removal of mediastinal drainage catheters. Prior CABG. Heart size stable. No pulmonary venous congestion. Low lung volumes with mild basilar atelectasis. No pleural effusion or pneumothorax. IMPRESSION: 1. Interim removal of mediastinal drainage catheters. Remaining lines and tubes including bilateral chest tubes in stable position. No pneumothorax. 2. Prior CABG.  Heart size stable.  No pulmonary venous congestion. 3. Low lung volumes. Electronically Signed   By: Maisie Fus  Register   On: 02/10/2015 08:10   Dg  Chest Port 1 View  02/09/2015  CLINICAL DATA:  Status post CABG and subsequent left ventricular assist device placement and removal EXAM: PORTABLE CHEST 1 VIEW COMPARISON:  Portable chest x-ray of February 08, 2015 FINDINGS: The lungs are adequately inflated. The interstitial markings bilaterally are less conspicuous today. On the right the chest tube is unchanged with its tip projecting over the posterior lateral aspect of the fourth rib. On the left the upper chest tube is stable with the tip projecting over the lateral aspect of the fourth and fifth rib interspace. The lower chest tube tip projects over the medial aspect of the ninth and tenth rib interspace. There is minimal left lower lobe atelectasis remaining as well as a trace of pleural fluid at the left lung base. There is no pneumothorax. The mediastinal drain is unchanged lying to the left of midline. The cardiac silhouette remains mildly enlarged. The pulmonary vascularity is less engorged today. The Swan-Ganz catheter tip projects in the distal main pulmonary artery. The endotracheal tube tip lies 4.3 cm above the carina. The esophagogastric tube tip projects below the inferior margin of the image. There are 7 intact sternal wires. IMPRESSION: Ongoing improvement in the appearance of the pulmonary interstitium with decreasing pulmonary interstitial edema. There is no large pleural effusion and no pneumothorax. The support tubes are in reasonable position. Electronically Signed   By: David  Sw[place]iland M.D.   On: 02/09/2015 07:37   Dg Chest Portable 1 View  02/08/2015  CLINICAL DATA:  Postop coronary bypass EXAM: PORTABLE CHEST 1 VIEW COMPARISON:  02/08/2015 FINDINGS: Endotracheal tube at the T2 level approximately 8 cm above the carina. Swan-Ganz catheter remains in the central pulmonary outflow tract from a right IJ approach. NG tube within the stomach. Mediastinal drains and bilateral chest tubes remain. Stable cardiomegaly with mild central vascular  congestion and left base atelectasis. No developing effusion or pneumothorax. IMPRESSION: Stable postoperative findings. Mild cardiomegaly with vascular congestion and basilar atelectasis. No developing effusion or pneumothorax. Electronically Signed   By: Judie Petit.  Shick M.D.   On: 02/08/2015 17:39   Dg Chest Port 1 View  02/08/2015  CLINICAL DATA:  Atelectasis. EXAM: PORTABLE CHEST 1 VIEW COMPARISON:  02/07/2015. FINDINGS: Endotracheal tube, NG tube, right chest tube, mediastinal drainage tubes and left PICC line stable position. The tip of the right PICC line in slightly coiled in the region of the superior vena cava and unchanged. Prior CABG. Stable cardiomegaly. Interim near complete clearing of pulmonary interstitial prominence. Mild bibasilar atelectasis. Tiny left pleural effusion cannot be excluded . IMPRESSION: 1. Lines and tubes in stable position as above. 2. Prior CABG. Stable cardiomegaly. Interim near complete clearing of pulmonary  interstitial prominence and small left pleural effusion. Electronically Signed   By: Maisie Fus  Register   On: 02/08/2015 07:52   Dg Chest Port 1 View  02/07/2015  CLINICAL DATA:  Hypoxia and atelectatic change EXAM: PORTABLE CHEST 1 VIEW COMPARISON:  February 06, 2015 FINDINGS: Endotracheal tube tip is 5.5 cm above the carina. Swan-Ganz catheter tip is in the main pulmonary outflow tract directed toward the right. Left central catheter tip may be in the azygos vein. Chest tubes and mediastinal drains remain without change. Temporary pacemaker wires are attached to the right heart. No pneumothorax. There is increase in airspace consolidation in the left base and right mid lung regions. There is a small left effusion. Heart is upper normal in size with pulmonary vascularity within normal limits. No adenopathy. IMPRESSION: Tube and catheter positions as described. The left subclavian catheter tip may be in the azygos vein. It has withdrawn several cm compared to 1 day prior.  Increasing patchy airspace disease in the right mid lung and left base. Question congestive heart failure versus superimposed pneumonia. Both entities may exist concurrently. Electronically Signed   By: Bretta Bang III M.D.   On: 02/07/2015 07:28   Dg Chest Port 1 View  02/06/2015  CLINICAL DATA:  Shortness of Breath/hypoxia EXAM: PORTABLE CHEST 1 VIEW COMPARISON:  February 05, 2015 FINDINGS: Endotracheal tube tip is 4.7 cm above the carina. Swan-Ganz catheter tip is in the main pulmonary outflow tract directed toward the right. There are bilateral chest tubes and a mediastinal drain. Temporary pacemaker wires are attached to the right heart. There is a left subclavian catheter with the tip at the cavoatrial junction. No pneumothorax. There is mild atelectasis in the right mid lung and left base regions. There is no airspace consolidation. There is a minimal left effusion. Heart size is upper normal with pulmonary vascularity within normal limits. No adenopathy. IMPRESSION: Tube and catheter positions as described without pneumothorax. Mild atelectatic change bilaterally. Minimal left effusion. No airspace consolidation. No change in cardiac silhouette. Electronically Signed   By: Bretta Bang III M.D.   On: 02/06/2015 07:21   Dg Chest Port 1 View  02/05/2015  CLINICAL DATA:  Acute onset of shortness of breath. Initial encounter. EXAM: PORTABLE CHEST 1 VIEW COMPARISON:  Chest radiograph performed earlier today at 6:13 a.m. FINDINGS: The patient's endotracheal tube is seen ending 5 cm above the carina. An enteric tube is noted extending below the diaphragm. Bilateral chest tubes and mediastinal drains are seen. A right IJ Swan-Ganz catheter is noted ending at the main pulmonary outflow tract. A left PICC is noted ending about the distal SVC. The lungs are well-aerated. Mild bilateral atelectasis is noted. There is no evidence of pleural effusion or pneumothorax. The cardiomediastinal silhouette is  borderline enlarged. Postoperative change is noted along the midline chest wall, with changes of CABG. No acute osseous abnormalities are seen. IMPRESSION: 1. Endotracheal tube seen ending 5 cm above the carina. Tubes and lines as described above. 2. Borderline cardiomegaly. 3. Mild bilateral atelectasis noted. Electronically Signed   By: Roanna Raider M.D.   On: 02/05/2015 23:00   Dg Chest Port 1 View  02/05/2015  CLINICAL DATA:  Left ventricular assist device. EXAM: PORTABLE CHEST - 1 VIEW COMPARISON:  One-view chest 02/04/2015. FINDINGS: The heart is mildly enlarged. Left ventricular assist device remains in place. Additional drains are stable. The patient remains intubated. Bilateral chest tubes are in place. A Swan-Ganz catheter is in place. A left-sided PICC line  remains. Left greater than right pleural effusions are again noted. Left basilar airspace disease likely reflects atelectasis. Lung volumes are slightly decreased compared with the prior exam. IMPRESSION: 1. Left ventricular assist device is stable. 2. Trans tubes are stable. 3. Decreasing lung volumes. 4. Left greater than right pleural effusions and associated atelectasis. Electronically Signed   By: Marin Roberts M.D.   On: 02/05/2015 07:51   Dg Chest Port 1 View  02/04/2015  CLINICAL DATA:  Left ventricular assist device EXAM: PORTABLE CHEST 1 VIEW COMPARISON:  02/03/2015 FINDINGS: Endotracheal tube remains in good position and unchanged. Mediastinal drain in place. Bilateral chest tubes in place. No pneumothorax. Swan-Ganz catheter right lower pulmonary artery Left ventricular assist device unchanged in position Mild bibasilar atelectasis. Negative for heart failure or pneumonia. IMPRESSION: Support lines remain in good position. Mild bibasilar atelectasis unchanged.  No acute findings. Electronically Signed   By: Marlan Palau M.D.   On: 02/04/2015 07:24   Dg Chest Port 1 View  02/03/2015  CLINICAL DATA:  Status post LVAD.   Decreased oxygen saturation. EXAM: PORTABLE CHEST 1 VIEW COMPARISON:  Portable film earlier in the day. FINDINGS: Mild vascular congestion and slight subsegmental atelectasis without significant volume loss, consolidation, or edema. LVAD device unchanged. BILATERAL chest tubes and mediastinal tube stable. Endotracheal tube stable. Swan-Ganz catheter tip RIGHT lower lobe pulmonary artery. Nasogastric tube in the stomach. IMPRESSION: Stable chest.  No evidence for significant consolidation or edema. Electronically Signed   By: Elsie Stain M.D.   On: 02/03/2015 12:00   Dg Chest Port 1 View  02/03/2015  CLINICAL DATA:  Ventilator.  Postop. EXAM: PORTABLE CHEST 1 VIEW COMPARISON:  02/02/2015 FINDINGS: Slight advancement of the Swan-Ganz catheter with the tip in the inferior right pulmonary artery. Bilateral chest tubes without pneumothorax. Endotracheal tube and NG tube are unchanged. Cardiomegaly. Low lung volumes with left base atelectasis. Improving aeration since prior study with decreasing bibasilar atelectasis. IMPRESSION: Low volumes with left base atelectasis. Improving aeration in the bases since prior study Electronically Signed   By: Charlett Nose M.D.   On: 02/03/2015 07:36   Dg Chest Port 1 View  02/02/2015  CLINICAL DATA:  Post CABG EXAM: PORTABLE CHEST 1 VIEW COMPARISON:  Chest radiograph from one day prior. FINDINGS: Right rotated chest radiograph. Endotracheal tube tip is 3.0 cm above the carina. Right internal jugular Swan-Ganz catheter terminates over the main pulmonary artery. Enteric tube terminates in the proximal stomach. Bilateral chest tubes and pericardial drains are stable. Stable cardiomediastinal silhouette with mild to moderate cardiomegaly. No pneumothorax. Stable mild pulmonary edema. Small left pleural effusion, likely stable. Stable bibasilar atelectasis, most prominent in the left lower lobe. IMPRESSION: 1. Well-positioned lines and tubes.  No pneumothorax. 2. Stable mild  congestive heart failure. 3. Stable bibasilar atelectasis most prominent in the left lower lobe with likely small stable left pleural effusion. Electronically Signed   By: Delbert Phenix M.D.   On: 02/02/2015 07:49   Dg Chest Port 1 View  02/01/2015  CLINICAL DATA:  Status post CABG x 4. EXAM: PORTABLE CHEST 1 VIEW COMPARISON:  Earlier this day at 1651 hour FINDINGS: Endotracheal tube is 3.3 cm from the carina. Enteric tube in place, tip and side port below the diaphragm in the stomach. Tip of the right internal jugular Swan-Ganz catheter in the region of the distal pulmonary outflow tract/proximal right pulmonary artery. Bilateral chest tubes in place. Overlying tubing and support apparatus over the lower mediastinum. Multiple surgical clips. Lung  volumes are low. Heart is mildly enlarged. No definite pneumothorax. No pulmonary edema. Minimal blunting of both costophrenic angles. IMPRESSION: 1. Support apparatus as described. 2. Low lung volumes. Minimal blunting of both costophrenic angles, possibly small effusions. No definite pneumothorax with bilateral chest tubes in place. Electronically Signed   By: Rubye Oaks M.D.   On: 02/01/2015 19:18   Dg Chest Port 1 View  02/01/2015  CLINICAL DATA:  Incorrect needle count following CABG x 4. EXAM: PORTABLE CHEST 1 VIEW COMPARISON:  01/30/2015. FINDINGS: Interval post CABG changes with mediastinal and bilateral chest tubes. Surgical clips in the chest and upper abdomen. No radiopaque needle seen. Support tubing and devices overlying the chest. The endotracheal tube tip is obscured by an overlying device. The right jugular Swan-Ganz catheter tip is in the proximal right main pulmonary artery. Poor inspiration with grossly clear lungs. No pneumothorax seen. IMPRESSION: No acute abnormality and no visible needle. Electronically Signed   By: Beckie Salts M.D.   On: 02/01/2015 17:05   Dg Abd Portable 1v  02/22/2015  CLINICAL DATA:  Feeding tube placement. EXAM:  PORTABLE ABDOMEN - 1 VIEW COMPARISON:  02/18/2015 FINDINGS: No evidence of dilated bowel loops.  Bowel gas pattern is normal. Feeding tube is seen with tip in the descending portion the duodenum. Nasogastric tube is seen with tip in the gastric antrum. Temperature probe noted in the urinary bladder. IMPRESSION: Normal bowel gas pattern. Feeding tube tip in descending duodenum and nasogastric tube tip in gastric antrum. Electronically Signed   By: Myles Rosenthal M.D.   On: 02/22/2015 07:30   Dg Abd Portable 1v  02/18/2015  CLINICAL DATA:  Feeding tube placement. EXAM: PORTABLE ABDOMEN - 1 VIEW this COMPARISON:  Abdominal radiograph 02/17/2015 FINDINGS: Enteric tube tip and side-port project over the stomach. Additionally there is a feeding tube with the tip projecting the expected location of the fourth portion of the duodenum. Unremarkable bowel gas pattern. Heterogeneous opacities bilateral lung bases. Lumbar spine degenerative changes. Surgical staple line overlying chest. IMPRESSION: NG tube tip and side-port project over the stomach. Feeding tube tip stable in position projecting at the fourth portion of the duodenum. Electronically Signed   By: Annia Belt M.D.   On: 02/18/2015 10:36   Dg Abd Portable 1v  02/17/2015  CLINICAL DATA:  Feeding tube placement EXAM: PORTABLE ABDOMEN - 1 VIEW COMPARISON:  Study obtained earlier in the day FINDINGS: Feeding tube tip is at or just beyond the ligament of Treitz. Bowel gas pattern unremarkable. Pacemaker lead wires are attached to the right heart. IMPRESSION: Feeding tube tip at or just beyond the ligament of Treitz. Bowel gas pattern unremarkable. Electronically Signed   By: Bretta Bang III M.D.   On: 02/17/2015 21:34   Dg Abd Portable 1v  02/17/2015  CLINICAL DATA:  Encounter for feeding tube placement. EXAM: PORTABLE ABDOMEN - 1 VIEW COMPARISON:  February 10, 2015. FINDINGS: The bowel gas pattern is normal. Distal tip of feeding tube remains in the  expected position of jejunum. IMPRESSION: No evidence of bowel obstruction or ileus. Distal tip of feeding tube remains in the expected position of jejunum. Electronically Signed   By: Lupita Raider, M.D.   On: 02/17/2015 09:40   Dg Abd Portable 1v  02/10/2015  CLINICAL DATA:  Feeding tube placement. EXAM: PORTABLE ABDOMEN - 1 VIEW COMPARISON:  None. FINDINGS: Feeding tube has been advanced. Its tip is in the region of the distal stomach or proximal duodenum. NG tube  noted in stable position with tip in the stomach. No bowel distention. Central line and sheath noted over the right heart. Prior CABG. IMPRESSION: Interim advancement of feeding tube, its tip is now over the distal stomach/proximal duodenum. NG tube in stable position with tip in the stomach. No bowel distention. Electronically Signed   By: Maisie Fus  Register   On: 02/10/2015 13:46   Dg Abd Portable 1v  02/10/2015  CLINICAL DATA:  Encounter for feeding tube placement. EXAM: PORTABLE ABDOMEN - 1 VIEW COMPARISON:  Chest radiograph 02/10/2015 FINDINGS: There are epicardial pacer wires. Nasogastric tube in the region the stomach body. There is a feeding tube which extends into the region of the gastric antrum. Nonobstructive bowel gas pattern. Limited evaluation of the lung bases. IMPRESSION: Feeding tube tip in the region of the distal stomach. Nasogastric tube tip in the gastric body region. Electronically Signed   By: Richarda Overlie M.D.   On: 02/10/2015 10:17   Dg Vangie Bicker G Tube Plc W/fl-no Rad  02/10/2015  CLINICAL DATA:  NASO G TUBE PLACEMENT WITH FLUORO Fluoroscopy was utilized by the requesting physician.  No radiographic interpretation.    Labs:  CBC:  Recent Labs  02/20/15 0600 02/21/15 0426 02/22/15 0340 02/23/15 0451  WBC 15.3* 11.4* 10.2 11.3*  HGB 8.6* 8.1* 7.9* 8.1*  HCT 28.1* 26.3* 24.8* 25.2*  PLT 419* 357 377 369    COAGS:  Recent Labs  02/05/15 0400 02/05/15 1110 02/05/15 1606 02/05/15 2200 02/12/15 1010  02/13/15 0430  INR 1.60*  --   --  1.51* 1.56* 1.44  APTT 71* 68* 72* 48*  --   --     BMP:  Recent Labs  02/21/15 1540 02/21/15 2145 02/22/15 0340 02/23/15 0451  NA 137 138 142 144  K 2.7* 3.2* 4.1 3.3*  CL 101 102 106 108  CO2 GLUCOSE 121* 165* 133* 112*  BUN 81* 78* 77* 67*  CALCIUM 8.2* 8.3* 8.5* 8.7*  CREATININE 1.75* 1.68* 1.64* 1.64*  GFRNONAA 30* 32* 33* 33*  GFRAA 35* 37* 38* 38*    LIVER FUNCTION TESTS:  Recent Labs  02/20/15 0600 02/21/15 0426 02/22/15 0340 02/23/15 0451  BILITOT 2.2* 3.3* 3.9* 3.8*  AST 64* 80* 67* 67*  ALT 134* 126* 129* 126*  ALKPHOS 154* 151* 132* 135*  PROT 6.9 6.4* 6.6 6.9  ALBUMIN 2.3* 2.0* 2.0* 2.0*    TUMOR MARKERS: No results for input(s): AFPTM, CEA, CA199, CHROMGRNA in the last 8760 hours.  Assessment and Plan:  Malnutrition Aspiration Swallow dysfunction On vent/trach No response Plan for perc g tube  Need consent from family Poss G tube 10/19--- Will check temp in am and labs  Thank you for this interesting consult.  I greatly enjoyed meeting GHISLAINE HARCUM and look forward to participating in their care.  A copy of this report was sent to the requesting provider on this date.  Signed: Sani Loiseau A 02/23/2015, 1:56 PM   I spent a total of 40 Minutes    in face to face in clinical consultation, greater than 50% of which was counseling/coordinating care for perc G tube

## 2015-02-23 NOTE — Progress Notes (Signed)
Midnight CBG 70.  Pt given 25ml Dextrose 50% per protocol   Repeat CBG 121.  Will continue to monitor.

## 2015-02-23 NOTE — Progress Notes (Signed)
15 Days Post-Op Procedure(s) (LRB): STERNAL CLOSURE (N/A) TRANSESOPHAGEAL ECHOCARDIOGRAM (TEE) (N/A) Subjective: 8 days status post tracheostomy for aspiration pneumonia, ventilator dependent respiratory failure  The patient developed temperature 101.5 last night. Repeat tracheal aspirate cultures submitted Add vancomycin and IV Diflucan empirically pending culture results  Chest x-ray today shows increased edema, CVP has increased Will resume Lasix-Zaroxolyn TNA transition to Panda tube feeds. Because of severe swallow dysfunction patient will need PEG tube and IR has been consulted Sternal incision, LVAD cannulation incisions, all healing  Objective: Vital signs in last 24 hours: Temp:  [98.7 F (37.1 C)-101.2 F (38.4 C)] 99 F (37.2 C) (10/18 0800) Pulse Rate:  [95-138] 122 (10/18 0800) Cardiac Rhythm:  [-] Sinus tachycardia (10/18 0700) Resp:  [13-35] 19 (10/18 0800) BP: (87-160)/(38-82) 139/60 mmHg (10/18 0800) SpO2:  [96 %-100 %] 100 % (10/18 0800) Arterial Line BP: (86-177)/(34-79) 137/62 mmHg (10/18 0800) FiO2 (%):  [40 %] 40 % (10/18 0700) Weight:  [111 lb 1.8 oz (50.4 kg)] 111 lb 1.8 oz (50.4 kg) (10/18 0600)  Hemodynamic parameters for last 24 hours: CVP:  [8 mmHg-15 mmHg] 14 mmHg  Intake/Output from previous day: 10/17 0701 - 10/18 0700 In: 3151.6 [I.V.:1321.6; NG/GT:1080; IV Piggyback:200; TPN:550] Out: 3135 [Urine:2460; Emesis/NG output:650; Stool:25] Intake/Output this shift: Total I/O In: 88.4 [I.V.:58.4; NG/GT:30] Out: 60 [Urine:60]  Alert responsive on ventilator Coarse breath sounds Abdomen soft nontender Extremities warm without edema  Lab Results:  Recent Labs  02/22/15 0340 02/23/15 0451  WBC 10.2 11.3*  HGB 7.9* 8.1*  HCT 24.8* 25.2*  PLT 377 369   BMET:  Recent Labs  02/22/15 0340 02/23/15 0451  NA 142 144  K 4.1 3.3*  CL 106 108  CO2 26 25  GLUCOSE 133* 112*  BUN 77* 67*  CREATININE 1.64* 1.64*  CALCIUM 8.5* 8.7*     PT/INR: No results for input(s): LABPROT, INR in the last 72 hours. ABG    Component Value Date/Time   PHART 7.390 02/23/2015 0420   HCO3 22.5 02/23/2015 0420   TCO2 23.6 02/23/2015 0420   ACIDBASEDEF 1.6 02/23/2015 0420   O2SAT 76.6 02/23/2015 0529   CBG (last 3)   Recent Labs  02/23/15 0051 02/23/15 0358 02/23/15 0806  GLUCAP 121* 109* 135*    Assessment/Plan: S/P Procedure(s) (LRB): STERNAL CLOSURE (N/A) TRANSESOPHAGEAL ECHOCARDIOGRAM (TEE) (N/A) Continue pressure support vent weaning with tracheostomy Transfusion one unit of packed cells for postoperative anemia Place PEG tube per IR for severe swallow dysfunction-aspiration Follow fever, broad antibiotic coverage, vancomycin per pharmacy Resume Lasix as recommended by heart failure team, continue milrinone   LOS: 28 days    Kathlee Nationseter Van Trigt III 02/23/2015

## 2015-02-23 NOTE — Progress Notes (Signed)
[facility] BRIEF SICU PROGRESS NOTE  15 Days Post-Op  S/P Procedure(s) (LRB): STERNAL CLOSURE (N/A) TRANSESOPHAGEAL ECHOCARDIOGRAM (TEE) (N/A)   Sinus rhythm BP stable on low dose milrinone Copious frothy airway secretions Diuresing reasonably well  Plan: Continue current plan  Purcell Nailslarence H Semisi Biela, MD 02/23/2015 5:35 PM

## 2015-02-24 ENCOUNTER — Inpatient Hospital Stay (HOSPITAL_COMMUNITY): Payer: Medicaid Other

## 2015-02-24 LAB — POCT I-STAT, CHEM 8
BUN: 47 mg/dL — ABNORMAL HIGH (ref 6–20)
Calcium, Ion: 1.15 mmol/L (ref 1.13–1.30)
Chloride: 106 mmol/L (ref 101–111)
Creatinine, Ser: 1.6 mg/dL — ABNORMAL HIGH (ref 0.44–1.00)
Glucose, Bld: 97 mg/dL (ref 65–99)
HCT: 33 % — ABNORMAL LOW (ref 36.0–46.0)
Hemoglobin: 11.2 g/dL — ABNORMAL LOW (ref 12.0–15.0)
Potassium: 2.8 mmol/L — ABNORMAL LOW (ref 3.5–5.1)
Sodium: 146 mmol/L — ABNORMAL HIGH (ref 135–145)
TCO2: 25 mmol/L (ref 0–100)

## 2015-02-24 LAB — TYPE AND SCREEN
ABO/RH(D): O POS
Antibody Screen: NEGATIVE
Unit division: 0

## 2015-02-24 LAB — COMPREHENSIVE METABOLIC PANEL
ALT: 88 U/L — ABNORMAL HIGH (ref 14–54)
AST: 33 U/L (ref 15–41)
Albumin: 1.9 g/dL — ABNORMAL LOW (ref 3.5–5.0)
Alkaline Phosphatase: 121 U/L (ref 38–126)
Anion gap: 13 (ref 5–15)
BUN: 58 mg/dL — ABNORMAL HIGH (ref 6–20)
CO2: 27 mmol/L (ref 22–32)
Calcium: 8.7 mg/dL — ABNORMAL LOW (ref 8.9–10.3)
Chloride: 105 mmol/L (ref 101–111)
Creatinine, Ser: 1.8 mg/dL — ABNORMAL HIGH (ref 0.44–1.00)
GFR calc Af Amer: 34 mL/min — ABNORMAL LOW (ref >60)
GFR calc non Af Amer: 29 mL/min — ABNORMAL LOW (ref >60)
Glucose, Bld: 122 mg/dL — ABNORMAL HIGH (ref 65–99)
Potassium: 3.1 mmol/L — ABNORMAL LOW (ref 3.5–5.1)
Sodium: 145 mmol/L (ref 135–145)
Total Bilirubin: 3.1 mg/dL — ABNORMAL HIGH (ref 0.3–1.2)
Total Protein: 6.3 g/dL — ABNORMAL LOW (ref 6.5–8.1)

## 2015-02-24 LAB — GLUCOSE, CAPILLARY
GLUCOSE-CAPILLARY: 111 mg/dL — AB (ref 65–99)
Glucose-Capillary: 101 mg/dL — ABNORMAL HIGH (ref 65–99)
Glucose-Capillary: 133 mg/dL — ABNORMAL HIGH (ref 65–99)
Glucose-Capillary: 80 mg/dL (ref 65–99)
Glucose-Capillary: 84 mg/dL (ref 65–99)
Glucose-Capillary: 88 mg/dL (ref 65–99)

## 2015-02-24 LAB — CBC
HCT: 27.2 % — ABNORMAL LOW (ref 36.0–46.0)
Hemoglobin: 8.7 g/dL — ABNORMAL LOW (ref 12.0–15.0)
MCH: 27.8 pg (ref 26.0–34.0)
MCHC: 32 g/dL (ref 30.0–36.0)
MCV: 86.9 fL (ref 78.0–100.0)
Platelets: 314 10*3/uL (ref 150–400)
RBC: 3.13 MIL/uL — ABNORMAL LOW (ref 3.87–5.11)
RDW: 17.4 % — ABNORMAL HIGH (ref 11.5–15.5)
WBC: 9.1 10*3/uL (ref 4.0–10.5)

## 2015-02-24 LAB — POCT I-STAT 3, ART BLOOD GAS (G3+)
ACID-BASE EXCESS: 2 mmol/L (ref 0.0–2.0)
Bicarbonate: 26.7 mEq/L — ABNORMAL HIGH (ref 20.0–24.0)
O2 SAT: 98 %
PH ART: 7.392 (ref 7.350–7.450)
TCO2: 28 mmol/L (ref 0–100)
pCO2 arterial: 44.4 mmHg (ref 35.0–45.0)
pO2, Arterial: 107 mmHg — ABNORMAL HIGH (ref 80.0–100.0)

## 2015-02-24 LAB — PROTIME-INR
INR: 1.48 (ref 0.00–1.49)
Prothrombin Time: 18 seconds — ABNORMAL HIGH (ref 11.6–15.2)

## 2015-02-24 LAB — POCT I-STAT 4, (NA,K, GLUC, HGB,HCT)
GLUCOSE: 108 mg/dL — AB (ref 65–99)
HEMATOCRIT: 27 % — AB (ref 36.0–46.0)
Hemoglobin: 9.2 g/dL — ABNORMAL LOW (ref 12.0–15.0)
Potassium: 3.4 mmol/L — ABNORMAL LOW (ref 3.5–5.1)
SODIUM: 145 mmol/L (ref 135–145)

## 2015-02-24 LAB — CARBOXYHEMOGLOBIN
Carboxyhemoglobin: 1.8 % — ABNORMAL HIGH (ref 0.5–1.5)
Methemoglobin: 0.8 % (ref 0.0–1.5)
O2 Saturation: 82.3 %
Total hemoglobin: 7.9 g/dL — ABNORMAL LOW (ref 12.0–16.0)

## 2015-02-24 LAB — APTT: aPTT: 37 seconds (ref 24–37)

## 2015-02-24 MED ORDER — POTASSIUM CHLORIDE 10 MEQ/50ML IV SOLN
10.0000 meq | INTRAVENOUS | Status: AC
  Administered 2015-02-24 (×2): 10 meq via INTRAVENOUS
  Filled 2015-02-24: qty 50

## 2015-02-24 MED ORDER — DEXTROSE-NACL 5-0.45 % IV SOLN
INTRAVENOUS | Status: DC
  Administered 2015-02-24 – 2015-02-26 (×3): via INTRAVENOUS
  Administered 2015-02-28 – 2015-03-01 (×2): 1000 mL via INTRAVENOUS
  Administered 2015-03-03: 1 mL via INTRAVENOUS

## 2015-02-24 MED ORDER — POTASSIUM CHLORIDE 10 MEQ/50ML IV SOLN
10.0000 meq | INTRAVENOUS | Status: AC
  Administered 2015-02-24 (×3): 10 meq via INTRAVENOUS

## 2015-02-24 MED ORDER — POTASSIUM CHLORIDE 10 MEQ/50ML IV SOLN
10.0000 meq | INTRAVENOUS | Status: AC
  Administered 2015-02-24 – 2015-02-25 (×3): 10 meq via INTRAVENOUS

## 2015-02-24 MED ORDER — POTASSIUM CHLORIDE 10 MEQ/50ML IV SOLN
INTRAVENOUS | Status: AC
  Filled 2015-02-24: qty 150

## 2015-02-24 MED ORDER — MILRINONE IN DEXTROSE 20 MG/100ML IV SOLN
0.1250 ug/kg/min | INTRAVENOUS | Status: DC
  Administered 2015-02-24: 0.125 ug/kg/min via INTRAVENOUS
  Filled 2015-02-24: qty 100

## 2015-02-24 MED ORDER — ENOXAPARIN SODIUM 30 MG/0.3ML ~~LOC~~ SOLN
30.0000 mg | SUBCUTANEOUS | Status: DC
  Administered 2015-02-26 – 2015-02-28 (×3): 30 mg via SUBCUTANEOUS
  Filled 2015-02-24 (×4): qty 0.3

## 2015-02-24 NOTE — Evaluation (Signed)
Physical Therapy Evaluation Patient Details Name: Breanna Alvarez MRN: [id number] DOB: 1951/03/20 Today's Date: 02/24/2015   History of Present Illness  Breanna Alvarez is a 64 y/o female who has PMHx of CAD, CHF and had an NSTEMI in ealy 2016, present to ED with ongoing CP.  She is now s/p CABG as of 9/27 with Centrimag placement, 9/30 Centrimag removal, 10/3 sternal closure, Wound I and D 10/4, failed extubation 10/7, 10/10 trached, ?aspiration on 10/13 and worsening status.     First attempt wean to [facility] 10/19.  Clinical Impression  Pt admitted with/for CABG, but has been immobile for approx a month due to post op complications..  Pt currently limited functionally due to the problems listed. ( See problems list.)   Pt will benefit from PT to maximize function and safety in order to get ready for next venue listed below.     Follow Up Recommendations SNF (LTACH if can come through)    Equipment Recommendations  Other (comment) (TBA later)    Recommendations for Other Services       Precautions / Restrictions        Mobility  Bed Mobility Overal bed mobility: Needs Assistance;+2 for physical assistance Bed Mobility: Supine to Sit     Supine to sit: Total assist;+2 for physical assistance     General bed mobility comments: No discernible truncal  or extremity assist  Transfers Overall transfer level: Needs assistance   Transfers: Sit to/from Stand;Stand Pivot Transfers Sit to Stand: Total assist;+2 safety/equipment Stand pivot transfers: Total assist;+2 physical assistance       General transfer comment: No assist noted.  Ambulation/Gait             General Gait Details: unable  Stairs            Wheelchair Mobility    Modified Rankin (Stroke Patients Only)       Balance Overall balance assessment: Needs assistance Sitting-balance support: Feet supported;No upper extremity supported Sitting balance-Leahy Scale: Zero Sitting balance - Comments:  one spontaneous activation of abdominals otherwise no truncal responses                                     Pertinent Vitals/Pain Pain Assessment: Faces Pain Score: 4  Faces Pain Scale: No hurt    Home Living Family/patient expects to be discharged to:: Private residence Living Arrangements: Spouse/significant other               Additional Comments: No family available to get home or PLOF info.    Prior Function                 Hand Dominance        Extremity/Trunk Assessment   Upper Extremity Assessment: Defer to OT evaluation           Lower Extremity Assessment: Generalized weakness;RLE deficits/detail;LLE deficits/detail RLE Deficits / Details: restless, spontaneous movement grossly 2/5 LLE Deficits / Details: restless movements grossly 2/5     Communication   Communication: Tracheostomy  Cognition Arousal/Alertness: Lethargic Behavior During Therapy: Restless;Flat affect Overall Cognitive Status: Impaired/Different from baseline                      General Comments General comments (skin integrity, edema, etc.): VSS, SpO2 on TC 92-95%, HR 100's-120's    Exercises        Assessment/Plan  PT Assessment Patient needs continued PT services  PT Diagnosis Generalized weakness   PT Problem List Decreased strength;Decreased activity tolerance;Decreased balance;Decreased mobility;Decreased coordination;Decreased knowledge of use of DME;Decreased safety awareness;Decreased knowledge of precautions;Cardiopulmonary status limiting activity  PT Treatment Interventions Functional mobility training;Therapeutic activities;Therapeutic exercise;Balance training;Patient/family education   PT Goals (Current goals can be found in the Care Plan section) Acute Rehab PT Goals Patient Stated Goal: pt unable to participate, no family present PT Goal Formulation: Patient unable to participate in goal setting Time For Goal Achievement:  03/10/15 Potential to Achieve Goals: Fair    Frequency Min 3X/week   Barriers to discharge        Co-evaluation               End of Session   Activity Tolerance: Patient limited by fatigue;Patient limited by lethargy Patient left: in chair;with call bell/phone within reach;Other (comment);with SCD's reapplied (on sky lift pad.) Nurse Communication: Need for lift equipment;Mobility status         Time: 1124-1202 PT Time Calculation (min) (ACUTE ONLY): 38 min   Charges:   PT Evaluation $Initial PT Evaluation Tier I: 1 Procedure PT Treatments $Therapeutic Activity: 23-37 mins   PT G Codes:        Rebeca Valdivia, Eliseo GumKenneth V 02/24/2015, 2:35 PM 02/24/2015  [place] BingKen Ania Levay, PT [phone]  (pager)

## 2015-02-24 NOTE — Progress Notes (Signed)
PULMONARY / CRITICAL CARE MEDICINE   Name: Breanna Alvarez MRN: [id number] DOB: 1950-07-27    ADMISSION DATE:  01/26/2015 CONSULTATION DATE:  02/09/2105  REFERRING MD :  Donata Clay   CHIEF COMPLAINT:  Vent management   INITIAL PRESENTATION:  64 yo female admitted with chest pain from CAD.  She had CABG 02/01/15 >> post-op complicated by cardiogenic shock s/p centrimag placement and removal, ischemic cardiomyopathy with EF 20%, open sternum.  She remained on vent post-op and PCCM consulted to assist with vent weaning.  STUDIES:  9/21 Echo >> EF 20-25%, mod MR, PA press 51 mmHg 10/15 Echo >> EF 25 to 30%, PAS 36 mmHg  SIGNIFICANT EVENTS: 09/26 CABG x 4, placement of centrimag L ventricular assistive device  09/30 removal centrimag  10/03 sternal closure 10/09 Hg drop overnight by 1 gm and transfusion ordered 10/10 Trach by DF 10/12 Off precedex 10/13 possible aspiration event; TNA started 10/14 difficulty with agitation 10/16 off pressors 10/17 off TNA 10/18 Fever 101, transfuse 1 unit PRBC  SUBJECTIVE: No additional fever over last 24 hrs.  For G tube placement with IR today.  VITAL SIGNS: Temp:  [98 F (36.7 C)-101.1 F (38.4 C)] 99.2 F (37.3 C) (10/19 0810) Pulse Rate:  [58-131] 108 (10/19 0800) Resp:  [12-32] 17 (10/19 0800) BP: (85-163)/(39-100) 120/52 mmHg (10/19 0800) SpO2:  [94 %-100 %] 100 % (10/19 0800) Arterial Line BP: (77-166)/(40-81) 117/52 mmHg (10/19 0800) FiO2 (%):  [40 %] 40 % (10/19 0829) Weight:  [106 lb 14.8 oz (48.5 kg)] 106 lb 14.8 oz (48.5 kg) (10/19 0400)   HEMODYNAMICS: CVP:  [13 mmHg-20 mmHg] 13 mmHg   VENTILATOR SETTINGS: Vent Mode:  [-] PRVC FiO2 (%):  [40 %] 40 % Set Rate:  [14 bmp] 14 bmp Vt Set:  [370 mL] 370 mL PEEP:  [5 cmH20] 5 cmH20 Pressure Support:  [15 cmH20] 15 cmH20 Plateau Pressure:  [18 cmH20-25 cmH20] 25 cmH20  INTAKE / OUTPUT:  Intake/Output Summary (Last 24 hours) at 02/24/15 0855 Last data filed at 02/24/15  0800  Gross per 24 hour  Intake 2792.6 ml  Output   4775 ml  Net -1982.4 ml   PHYSICAL EXAMINATION: General: pleasant Neuro: RASS 0, moves all extremities HEENT: trach site clean Cardiovascular: regular, tachycardic, sternal wound clean Lungs: faint crackles, no wheeze Abdomen: soft, non tender Musculoskeletal: no edema  LABS: PULMONARY  Recent Labs Lab 02/20/15 0550 02/21/15 0430  02/22/15 0343 02/23/15 0420 02/23/15 0529 02/23/15 1456 02/24/15 0320 02/24/15 0339  PHART 7.275* 7.368  --  7.365 7.390  --   --  7.392  --   PCO2ART 50.2* 41.4  --  43.6 38.3  --   --  44.4  --   PO2ART 110* 131*  --  116* 141*  --   --  107.0*  --   HCO3 22.3 23.2  --  24.3* 22.5  --   --  26.7*  --   TCO2 23.8 24.5  --  25.6 23.6  --  24 28  --   O2SAT 97.8 71.5  98.7  < > 98.6 98.9 76.6  --  98.0 82.3  < > = values in this interval not displayed.  CBC  Recent Labs Lab 02/22/15 0340 02/23/15 0451 02/23/15 1456 02/24/15 0315  HGB 7.9* 8.1* 10.9* 8.7*  HCT 24.8* 25.2* 32.0* 27.2*  WBC 10.2 11.3*  --  9.1  PLT 377 369  --  314   CHEMISTRY  Recent Labs Lab  02/18/15 0450  02/19/15 0525  02/20/15 0600 02/21/15 0426  02/21/15 1540 02/21/15 2145 02/22/15 0340 02/23/15 0451 02/23/15 1456 02/24/15 0315  NA 147*  < > 144  < > 142 140  < > 137 138 142 144 146* 145  K 3.3*  < > 3.9  < > 3.5 2.4*  < > 2.7* 3.2* 4.1 3.3* 3.7 3.1*  CL 111  < > 111  < > 104 101  < > 101 102 106 108 110 105  CO2 20*  --  22  --  25 25  < > --  27  GLUCOSE 129*  < > 122*  < > 166* 153*  < > 121* 165* 133* 112* 96 122*  BUN 56*  < > 67*  < > 74* 81*  < > 81* 78* 77* 67* 56* 58*  CREATININE 1.28*  < > 1.54*  < > 1.70* 1.72*  < > 1.75* 1.68* 1.64* 1.64* 1.70* 1.80*  CALCIUM 8.6*  --  8.7*  --  8.4* 8.0*  < > 8.2* 8.3* 8.5* 8.7*  --  8.7*  MG 1.9  --  2.4  --  2.2 1.7  --   --   --  2.3  --   --   --   PHOS 3.9  --  4.5  --  6.6* 4.6  --   --   --  3.7  --   --   --   < > = values in this  interval not displayed. Estimated Creatinine Clearance: 23.3 mL/min (by C-G formula based on Cr of 1.8).  LIVER  Recent Labs Lab 02/20/15 0600 02/21/15 0426 02/22/15 0340 02/23/15 0451 02/24/15 0315  AST 64* 80* 67* 67* 33  ALT 134* 126* 129* 126* 88*  ALKPHOS 154* 151* 132* 135* 121  BILITOT 2.2* 3.3* 3.9* 3.8* 3.1*  PROT 6.9 6.4* 6.6 6.9 6.3*  ALBUMIN 2.3* 2.0* 2.0* 2.0* 1.9*  INR  --   --   --   --  1.48   ENDOCRINE CBG (last 3)   Recent Labs  02/23/15 2346 02/24/15 0350 02/24/15 0803  GLUCAP 101* 111* 88    Iron/TIBC/Ferritin/ %Sat    Component Value Date/Time   IRON 19* 02/22/2015 0900   TIBC 143* 02/22/2015 0900   FERRITIN 1025* 02/22/2015 0900   IRONPCTSAT 13 02/22/2015 0900    IMAGING x48h Dg Chest Port 1 View  02/24/2015  CLINICAL DATA:  Followup respiratory failure EXAM: PORTABLE CHEST 1 VIEW COMPARISON:  Yesterday FINDINGS: Tracheostomy and enteric tubes are in unchanged position where visualized. Left upper extremity PICC, tip at the lower SVC. Diffuse airspace disease with mild decrease in density. The pattern is stable from previous and remains nonspecific. Stable cardiomegaly post CABG. No effusion or air leak. IMPRESSION: 1. Mild improvement in diffuse airspace disease. 2. Unchanged positioning of tubes and central line. Electronically Signed   By: Marnee Spring M.D.   On: 02/24/2015 08:10   Dg Chest Port 1 View  02/23/2015  CLINICAL DATA:  CABG.  Shortness of breath. EXAM: PORTABLE CHEST 1 VIEW COMPARISON:  02/22/2015 FINDINGS: Changes of CABG. Tracheostomy tube is unchanged. There is cardiomegaly with diffuse bilateral airspace disease, not significantly changed. No visible effusions or pneumothorax. IMPRESSION: Stable diffuse bilateral airspace disease. Electronically Signed   By: Charlett Nose M.D.   On: 02/23/2015 08:14    ASSESSMENT / PLAN:  PULMONARY ETT 9/26 >> 10/07 Trach (DF) 10/10 >>  A: Acute on chronic respiratory failure 2nd to  acute pulmonary edema/cardiogenic shock after CABG, deconditioning, and aspiration pneumonia. Failure to wean from vent s/p tracheostomy. P:   Pressure support wean to trach collar as tolerated F/u CXR Trach care, bronchial hygiene D/c trach sutures 10/20 Prn albuterol  CARDIOVASCULAR Lt PICC 10/03 >>  Lt brachial aline 10/13 >> A: CAD s/p CABG.  Acute on chronic systolic heart failure with ischemic CM. Cardiogenic shock. HLD >> reported muscle aches with Crestor. P:  Milrinone per cardiology >> likely wean off soon Continue ASA, lopressor, NTG patch Lasix, zaroxolyn per TCTS and cardiology  RENAL A: AKI in setting of cardiogenic shock >> baseline creatinine 0.93 from 10/01/14 Hypokalemia. P:   Monitor renal fx, urine outpt Replace electrolytes as needed  GASTROINTESTINAL A: Protein calorie malnutrition. Elevated LFT's >> trending down P:   Tube feeds F/u LFTs intermittently Protonix for SUP For G tube placement by IR 10/19  HEMATOLOGIC A: Anemia of critical illness. P:  F/u CBC Lovenox for DVT prevention  INFECTIOUS A: Aspiration pneumonia 10/13. P:   Day 8 of zosyn F/u procalcitonin to assist with Abx duration  Blood 10/18 >> Sputum 10/18 >>  ENDOCRINE A: Hyperglycemia  P:   SSI  NEUROLOGIC A: Agitated delirium. Deconditioning. P:   Continue seroquel, klonopin Fentanyl gtt PRN versed RASS goal 0 Mobilize as able  DISPOSITION: Likely will eventually need LTAC to assist with rehab and vent weaning.   Coralyn HellingVineet Dereon Corkery, MD [facility] 02/24/2015, 8:55 AM Pager:  [phone] After 3pm call: [phone]

## 2015-02-24 NOTE — Progress Notes (Signed)
Advanced Heart Failure Rounding Note   Subjective:    S/P CABG with Centrimag placement 02/01/15  Centrimag explanted 9/30.   Sternal Closure 10/3.    10/7 Extubated, reintubated 2 hours later.   Awake on vent (trach). Continues on milrinone 0.25 mcg. Todays CO-OX is 82%. CVP 14-15. Continues with fevers.  On broad spectrum abx and anti-fungal coverage. Blood cultures pending.   ECHO 10/15 EF 25-30%  RV not visualized. Peak PA pressure 36 mm hg.    Objective:   Weight Range:  Vital Signs:   Temp:  [98 F (36.7 C)-101.1 F (38.4 C)] 99.2 F (37.3 C) (10/19 0810) Pulse Rate:  [97-129] 106 (10/19 0928) Resp:  [12-32] 20 (10/19 0928) BP: (85-163)/(39-100) 109/51 mmHg (10/19 0928) SpO2:  [94 %-100 %] 100 % (10/19 0928) Arterial Line BP: (77-166)/(40-81) 117/52 mmHg (10/19 0800) FiO2 (%):  [40 %] 40 % (10/19 0928) Weight:  [106 lb 14.8 oz (48.5 kg)] 106 lb 14.8 oz (48.5 kg) (10/19 0400) Last BM Date: 02/23/15  Weight change: Filed Weights   02/22/15 0400 02/23/15 0600 02/24/15 0400  Weight: 106 lb 4.2 oz (48.2 kg) 111 lb 1.8 oz (50.4 kg) 106 lb 14.8 oz (48.5 kg)    Intake/Output:   Intake/Output Summary (Last 24 hours) at 02/24/15 0955 Last data filed at 02/24/15 0800  Gross per 24 hour  Intake 2594.2 ml  Output   4675 ml  Net -2080.8 ml     Physical Exam:  CVP 14 General:  On trach on vent. Awake. HEENT: normal x ETT. Neck: supple. + trach Cor: Chest with dressing. Tachy regular Lungs: CTA Abdomen: soft, nontender, nondistended. Hypoactive bowel sounds. Extremities: no cyanosis, clubbing, rash,  No edema in thighs.   Neuro: awake on vent    Telemetry: Sinus tach ~100  Labs: Basic Metabolic Panel:  Recent Labs Lab 02/18/15 0450  02/19/15 0525  02/20/15 0600 02/21/15 0426  02/21/15 1540 02/21/15 2145 02/22/15 0340 02/23/15 0451 02/23/15 1456 02/24/15 0315  NA 147*  < > 144  < > 142 140  < > 137 138 142 144 146* 145  K 3.3*  < > 3.9  < >  3.5 2.4*  < > 2.7* 3.2* 4.1 3.3* 3.7 3.1*  CL 111  < > 111  < > 104 101  < > 101 102 106 108 110 105  CO2 20*  --  22  --  25 25  < > --  27  GLUCOSE 129*  < > 122*  < > 166* 153*  < > 121* 165* 133* 112* 96 122*  BUN 56*  < > 67*  < > 74* 81*  < > 81* 78* 77* 67* 56* 58*  CREATININE 1.28*  < > 1.54*  < > 1.70* 1.72*  < > 1.75* 1.68* 1.64* 1.64* 1.70* 1.80*  CALCIUM 8.6*  --  8.7*  --  8.4* 8.0*  < > 8.2* 8.3* 8.5* 8.7*  --  8.7*  MG 1.9  --  2.4  --  2.2 1.7  --   --   --  2.3  --   --   --   PHOS 3.9  --  4.5  --  6.6* 4.6  --   --   --  3.7  --   --   --   < > = values in this interval not displayed.  Liver Function Tests:  Recent Labs Lab 02/20/15 0600 02/21/15 0426  02/22/15 0340 02/23/15 0451 02/24/15 0315  AST 64* 80* 67* 67* 33  ALT 134* 126* 129* 126* 88*  ALKPHOS 154* 151* 132* 135* 121  BILITOT 2.2* 3.3* 3.9* 3.8* 3.1*  PROT 6.9 6.4* 6.6 6.9 6.3*  ALBUMIN 2.3* 2.0* 2.0* 2.0* 1.9*   No results for input(s): LIPASE, AMYLASE in the last 168 hours. No results for input(s): AMMONIA in the last 168 hours.  CBC:  Recent Labs Lab 02/20/15 0600 02/21/15 0426 02/22/15 0340 02/23/15 0451 02/23/15 1456 02/24/15 0315  WBC 15.3* 11.4* 10.2 11.3*  --  9.1  NEUTROABS  --   --  7.6  --   --   --   HGB 8.6* 8.1* 7.9* 8.1* 10.9* 8.7*  HCT 28.1* 26.3* 24.8* 25.2* 32.0* 27.2*  MCV 91.2 88.9 88.3 88.1  --  86.9  PLT 419* 357 377 369  --  314    Cardiac Enzymes: No results for input(s): CKTOTAL, CKMB, CKMBINDEX, TROPONINI in the last 168 hours.  BNP: BNP (last 3 results)  Recent Labs  07/10/14 1315  BNP 1059.1*    ProBNP (last 3 results) No results for input(s): PROBNP in the last 8760 hours.    Other results:  Imaging: Dg Chest Port 1 View  02/24/2015  CLINICAL DATA:  Followup respiratory failure EXAM: PORTABLE CHEST 1 VIEW COMPARISON:  Yesterday FINDINGS: Tracheostomy and enteric tubes are in unchanged position where visualized. Left upper  extremity PICC, tip at the lower SVC. Diffuse airspace disease with mild decrease in density. The pattern is stable from previous and remains nonspecific. Stable cardiomegaly post CABG. No effusion or air leak. IMPRESSION: 1. Mild improvement in diffuse airspace disease. 2. Unchanged positioning of tubes and central line. Electronically Signed   By: Marnee SpringJonathon  Watts M.D.   On: 02/24/2015 08:10   Dg Chest Port 1 View  02/23/2015  CLINICAL DATA:  CABG.  Shortness of breath. EXAM: PORTABLE CHEST 1 VIEW COMPARISON:  02/22/2015 FINDINGS: Changes of CABG. Tracheostomy tube is unchanged. There is cardiomegaly with diffuse bilateral airspace disease, not significantly changed. No visible effusions or pneumothorax. IMPRESSION: Stable diffuse bilateral airspace disease. Electronically Signed   By: Charlett NoseKevin  Dover M.D.   On: 02/23/2015 08:14     Medications:     Scheduled Medications: . antiseptic oral rinse  7 mL Mouth Rinse QID  . aspirin EC  325 mg Oral Daily   Or  . aspirin  324 mg Per Tube Daily  . chlorhexidine gluconate  15 mL Mouth Rinse BID  . clonazePAM  1 mg Oral BID  . enoxaparin (LOVENOX) injection  30 mg Subcutaneous Q24H  . fluconazole (DIFLUCAN) IV  100 mg Intravenous Q24H  . furosemide  40 mg Intravenous Q12H  . hydrALAZINE  10 mg Oral 3 times per day  . insulin aspart  0-24 Units Subcutaneous 6 times per day  . lactobacillus acidophilus  2 tablet Oral BID  . metolazone  2.5 mg Oral Daily  . metoprolol tartrate  12.5 mg Oral BID  . mupirocin ointment  1 application Topical BID  . nitroGLYCERIN  0.4 mg Transdermal Daily  . pantoprazole (PROTONIX) IV  40 mg Intravenous Q24H  . piperacillin-tazobactam (ZOSYN)  IV  3.375 g Intravenous Q8H  . QUEtiapine  100 mg Oral QHS  . sodium chloride  10-40 mL Intracatheter Q12H  . sodium chloride  3 mL Intravenous Q12H  . vancomycin  500 mg Intravenous Q24H    Infusions: . sodium chloride 10 mL/hr at 02/24/15 0800  .  dextrose 10 mL/hr at  02/22/15 1200  . feeding supplement (VITAL 1.5 CAL) Stopped (02/24/15 0000)  . fentaNYL infusion INTRAVENOUS 50 mcg/hr (02/24/15 0933)  . milrinone 0.125 mcg/kg/min (02/24/15 0800)    PRN Medications: Place/Maintain arterial line **AND** sodium chloride, acetaminophen (TYLENOL) oral liquid 160 mg/5 mL, albuterol, fentaNYL, magic mouthwash, midazolam, ondansetron (ZOFRAN) IV, potassium chloride, sodium chloride, sodium chloride   Assessment:   1. Cardiogenic shock   --s/p CABG with centrimag placement 02/01/15. Centrimag removed 9/30 2. CAD s/p CABG 3. 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 Acute respiratory failure. S/P Trach  5. Acute blood loss anemia 6. Thrombocytopenia: HIT negative by SRA 7. Hyponatremia 8. Hypokalemia  Plan/Discussion:    Not making much progress. Now with recurrent fevers. Remains intubated.   CVP 14-15. Continue IV lasix. CO-OX adequate. Continue milrinone 0.125 mcg another 24 hours.    Pending PEG today.  Considering LTACH.    Amy Clegg NP-C  9:55 AM  Patient seen and examined with Tonye Becket, NP. We discussed all aspects of the encounter. I agree with the assessment and plan as stated above.   No real change. Dr. Donata Clay would like to continue milrinone for now. I think that is reasonable. Continue IV diuresis.   Emmerie Battaglia,MD 4:37 PM

## 2015-02-24 NOTE — Progress Notes (Signed)
Patient transported to IR and back to room 2S08 without any complications.

## 2015-02-24 NOTE — Progress Notes (Signed)
      [address]       [place] [id number]             [phone]      Sleeping  BP 125/57 mmHg  Pulse 107  Temp(Src) 99.1 F (37.3 C) (Oral)  Resp 21  Ht 5' (1.524 m)  Wt 106 lb 14.8 oz (48.5 kg)  BMI 20.88 kg/m2  SpO2 100%   Intake/Output Summary (Last 24 hours) at 02/24/15 1818 Last data filed at 02/24/15 1627  Gross per 24 hour  Intake 1640.28 ml  Output   4625 ml  Net -2984.72 ml   K= 2.8 - replenish K  Viviann SpareSteven C. Dorris FetchHendrickson, MD [facility] [phone]

## 2015-02-24 NOTE — Care Management Note (Signed)
Case Management Note  Patient Details  Name: Salli QuarryMaria T Mackert MRN: [id number] Date of Birth: 1951/01/09  Subjective/Objective:   Received referral for LTAC - only insurance listed for pt is Medicaid which has no LTAC benefit.  Called husband who verified that pt has no other insurance.  Pt will likely need either vent or trach SNF, depending on ability to wean, when medically stable for transfer.                             Expected Discharge Plan:  Skilled Nursing Facility  In-House Referral:  Clinical Social Work  Discharge planning Services  CM Consult  Status of Service:  In process, will continue to follow  Magdalene RiverMayo, Gualberto Wahlen T, RN 02/24/2015, 11:48 AM

## 2015-02-25 ENCOUNTER — Inpatient Hospital Stay (HOSPITAL_COMMUNITY): Payer: Medicaid Other

## 2015-02-25 LAB — CBC
HCT: 28.3 % — ABNORMAL LOW (ref 36.0–46.0)
Hemoglobin: 9 g/dL — ABNORMAL LOW (ref 12.0–15.0)
MCH: 27.6 pg (ref 26.0–34.0)
MCHC: 31.8 g/dL (ref 30.0–36.0)
MCV: 86.8 fL (ref 78.0–100.0)
Platelets: 333 10*3/uL (ref 150–400)
RBC: 3.26 MIL/uL — ABNORMAL LOW (ref 3.87–5.11)
RDW: 17.4 % — ABNORMAL HIGH (ref 11.5–15.5)
WBC: 6.9 10*3/uL (ref 4.0–10.5)

## 2015-02-25 LAB — PROCALCITONIN: PROCALCITONIN: 0.73 ng/mL

## 2015-02-25 LAB — GLUCOSE, CAPILLARY
GLUCOSE-CAPILLARY: 109 mg/dL — AB (ref 65–99)
GLUCOSE-CAPILLARY: 85 mg/dL (ref 65–99)
Glucose-Capillary: 119 mg/dL — ABNORMAL HIGH (ref 65–99)
Glucose-Capillary: 145 mg/dL — ABNORMAL HIGH (ref 65–99)
Glucose-Capillary: 140 mg/dL — ABNORMAL HIGH (ref 65–99)
Glucose-Capillary: 103 mg/dL — ABNORMAL HIGH (ref 65–99)

## 2015-02-25 LAB — BLOOD GAS, ARTERIAL
Acid-Base Excess: 1.5 mmol/L (ref 0.0–2.0)
Bicarbonate: 25.2 mEq/L — ABNORMAL HIGH (ref 20.0–24.0)
Drawn by: 252031
FIO2: 0.3
MECHVT: 370 mL
O2 Saturation: 98.3 %
PEEP: 5 cmH2O
Patient temperature: 98.6
RATE: 14 resp/min
TCO2: 26.3 mmol/L (ref 0–100)
pCO2 arterial: 37.2 mmHg (ref 35.0–45.0)
pH, Arterial: 7.445 (ref 7.350–7.450)
pO2, Arterial: 108 mmHg — ABNORMAL HIGH (ref 80.0–100.0)

## 2015-02-25 LAB — COMPREHENSIVE METABOLIC PANEL
ALT: 67 U/L — ABNORMAL HIGH (ref 14–54)
AST: 26 U/L (ref 15–41)
Albumin: 1.9 g/dL — ABNORMAL LOW (ref 3.5–5.0)
Alkaline Phosphatase: 102 U/L (ref 38–126)
Anion gap: 13 (ref 5–15)
BUN: 47 mg/dL — ABNORMAL HIGH (ref 6–20)
CO2: 25 mmol/L (ref 22–32)
Calcium: 8.7 mg/dL — ABNORMAL LOW (ref 8.9–10.3)
Chloride: 106 mmol/L (ref 101–111)
Creatinine, Ser: 1.67 mg/dL — ABNORMAL HIGH (ref 0.44–1.00)
GFR calc Af Amer: 37 mL/min — ABNORMAL LOW (ref >60)
GFR calc non Af Amer: 32 mL/min — ABNORMAL LOW (ref >60)
Glucose, Bld: 119 mg/dL — ABNORMAL HIGH (ref 65–99)
Potassium: 3.9 mmol/L (ref 3.5–5.1)
Sodium: 144 mmol/L (ref 135–145)
Total Bilirubin: 2.7 mg/dL — ABNORMAL HIGH (ref 0.3–1.2)
Total Protein: 6.4 g/dL — ABNORMAL LOW (ref 6.5–8.1)

## 2015-02-25 LAB — CARBOXYHEMOGLOBIN
Carboxyhemoglobin: 2 % — ABNORMAL HIGH (ref 0.5–1.5)
Methemoglobin: 0.8 % (ref 0.0–1.5)
O2 Saturation: 72.2 %
Total hemoglobin: 13.5 g/dL (ref 12.0–16.0)

## 2015-02-25 LAB — CULTURE, RESPIRATORY W GRAM STAIN
Culture: NO GROWTH
Special Requests: NORMAL

## 2015-02-25 MED ORDER — GLYCOPYRROLATE 1 MG PO TABS
1.0000 mg | ORAL_TABLET | Freq: Three times a day (TID) | ORAL | Status: DC
  Administered 2015-02-25 – 2015-03-04 (×22): 1 mg via ORAL
  Filled 2015-02-25 (×23): qty 1

## 2015-02-25 MED ORDER — MIDAZOLAM HCL 2 MG/2ML IJ SOLN
INTRAMUSCULAR | Status: AC
  Filled 2015-02-25: qty 2

## 2015-02-25 MED ORDER — MIDAZOLAM HCL 2 MG/2ML IJ SOLN
INTRAMUSCULAR | Status: AC | PRN
  Administered 2015-02-25: 0.5 mg via INTRAVENOUS

## 2015-02-25 MED ORDER — ALBUMIN HUMAN 25 % IV SOLN
12.5000 g | Freq: Four times a day (QID) | INTRAVENOUS | Status: AC
  Administered 2015-02-25 (×3): 12.5 g via INTRAVENOUS
  Filled 2015-02-25 (×3): qty 50

## 2015-02-25 MED ORDER — FENTANYL CITRATE (PF) 100 MCG/2ML IJ SOLN
INTRAMUSCULAR | Status: AC
  Filled 2015-02-25: qty 2

## 2015-02-25 MED ORDER — IOHEXOL 300 MG/ML  SOLN
50.0000 mL | Freq: Once | INTRAMUSCULAR | Status: DC | PRN
  Administered 2015-02-25: 15 mL via INTRAVENOUS
  Filled 2015-02-25: qty 50

## 2015-02-25 MED ORDER — GLUCAGON HCL RDNA (DIAGNOSTIC) 1 MG IJ SOLR
INTRAMUSCULAR | Status: AC
  Filled 2015-02-25: qty 1

## 2015-02-25 MED ORDER — LIDOCAINE HCL 1 % IJ SOLN
INTRAMUSCULAR | Status: AC
Start: 2015-02-25 — End: 2015-02-26
  Filled 2015-02-25: qty 20

## 2015-02-25 MED ORDER — FUROSEMIDE 40 MG PO TABS
40.0000 mg | ORAL_TABLET | Freq: Every day | ORAL | Status: DC
  Administered 2015-02-25 – 2015-03-08 (×12): 40 mg via ORAL
  Filled 2015-02-25 (×13): qty 1

## 2015-02-25 MED ORDER — FENTANYL CITRATE (PF) 100 MCG/2ML IJ SOLN
INTRAMUSCULAR | Status: AC | PRN
  Administered 2015-02-25: 25 ug via INTRAVENOUS

## 2015-02-25 MED ORDER — BACITRACIN-NEOMYCIN-POLYMYXIN OINTMENT TUBE
1.0000 "application " | TOPICAL_OINTMENT | Freq: Every day | CUTANEOUS | Status: AC
  Administered 2015-02-25 – 2015-03-03 (×7): 1 via TOPICAL
  Filled 2015-02-25: qty 15

## 2015-02-25 NOTE — Progress Notes (Signed)
Patient ID: Breanna QuarryMaria T Alvarez, female   DOB: [birth year], 64 y.o.   MRN: [id number]  SICU Evening Rounds:  Hemodynamically stable off Milrinone  Had a PEG in IR today so not weaning on vent today  Good urine output.

## 2015-02-25 NOTE — Sedation Documentation (Signed)
Suctioned trach of moderate amount think, white secretions. Tolerated well.

## 2015-02-25 NOTE — Sedation Documentation (Signed)
Dressing to FT placed. Site dry and intact.

## 2015-02-25 NOTE — Progress Notes (Addendum)
Nutrition Follow Up  DOCUMENTATION CODES:   Not applicable  INTERVENTION:    Once G-tube able to be used, initiate Vital AF 1.2 formula at 20 ml/hr and increase by 10 ml every 4 hours to goal rate of 40 ml/hr  TF regimen to provide 1152 kcals, 72 gm protein, 778 ml of free water  NUTRITION DIAGNOSIS:   Inadequate oral intake related to inability to eat as evidenced by NPO status, ongoing  GOAL:   Patient will meet greater than or equal to 90% of their needs, progressing  MONITOR:   TF tolerance, Vent status, Labs, Weight trends, I & O's  ASSESSMENT:   64 yo female never smoker with hx HTN, CAD previously refused CABG ultimately admitted 9/20 for CVTS eval and CABG after ongoing chest pain. She underwent 4V CABG 9/26 and has had complicated course with cardiogenic shock s/p centrimag placement and removal, ischemic cardiomyopathy with EF 20%, open sternum now s/p sternal closure 10/3. She remains intubated since initial CABG 9/26 with poor weaning and PCCM now consulted to assist.   Patient s/p procedure 9/26: CORONARY ARTERY BYPASS GRAFTING x 4 PLACEMENT OF CENTRIMAG VENTRICULAR ASSIST DEVICE  Patient is currently on ventilator support via trach MV: 6.3 L/min Temp (24hrs), Avg:99.3 F (37.4 C), Min:99.1 F (37.3 C), Max:99.5 F (37.5 C)   Patient currently in IR for G-tube placement.  TPN discontinued 10/17.  TF (Vital 1.5 formula) re-initiated via postpyloric Panda tube.  Prior to procedure was infusing at 40 ml/hr.  Plan is to maintain G-tube to suction until tomorrow AM.  Can start TF then.  Diet Order:  Diet NPO time specified Except for: Sips with Meds  Skin:  sternal wound closed  Last BM:  10/19  Height:   Ht Readings from Last 1 Encounters:  02/23/15 5' (1.524 m)    Weight:   Wt Readings from Last 1 Encounters:  02/25/15 105 lb 2.6 oz (47.7 kg)    Ideal Body Weight:  45.4 kg  BMI:  Body mass index is 20.54 kg/(m^2).  Estimated Nutritional  Needs:   Kcal:  1170  Protein:  70-80 gm  Fluid:  per MD  EDUCATION NEEDS:   No education needs identified at this time  Maureen ChattersKatie Shantana Christon, RD, LDN Pager #: [phone] After-Hours Pager #: [phone]

## 2015-02-25 NOTE — Progress Notes (Signed)
RT Note: Sutures removed per MD with no complications. RT will continue to monitor.

## 2015-02-25 NOTE — Sedation Documentation (Signed)
Suctioned trach of moderate amount thick, white secretions. Tolerated well.

## 2015-02-25 NOTE — Progress Notes (Signed)
Advanced Heart Failure Rounding Note   Subjective:    S/P CABG with Centrimag placement 02/01/15  Centrimag explanted 9/30 Sternal Closure 10/3. 10/7 Extubated, reintubated 2 hours later.   Off vent this morning.  Continues on milrinone 0.25 mcg. Todays CO-OX is 72%. CVP 6. On broad spectrum abx and anti-fungal coverage. Blood cultures pending.   ECHO 10/15 EF 25-30%  RV not visualized. Peak PA pressure 36 mm hg.    Objective:   Weight Range:  Vital Signs:   Temp:  [99.1 F (37.3 C)-99.5 F (37.5 C)] 99.2 F (37.3 C) (10/20 0336) Pulse Rate:  [32-131] 97 (10/20 0800) Resp:  [17-34] 22 (10/20 0800) BP: (74-156)/(36-86) 103/86 mmHg (10/20 0800) SpO2:  [98 %-100 %] 100 % (10/20 0800) Arterial Line BP: (74-157)/(38-84) 102/84 mmHg (10/20 0800) FiO2 (%):  [30 %-40 %] 30 % (10/20 0400) Weight:  [105 lb 2.6 oz (47.7 kg)] 105 lb 2.6 oz (47.7 kg) (10/20 0600) Last BM Date: 02/24/15 (flexiseal)  Weight change: Filed Weights   02/23/15 0600 02/24/15 0400 02/25/15 0600  Weight: 111 lb 1.8 oz (50.4 kg) 106 lb 14.8 oz (48.5 kg) 105 lb 2.6 oz (47.7 kg)    Intake/Output:   Intake/Output Summary (Last 24 hours) at 02/25/15 0817 Last data filed at 02/25/15 0800  Gross per 24 hour  Intake 1859.18 ml  Output   4445 ml  Net -2585.82 ml     Physical Exam:  CVP 6 General:  On trach collar.  Awake. HEENT: normal x ETT. Neck: supple. + trach Cor: Chest with dressing. Tachy regular Lungs: CTA Abdomen: soft, nontender, nondistended. Hypoactive bowel sounds. Extremities: no cyanosis, clubbing, rash,  No edema in thighs.   Neuro: awake on vent    Telemetry: Sinus tach ~100  Labs: Basic Metabolic Panel:  Recent Labs Lab 02/19/15 0525  02/20/15 0600 02/21/15 0426  02/21/15 2145 02/22/15 0340 02/23/15 0451 02/23/15 1456 02/24/15 0315 02/24/15 1521 02/24/15 2329 02/25/15 0340  NA 144  < > 142 140  < > 138 142 144 146* 145 146* 145 144  K 3.9  < > 3.5 2.4*  < > 3.2*  4.1 3.3* 3.7 3.1* 2.8* 3.4* 3.9  CL 111  < > 104 101  < > 102 106 108 110 105 106  --  106  CO2 22  --  25 25  < > --  27  --   --  25  GLUCOSE 122*  < > 166* 153*  < > 165* 133* 112* 96 122* 97 108* 119*  BUN 67*  < > 74* 81*  < > 78* 77* 67* 56* 58* 47*  --  47*  CREATININE 1.54*  < > 1.70* 1.72*  < > 1.68* 1.64* 1.64* 1.70* 1.80* 1.60*  --  1.67*  CALCIUM 8.7*  --  8.4* 8.0*  < > 8.3* 8.5* 8.7*  --  8.7*  --   --  8.7*  MG 2.4  --  2.2 1.7  --   --  2.3  --   --   --   --   --   --   PHOS 4.5  --  6.6* 4.6  --   --  3.7  --   --   --   --   --   --   < > = values in this interval not displayed.  Liver Function Tests:  Recent Labs Lab 02/21/15 0426 02/22/15 0340 02/23/15 0451 02/24/15  0315 02/25/15 0340  AST 80* 67* 67* 33 26  ALT 126* 129* 126* 88* 67*  ALKPHOS 151* 132* 135* 121 102  BILITOT 3.3* 3.9* 3.8* 3.1* 2.7*  PROT 6.4* 6.6 6.9 6.3* 6.4*  ALBUMIN 2.0* 2.0* 2.0* 1.9* 1.9*   No results for input(s): LIPASE, AMYLASE in the last 168 hours. No results for input(s): AMMONIA in the last 168 hours.  CBC:  Recent Labs Lab 02/21/15 0426 02/22/15 0340 02/23/15 0451 02/23/15 1456 02/24/15 0315 02/24/15 1521 02/24/15 2329 02/25/15 0340  WBC 11.4* 10.2 11.3*  --  9.1  --   --  6.9  NEUTROABS  --  7.6  --   --   --   --   --   --   HGB 8.1* 7.9* 8.1* 10.9* 8.7* 11.2* 9.2* 9.0*  HCT 26.3* 24.8* 25.2* 32.0* 27.2* 33.0* 27.0* 28.3*  MCV 88.9 88.3 88.1  --  86.9  --   --  86.8  PLT 357 377 369  --  314  --   --  333    Cardiac Enzymes: No results for input(s): CKTOTAL, CKMB, CKMBINDEX, TROPONINI in the last 168 hours.  BNP: BNP (last 3 results)  Recent Labs  07/10/14 1315  BNP 1059.1*    ProBNP (last 3 results) No results for input(s): PROBNP in the last 8760 hours.    Other results:  Imaging: Dg Chest Port 1 View  02/25/2015  CLINICAL DATA:  Followup after CABG, shortness of breath EXAM: PORTABLE CHEST 1 VIEW COMPARISON:  02/24/2015 FINDINGS:  Tracheostomy tube, NG tubes, and left PICC line unchanged. Status post CABG. There is bilateral hypoventilatory change. There are stable cardiac silhouette mild enlargement with vascular congestion and indistinct vasculature. Bilateral multifocal patchy airspace disease is unchanged. IMPRESSION: Stable appearance when compared to prior study patchy bilateral airspace opacification Electronically Signed   By: Esperanza Heir M.D.   On: 02/25/2015 08:08   Dg Chest Port 1 View  02/24/2015  CLINICAL DATA:  Followup respiratory failure EXAM: PORTABLE CHEST 1 VIEW COMPARISON:  Yesterday FINDINGS: Tracheostomy and enteric tubes are in unchanged position where visualized. Left upper extremity PICC, tip at the lower SVC. Diffuse airspace disease with mild decrease in density. The pattern is stable from previous and remains nonspecific. Stable cardiomegaly post CABG. No effusion or air leak. IMPRESSION: 1. Mild improvement in diffuse airspace disease. 2. Unchanged positioning of tubes and central line. Electronically Signed   By: Marnee Spring M.D.   On: 02/24/2015 08:10     Medications:     Scheduled Medications: . albumin human  12.5 g Intravenous Q6H  . antiseptic oral rinse  7 mL Mouth Rinse QID  . aspirin EC  325 mg Oral Daily   Or  . aspirin  324 mg Per Tube Daily  . chlorhexidine gluconate  15 mL Mouth Rinse BID  . clonazePAM  1 mg Oral BID  . [START ON 02/26/2015] enoxaparin (LOVENOX) injection  30 mg Subcutaneous Q24H  . fluconazole (DIFLUCAN) IV  100 mg Intravenous Q24H  . furosemide  40 mg Intravenous Q12H  . hydrALAZINE  10 mg Oral 3 times per day  . insulin aspart  0-24 Units Subcutaneous 6 times per day  . lactobacillus acidophilus  2 tablet Oral BID  . metoprolol tartrate  12.5 mg Oral BID  . mupirocin ointment  1 application Topical BID  . nitroGLYCERIN  0.4 mg Transdermal Daily  . pantoprazole (PROTONIX) IV  40 mg Intravenous Q24H  . piperacillin-tazobactam (  ZOSYN)  IV  3.375 g  Intravenous Q8H  . QUEtiapine  100 mg Oral QHS  . sodium chloride  10-40 mL Intracatheter Q12H  . sodium chloride  3 mL Intravenous Q12H  . vancomycin  500 mg Intravenous Q24H    Infusions: . sodium chloride Stopped (02/24/15 1526)  . dextrose 10 mL/hr at 02/22/15 1200  . dextrose 5 % and 0.45% NaCl 50 mL/hr at 02/24/15 1851  . feeding supplement (VITAL 1.5 CAL) Stopped (02/24/15 1400)  . fentaNYL infusion INTRAVENOUS 100 mcg/hr (02/24/15 1900)  . milrinone 0.125 mcg/kg/min (02/24/15 1900)    PRN Medications: Place/Maintain arterial line **AND** sodium chloride, acetaminophen (TYLENOL) oral liquid 160 mg/5 mL, albuterol, fentaNYL, magic mouthwash, midazolam, ondansetron (ZOFRAN) IV, sodium chloride, sodium chloride   Assessment:   1. Cardiogenic shock   --s/p CABG with centrimag placement 02/01/15. Centrimag removed 9/30 2. CAD s/p CABG 3. 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 Acute respiratory failure. S/P Trach  5. Acute blood loss anemia 6. Thrombocytopenia: HIT negative by SRA 7. Hyponatremia 8. Hypokalemia  Plan/Discussion:    Off vent this morning. On trach collar.    CVP 6. Stop IV lasix. Start lasix 40 mg po daily. CO-OX 72%. Stop milrinone. Renal function stable.   For PEG today  Has Medicaid so LTACH not an option.    Amy Clegg NP-C  8:17 AM  Patient seen and examined with Tonye BecketAmy Clegg, NP. We discussed all aspects of the encounter. I agree with the assessment and plan as stated above.   Volume status much improved. Agree with switching to po lasix and stopping milrinone. PEG in place. Will need SNF.   Makaelyn Aponte,MD 10:38 PM

## 2015-02-25 NOTE — Significant Event (Signed)
A new bag of fentanyl given to incoming RN Gabe to be administer when existing bag is depleted. Has about ~25cc left in current bag. Salina Stanfield, RCharity fundraiser

## 2015-02-25 NOTE — Sedation Documentation (Signed)
Patient is resting comfortably. 

## 2015-02-25 NOTE — Progress Notes (Signed)
PULMONARY / CRITICAL CARE MEDICINE   Name: Breanna Alvarez MRN: [id number] DOB: [birth year]    ADMISSION DATE:  01/26/2015 CONSULTATION DATE:  02/09/2105  REFERRING MD :  Donata Clay   CHIEF COMPLAINT:  Vent management   INITIAL PRESENTATION:  64 yo female admitted with chest pain from CAD.  She had CABG 02/01/15 >> post-op complicated by cardiogenic shock s/p centrimag placement and removal, ischemic cardiomyopathy with EF 20%, open sternum.  She remained on vent post-op and PCCM consulted to assist with vent weaning.  STUDIES:  9/21 Echo >> EF 20-25%, mod MR, PA press 51 mmHg 10/15 Echo >> EF 25 to 30%, PAS 36 mmHg  SIGNIFICANT EVENTS: 09/26 CABG x 4, placement of centrimag L ventricular assistive device  09/30 removal centrimag  10/03 sternal closure 10/09 Hg drop overnight by 1 gm and transfusion ordered 10/10 Trach by DF 10/12 Off precedex 10/13 possible aspiration event; TNA started 10/14 difficulty with agitation 10/16 off pressors 10/17 off TNA 10/18 Fever 101, transfuse 1 unit PRBC 10/20 start trach collar trials  SUBJECTIVE: Tolerating trach collar.  For G tube today >> delayed yesterday to allow clearing of lovenox.  VITAL SIGNS: Temp:  [99.1 F (37.3 C)-99.5 F (37.5 C)] 99.4 F (37.4 C) (10/20 0800) Pulse Rate:  [32-131] 105 (10/20 0825) Resp:  [17-34] 26 (10/20 0825) BP: (74-156)/(36-86) 135/66 mmHg (10/20 0825) SpO2:  [98 %-100 %] 100 % (10/20 0800) Arterial Line BP: (74-157)/(38-84) 102/84 mmHg (10/20 0800) FiO2 (%):  [30 %-40 %] 40 % (10/20 0825) Weight:  [105 lb 2.6 oz (47.7 kg)] 105 lb 2.6 oz (47.7 kg) (10/20 0600)   HEMODYNAMICS: CVP:  [6 mmHg-15 mmHg] 6 mmHg   VENTILATOR SETTINGS: Vent Mode:  [-] PRVC FiO2 (%):  [30 %-40 %] 40 % Set Rate:  [14 bmp] 14 bmp Vt Set:  [370 mL] 370 mL PEEP:  [5 cmH20] 5 cmH20 Plateau Pressure:  [20 cmH20-23 cmH20] 21 cmH20  INTAKE / OUTPUT:  Intake/Output Summary (Last 24 hours) at 02/25/15 0847 Last data  filed at 02/25/15 0800  Gross per 24 hour  Intake 1882.78 ml  Output   4445 ml  Net -2562.22 ml   PHYSICAL EXAMINATION: General: pleasant Neuro: RASS 0, moves all extremities HEENT: trach site clean Cardiovascular: regular, tachycardic, sternal wound clean Lungs: faint crackles, no wheeze Abdomen: soft, non tender Musculoskeletal: no edema  LABS: PULMONARY  Recent Labs Lab 02/21/15 0430  02/22/15 0343 02/23/15 0420 02/23/15 0529 02/23/15 1456 02/24/15 0320 02/24/15 0339 02/24/15 1521 02/25/15 0350  PHART 7.368  --  7.365 7.390  --   --  7.392  --   --  7.445  PCO2ART 41.4  --  43.6 38.3  --   --  44.4  --   --  37.2  PO2ART 131*  --  116* 141*  --   --  107.0*  --   --  108*  HCO3 23.2  --  24.3* 22.5  --   --  26.7*  --   --  25.2*  TCO2 24.5  --  25.6 23.6  --  24 28  --  25 26.3  O2SAT 71.5  98.7  < > 98.6 98.9 76.6  --  98.0 82.3  --  72.2  98.3  < > = values in this interval not displayed.  CBC  Recent Labs Lab 02/23/15 0451  02/24/15 0315 02/24/15 1521 02/24/15 2329 02/25/15 0340  HGB 8.1*  < > 8.7* 11.2* 9.2* 9.0*  HCT 25.2*  < > 27.2* 33.0* 27.0* 28.3*  WBC 11.3*  --  9.1  --   --  6.9  PLT 369  --  314  --   --  333  < > = values in this interval not displayed.   CHEMISTRY  Recent Labs Lab 02/19/15 0525  02/20/15 0600 02/21/15 0426  02/21/15 2145 02/22/15 0340 02/23/15 0451 02/23/15 1456 02/24/15 0315 02/24/15 1521 02/24/15 2329 02/25/15 0340  NA 144  < > 142 140  < > 138 142 144 146* 145 146* 145 144  K 3.9  < > 3.5 2.4*  < > 3.2* 4.1 3.3* 3.7 3.1* 2.8* 3.4* 3.9  CL 111  < > 104 101  < > 102 106 108 110 105 106  --  106  CO2 22  --  25 25  < > --  27  --   --  25  GLUCOSE 122*  < > 166* 153*  < > 165* 133* 112* 96 122* 97 108* 119*  BUN 67*  < > 74* 81*  < > 78* 77* 67* 56* 58* 47*  --  47*  CREATININE 1.54*  < > 1.70* 1.72*  < > 1.68* 1.64* 1.64* 1.70* 1.80* 1.60*  --  1.67*  CALCIUM 8.7*  --  8.4* 8.0*  < > 8.3* 8.5*  8.7*  --  8.7*  --   --  8.7*  MG 2.4  --  2.2 1.7  --   --  2.3  --   --   --   --   --   --   PHOS 4.5  --  6.6* 4.6  --   --  3.7  --   --   --   --   --   --   < > = values in this interval not displayed. Estimated Creatinine Clearance: 25.1 mL/min (by C-G formula based on Cr of 1.67).  LIVER  Recent Labs Lab 02/21/15 0426 02/22/15 0340 02/23/15 0451 02/24/15 0315 02/25/15 0340  AST 80* 67* 67* 33 26  ALT 126* 129* 126* 88* 67*  ALKPHOS 151* 132* 135* 121 102  BILITOT 3.3* 3.9* 3.8* 3.1* 2.7*  PROT 6.4* 6.6 6.9 6.3* 6.4*  ALBUMIN 2.0* 2.0* 2.0* 1.9* 1.9*  INR  --   --   --  1.48  --    ENDOCRINE CBG (last 3)   Recent Labs  02/24/15 1923 02/25/15 0326 02/25/15 0741  GLUCAP 84 109* 119*    Iron/TIBC/Ferritin/ %Sat    Component Value Date/Time   IRON 19* 02/22/2015 0900   TIBC 143* 02/22/2015 0900   FERRITIN 1025* 02/22/2015 0900   IRONPCTSAT 13 02/22/2015 0900    IMAGING x48h Dg Chest Port 1 View  02/25/2015  CLINICAL DATA:  Followup after CABG, shortness of breath EXAM: PORTABLE CHEST 1 VIEW COMPARISON:  02/24/2015 FINDINGS: Tracheostomy tube, NG tubes, and left PICC line unchanged. Status post CABG. There is bilateral hypoventilatory change. There are stable cardiac silhouette mild enlargement with vascular congestion and indistinct vasculature. Bilateral multifocal patchy airspace disease is unchanged. IMPRESSION: Stable appearance when compared to prior study patchy bilateral airspace opacification Electronically Signed   By: Esperanza Heir M.D.   On: 02/25/2015 08:08   Dg Chest Port 1 View  02/24/2015  CLINICAL DATA:  Followup respiratory failure EXAM: PORTABLE CHEST 1 VIEW COMPARISON:  Yesterday FINDINGS: Tracheostomy and enteric tubes are in unchanged position where visualized. Left upper extremity  PICC, tip at the lower SVC. Diffuse airspace disease with mild decrease in density. The pattern is stable from previous and remains nonspecific. Stable  cardiomegaly post CABG. No effusion or air leak. IMPRESSION: 1. Mild improvement in diffuse airspace disease. 2. Unchanged positioning of tubes and central line. Electronically Signed   By: Marnee SpringJonathon  Watts M.D.   On: 02/24/2015 08:10    ASSESSMENT / PLAN:  PULMONARY ETT 9/26 >> 10/07 Trach (DF) 10/10 >> A: Acute on chronic respiratory failure 2nd to acute pulmonary edema/cardiogenic shock after CABG, deconditioning, and aspiration pneumonia. Failure to wean from vent s/p tracheostomy. P:   Pressure support wean to trach collar as tolerated Will get speech to assess for PM valve if she can maintain herself off vent F/u CXR Trach care, bronchial hygiene D/c trach sutures 10/20 Prn albuterol  CARDIOVASCULAR Lt PICC 10/03 >>  Lt brachial aline 10/13 >> A: CAD s/p CABG.  Acute on chronic systolic heart failure with ischemic CM. Cardiogenic shock. HLD >> reported muscle aches with Crestor. P:  Milrinone per cardiology >> likely wean off soon Continue ASA, lopressor, NTG patch Lasix per TCTS and cardiology  RENAL A: AKI in setting of cardiogenic shock >> baseline creatinine 0.93 from 10/01/14 Hypokalemia. P:   Monitor renal fx, urine outpt Replace electrolytes as needed  GASTROINTESTINAL A: Protein calorie malnutrition. Elevated LFT's >> trending down P:   Tube feeds F/u LFTs intermittently Protonix for SUP For G tube placement by IR 10/20  HEMATOLOGIC A: Anemia of critical illness. P:  F/u CBC Lovenox for DVT prevention >> on hold for G tube placement  INFECTIOUS A: Aspiration pneumonia 10/13. P:   Day 9/10 of zosyn  Blood 10/18 >>  ENDOCRINE A: Hyperglycemia. P:   SSI  NEUROLOGIC A: Agitated delirium. Deconditioning. P:   Continue seroquel, klonopin Fentanyl gtt >> wean off as tolerated PRN versed RASS goal 0 Mobilize as able >> likely get PT to start working with her once she has G tube  DISPOSITION: Would be good candidate for LTAC >> not  eligible with Medicaid.  Disposition will depend on how well she weans off vent.  D/w Dr. Donata ClayVan Trigt.  Coralyn HellingVineet Morrissa Shein, MD [facility] 02/25/2015, 8:47 AM Pager:  [phone] After 3pm call: [phone]

## 2015-02-25 NOTE — Progress Notes (Signed)
TCTS DAILY ICU PROGRESS NOTE                   [address]            [place] [id number]          [id number]   17 Days Post-Op Procedure(s) (LRB): STERNAL CLOSURE (N/A) TRANSESOPHAGEAL ECHOCARDIOGRAM (TEE) (N/A)  Total Length of Stay:  LOS: 30 days   Subjective:  Remains intubated via trach.  She is awake, able to squeeze hands, denies pain  Objective: Vital signs in last 24 hours: Temp:  [99.1 F (37.3 C)-99.5 F (37.5 C)] 99.2 F (37.3 C) (10/20 0336) Pulse Rate:  [32-131] 107 (10/20 0700) Cardiac Rhythm:  [-] Sinus tachycardia (10/20 0400) Resp:  [17-34] 18 (10/20 0700) BP: (74-156)/(36-78) 97/51 mmHg (10/20 0700) SpO2:  [98 %-100 %] 100 % (10/20 0700) Arterial Line BP: (74-157)/(38-70) 90/44 mmHg (10/20 0700) FiO2 (%):  [30 %-40 %] 30 % (10/20 0400) Weight:  [105 lb 2.6 oz (47.7 kg)] 105 lb 2.6 oz (47.7 kg) (10/20 0600)  Filed Weights   02/23/15 0600 02/24/15 0400 02/25/15 0600  Weight: 111 lb 1.8 oz (50.4 kg) 106 lb 14.8 oz (48.5 kg) 105 lb 2.6 oz (47.7 kg)    Weight change: -1 lb 12.2 oz (-0.8 kg)   Hemodynamic parameters for last 24 hours: CVP:  [9 mmHg-15 mmHg] 9 mmHg  Intake/Output from previous day: 10/19 0701 - 10/20 0700 In: 1930.9 [I.V.:950.9; NG/GT:280; IV Piggyback:700] Out: 4570 [Urine:3870; Emesis/NG output:600; Stool:100]  Current Meds: Scheduled Meds: . albumin human  12.5 g Intravenous Q6H  . antiseptic oral rinse  7 mL Mouth Rinse QID  . aspirin EC  325 mg Oral Daily   Or  . aspirin  324 mg Per Tube Daily  . chlorhexidine gluconate  15 mL Mouth Rinse BID  . clonazePAM  1 mg Oral BID  . [START ON 02/26/2015] enoxaparin (LOVENOX) injection  30 mg Subcutaneous Q24H  . fluconazole (DIFLUCAN) IV  100 mg Intravenous Q24H  . furosemide  40 mg Intravenous Q12H  . hydrALAZINE  10 mg Oral 3 times per day  . insulin aspart  0-24 Units Subcutaneous 6 times per day  . lactobacillus acidophilus  2 tablet Oral BID  . metoprolol  tartrate  12.5 mg Oral BID  . mupirocin ointment  1 application Topical BID  . nitroGLYCERIN  0.4 mg Transdermal Daily  . pantoprazole (PROTONIX) IV  40 mg Intravenous Q24H  . piperacillin-tazobactam (ZOSYN)  IV  3.375 g Intravenous Q8H  . QUEtiapine  100 mg Oral QHS  . sodium chloride  10-40 mL Intracatheter Q12H  . sodium chloride  3 mL Intravenous Q12H  . vancomycin  500 mg Intravenous Q24H   Continuous Infusions: . sodium chloride Stopped (02/24/15 1526)  . dextrose 10 mL/hr at 02/22/15 1200  . dextrose 5 % and 0.45% NaCl 50 mL/hr at 02/24/15 1851  . feeding supplement (VITAL 1.5 CAL) Stopped (02/24/15 1400)  . fentaNYL infusion INTRAVENOUS 100 mcg/hr (02/24/15 1900)  . milrinone 0.125 mcg/kg/min (02/24/15 1900)   PRN Meds:.Place/Maintain arterial line **AND** sodium chloride, acetaminophen (TYLENOL) oral liquid 160 mg/5 mL, albuterol, fentaNYL, magic mouthwash, midazolam, ondansetron (ZOFRAN) IV, sodium chloride, sodium chloride  General appearance: intubated via trach Heart: regular rate and rhythm Lungs: clear to auscultation bilaterally Abdomen: soft, non-tender; bowel sounds normal; no masses,  no organomegaly Extremities: edema none appreciated Wound: clean and dry  Lab Results: CBC: Recent Labs  02/24/15  0315  02/24/15 2329 02/25/15 0340  WBC 9.1  --   --  6.9  HGB 8.7*  < > 9.2* 9.0*  HCT 27.2*  < > 27.0* 28.3*  PLT 314  --   --  333  < > = values in this interval not displayed. BMET:  Recent Labs  02/24/15 0315 02/24/15 1521 02/24/15 2329 02/25/15 0340  NA 145 146* 145 144  K 3.1* 2.8* 3.4* 3.9  CL 105 106  --  106  CO2 27  --   --  25  GLUCOSE 122* 97 108* 119*  BUN 58* 47*  --  47*  CREATININE 1.80* 1.60*  --  1.67*  CALCIUM 8.7*  --   --  8.7*    PT/INR:  Recent Labs  02/24/15 0315  LABPROT 18.0*  INR 1.48   Radiology: No results found.   Assessment/Plan: S/P Procedure(s) (LRB): STERNAL CLOSURE (N/A) TRANSESOPHAGEAL ECHOCARDIOGRAM  (TEE) (N/A)  1. CV- remains on low dose Milrinone, HF following 2. Pulm- on trach, have been unable to wean from vent, on broad spectrum ABX/Fungal coverage- sputum remains frothy-- CCM managing 3. Renal- creatinine stable at 1.67, potassium improved at 3.9, mild hypervolemia- on Lasix IV BID 4. ID- urine and blood cultures show no growth to date, continues to have low grade fever 5. Dispo- patient not making much progress, CSW asked to investigate LTAC... Patient does not have coverage for this, continue current care   Breanna Alvarez 02/25/2015 8:06 AM

## 2015-02-25 NOTE — Progress Notes (Signed)
ANTIBIOTIC CONSULT NOTE - FOLLOW UP  Pharmacy Consult for Vancomycin/Zosyn/Diflucan Indication:new fevers, HCAP, sternal wound  Assessment: ID: Spike fever 10/18 101.2 now afebrile, wbc 11> wnl, concerned for HCAP and aspiration placed on vanc/zosyn/diflucan. Vancomycin had been stopped 10/16 but with new fevers it was restarted 10/18  Scr 1.6, uop 2.4L (362ml/kg/hr)  Zinacef 10/1>>10/2 (4 doses)  Vanc 9/26>>9/30, restarted 10/3>>10/16, 10/18>> 10/1 VR = 16 (vanc stopped night before)  10/5 VT = 24 (confirmed with RN that she drew it before dose was hung, once level resulted she stopped the infusion so pt only received a little) will extend interval to q24  10/9 VT =22-received dose after level, will again extend to q36h  10/6 Ceftazidime>>10/12  10/12 zosyn >>  10/18 Diflucan> 10/22 9/27 bld cx: 1/1 ng  9/30 wound: ng 10/4 TA - ng 10/12 resp ZO:XWRUcx:ngtd 10/13 bld x2:ngG 10/17 urine cx: ngF 10/18 bld x2: ngtd   Goal of Therapy:  Vancomycin trough level 15-20 mcg/ml  Appropriate Zosyn dosing  Plan:   Vancomycin 500 mg iv Q 24 hours -will need to check level in AM -Continue Zosyn 3.375 grams iv Q 8 hours (start date 10/11) Diflucan 100mg  qd end 10/22     Allergies  Allergen Reactions  . Atorvastatin Other (See Comments)    Gas, chest tightness  . Crestor [Rosuvastatin Calcium] Other (See Comments)    Muscle aches   Labs:  Recent Labs  02/23/15 0451  02/24/15 0315 02/24/15 1521 02/24/15 2329 02/25/15 0340  WBC 11.3*  --  9.1  --   --  6.9  HGB 8.1*  < > 8.7* 11.2* 9.2* 9.0*  PLT 369  --  314  --   --  333  CREATININE 1.64*  < > 1.80* 1.60*  --  1.67*  < > = values in this interval not displayed. Estimated Creatinine Clearance: 25.1 mL/min (by C-G formula based on Cr of 1.67). No results for input(s): VANCOTROUGH, VANCOPEAK, VANCORANDOM, GENTTROUGH, GENTPEAK, GENTRANDOM, TOBRATROUGH, TOBRAPEAK, TOBRARND, AMIKACINPEAK, AMIKACINTROU, AMIKACIN in the last 72 hours.    Leota SauersLisa Vitoria Conyer Pharm.D. CPP, BCPS Clinical Pharmacist [phone] 02/25/2015 2:00 PM

## 2015-02-25 NOTE — Procedures (Signed)
Interventional Radiology Procedure Note  Procedure: Placement of percutaneous 20F pull-through gastrostomy tube. Complications: None Recommendations: - NPO except for sips and chips remainder of today and overnight - Maintain G-tube to LWS until tomorrow morning  - May advance diet as tolerated and begin using tube tomorrow morning  Signed,   Rieley Khalsa S. Kel Senn, DO   

## 2015-02-25 NOTE — Progress Notes (Signed)
Attempted to see patient for trach team follow up.  Patient currently not in the room.  All needed equipment at bedside.  Will continue to follow.

## 2015-02-26 ENCOUNTER — Inpatient Hospital Stay (HOSPITAL_COMMUNITY): Payer: Medicaid Other

## 2015-02-26 DIAGNOSIS — J189 Pneumonia, unspecified organism: Secondary | ICD-10-CM

## 2015-02-26 LAB — GLUCOSE, CAPILLARY
GLUCOSE-CAPILLARY: 126 mg/dL — AB (ref 65–99)
GLUCOSE-CAPILLARY: 168 mg/dL — AB (ref 65–99)
Glucose-Capillary: 101 mg/dL — ABNORMAL HIGH (ref 65–99)
Glucose-Capillary: 106 mg/dL — ABNORMAL HIGH (ref 65–99)
Glucose-Capillary: 175 mg/dL — ABNORMAL HIGH (ref 65–99)

## 2015-02-26 LAB — COMPREHENSIVE METABOLIC PANEL
ALT: 47 U/L (ref 14–54)
AST: 23 U/L (ref 15–41)
Albumin: 2.4 g/dL — ABNORMAL LOW (ref 3.5–5.0)
Alkaline Phosphatase: 101 U/L (ref 38–126)
Anion gap: 12 (ref 5–15)
BUN: 35 mg/dL — ABNORMAL HIGH (ref 6–20)
CO2: 27 mmol/L (ref 22–32)
Calcium: 8.4 mg/dL — ABNORMAL LOW (ref 8.9–10.3)
Chloride: 101 mmol/L (ref 101–111)
Creatinine, Ser: 1.54 mg/dL — ABNORMAL HIGH (ref 0.44–1.00)
GFR calc Af Amer: 41 mL/min — ABNORMAL LOW (ref >60)
GFR calc non Af Amer: 35 mL/min — ABNORMAL LOW (ref >60)
Glucose, Bld: 120 mg/dL — ABNORMAL HIGH (ref 65–99)
Potassium: 2.5 mmol/L — CL (ref 3.5–5.1)
Sodium: 140 mmol/L (ref 135–145)
Total Bilirubin: 2.4 mg/dL — ABNORMAL HIGH (ref 0.3–1.2)
Total Protein: 6.7 g/dL (ref 6.5–8.1)

## 2015-02-26 LAB — POCT I-STAT 3, ART BLOOD GAS (G3+)
ACID-BASE EXCESS: 4 mmol/L — AB (ref 0.0–2.0)
Acid-Base Excess: 4 mmol/L — ABNORMAL HIGH (ref 0.0–2.0)
BICARBONATE: 28.1 meq/L — AB (ref 20.0–24.0)
Bicarbonate: 27.7 mEq/L — ABNORMAL HIGH (ref 20.0–24.0)
O2 SAT: 98 %
O2 Saturation: 97 %
PCO2 ART: 37.8 mmHg (ref 35.0–45.0)
PH ART: 7.475 — AB (ref 7.350–7.450)
PH ART: 7.438 (ref 7.350–7.450)
PO2 ART: 94 mmHg (ref 80.0–100.0)
TCO2: 29 mmol/L (ref 0–100)
TCO2: 29 mmol/L (ref 0–100)
pCO2 arterial: 41.9 mmHg (ref 35.0–45.0)
pO2, Arterial: 104 mmHg — ABNORMAL HIGH (ref 80.0–100.0)

## 2015-02-26 LAB — CARBOXYHEMOGLOBIN
Carboxyhemoglobin: 1.8 % — ABNORMAL HIGH (ref 0.5–1.5)
Methemoglobin: 0.8 % (ref 0.0–1.5)
O2 Saturation: 77.1 %
Total hemoglobin: 9.4 g/dL — ABNORMAL LOW (ref 12.0–16.0)

## 2015-02-26 LAB — POCT I-STAT, CHEM 8
BUN: 33 mg/dL — AB (ref 6–20)
Calcium, Ion: 1.14 mmol/L (ref 1.13–1.30)
Chloride: 104 mmol/L (ref 101–111)
Creatinine, Ser: 1.4 mg/dL — ABNORMAL HIGH (ref 0.44–1.00)
Glucose, Bld: 201 mg/dL — ABNORMAL HIGH (ref 65–99)
HCT: 37 % (ref 36.0–46.0)
HEMOGLOBIN: 12.6 g/dL (ref 12.0–15.0)
POTASSIUM: 2.9 mmol/L — AB (ref 3.5–5.1)
SODIUM: 143 mmol/L (ref 135–145)
TCO2: 24 mmol/L (ref 0–100)

## 2015-02-26 LAB — CBC
HCT: 30 % — ABNORMAL LOW (ref 36.0–46.0)
Hemoglobin: 9.4 g/dL — ABNORMAL LOW (ref 12.0–15.0)
MCH: 27.3 pg (ref 26.0–34.0)
MCHC: 31.3 g/dL (ref 30.0–36.0)
MCV: 87.2 fL (ref 78.0–100.0)
Platelets: 304 10*3/uL (ref 150–400)
RBC: 3.44 MIL/uL — ABNORMAL LOW (ref 3.87–5.11)
RDW: 17.1 % — ABNORMAL HIGH (ref 11.5–15.5)
WBC: 6.8 10*3/uL (ref 4.0–10.5)

## 2015-02-26 LAB — VANCOMYCIN, TROUGH: Vancomycin Tr: 17 ug/mL (ref 10.0–20.0)

## 2015-02-26 MED ORDER — POTASSIUM CHLORIDE 10 MEQ/50ML IV SOLN
10.0000 meq | INTRAVENOUS | Status: AC
  Administered 2015-02-26 (×2): 10 meq via INTRAVENOUS
  Filled 2015-02-26 (×2): qty 50

## 2015-02-26 MED ORDER — VITAL AF 1.2 CAL PO LIQD
1000.0000 mL | ORAL | Status: DC
  Administered 2015-02-27 – 2015-02-28 (×2): 1000 mL
  Filled 2015-02-26 (×5): qty 1000

## 2015-02-26 MED ORDER — POTASSIUM CHLORIDE 10 MEQ/50ML IV SOLN
INTRAVENOUS | Status: AC
  Administered 2015-02-26: 10 meq via INTRAVENOUS
  Filled 2015-02-26: qty 150

## 2015-02-26 MED ORDER — VITAL HIGH PROTEIN PO LIQD: 1000.0000 mL | ORAL | Status: DC

## 2015-02-26 MED ORDER — POTASSIUM CHLORIDE 20 MEQ/15ML (10%) PO SOLN
40.0000 meq | ORAL | Status: AC
  Administered 2015-02-26 (×2): 40 meq
  Filled 2015-02-26 (×4): qty 30

## 2015-02-26 MED ORDER — FENTANYL CITRATE (PF) 100 MCG/2ML IJ SOLN
100.0000 ug | INTRAMUSCULAR | Status: DC | PRN
  Administered 2015-02-26: 50 ug via INTRAVENOUS
  Administered 2015-02-27 – 2015-03-04 (×9): 100 ug via INTRAVENOUS
  Filled 2015-02-26 (×10): qty 2

## 2015-02-26 MED ORDER — POTASSIUM CHLORIDE 10 MEQ/50ML IV SOLN
10.0000 meq | INTRAVENOUS | Status: AC
  Administered 2015-02-26 (×2): 10 meq via INTRAVENOUS

## 2015-02-26 MED ORDER — PIPERACILLIN-TAZOBACTAM 3.375 G IVPB
3.3750 g | Freq: Three times a day (TID) | INTRAVENOUS | Status: AC
  Administered 2015-02-26 (×2): 3.375 g via INTRAVENOUS
  Filled 2015-02-26 (×2): qty 50

## 2015-02-26 MED ORDER — MIDAZOLAM HCL 2 MG/2ML IJ SOLN
2.0000 mg | INTRAMUSCULAR | Status: DC | PRN
  Administered 2015-03-01: 2 mg via INTRAVENOUS
  Filled 2015-02-26: qty 2

## 2015-02-26 MED ORDER — POTASSIUM CHLORIDE 10 MEQ/50ML IV SOLN
10.0000 meq | INTRAVENOUS | Status: AC | PRN
  Administered 2015-02-26 (×3): 10 meq via INTRAVENOUS

## 2015-02-26 NOTE — Progress Notes (Signed)
18 Days Post-Op Procedure(s) (LRB): STERNAL CLOSURE (N/A) TRANSESOPHAGEAL ECHOCARDIOGRAM (TEE) (N/A) Subjective:  CABG-LVAD for CAD, EF .15 preop  COPD , aspiration from TF and swallow dysfunction Trach , OOB for PS weans PEG placed Milrinone weaned off- nsr, last echo ef .35 Objective: Vital signs in last 24 hours: Temp:  [98.7 F (37.1 C)-103 F (39.4 C)] 99.3 F (37.4 C) (10/21 0700) Pulse Rate:  [88-123] 95 (10/21 0700) Cardiac Rhythm:  [-] Normal sinus rhythm (10/20 2000) Resp:  [16-32] 22 (10/21 0700) BP: (89-169)/(40-120) 118/59 mmHg (10/21 0700) SpO2:  [97 %-100 %] 100 % (10/21 0700) Arterial Line BP: (86-173)/(35-86) 134/57 mmHg (10/21 0700) FiO2 (%):  [40 %] 40 % (10/21 0315) Weight:  [101 lb 6.6 oz (46 kg)] 101 lb 6.6 oz (46 kg) (10/21 0600)  Hemodynamic parameters for last 24 hours: CVP:  [6 mmHg-16 mmHg] 6 mmHg  Intake/Output from previous day: 10/20 0701 - 10/21 0700 In: 2539.2 [I.V.:2004.2; NG/GT:30; IV Piggyback:475] Out: 3120 [Urine:2640; Emesis/NG output:180; Stool:300] Intake/Output this shift:    Sedated CXR clearing Neuro intact  Lab Results:  Recent Labs  02/25/15 0340 02/26/15 0316  WBC 6.9 6.8  HGB 9.0* 9.4*  HCT 28.3* 30.0*  PLT 333 304   BMET:  Recent Labs  02/25/15 0340 02/26/15 0316  NA 144 140  K 3.9 2.5*  CL 106 101  CO2 25 27  GLUCOSE 119* 120*  BUN 47* 35*  CREATININE 1.67* 1.54*  CALCIUM 8.7* 8.4*    PT/INR:  Recent Labs  02/24/15 0315  LABPROT 18.0*  INR 1.48   ABG    Component Value Date/Time   PHART 7.445 02/25/2015 0350   HCO3 25.2* 02/25/2015 0350   TCO2 26.3 02/25/2015 0350   ACIDBASEDEF 1.6 02/23/2015 0420   O2SAT 72.2 02/25/2015 0350   O2SAT 98.3 02/25/2015 0350   CBG (last 3)   Recent Labs  02/25/15 1924 02/25/15 2328 02/26/15 0350  GLUCAP 103* 85 101*    Assessment/Plan: S/P Procedure(s) (LRB): STERNAL CLOSURE (N/A) TRANSESOPHAGEAL ECHOCARDIOGRAM (TEE)  (N/A) Mobilize Diuresis Pressure support weaning   LOS: 31 days    Kathlee Nationseter Van Trigt III 02/26/2015

## 2015-02-26 NOTE — Progress Notes (Signed)
Advanced Heart Failure Rounding Note   Subjective:    S/P CABG with Centrimag placement 02/01/15  Centrimag explanted 9/30 Sternal Closure 10/3. 10/7 Extubated, reintubated 2 hours later.   Remains on trach collar. S/p PEG. Diuresing well. No significant change.   ECHO 10/15 EF 25-30%  RV not visualized. Peak PA pressure 36 mm hg.    Objective:   Weight Range:  Vital Signs:   Temp:  [98.8 F (37.1 C)-103 F (39.4 C)] 100.5 F (38.1 C) (10/21 1553) Pulse Rate:  [88-132] 107 (10/21 1700) Resp:  [16-34] 27 (10/21 1700) BP: (89-168)/(40-81) 152/68 mmHg (10/21 1700) SpO2:  [98 %-100 %] 98 % (10/21 1700) Arterial Line BP: (86-175)/(35-80) 148/54 mmHg (10/21 1400) FiO2 (%):  [40 %] 40 % (10/21 1700) Weight:  [46 kg (101 lb 6.6 oz)] 46 kg (101 lb 6.6 oz) (10/21 0600) Last BM Date: 02/24/15 (flexiseal)  Weight change: Filed Weights   02/24/15 0400 02/25/15 0600 02/26/15 0600  Weight: 48.5 kg (106 lb 14.8 oz) 47.7 kg (105 lb 2.6 oz) 46 kg (101 lb 6.6 oz)    Intake/Output:   Intake/Output Summary (Last 24 hours) at 02/26/15 1731 Last data filed at 02/26/15 1700  Gross per 24 hour  Intake 2571.96 ml  Output   3155 ml  Net -583.04 ml     Physical Exam:  6 General:  On trach collar.  Awake. HEENT: normal x ETT. Neck: supple. + trach Cor: Chest with dressing. Tachy regular Lungs: CTA Abdomen: soft, nontender, nondistended. Hypoactive bowel sounds. +PEG Extremities: no cyanosis, clubbing, rash,  No edema in thighs.   Neuro: awake on vent    Telemetry: Sinus tach ~100  Labs: Basic Metabolic Panel:  Recent Labs Lab 02/20/15 0600 02/21/15 0426  02/22/15 0340 02/23/15 0451  02/24/15 0315 02/24/15 1521 02/24/15 2329 02/25/15 0340 02/26/15 0316 02/26/15 1554  NA 142 140  < > 142 144  < > 145 146* 145 144 140 143  K 3.5 2.4*  < > 4.1 3.3*  < > 3.1* 2.8* 3.4* 3.9 2.5* 2.9*  CL 104 101  < > 106 108  < > 105 106  --  106 101 104  CO2 25 25  < > 26 25  --  27   --   --  25 27  --   GLUCOSE 166* 153*  < > 133* 112*  < > 122* 97 108* 119* 120* 201*  BUN 74* 81*  < > 77* 67*  < > 58* 47*  --  47* 35* 33*  CREATININE 1.70* 1.72*  < > 1.64* 1.64*  < > 1.80* 1.60*  --  1.67* 1.54* 1.40*  CALCIUM 8.4* 8.0*  < > 8.5* 8.7*  --  8.7*  --   --  8.7* 8.4*  --   MG 2.2 1.7  --  2.3  --   --   --   --   --   --   --   --   PHOS 6.6* 4.6  --  3.7  --   --   --   --   --   --   --   --   < > = values in this interval not displayed.  Liver Function Tests:  Recent Labs Lab 02/22/15 0340 02/23/15 0451 02/24/15 0315 02/25/15 0340 02/26/15 0316  AST 67* 67* 33 26 23  ALT 129* 126* 88* 67* 47  ALKPHOS 132* 135* 121 102 101  BILITOT 3.9* 3.8*  3.1* 2.7* 2.4*  PROT 6.6 6.9 6.3* 6.4* 6.7  ALBUMIN 2.0* 2.0* 1.9* 1.9* 2.4*   No results for input(s): LIPASE, AMYLASE in the last 168 hours. No results for input(s): AMMONIA in the last 168 hours.  CBC:  Recent Labs Lab 02/22/15 0340 02/23/15 0451  02/24/15 0315 02/24/15 1521 02/24/15 2329 02/25/15 0340 02/26/15 0316 02/26/15 1554  WBC 10.2 11.3*  --  9.1  --   --  6.9 6.8  --   NEUTROABS 7.6  --   --   --   --   --   --   --   --   HGB 7.9* 8.1*  < > 8.7* 11.2* 9.2* 9.0* 9.4* 12.6  HCT 24.8* 25.2*  < > 27.2* 33.0* 27.0* 28.3* 30.0* 37.0  MCV 88.3 88.1  --  86.9  --   --  86.8 87.2  --   PLT 377 369  --  314  --   --  333 304  --   < > = values in this interval not displayed.  Cardiac Enzymes: No results for input(s): CKTOTAL, CKMB, CKMBINDEX, TROPONINI in the last 168 hours.  BNP: BNP (last 3 results)  Recent Labs  07/10/14 1315  BNP 1059.1*    ProBNP (last 3 results) No results for input(s): PROBNP in the last 8760 hours.    Other results:  Imaging: Ir Gastrostomy Tube Mod Sed  02/25/2015  CLINICAL DATA:  64 year old female with a history of dysphagia, aspiration. She has been referred for percutaneous gastrostomy placement. EXAM: PERCUTANEOUS GASTROSTOMY FLUOROSCOPY TIME:  2 minutes  12 seconds MEDICATIONS AND MEDICAL HISTORY: Versed 0.5 mg, Fentanyl 25 mcg. ANESTHESIA/SEDATION: Moderate sedation time: 15 minutes CONTRAST:  10 cc through the enteric tube PROCEDURE: The procedure, risks, benefits, and alternatives were explained to the patient's family. Questions regarding the procedure were encouraged and answered. The patient understands and consents to the procedure. The epigastrium was prepped with Betadine in a sterile fashion, and a sterile drape was applied covering the operative field. A sterile gown and sterile gloves were used for the procedure. A 5-French orogastric tube is placed under fluoroscopic guidance. Scout imaging of the abdomen confirms barium within the transverse colon. The stomach was distended with gas. Under fluoroscopic guidance, an 18 gauge needle was utilized to puncture the anterior wall of the body of the stomach. An Amplatz wire was advanced through the needle passing a T fastener into the lumen of the stomach. The T fastener was secured for gastropexy. A 9-French sheath was inserted. A snare was advanced through the 9-French sheath. A Teena Dunk was advanced through the orogastric tube. It was snared then pulled out the oral cavity, pulling the snare, as well. The leading edge of the gastrostomy was attached to the snare. It was then pulled down the esophagus and out the percutaneous site. It was secured in place. Contrast was injected. No complication. COMPLICATIONS: None FINDINGS: The image demonstrates placement of a 20-French pull-through type gastrostomy tube into the body of the stomach. IMPRESSION: Status post percutaneous gastrostomy, with 20 French pull-through type tube. Signed, Yvone Neu. Loreta Ave, DO [facility] Radiology Electronically Signed   By: Gilmer Mor D.O.   On: 02/25/2015 16:36   Dg Chest Port 1 View  02/26/2015  CLINICAL DATA:  64 year old female with ventilator dependent respiratory failure  EXAM: PORTABLE CHEST 1 VIEW COMPARISON:  Prior chest x-ray 02/25/2015 FINDINGS: Tracheostomy tube remains in good position with the tip midline and  at the level of the clavicles. The enteric feeding tube is been removed. The nasogastric tube remains in place. The tip is not visualized but lies below the diaphragm presumably within the stomach. There is a left extremity approach PICC the tip of which overlies the superior cavoatrial junction. Patient is status post median sternotomy with evidence of multivessel CABG. Stable cardiac and mediastinal contours. Bilateral interstitial and patchy airspace opacities appear unchanged. Probably small bilateral layering pleural effusions. No acute osseous abnormality. IMPRESSION: 1. Interval removal of enteric feeding tube. 2. Otherwise, no significant interval change in the appearance of the chest. Persistent bilateral patchy interstitial and airspace opacities and small bilateral layering pleural effusions. Differential considerations remain broad and include ARDS, pulmonary edema and less likely multifocal pneumonia or pneumonitis. Electronically Signed   By: Malachy MoanHeath  McCullough M.D.   On: 02/26/2015 07:57   Dg Chest Port 1 View  02/25/2015  CLINICAL DATA:  Followup after CABG, shortness of breath EXAM: PORTABLE CHEST 1 VIEW COMPARISON:  02/24/2015 FINDINGS: Tracheostomy tube, NG tubes, and left PICC line unchanged. Status post CABG. There is bilateral hypoventilatory change. There are stable cardiac silhouette mild enlargement with vascular congestion and indistinct vasculature. Bilateral multifocal patchy airspace disease is unchanged. IMPRESSION: Stable appearance when compared to prior study patchy bilateral airspace opacification Electronically Signed   By: Esperanza Heiraymond  Rubner M.D.   On: 02/25/2015 08:08     Medications:     Scheduled Medications: . antiseptic oral rinse  7 mL Mouth Rinse QID  . aspirin EC  325 mg Oral Daily   Or  . aspirin  324 mg Per Tube  Daily  . chlorhexidine gluconate  15 mL Mouth Rinse BID  . clonazePAM  1 mg Oral BID  . enoxaparin (LOVENOX) injection  30 mg Subcutaneous Q24H  . [START ON 02/27/2015] feeding supplement (VITAL AF 1.2 CAL)  1,000 mL Per Tube Q24H  . furosemide  40 mg Oral Daily  . glycopyrrolate  1 mg Oral TID  . hydrALAZINE  10 mg Oral 3 times per day  . insulin aspart  0-24 Units Subcutaneous 6 times per day  . lactobacillus acidophilus  2 tablet Oral BID  . metoprolol tartrate  12.5 mg Oral BID  . mupirocin ointment  1 application Topical BID  . neomycin-bacitracin-polymyxin  1 application Topical Daily  . nitroGLYCERIN  0.4 mg Transdermal Daily  . pantoprazole (PROTONIX) IV  40 mg Intravenous Q24H  . piperacillin-tazobactam (ZOSYN)  IV  3.375 g Intravenous Q8H  . potassium chloride  40 mEq Per Tube Q3H  . QUEtiapine  100 mg Oral QHS  . sodium chloride  10-40 mL Intracatheter Q12H  . sodium chloride  3 mL Intravenous Q12H    Infusions: . sodium chloride Stopped (02/24/15 1526)  . dextrose 10 mL/hr at 02/26/15 0700  . dextrose 5 % and 0.45% NaCl 50 mL/hr at 02/26/15 1326    PRN Medications: Place/Maintain arterial line **AND** sodium chloride, acetaminophen (TYLENOL) oral liquid 160 mg/5 mL, albuterol, fentaNYL (SUBLIMAZE) injection, magic mouthwash, midazolam, ondansetron (ZOFRAN) IV, sodium chloride, sodium chloride   Assessment:   1. Cardiogenic shock   --s/p CABG with centrimag placement 02/01/15. Centrimag removed 9/30 2. CAD s/p CABG 3. 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 Acute respiratory failure. S/P Trach  5. Acute blood loss anemia 6. Thrombocytopenia: HIT negative by SRA 7. Hyponatremia 8. Hypokalemia  Plan/Discussion:     Volume status much improved. Stable off milrinone. Continue po lasix. Supp K+. Follow renal function.  Bensimhon, Daniel,MD 5:31 PM

## 2015-02-26 NOTE — Progress Notes (Signed)
Patient ID: Salli QuarryMaria T Bottoms, female   DOB: [birth year], 64 y.o.   MRN: [id number] EVENING ROUNDS NOTE :     [address]       [place] [id number]             [phone]                 18 Days Post-Op Procedure(s) (LRB): STERNAL CLOSURE (N/A) TRANSESOPHAGEAL ECHOCARDIOGRAM (TEE) (N/A)  Total Length of Stay:  LOS: 31 days  BP 125/62 mmHg  Pulse 126  Temp(Src) 101.5 F (38.6 C) (Core (Comment))  Resp 36  Ht 5' (1.524 m)  Wt 101 lb 6.6 oz (46 kg)  BMI 19.81 kg/m2  SpO2 100%  .Intake/Output      10/21 0701 - 10/22 0700   I.V. (mL/kg) 864.6 (18.8)   Other 60   NG/GT 280   IV Piggyback 225   Total Intake(mL/kg) 1429.6 (31.1)   Urine (mL/kg/hr) 1915 (2.9)   Emesis/NG output    Drains 0 (0)   Stool 300 (0.5)   Total Output 2215   Net -785.4       Stool Occurrence 1 x     . sodium chloride Stopped (02/24/15 1526)  . dextrose 10 mL/hr at 02/26/15 0700  . dextrose 5 % and 0.45% NaCl 50 mL/hr at 02/26/15 1326     Lab Results  Component Value Date   WBC 6.8 02/26/2015   HGB 12.6 02/26/2015   HCT 37.0 02/26/2015   PLT 304 02/26/2015   GLUCOSE 201* 02/26/2015   CHOL 223* 01/27/2015   TRIG 171* 02/22/2015   HDL 25* 01/27/2015   LDLCALC 179* 01/27/2015   ALT 47 02/26/2015   AST 23 02/26/2015   NA 143 02/26/2015   K 2.9* 02/26/2015   CL 104 02/26/2015   CREATININE 1.40* 02/26/2015   BUN 33* 02/26/2015   CO2 27 02/26/2015   TSH 3.572 07/14/2014   INR 1.48 02/24/2015   HGBA1C 6.3* 01/30/2015   Remains on vent   Delight OvensEdward B Amara Justen MD  Beeper [phone] Office [phone] 02/26/2015 9:23 PM

## 2015-02-26 NOTE — Progress Notes (Signed)
S/p perc G-tube yesterday. NG remains in as well.  BP 163/67 mmHg  Pulse 111  Temp(Src) 99.9 F (37.7 C) (Core (Comment))  Resp 28  Ht 5' (1.524 m)  Wt 101 lb 6.6 oz (46 kg)  BMI 19.81 kg/m2  SpO2 100% NG to LWS, scant bilious output G-tube intact, site clean except small amount old blood(post-procedure). Also hooked to [place] with minimal output   OK to begin use of G-tube about 1200. Can probably remove NG as well.  Brayton ElKevin Thomasine Klutts PA-C Interventional Radiology 02/26/2015 9:29 AM

## 2015-02-26 NOTE — Significant Event (Signed)
RN wasted approximately 210cc of fentanyl in sink and flushed, witnessed with another RN Wardell HonourNicole Zaffaris.   Patient tolerating trach collar, has been on it since 0814am, tolerating sitting in chair. Charrisse Masley, Charity fundraiserN.

## 2015-02-26 NOTE — Progress Notes (Signed)
Physical Therapy Treatment Patient Details Name: Breanna QuarryMaria T Alvarez MRN: [id number] DOB: [birth year] Today's Date: 02/26/2015    History of Present Illness Breanna Alvarez is a 64 y/o female who has PMHx of CAD, CHF and had an NSTEMI in ealy 2016, present to ED with ongoing CP.  She is now s/p CABG as of 9/27 with Centrimag placement, 9/30 Centrimag removal, 10/3 sternal closure, Wound I and D 10/4, failed extubation 10/7, 10/10 trached, ?aspiration on 10/13 and worsening status.     [facility].    PT Comments    Pt moving upper and lower extremities spontaneously, but not to command.  She offers little to no assist for mobility at this time.  She does tolerate sitting EOB and working on sitting tolerance in the recliner.  Follow Up Recommendations  SNF     Equipment Recommendations  Other (comment)    Recommendations for Other Services       Precautions / Restrictions Precautions Precautions: Fall    Mobility  Bed Mobility Overal bed mobility: Needs Assistance;+2 for physical assistance Bed Mobility: Supine to Sit     Supine to sit: Total assist;+2 for physical assistance     General bed mobility comments: Again no discernible truncal  or extremity assist  Transfers Overall transfer level: Needs assistance   Transfers: Squat Pivot Transfers     Squat pivot transfers: Total assist;+2 safety/equipment     General transfer comment: pt unable to assist any of the transfer.  Ambulation/Gait                 Stairs            Wheelchair Mobility    Modified Rankin (Stroke Patients Only)       Balance Overall balance assessment: Needs assistance Sitting-balance support: Feet supported;Single extremity supported;No upper extremity supported Sitting balance-Leahy Scale: Zero Sitting balance - Comments: sat 8-10 min EOB working to elicit truncal reactions, using startle, propped on elbows, leaning outside BOS and just working for  general sitting tolerance                            Cognition Arousal/Alertness: Lethargic (but eyes open greater percentage of time) Behavior During Therapy: Flat affect;WFL for tasks assessed/performed Overall Cognitive Status: Impaired/Different from baseline                      Exercises      General Comments General comments (skin integrity, edema, etc.): VSS throughout      Pertinent Vitals/Pain Pain Assessment: Faces Faces Pain Scale: No hurt    Home Living                      Prior Function            PT Goals (current goals can now be found in the care plan section) Acute Rehab PT Goals Patient Stated Goal: pt unable to participate, no family present PT Goal Formulation: Patient unable to participate in goal setting Time For Goal Achievement: 03/12/15 Potential to Achieve Goals: Fair Progress towards PT goals: Not progressing toward goals - comment (Little to no voluntary response to command.)    Frequency  Min 3X/week    PT Plan Current plan remains appropriate    Co-evaluation             End of Session Equipment Utilized During Treatment: Oxygen (40 %  TC) Activity Tolerance: Patient limited by fatigue;Patient limited by lethargy Patient left: in chair;with call bell/phone within reach;Other (comment);with SCD's reapplied     Time: 1100-1139 PT Time Calculation (min) (ACUTE ONLY): 39 min  Charges:  $Therapeutic Activity: 23-37 mins                    G Codes:      Tarik Teixeira, Eliseo Gum 02/26/2015, 1:14 PM 02/26/2015  [place] Bing, PT [phone]  (pager)

## 2015-02-26 NOTE — Progress Notes (Signed)
PULMONARY / CRITICAL CARE MEDICINE   Name: Breanna Alvarez MRN: [id number] DOB: 12-24-1950    ADMISSION DATE:  01/26/2015 CONSULTATION DATE:  02/09/2105  REFERRING MD :  Donata ClayVan Trigt   CHIEF COMPLAINT:  Vent management   INITIAL PRESENTATION:  64 yo female admitted with chest pain from CAD.  She had CABG 02/01/15 >> post-op complicated by cardiogenic shock s/p centrimag placement and removal, ischemic cardiomyopathy with EF 20%, open sternum.  She remained on vent post-op and PCCM consulted to assist with vent weaning.  STUDIES:  9/21 Echo >> EF 20-25%, mod MR, PA press 51 mmHg 10/15 Echo >> EF 25 to 30%, PAS 36 mmHg  SIGNIFICANT EVENTS: 09/26 CABG x 4, placement of centrimag L ventricular assistive device  09/30 removal centrimag  10/03 sternal closure 10/09 Hg drop overnight by 1 gm and transfusion ordered 10/10 Trach by DF 10/12 Off precedex 10/13 possible aspiration event; TNA started 10/14 difficulty with agitation 10/16 off pressors 10/17 off TNA 10/18 Fever 101, transfuse 1 unit PRBC 10/20 start trach collar trials; off milrinone; G tube placed  SUBJECTIVE: Tolerating trach collar.  Denies chest/abd pain.  VITAL SIGNS: Temp:  [98.7 F (37.1 C)-103 F (39.4 C)] 99.3 F (37.4 C) (10/21 0700) Pulse Rate:  [88-123] 111 (10/21 0814) Resp:  [16-32] 28 (10/21 0814) BP: (89-169)/(40-120) 163/67 mmHg (10/21 0814) SpO2:  [97 %-100 %] 100 % (10/21 0700) Arterial Line BP: (86-173)/(35-86) 134/57 mmHg (10/21 0700) FiO2 (%):  [40 %] 40 % (10/21 0814) Weight:  [101 lb 6.6 oz (46 kg)] 101 lb 6.6 oz (46 kg) (10/21 0600)   HEMODYNAMICS: CVP:  [6 mmHg-16 mmHg] 6 mmHg   VENTILATOR SETTINGS: Vent Mode:  [-] PRVC FiO2 (%):  [40 %] 40 % Set Rate:  [14 bmp] 14 bmp Vt Set:  [370 mL] 370 mL PEEP:  [5 cmH20] 5 cmH20 Plateau Pressure:  [21 cmH20-27 cmH20] 22 cmH20  INTAKE / OUTPUT:  Intake/Output Summary (Last 24 hours) at 02/26/15 0843 Last data filed at 02/26/15 0700  Gross  per 24 hour  Intake 2477.38 ml  Output   3020 ml  Net -542.62 ml   PHYSICAL EXAMINATION: General: pleasant Neuro: RASS 0, moves all extremities HEENT: trach site clean Cardiovascular: regular, tachycardic, sternal wound clean Lungs: faint crackles, no wheeze Abdomen: soft, non tender, G tube site clean Musculoskeletal: no edema  LABS: PULMONARY  Recent Labs Lab 02/22/15 0343 02/23/15 0420  02/23/15 1456 02/24/15 0320 02/24/15 0339 02/24/15 1521 02/25/15 0350 02/26/15 0755 02/26/15 0810  PHART 7.365 7.390  --   --  7.392  --   --  7.445  --  7.475*  PCO2ART 43.6 38.3  --   --  44.4  --   --  37.2  --  37.8  PO2ART 116* 141*  --   --  107.0*  --   --  108*  --  104.0*  HCO3 24.3* 22.5  --   --  26.7*  --   --  25.2*  --  27.7*  TCO2 25.6 23.6  --  24 28  --  25 26.3  --  29  O2SAT 98.6 98.9  < >  --  98.0 82.3  --  72.2  98.3 77.1 98.0  < > = values in this interval not displayed.  CBC  Recent Labs Lab 02/24/15 0315  02/24/15 2329 02/25/15 0340 02/26/15 0316  HGB 8.7*  < > 9.2* 9.0* 9.4*  HCT 27.2*  < > 27.0* 28.3*  30.0*  WBC 9.1  --   --  6.9 6.8  PLT 314  --   --  333 304  < > = values in this interval not displayed.   CHEMISTRY  Recent Labs Lab 02/20/15 0600 02/21/15 0426  02/22/15 0340 02/23/15 0451 02/23/15 1456 02/24/15 0315 02/24/15 1521 02/24/15 2329 02/25/15 0340 02/26/15 0316  NA 142 140  < > 142 144 146* 145 146* 145 144 140  K 3.5 2.4*  < > 4.1 3.3* 3.7 3.1* 2.8* 3.4* 3.9 2.5*  CL 104 101  < > 106 108 110 105 106  --  106 101  CO2 25 25  < > 26 25  --  27  --   --  25 27  GLUCOSE 166* 153*  < > 133* 112* 96 122* 97 108* 119* 120*  BUN 74* 81*  < > 77* 67* 56* 58* 47*  --  47* 35*  CREATININE 1.70* 1.72*  < > 1.64* 1.64* 1.70* 1.80* 1.60*  --  1.67* 1.54*  CALCIUM 8.4* 8.0*  < > 8.5* 8.7*  --  8.7*  --   --  8.7* 8.4*  MG 2.2 1.7  --  2.3  --   --   --   --   --   --   --   PHOS 6.6* 4.6  --  3.7  --   --   --   --   --   --   --   <  > = values in this interval not displayed. Estimated Creatinine Clearance: 27.2 mL/min (by C-G formula based on Cr of 1.54).  LIVER  Recent Labs Lab 02/22/15 0340 02/23/15 0451 02/24/15 0315 02/25/15 0340 02/26/15 0316  AST 67* 67* 33 26 23  ALT 129* 126* 88* 67* 47  ALKPHOS 132* 135* 121 102 101  BILITOT 3.9* 3.8* 3.1* 2.7* 2.4*  PROT 6.6 6.9 6.3* 6.4* 6.7  ALBUMIN 2.0* 2.0* 1.9* 1.9* 2.4*  INR  --   --  1.48  --   --    ENDOCRINE CBG (last 3)   Recent Labs  02/25/15 2328 02/26/15 0350 02/26/15 0733  GLUCAP 85 101* 106*    Iron/TIBC/Ferritin/ %Sat    Component Value Date/Time   IRON 19* 02/22/2015 0900   TIBC 143* 02/22/2015 0900   FERRITIN 1025* 02/22/2015 0900   IRONPCTSAT 13 02/22/2015 0900    IMAGING x48h Ir Gastrostomy Tube Mod Sed  02/25/2015  CLINICAL DATA:  64 year old female with a history of dysphagia, aspiration. She has been referred for percutaneous gastrostomy placement. EXAM: PERCUTANEOUS GASTROSTOMY FLUOROSCOPY TIME:  2 minutes 12 seconds MEDICATIONS AND MEDICAL HISTORY: Versed 0.5 mg, Fentanyl 25 mcg. ANESTHESIA/SEDATION: Moderate sedation time: 15 minutes CONTRAST:  10 cc through the enteric tube PROCEDURE: The procedure, risks, benefits, and alternatives were explained to the patient's family. Questions regarding the procedure were encouraged and answered. The patient understands and consents to the procedure. The epigastrium was prepped with Betadine in a sterile fashion, and a sterile drape was applied covering the operative field. A sterile gown and sterile gloves were used for the procedure. A 5-French orogastric tube is placed under fluoroscopic guidance. Scout imaging of the abdomen confirms barium within the transverse colon. The stomach was distended with gas. Under fluoroscopic guidance, an 18 gauge needle was utilized to puncture the anterior wall of the body of the stomach. An Amplatz wire was advanced through the needle passing a T fastener  into the lumen of  the stomach. The T fastener was secured for gastropexy. A 9-French sheath was inserted. A snare was advanced through the 9-French sheath. A Teena Dunk was advanced through the orogastric tube. It was snared then pulled out the oral cavity, pulling the snare, as well. The leading edge of the gastrostomy was attached to the snare. It was then pulled down the esophagus and out the percutaneous site. It was secured in place. Contrast was injected. No complication. COMPLICATIONS: None FINDINGS: The image demonstrates placement of a 20-French pull-through type gastrostomy tube into the body of the stomach. IMPRESSION: Status post percutaneous gastrostomy, with 20 French pull-through type tube. Signed, Yvone Neu. Loreta Ave, DO [facility] Radiology Electronically Signed   By: Gilmer Mor D.O.   On: 02/25/2015 16:36   Dg Chest Port 1 View  02/26/2015  CLINICAL DATA:  64 year old female with ventilator dependent respiratory failure EXAM: PORTABLE CHEST 1 VIEW COMPARISON:  Prior chest x-ray 02/25/2015 FINDINGS: Tracheostomy tube remains in good position with the tip midline and at the level of the clavicles. The enteric feeding tube is been removed. The nasogastric tube remains in place. The tip is not visualized but lies below the diaphragm presumably within the stomach. There is a left extremity approach PICC the tip of which overlies the superior cavoatrial junction. Patient is status post median sternotomy with evidence of multivessel CABG. Stable cardiac and mediastinal contours. Bilateral interstitial and patchy airspace opacities appear unchanged. Probably small bilateral layering pleural effusions. No acute osseous abnormality. IMPRESSION: 1. Interval removal of enteric feeding tube. 2. Otherwise, no significant interval change in the appearance of the chest. Persistent bilateral patchy interstitial and airspace opacities and small bilateral layering  pleural effusions. Differential considerations remain broad and include ARDS, pulmonary edema and less likely multifocal pneumonia or pneumonitis. Electronically Signed   By: Malachy Moan M.D.   On: 02/26/2015 07:57   Dg Chest Port 1 View  02/25/2015  CLINICAL DATA:  Followup after CABG, shortness of breath EXAM: PORTABLE CHEST 1 VIEW COMPARISON:  02/24/2015 FINDINGS: Tracheostomy tube, NG tubes, and left PICC line unchanged. Status post CABG. There is bilateral hypoventilatory change. There are stable cardiac silhouette mild enlargement with vascular congestion and indistinct vasculature. Bilateral multifocal patchy airspace disease is unchanged. IMPRESSION: Stable appearance when compared to prior study patchy bilateral airspace opacification Electronically Signed   By: Esperanza Heir M.D.   On: 02/25/2015 08:08    ASSESSMENT / PLAN:  PULMONARY ETT 9/26 >> 10/07 Trach (DF) 10/10 >> A: Acute on chronic respiratory failure 2nd to acute pulmonary edema/cardiogenic shock after CABG, deconditioning, and aspiration pneumonia. Failure to wean from vent s/p tracheostomy. P:   Pressure support wean to trach collar as tolerated Will get speech to assess for PM valve if she can maintain herself off vent F/u CXR Trach care, bronchial hygiene Continue robinul for respiratory secretions Prn albuterol  CARDIOVASCULAR Lt PICC 10/03 >>  Lt brachial aline 10/13 >>  A: CAD s/p CABG.  Acute on chronic systolic heart failure with ischemic CM. Cardiogenic shock. HLD >> reported muscle aches with Crestor. P:  Continue ASA, lopressor, NTG patch Lasix per TCTS and cardiology  RENAL A: AKI in setting of cardiogenic shock >> baseline creatinine 0.93 from 10/01/14 Hypokalemia. P:   Monitor renal fx, urine outpt Replace electrolytes as needed  GASTROINTESTINAL A: Protein calorie malnutrition. Elevated LFT's >> trending down. Dysphagia s/p G tube by IR 10/20 P:   Tube feeds F/u LFTs  intermittently Protonix for  SUP  HEMATOLOGIC A: Anemia of critical illness. P:  F/u CBC Lovenox for DVT prevention >> on hold for G tube placement  INFECTIOUS A: Aspiration pneumonia 10/13. P:   Day 10/10 of zosyn >> d/c after dose on 10/21  Blood 10/18 >>  ENDOCRINE A: Hyperglycemia. P:   SSI  NEUROLOGIC A: Agitated delirium. Deconditioning. P:   Continue seroquel, klonopin Fentanyl gtt >> wean off as tolerated if she remains off vent PRN versed RASS goal 0 PT/OT assessment  DISPOSITION: Would be good candidate for LTAC >> not eligible with Medicaid.  Disposition will depend on how well she weans off vent.   Coralyn Helling, MD [facility] Pulmonary/Critical Care 02/26/2015, 8:43 AM Pager:  [phone] After 3pm call: [phone]

## 2015-02-27 LAB — BASIC METABOLIC PANEL WITH GFR
Anion gap: 10 (ref 5–15)
BUN: 32 mg/dL — ABNORMAL HIGH (ref 6–20)
CO2: 27 mmol/L (ref 22–32)
Calcium: 8.5 mg/dL — ABNORMAL LOW (ref 8.9–10.3)
Chloride: 102 mmol/L (ref 101–111)
Creatinine, Ser: 1.2 mg/dL — ABNORMAL HIGH (ref 0.44–1.00)
GFR calc Af Amer: 55 mL/min — ABNORMAL LOW
GFR calc non Af Amer: 47 mL/min — ABNORMAL LOW
Glucose, Bld: 160 mg/dL — ABNORMAL HIGH (ref 65–99)
Potassium: 3.1 mmol/L — ABNORMAL LOW (ref 3.5–5.1)
Sodium: 139 mmol/L (ref 135–145)

## 2015-02-27 LAB — GLUCOSE, CAPILLARY
GLUCOSE-CAPILLARY: 126 mg/dL — AB (ref 65–99)
Glucose-Capillary: 165 mg/dL — ABNORMAL HIGH (ref 65–99)
Glucose-Capillary: 181 mg/dL — ABNORMAL HIGH (ref 65–99)
Glucose-Capillary: 129 mg/dL — ABNORMAL HIGH (ref 65–99)
Glucose-Capillary: 164 mg/dL — ABNORMAL HIGH (ref 65–99)
Glucose-Capillary: 100 mg/dL — ABNORMAL HIGH (ref 65–99)

## 2015-02-27 LAB — COMPREHENSIVE METABOLIC PANEL
ALT: 53 U/L (ref 14–54)
AST: 39 U/L (ref 15–41)
Albumin: 2.2 g/dL — ABNORMAL LOW (ref 3.5–5.0)
Alkaline Phosphatase: 117 U/L (ref 38–126)
Anion gap: 9 (ref 5–15)
BUN: 33 mg/dL — ABNORMAL HIGH (ref 6–20)
CO2: 27 mmol/L (ref 22–32)
Calcium: 8 mg/dL — ABNORMAL LOW (ref 8.9–10.3)
Chloride: 102 mmol/L (ref 101–111)
Creatinine, Ser: 1.45 mg/dL — ABNORMAL HIGH (ref 0.44–1.00)
GFR calc Af Amer: 44 mL/min — ABNORMAL LOW (ref >60)
GFR calc non Af Amer: 38 mL/min — ABNORMAL LOW (ref >60)
Glucose, Bld: 211 mg/dL — ABNORMAL HIGH (ref 65–99)
Potassium: 3.6 mmol/L (ref 3.5–5.1)
Sodium: 138 mmol/L (ref 135–145)
Total Bilirubin: 2 mg/dL — ABNORMAL HIGH (ref 0.3–1.2)
Total Protein: 6.5 g/dL (ref 6.5–8.1)

## 2015-02-27 LAB — CBC
HCT: 27.8 % — ABNORMAL LOW (ref 36.0–46.0)
Hemoglobin: 9.2 g/dL — ABNORMAL LOW (ref 12.0–15.0)
MCH: 29 pg (ref 26.0–34.0)
MCHC: 33.1 g/dL (ref 30.0–36.0)
MCV: 87.7 fL (ref 78.0–100.0)
Platelets: 270 10*3/uL (ref 150–400)
RBC: 3.17 MIL/uL — ABNORMAL LOW (ref 3.87–5.11)
RDW: 17.4 % — ABNORMAL HIGH (ref 11.5–15.5)
WBC: 5.9 10*3/uL (ref 4.0–10.5)

## 2015-02-27 LAB — CARBOXYHEMOGLOBIN
Carboxyhemoglobin: 1.7 % — ABNORMAL HIGH (ref 0.5–1.5)
Methemoglobin: 0.8 % (ref 0.0–1.5)
O2 Saturation: 78.1 %
Total hemoglobin: 9.8 g/dL — ABNORMAL LOW (ref 12.0–16.0)

## 2015-02-27 NOTE — Progress Notes (Signed)
Pt doesn't tolerate FS well. Pt get anxious, tachy, increase RR, increase peak airway pressure. Once on PS/CPAP pt tolerates it well no complications noted. Pt seems more comfortable and less fighting the vent. RN aware and RN aware of event and currently at bedside with patient. RT will continue to monitor pt throughout the night. O2 sats are stable at this time.

## 2015-02-27 NOTE — Progress Notes (Addendum)
Patient ID: Breanna Alvarez, female   DOB: 1951/04/02, 64 y.o.   MRN: [id number] TCTS DAILY ICU PROGRESS NOTE                   [address]            [place] [id number]          [phone]   19 Days Post-Op Procedure(s) (LRB): STERNAL CLOSURE (N/A) TRANSESOPHAGEAL ECHOCARDIOGRAM (TEE) (N/A)  Total Length of Stay:  LOS: 32 days   Subjective: Opens eyes and follows commands  Objective: Vital signs in last 24 hours: Temp:  [99 F (37.2 C)-101.5 F (38.6 C)] 99.9 F (37.7 C) (10/22 0900) Pulse Rate:  [103-129] 124 (10/22 0900) Cardiac Rhythm:  [-] Sinus tachycardia (10/22 0500) Resp:  [18-36] 36 (10/22 0900) BP: (93-169)/(47-85) 121/59 mmHg (10/22 0800) SpO2:  [95 %-100 %] 99 % (10/22 0900) Arterial Line BP: (121-172)/(46-80) 148/54 mmHg (10/21 1400) FiO2 (%):  [40 %] 40 % (10/22 0850) Weight:  [100 lb 15.5 oz (45.8 kg)] 100 lb 15.5 oz (45.8 kg) (10/22 0538)  Filed Weights   02/25/15 0600 02/26/15 0600 02/27/15 0538  Weight: 105 lb 2.6 oz (47.7 kg) 101 lb 6.6 oz (46 kg) 100 lb 15.5 oz (45.8 kg)    Weight change: -7.1 oz (-0.2 kg)   Hemodynamic parameters for last 24 hours: CVP:  [9 mmHg-14 mmHg] 12 mmHg  Intake/Output from previous day: 10/21 0701 - 10/22 0700 In: 2569.6 [I.V.:1534.6; NG/GT:720; IV Piggyback:225] Out: 2790 [Urine:2390; Stool:400]  Intake/Output this shift: Total I/O In: 230 [I.V.:120; Other:30; NG/GT:80] Out: 250 [Urine:250]  Current Meds: Scheduled Meds: . antiseptic oral rinse  7 mL Mouth Rinse QID  . aspirin EC  325 mg Oral Daily   Or  . aspirin  324 mg Per Tube Daily  . chlorhexidine gluconate  15 mL Mouth Rinse BID  . clonazePAM  1 mg Oral BID  . enoxaparin (LOVENOX) injection  30 mg Subcutaneous Q24H  . feeding supplement (VITAL AF 1.2 CAL)  1,000 mL Per Tube Q24H  . furosemide  40 mg Oral Daily  . glycopyrrolate  1 mg Oral TID  . hydrALAZINE  10 mg Oral 3 times per day  . insulin aspart  0-24 Units Subcutaneous 6  times per day  . lactobacillus acidophilus  2 tablet Oral BID  . metoprolol tartrate  12.5 mg Oral BID  . mupirocin ointment  1 application Topical BID  . neomycin-bacitracin-polymyxin  1 application Topical Daily  . nitroGLYCERIN  0.4 mg Transdermal Daily  . pantoprazole (PROTONIX) IV  40 mg Intravenous Q24H  . QUEtiapine  100 mg Oral QHS  . sodium chloride  10-40 mL Intracatheter Q12H  . sodium chloride  3 mL Intravenous Q12H   Continuous Infusions: . sodium chloride 500 mL (02/27/15 0027)  . dextrose 10 mL/hr at 02/26/15 0700  . dextrose 5 % and 0.45% NaCl 50 mL/hr at 02/26/15 1326   PRN Meds:.Place/Maintain arterial line **AND** sodium chloride, acetaminophen (TYLENOL) oral liquid 160 mg/5 mL, albuterol, fentaNYL (SUBLIMAZE) injection, magic mouthwash, midazolam, ondansetron (ZOFRAN) IV, sodium chloride, sodium chloride    Lab Results: CBC: Recent Labs  02/26/15 0316 02/26/15 1554 02/27/15 0436  WBC 6.8  --  5.9  HGB 9.4* 12.6 9.2*  HCT 30.0* 37.0 27.8*  PLT 304  --  270   BMET:  Recent Labs  02/26/15 0316 02/26/15 1554 02/27/15 0436  NA 140 143 138  K 2.5* 2.9* 3.6  CL 101 104 102  CO2 27  --  27  GLUCOSE 120* 201* 211*  BUN 35* 33* 33*  CREATININE 1.54* 1.40* 1.45*  CALCIUM 8.4*  --  8.0*    PT/INR: No results for input(s): LABPROT, INR in the last 72 hours. Radiology: No results found.   Assessment/Plan: S/P Procedure(s) (LRB): STERNAL CLOSURE (N/A) TRANSESOPHAGEAL ECHOCARDIOGRAM (TEE) (N/A) continue eforts to wean vent On trach collar this am, an most of yesterday    [name] 02/27/2015 10:12 AM

## 2015-02-27 NOTE — Progress Notes (Signed)
Patient ID: Breanna Alvarez, female   DOB: 06-21-1950, 64 y.o.   MRN: [id number] EVENING ROUNDS NOTE :     [address]       [place] [id number]             [phone]                 19 Days Post-Op Procedure(s) (LRB): STERNAL CLOSURE (N/A) TRANSESOPHAGEAL ECHOCARDIOGRAM (TEE) (N/A)  Total Length of Stay:  LOS: 32 days  BP 155/85 mmHg  Pulse 129  Temp(Src) 99.4 F (37.4 C) (Core (Comment))  Resp 36  Ht 5' (1.524 m)  Wt 100 lb 15.5 oz (45.8 kg)  BMI 19.72 kg/m2  SpO2 98%  .Intake/Output      10/22 0701 - 10/23 0700   I.V. (mL/kg) 380 (8.3)   Other 210   NG/GT 440   IV Piggyback    Total Intake(mL/kg) 1030 (22.5)   Urine (mL/kg/hr) 2350 (4.1)   Drains    Stool    Total Output 2350   Net -1320         . sodium chloride 20 mL/hr (02/27/15 1300)  . dextrose 10 mL/hr at 02/26/15 0700  . dextrose 5 % and 0.45% NaCl Stopped (02/27/15 1300)     Lab Results  Component Value Date   WBC 5.9 02/27/2015   HGB 9.2* 02/27/2015   HCT 27.8* 02/27/2015   PLT 270 02/27/2015   GLUCOSE 211* 02/27/2015   CHOL 223* 01/27/2015   TRIG 171* 02/22/2015   HDL 25* 01/27/2015   LDLCALC 179* 01/27/2015   ALT 53 02/27/2015   AST 39 02/27/2015   NA 138 02/27/2015   K 3.6 02/27/2015   CL 102 02/27/2015   CREATININE 1.45* 02/27/2015   BUN 33* 02/27/2015   CO2 27 02/27/2015   TSH 3.572 07/14/2014   INR 1.48 02/24/2015   HGBA1C 6.3* 01/30/2015   Spending more time on trach collar  Delight OvensEdward B Treavor Blomquist MD  Beeper [phone] Office [phone] 02/27/2015 7:28 PM

## 2015-02-27 NOTE — Progress Notes (Signed)
Pt on FS at this time tolerating it well. RN aware. No complications noted.

## 2015-02-27 NOTE — Progress Notes (Signed)
Pt is now on MV. Pt is stable at this time, trach care done per RRT. RT will continue to monitor.

## 2015-02-28 LAB — BASIC METABOLIC PANEL
ANION GAP: 10 (ref 5–15)
BUN: 37 mg/dL — ABNORMAL HIGH (ref 6–20)
CALCIUM: 8.5 mg/dL — AB (ref 8.9–10.3)
CO2: 27 mmol/L (ref 22–32)
Chloride: 104 mmol/L (ref 101–111)
Creatinine, Ser: 1.23 mg/dL — ABNORMAL HIGH (ref 0.44–1.00)
GFR calc non Af Amer: 46 mL/min — ABNORMAL LOW (ref >60)
GFR, EST AFRICAN AMERICAN: 53 mL/min — AB (ref >60)
Glucose, Bld: 131 mg/dL — ABNORMAL HIGH (ref 65–99)
Potassium: 3.6 mmol/L (ref 3.5–5.1)
SODIUM: 141 mmol/L (ref 135–145)

## 2015-02-28 LAB — GLUCOSE, CAPILLARY
GLUCOSE-CAPILLARY: 112 mg/dL — AB (ref 65–99)
GLUCOSE-CAPILLARY: 117 mg/dL — AB (ref 65–99)
GLUCOSE-CAPILLARY: 123 mg/dL — AB (ref 65–99)
GLUCOSE-CAPILLARY: 98 mg/dL (ref 65–99)
Glucose-Capillary: 122 mg/dL — ABNORMAL HIGH (ref 65–99)
Glucose-Capillary: 135 mg/dL — ABNORMAL HIGH (ref 65–99)

## 2015-02-28 LAB — CBC
HCT: 30.5 % — ABNORMAL LOW (ref 36.0–46.0)
Hemoglobin: 9.5 g/dL — ABNORMAL LOW (ref 12.0–15.0)
MCH: 27.6 pg (ref 26.0–34.0)
MCHC: 31.1 g/dL (ref 30.0–36.0)
MCV: 88.7 fL (ref 78.0–100.0)
Platelets: 313 10*3/uL (ref 150–400)
RBC: 3.44 MIL/uL — ABNORMAL LOW (ref 3.87–5.11)
RDW: 17.6 % — ABNORMAL HIGH (ref 11.5–15.5)
WBC: 8 10*3/uL (ref 4.0–10.5)

## 2015-02-28 LAB — COMPREHENSIVE METABOLIC PANEL
ALT: 52 U/L (ref 14–54)
AST: 43 U/L — ABNORMAL HIGH (ref 15–41)
Albumin: 2.1 g/dL — ABNORMAL LOW (ref 3.5–5.0)
Alkaline Phosphatase: 106 U/L (ref 38–126)
Anion gap: 7 (ref 5–15)
BUN: 35 mg/dL — ABNORMAL HIGH (ref 6–20)
CO2: 28 mmol/L (ref 22–32)
Calcium: 8.1 mg/dL — ABNORMAL LOW (ref 8.9–10.3)
Chloride: 105 mmol/L (ref 101–111)
Creatinine, Ser: 1.21 mg/dL — ABNORMAL HIGH (ref 0.44–1.00)
GFR calc Af Amer: 54 mL/min — ABNORMAL LOW (ref >60)
GFR calc non Af Amer: 47 mL/min — ABNORMAL LOW (ref >60)
Glucose, Bld: 129 mg/dL — ABNORMAL HIGH (ref 65–99)
Potassium: 6.7 mmol/L (ref 3.5–5.1)
Sodium: 140 mmol/L (ref 135–145)
Total Bilirubin: 1.7 mg/dL — ABNORMAL HIGH (ref 0.3–1.2)
Total Protein: 6.4 g/dL — ABNORMAL LOW (ref 6.5–8.1)

## 2015-02-28 LAB — CULTURE, BLOOD (ROUTINE X 2)
CULTURE: NO GROWTH
Culture: NO GROWTH

## 2015-02-28 LAB — CARBOXYHEMOGLOBIN
Carboxyhemoglobin: 1.4 % (ref 0.5–1.5)
Methemoglobin: 0.9 % (ref 0.0–1.5)
O2 Saturation: 70.9 %
Total hemoglobin: 10.3 g/dL — ABNORMAL LOW (ref 12.0–16.0)

## 2015-02-28 MED ORDER — POTASSIUM CHLORIDE 10 MEQ/50ML IV SOLN
INTRAVENOUS | Status: AC
  Filled 2015-02-28: qty 50

## 2015-02-28 MED ORDER — POTASSIUM CHLORIDE 10 MEQ/50ML IV SOLN
10.0000 meq | INTRAVENOUS | Status: AC
  Administered 2015-02-28 (×3): 10 meq via INTRAVENOUS
  Filled 2015-02-28 (×2): qty 50

## 2015-02-28 MED ORDER — POTASSIUM CHLORIDE 10 MEQ/50ML IV SOLN
INTRAVENOUS | Status: AC
  Administered 2015-02-28: 10 meq
  Filled 2015-02-28: qty 50

## 2015-02-28 NOTE — Progress Notes (Signed)
eLink Physician-Brief Progress Note Patient Name: Breanna QuarryMaria T Alvarez DOB: [birth year] MRN: [id number]   Date of Service  02/28/2015  HPI/Events of Note  K+ = 3.1 >> 6.7 post replacement. EKG not widened on bedside monitor.   eICU Interventions  Will recheck BMP now.      Intervention Category Major Interventions: Electrolyte abnormality - evaluation and management  Deysy Schabel Eugene 02/28/2015, 6:00 AM

## 2015-02-28 NOTE — Progress Notes (Signed)
PULMONARY / CRITICAL CARE MEDICINE   Name: Breanna Alvarez MRN: [id number] DOB: 1950/05/20    ADMISSION DATE:  01/26/2015 CONSULTATION DATE:  02/09/2105  REFERRING MD :  Donata Clay   CHIEF COMPLAINT:  Vent management   INITIAL PRESENTATION:  64 yo female admitted with chest pain from CAD.  She had CABG 02/01/15 >> post-op complicated by cardiogenic shock s/p centrimag placement and removal, ischemic cardiomyopathy with EF 20%, open sternum.  She remained on vent post-op and PCCM consulted to assist with vent weaning.  STUDIES:  9/21 Echo >> EF 20-25%, mod MR, PA press 51 mmHg 10/15 Echo >> EF 25 to 30%, PAS 36 mmHg  SIGNIFICANT EVENTS: 09/26 CABG x 4, placement of centrimag L ventricular assistive device  09/30 removal centrimag  10/03 sternal closure 10/09 Hg drop overnight by 1 gm and transfusion ordered 10/10 Trach by DF 10/12 Off precedex 10/13 possible aspiration event; TNA started 10/14 difficulty with agitation 10/16 off pressors 10/17 off TNA 10/18 Fever 101, transfuse 1 unit PRBC 10/20 start trach collar trials; off milrinone; G tube placed  SUBJECTIVE: Tolerating trach collar at daytime. On vent at night.  VITAL SIGNS: Temp:  [97.9 F (36.6 C)-100.3 F (37.9 C)] 98.9 F (37.2 C) (10/23 1400) Pulse Rate:  [107-132] 113 (10/23 1400) Resp:  [7-41] 15 (10/23 1400) BP: (82-176)/(46-94) 128/64 mmHg (10/23 1400) SpO2:  [96 %-100 %] 100 % (10/23 1400) FiO2 (%):  [40 %] 40 % (10/23 1400) Weight:  [98 lb 5.2 oz (44.6 kg)] 98 lb 5.2 oz (44.6 kg) (10/23 0500)   HEMODYNAMICS: CVP:  [7 mmHg-9 mmHg] 9 mmHg   VENTILATOR SETTINGS: Vent Mode:  [-] PRVC FiO2 (%):  [40 %] 40 % Set Rate:  [14 bmp] 14 bmp Vt Set:  [370 mL] 370 mL PEEP:  [5 cmH20] 5 cmH20 Pressure Support:  [15 cmH20] 15 cmH20 Plateau Pressure:  [19 cmH20-21 cmH20] 21 cmH20  INTAKE / OUTPUT:  Intake/Output Summary (Last 24 hours) at 02/28/15 1435 Last data filed at 02/28/15 1400  Gross per 24 hour   Intake   1460 ml  Output   2850 ml  Net  -1390 ml   PHYSICAL EXAMINATION: General: Awake, No distress Neuro: No focal deficits HEENT: Trach site clean Cardiovascular: RRR, No MRG Lungs: Clear, No wheeze, crackles. Abdomen: soft, non tender, G tube site clean Musculoskeletal: no edema  LABS: PULMONARY  Recent Labs Lab 02/23/15 0420  02/24/15 0320  02/24/15 1521 02/25/15 0350 02/26/15 0755 02/26/15 0810 02/26/15 0845 02/26/15 1554 02/27/15 0400 02/28/15 0511  PHART 7.390  --  7.392  --   --  7.445  --  7.475* 7.438  --   --   --   PCO2ART 38.3  --  44.4  --   --  37.2  --  37.8 41.9  --   --   --   PO2ART 141*  --  107.0*  --   --  108*  --  104.0* 94.0  --   --   --   HCO3 22.5  --  26.7*  --   --  25.2*  --  27.7* 28.1*  --   --   --   TCO2 23.6  < > 28  --  25 26.3  --  --   --   O2SAT 98.9  < > 98.0  < >  --  72.2  98.3 77.1 98.0 97.0  --  78.1 70.9  < > =  values in this interval not displayed.  CBC  Recent Labs Lab 02/26/15 0316 02/26/15 1554 02/27/15 0436 02/28/15 0457  HGB 9.4* 12.6 9.2* 9.5*  HCT 30.0* 37.0 27.8* 30.5*  WBC 6.8  --  5.9 8.0  PLT 304  --  270 313     CHEMISTRY  Recent Labs Lab 02/22/15 0340  02/26/15 0316 02/26/15 1554 02/27/15 0436 02/27/15 2118 02/28/15 0457 02/28/15 0614  NA 142  < > 140 143 138 139 140 141  K 4.1  < > 2.5* 2.9* 3.6 3.1* 6.7* 3.6  CL 106  < > 101 104 102 102 105 104  CO2 26  < > 27  --  GLUCOSE 133*  < > 120* 201* 211* 160* 129* 131*  BUN 77*  < > 35* 33* 33* 32* 35* 37*  CREATININE 1.64*  < > 1.54* 1.40* 1.45* 1.20* 1.21* 1.23*  CALCIUM 8.5*  < > 8.4*  --  8.0* 8.5* 8.1* 8.5*  MG 2.3  --   --   --   --   --   --   --   PHOS 3.7  --   --   --   --   --   --   --   < > = values in this interval not displayed. Estimated Creatinine Clearance: 33.4 mL/min (by C-G formula based on Cr of 1.23).  LIVER  Recent Labs Lab 02/24/15 0315 02/25/15 0340 02/26/15 0316 02/27/15 0436  02/28/15 0457  AST 33 26 23 39 43*  ALT 88* 67* 47 53 52  ALKPHOS 121 102 101 117 106  BILITOT 3.1* 2.7* 2.4* 2.0* 1.7*  PROT 6.3* 6.4* 6.7 6.5 6.4*  ALBUMIN 1.9* 1.9* 2.4* 2.2* 2.1*  INR 1.48  --   --   --   --    ENDOCRINE CBG (last 3)   Recent Labs  02/28/15 0412 02/28/15 0809 02/28/15 1220  GLUCAP 122* 123* 117*    Iron/TIBC/Ferritin/ %Sat    Component Value Date/Time   IRON 19* 02/22/2015 0900   TIBC 143* 02/22/2015 0900   FERRITIN 1025* 02/22/2015 0900   IRONPCTSAT 13 02/22/2015 0900    IMAGING x48h No results found.  ASSESSMENT / PLAN:  PULMONARY ETT 9/26 >> 10/07 Trach (DF) 10/10 >> A: Acute on chronic respiratory failure 2nd to acute pulmonary edema/cardiogenic shock after CABG, deconditioning, and aspiration pneumonia. Failure to wean from vent s/p tracheostomy. P:   Pressure support wean to trach collar as tolerated Will get speech to assess for PM valve if she can maintain herself off vent Trach care, bronchial hygiene Continue robinul for respiratory secretions Prn albuterol  CARDIOVASCULAR Lt PICC 10/03 >>  Lt brachial aline 10/13 >>  A: CAD s/p CABG.  Acute on chronic systolic heart failure with ischemic CM. Cardiogenic shock. HLD >> reported muscle aches with Crestor. P:  Continue ASA, lopressor, NTG patch Lasix per TCTS and cardiology  RENAL A: AKI in setting of cardiogenic shock >> baseline creatinine 0.93 from 10/01/14 Hypokalemia. P:   Monitor renal fx, urine outpt Replace electrolytes as needed  GASTROINTESTINAL A: Protein calorie malnutrition. Elevated LFT's >> trending down. Dysphagia s/p G tube by IR 10/20 P:   Tube feeds F/u LFTs intermittently Protonix for SUP  HEMATOLOGIC A: Anemia of critical illness. P:  F/u CBC Lovenox for DVT prevention >> on hold for G tube placement  INFECTIOUS A: Aspiration pneumonia 10/13. P:   Zosyn 10/12 >> 10/21.  Got  10 days  Blood 10/18 >>  NGTD  ENDOCRINE A: Hyperglycemia. P:   SSI  NEUROLOGIC A: Agitated delirium. Deconditioning. P:   Continue seroquel, klonopin Fentanyl gtt >> wean off as tolerated if she remains off vent PRN versed RASS goal 0 PT/OT assessment  DISPOSITION: Would be good candidate for LTAC >> not eligible with Medicaid.  Disposition will depend on how well she weans off vent.Chilton Greathouse.  Amadi Yoshino MD [facility] Pager [phone] If no answer or after 3pm call: [phone] 02/28/2015, 2:40 PM

## 2015-02-28 NOTE — Progress Notes (Signed)
Patient ID: Breanna Alvarez, female   DOB: 11/24/1950, 64 y.o.   MRN: [id number] Patient ID: Breanna Alvarez, female   DOB: [birth year], 64 y.o.   MRN: [id number] TCTS DAILY ICU PROGRESS NOTE                   [address]            [place] [id number]          [id number]   20 Days Post-Op Procedure(s) (LRB): STERNAL CLOSURE (N/A) TRANSESOPHAGEAL ECHOCARDIOGRAM (TEE) (N/A)  Total Length of Stay:  LOS: 33 days   Subjective: Opens eyes and follows commands, sitts in chair   Objective: Vital signs in last 24 hours: Temp:  [97.9 F (36.6 C)-100.3 F (37.9 C)] 97.9 F (36.6 C) (10/23 0700) Pulse Rate:  [109-132] 124 (10/23 0815) Cardiac Rhythm:  [-] Sinus tachycardia (10/23 0800) Resp:  [7-36] 23 (10/23 0815) BP: (82-176)/(46-94) 128/74 mmHg (10/23 0800) SpO2:  [95 %-100 %] 100 % (10/23 0815) FiO2 (%):  [40 %] 40 % (10/23 0815) Weight:  [98 lb 5.2 oz (44.6 kg)] 98 lb 5.2 oz (44.6 kg) (10/23 0500)  Filed Weights   02/26/15 0600 02/27/15 0538 02/28/15 0500  Weight: 101 lb 6.6 oz (46 kg) 100 lb 15.5 oz (45.8 kg) 98 lb 5.2 oz (44.6 kg)    Weight change: -2 lb 10.3 oz (-1.2 kg)   Hemodynamic parameters for last 24 hours: CVP:  [7 mmHg-9 mmHg] 7 mmHg  Intake/Output from previous day: 10/22 0701 - 10/23 0700 In: 1690 [I.V.:520; NG/GT:960] Out: 3075 [Urine:3075]  Intake/Output this shift: Total I/O In: 50 [I.V.:10; NG/GT:40] Out: 150 [Urine:150]  Current Meds: Scheduled Meds: . antiseptic oral rinse  7 mL Mouth Rinse QID  . aspirin EC  325 mg Oral Daily   Or  . aspirin  324 mg Per Tube Daily  . chlorhexidine gluconate  15 mL Mouth Rinse BID  . clonazePAM  1 mg Oral BID  . enoxaparin (LOVENOX) injection  30 mg Subcutaneous Q24H  . feeding supplement (VITAL AF 1.2 CAL)  1,000 mL Per Tube Q24H  . furosemide  40 mg Oral Daily  . glycopyrrolate  1 mg Oral TID  . hydrALAZINE  10 mg Oral 3 times per day  . insulin aspart  0-24 Units Subcutaneous 6 times  per day  . lactobacillus acidophilus  2 tablet Oral BID  . metoprolol tartrate  12.5 mg Oral BID  . mupirocin ointment  1 application Topical BID  . neomycin-bacitracin-polymyxin  1 application Topical Daily  . nitroGLYCERIN  0.4 mg Transdermal Daily  . pantoprazole (PROTONIX) IV  40 mg Intravenous Q24H  . QUEtiapine  100 mg Oral QHS  . sodium chloride  10-40 mL Intracatheter Q12H  . sodium chloride  3 mL Intravenous Q12H   Continuous Infusions: . sodium chloride 20 mL/hr at 02/28/15 0800  . dextrose 10 mL/hr at 02/26/15 0700  . dextrose 5 % and 0.45% NaCl Stopped (02/27/15 1300)   PRN Meds:.Place/Maintain arterial line **AND** sodium chloride, acetaminophen (TYLENOL) oral liquid 160 mg/5 mL, albuterol, fentaNYL (SUBLIMAZE) injection, magic mouthwash, midazolam, ondansetron (ZOFRAN) IV, sodium chloride, sodium chloride    Lab Results: CBC:  Recent Labs  02/27/15 0436 02/28/15 0457  WBC 5.9 8.0  HGB 9.2* 9.5*  HCT 27.8* 30.5*  PLT 270 313   BMET:   Recent Labs  02/28/15 0457 02/28/15 0614  NA 140 141  K 6.7* 3.6  CL 105  104  CO2 28 27  GLUCOSE 129* 131*  BUN 35* 37*  CREATININE 1.21* 1.23*  CALCIUM 8.1* 8.5*    PT/INR: No results for input(s): LABPROT, INR in the last 72 hours. Radiology: No results found.   Assessment/Plan: S/P Procedure(s) (LRB): STERNAL CLOSURE (N/A) TRANSESOPHAGEAL ECHOCARDIOGRAM (TEE) (N/A) continue eforts to wean vent as tolerated but easily gets tired and back on vent  On and off trach collar    Delight Ovensdward B Keelyn Monjaras 02/28/2015 9:48 AM

## 2015-02-28 NOTE — Progress Notes (Signed)
Patient ID: Salli QuarryMaria T Kolar, female   DOB: 08-Feb-1951, 64 y.o.   MRN: [id number] EVENING ROUNDS NOTE :     [address]       ,[place] [id number]             [phone]                 20 Days Post-Op Procedure(s) (LRB): STERNAL CLOSURE (N/A) TRANSESOPHAGEAL ECHOCARDIOGRAM (TEE) (N/A)  Total Length of Stay:  LOS: 33 days  BP 167/84 mmHg  Pulse 127  Temp(Src) 98.6 F (37 C) (Oral)  Resp 29  Ht 5' (1.524 m)  Wt 98 lb 5.2 oz (44.6 kg)  BMI 19.20 kg/m2  SpO2 100%  .Intake/Output      10/22 0701 - 10/23 0700 10/23 0701 - 10/24 0700   I.V. (mL/kg) 520 (11.7) 170 (3.8)   Other 210    NG/GT 960 360   IV Piggyback  150   Total Intake(mL/kg) 1690 (37.9) 680 (15.2)   Urine (mL/kg/hr) 3075 (2.9) 1700 (3.6)   Drains     Stool     Total Output 3075 1700   Net -1385 -1020          . sodium chloride 20 mL/hr at 02/28/15 0800  . dextrose 10 mL/hr at 02/26/15 0700  . dextrose 5 % and 0.45% NaCl Stopped (02/27/15 1300)     Lab Results  Component Value Date   WBC 8.0 02/28/2015   HGB 9.5* 02/28/2015   HCT 30.5* 02/28/2015   PLT 313 02/28/2015   GLUCOSE 131* 02/28/2015   CHOL 223* 01/27/2015   TRIG 171* 02/22/2015   HDL 25* 01/27/2015   LDLCALC 179* 01/27/2015   ALT 52 02/28/2015   AST 43* 02/28/2015   NA 141 02/28/2015   K 3.6 02/28/2015   CL 104 02/28/2015   CREATININE 1.23* 02/28/2015   BUN 37* 02/28/2015   CO2 27 02/28/2015   TSH 3.572 07/14/2014   INR 1.48 02/24/2015   HGBA1C 6.3* 01/30/2015   On and off vent   Delight OvensEdward B Satya Buttram MD  Beeper [phone] Office [phone] 02/28/2015 5:44 PM

## 2015-03-01 LAB — GLUCOSE, CAPILLARY
GLUCOSE-CAPILLARY: 151 mg/dL — AB (ref 65–99)
Glucose-Capillary: 103 mg/dL — ABNORMAL HIGH (ref 65–99)
Glucose-Capillary: 142 mg/dL — ABNORMAL HIGH (ref 65–99)
Glucose-Capillary: 147 mg/dL — ABNORMAL HIGH (ref 65–99)
Glucose-Capillary: 201 mg/dL — ABNORMAL HIGH (ref 65–99)
Glucose-Capillary: 94 mg/dL (ref 65–99)

## 2015-03-01 LAB — CBC
HEMATOCRIT: 31.5 % — AB (ref 36.0–46.0)
HEMOGLOBIN: 9.8 g/dL — AB (ref 12.0–15.0)
MCH: 27.6 pg (ref 26.0–34.0)
MCHC: 31.1 g/dL (ref 30.0–36.0)
MCV: 88.7 fL (ref 78.0–100.0)
Platelets: 320 10*3/uL (ref 150–400)
RBC: 3.55 MIL/uL — AB (ref 3.87–5.11)
RDW: 17.6 % — ABNORMAL HIGH (ref 11.5–15.5)
WBC: 8.5 10*3/uL (ref 4.0–10.5)

## 2015-03-01 LAB — APTT: APTT: 31 s (ref 24–37)

## 2015-03-01 LAB — PROTIME-INR
INR: 1.31 (ref 0.00–1.49)
PROTHROMBIN TIME: 16.4 s — AB (ref 11.6–15.2)

## 2015-03-01 MED ORDER — VITAL AF 1.2 CAL PO LIQD
1000.0000 mL | ORAL | Status: DC
  Administered 2015-03-01 – 2015-03-08 (×9): 1000 mL
  Filled 2015-03-01 (×11): qty 1000

## 2015-03-01 MED ORDER — VITAL AF 1.2 CAL PO LIQD
1000.0000 mL | ORAL | Status: DC
Start: 2015-03-02 — End: 2015-03-01
  Filled 2015-03-01: qty 1000

## 2015-03-01 NOTE — Progress Notes (Signed)
PULMONARY / CRITICAL CARE MEDICINE   Name: Breanna Alvarez MRN: [id number] DOB: June 24, 1950    ADMISSION DATE:  01/26/2015 CONSULTATION DATE:  02/09/2105  REFERRING MD :  Donata Clay   CHIEF COMPLAINT:  Vent management   INITIAL PRESENTATION:  64 yo female admitted with chest pain from CAD.  She had CABG 02/01/15 >> post-op complicated by cardiogenic shock s/p centrimag placement and removal, ischemic cardiomyopathy with EF 20%, open sternum.  She remained on vent post-op and PCCM consulted to assist with vent weaning.  STUDIES:  9/21 Echo >> EF 20-25%, mod MR, PA press 51 mmHg 10/15 Echo >> EF 25 to 30%, PAS 36 mmHg  SIGNIFICANT EVENTS: 09/26 CABG x 4, placement of centrimag L ventricular assistive device  09/30 removal centrimag  10/03 sternal closure 10/09 Hg drop overnight by 1 gm and transfusion ordered 10/10 Trach by DF 10/12 Off precedex 10/13 possible aspiration event; TNA started 10/14 difficulty with agitation 10/16 off pressors 10/17 off TNA 10/18 Fever 101, transfuse 1 unit PRBC 10/20 start trach collar trials; off milrinone; G tube placed 02/28/15 ;Tolerating trach collar at daytime. On vent at night.   SUBJECTIVE/OVERNIGHT/INTERVAL HX 03/01/15 :atc x daytime. Sitting in chair. RN who had her before weekend reports no issues. More alert. Nods to simple Qs  VITAL SIGNS: Temp:  [98.6 F (37 C)-100 F (37.8 C)] 99 F (37.2 C) (10/24 0900) Pulse Rate:  [107-128] 122 (10/24 0900) Resp:  [0-41] 12 (10/24 0900) BP: (90-167)/(46-91) 125/61 mmHg (10/24 0900) SpO2:  [96 %-100 %] 96 % (10/24 0900) FiO2 (%):  [28 %-40 %] 28 % (10/24 0746) Weight:  [40.2 kg (88 lb 10 oz)] 40.2 kg (88 lb 10 oz) (10/24 0547)   HEMODYNAMICS: CVP:  [5 mmHg-11 mmHg] 5 mmHg   VENTILATOR SETTINGS: Vent Mode:  [-] PRVC FiO2 (%):  [28 %-40 %] 28 % Set Rate:  [14 bmp] 14 bmp Vt Set:  [370 mL] 370 mL PEEP:  [5 cmH20] 5 cmH20 Plateau Pressure:  [19 cmH20-21 cmH20] 19 cmH20  INTAKE /  OUTPUT:  Intake/Output Summary (Last 24 hours) at 03/01/15 1006 Last data filed at 03/01/15 0900  Gross per 24 hour  Intake   2020 ml  Output   3255 ml  Net  -1235 ml   PHYSICAL EXAMINATION: General: Awake, No distress. Deconditioned but improved compared to 2 weeks ago when I saw her last Neuro: No focal deficits HEENT: Trach site clean Cardiovascular: RRR, No MRG Lungs: Clear, No wheeze, crackles. Abdomen: soft, non tender, G tube site clean Musculoskeletal: no edema  LABS: PULMONARY  Recent Labs Lab 02/23/15 0420  02/24/15 0320  02/24/15 1521 02/25/15 0350 02/26/15 0755 02/26/15 0810 02/26/15 0845 02/26/15 1554 02/27/15 0400 02/28/15 0511  PHART 7.390  --  7.392  --   --  7.445  --  7.475* 7.438  --   --   --   PCO2ART 38.3  --  44.4  --   --  37.2  --  37.8 41.9  --   --   --   PO2ART 141*  --  107.0*  --   --  108*  --  104.0* 94.0  --   --   --   HCO3 22.5  --  26.7*  --   --  25.2*  --  27.7* 28.1*  --   --   --   TCO2 23.6  < > 28  --  25 26.3  --  --   --  O2SAT 98.9  < > 98.0  < >  --  72.2  98.3 77.1 98.0 97.0  --  78.1 70.9  < > = values in this interval not displayed.  CBC  Recent Labs Lab 02/27/15 0436 02/28/15 0457 03/01/15 0352  HGB 9.2* 9.5* 9.8*  HCT 27.8* 30.5* 31.5*  WBC 5.9 8.0 8.5  PLT 270 313 320     CHEMISTRY  Recent Labs Lab 02/26/15 0316 02/26/15 1554 02/27/15 0436 02/27/15 2118 02/28/15 0457 02/28/15 0614  NA 140 143 138 139 140 141  K 2.5* 2.9* 3.6 3.1* 6.7* 3.6  CL 101 104 102 102 105 104  CO2 27  --  GLUCOSE 120* 201* 211* 160* 129* 131*  BUN 35* 33* 33* 32* 35* 37*  CREATININE 1.54* 1.40* 1.45* 1.20* 1.21* 1.23*  CALCIUM 8.4*  --  8.0* 8.5* 8.1* 8.5*   Estimated Creatinine Clearance: 30.1 mL/min (by C-G formula based on Cr of 1.23).  LIVER  Recent Labs Lab 02/24/15 0315 02/25/15 0340 02/26/15 0316 02/27/15 0436 02/28/15 0457 03/01/15 0815  AST 33 26 23 39 43*  --   ALT 88* 67*  47 53 52  --   ALKPHOS 121 102 101 117 106  --   BILITOT 3.1* 2.7* 2.4* 2.0* 1.7*  --   PROT 6.3* 6.4* 6.7 6.5 6.4*  --   ALBUMIN 1.9* 1.9* 2.4* 2.2* 2.1*  --   INR 1.48  --   --   --   --  1.31   ENDOCRINE CBG (last 3)   Recent Labs  03/01/15 0019 03/01/15 0409 03/01/15 0801  GLUCAP 147* 151* 103*    Iron/TIBC/Ferritin/ %Sat    Component Value Date/Time   IRON 19* 02/22/2015 0900   TIBC 143* 02/22/2015 0900   FERRITIN 1025* 02/22/2015 0900   IRONPCTSAT 13 02/22/2015 0900    IMAGING x48h No results found.  ASSESSMENT / PLAN:  PULMONARY ETT 9/26 >> 10/07 Trach (DF) 10/10 >> A: Acute on chronic respiratory failure 2nd to acute pulmonary edema/cardiogenic shock after CABG, deconditioning, and aspiration pneumonia. Failure to wean from vent s/p tracheostomy.   - slowly bettter  P:   Pressure support wean to trach collar as tolerated Will get speech to assess for PM valve if she can maintain herself off vent Trach care, bronchial hygiene Continue robinul for respiratory secretions Prn albuterol  CARDIOVASCULAR Lt PICC 10/03 >>  Lt brachial aline 10/13 >>  A: CAD s/p CABG.  Acute on chronic systolic heart failure with ischemic CM. Cardiogenic shock. HLD >> reported muscle aches with Crestor.   - off milrinone  P:  Continue ASA, lopressor, NTG patch Lasix per TCTS and cardiology  RENAL A: AKI in setting of cardiogenic shock >> baseline creatinine 0.93 from 10/01/14 Hypokalemia.   - improving P:   Monitor renal fx, urine outpt Replace electrolytes as needed  GASTROINTESTINAL A: Protein calorie malnutrition. Elevated LFT's >> trending down. Dysphagia s/p G tube by IR 10/20 P:   Tube feeds F/u LFTs intermittently Protonix for SUP  HEMATOLOGIC A: Anemia of critical illness. P:  F/u CBC Lovenox for DVT prevention >> on hold for G tube placement  INFECTIOUS A: Aspiration pneumonia 10/13. P:   Zosyn 10/12 >> 10/21.  Got 10 days  Blood  10/18 >> NGTD  ENDOCRINE A: Hyperglycemia. P:   SSI  NEUROLOGIC A: Agitated delirium. Deconditioning.   - slowly improving P:   Continue seroquel, klonopin Fentanyl gtt >> wean  off as tolerated if she remains off vent PRN versed RASS goal 0 PT/OT assessment  DISPOSITION: Would be good candidate for LTAC >> not eligible with Medicaid.  Disposition will depend on how well she weans off vent..   Dr. Kalman ShanMurali Macon Sandiford, M.D., F.C.C.P Pulmonary and Critical Care Medicine Staff Physician [facility] Pager: [phone], If no answer or between  15:00h - 7:00h: call [phone]  03/01/2015 10:12 AM

## 2015-03-01 NOTE — Progress Notes (Signed)
Patient placed on 28% trach collar.  Currently tolerating well.  Will continue to monitor.  

## 2015-03-01 NOTE — Evaluation (Signed)
Occupational Therapy Evaluation Patient Details Name: Breanna Alvarez MRN: [id number] DOB: 04/24/1951 Today's Date: 03/01/2015    History of Present Illness Breanna Alvarez is a 64 y.o. female who has PMHx of CAD, CHF and had an NSTEMI in ealy 2016, present to ED with ongoing CP.  She is now s/p CABG as of 9/27 with Centrimag placement, 9/30 Centrimag removal, 10/3 sternal closure, Wound I and D 10/4, failed extubation 10/7, 10/10 trached, ?aspiration on 10/13 and worsening status.     First attempt wean to [facility]. 10/19.   Clinical Impression   Pt s/p above. Unsure of pt's PLOF as no family present. Recommending SNF for d/c. Feel pt will benefit from acute OT to increase independence, strength, and activity tolerance prior to d/c.     Follow Up Recommendations  SNF    Equipment Recommendations  Other (comment) (defer to next venue)    Recommendations for Other Services       Precautions / Restrictions Precautions Precautions: Fall;Sternal Restrictions Weight Bearing Restrictions: Yes (sternal precautions)      Mobility Bed Mobility               General bed mobility comments: not assessed- pt in chair  Transfers Overall transfer level: Needs assistance   Transfers: Sit to/from Stand Sit to Stand: Max assist;+2 physical assistance         General transfer comment: cues for technique.    Balance    Pt with left lateral lean when sitting- assist for balance/better alignment when back not supported. +2 assist for support for sit to stand transfer.                                        ADL Overall ADL's : Needs assistance/impaired Eating/Feeding: NPO                   Lower Body Dressing: +2 for physical assistance;Maximal assistance;Sit to/from stand   Toilet Transfer: Maximal assistance;+2 for physical assistance (sit to stand from chair)   Toileting- Clothing Manipulation and Hygiene: Sit to/from stand;Total assistance; +2 physical  assistance          General ADL Comments: Pt able to use suction in session and also talked some with passy-muir valve. Stood and nurse assisted in washing off pt's bottom from stool.     Vision     Perception     Praxis      Pertinent Vitals/Pain Pain Assessment: No/denies pain; HR in 120s during session.     Hand Dominance     Extremity/Trunk Assessment Upper Extremity Assessment Upper Extremity Assessment:  (unable to touch top of her head-unsure strength vs cognition)   Lower Extremity Assessment Lower Extremity Assessment: Defer to PT evaluation       Communication Communication Communication: Tracheostomy;Passy-Muir valve (used passy-muir valve some in session)   Cognition Arousal/Alertness: Lethargic Behavior During Therapy: WFL for tasks assessed/performed Overall Cognitive Status: Impaired/Different from baseline Area of Impairment: Following commands;Attention       Following Commands: Follows one step commands inconsistently           General Comments       Exercises Exercises: Other exercises Other Exercises Other Exercises: leg kicks   Shoulder Instructions      Home Living Family/patient expects to be discharged to:: Private residence Living Arrangements: Spouse/significant other  Additional Comments: No family available to get home or PLOF info.      Prior Functioning/Environment          Comments: unsure of PLOF    OT Diagnosis: Generalized weakness   OT Problem List: Decreased cognition;Decreased knowledge of use of DME or AE;Decreased knowledge of precautions;Decreased strength;Decreased activity tolerance;Impaired balance (sitting and/or standing)   OT Treatment/Interventions: Self-care/ADL training;DME and/or AE instruction;Therapeutic activities;Patient/family education;Balance training;Cognitive remediation/compensation;Therapeutic exercise    OT Goals(Current goals can be  found in the care plan section) Acute Rehab OT Goals Patient Stated Goal: not stated OT Goal Formulation: Patient unable to participate in goal setting Time For Goal Achievement: 03/15/15 Potential to Achieve Goals: Good ADL Goals Pt Will Perform Grooming: with set-up;with supervision;sitting Pt Will Perform Upper Body Bathing: sitting;with set-up;with supervision Pt Will Perform Upper Body Dressing: with min assist;sitting Pt Will Transfer to Toilet: stand pivot transfer;with mod assist;with +2 assist;bedside commode  OT Frequency: Min 2X/week   Barriers to D/C:            Co-evaluation              End of Session Equipment Utilized During Treatment: Oxygen Nurse Communication: Other (comment) (assisted in session)  Activity Tolerance:  (seemed tired in session-closing eyes at times) Patient left: in chair;with call bell/phone within reach   Time: [id number] OT Time Calculation (min): 25 min Charges:  OT General Charges $OT Visit: 1 Procedure OT Evaluation $Initial OT Evaluation Tier I: 1 Procedure G-CodesEarlie Raveling:    Corby Vandenberghe L OTR/L [phone] 03/01/2015, 4:41 PM

## 2015-03-01 NOTE — Evaluation (Signed)
Passy-Muir Speaking Valve - Evaluation Patient Details  Name: Breanna Alvarez MRN: [id number] Date of Birth: 1950-11-25  Today's Date: 03/01/2015 Time: 1000-1025 SLP Time Calculation (min) (ACUTE ONLY): 25 min  Past Medical History:  Past Medical History  Diagnosis Date  . Hyperlipidemia   . Acute systolic CHF (congestive heart failure), NYHA class 3 (HCC) 07/10/2014  . Hypertension    Past Surgical History:  Past Surgical History  Procedure Laterality Date  . No past surgeries    . Left heart catheterization with coronary angiogram N/A 07/13/2014    Procedure: LEFT HEART CATHETERIZATION WITH CORONARY ANGIOGRAM;  Surgeon: Corky Crafts, MD; mLAD 95%, dLAD 95%, D1 95%, CFX severe dz, mRCA 80%  . Coronary artery bypass graft N/A 02/01/2015    Procedure: CORONARY ARTERY BYPASS GRAFTING (CABG) X 4 UTILIZING THE LEFT INTERNAL MAMMARY ARTERY TO LAD, ENDOSCOPICALLY HARVESTED BILATERAL SAPHENEOUS VEIN GRAFTS  TO DIAGONAL, OM AND PD.;  Surgeon: Kerin Perna, MD;  Location: [facility] OR;  Service: Open Heart Surgery;  Laterality: N/A;  . Tee without cardioversion N/A 02/01/2015    Procedure: TRANSESOPHAGEAL ECHOCARDIOGRAM (TEE);  Surgeon: Kerin Perna, MD;  Location: [facility] OR;  Service: Open Heart Surgery;  Laterality: N/A;  . Placement of centrimag ventricular assist device N/A 02/01/2015    Procedure: PLACEMENT OF CENTRIMAG VENTRICULAR ASSIST DEVICE;  Surgeon: Kerin Perna, MD;  Location: [facility] OR;  Service: Open Heart Surgery;  Laterality: N/A;  . Removal of centrimag ventricular assist device N/A 02/05/2015    Procedure: REMOVAL OF CENTRIMAG VENTRICULAR ASSIST DEVICE;  Surgeon: Kerin Perna, MD;  Location: [facility] OR;  Service: Open Heart Surgery;  Laterality: N/A;  . Cannulation for cardiopulmonary bypass N/A 02/05/2015    Procedure: CANNULATION FOR CARDIOPULMONARY BYPASS;  Surgeon: Kerin Perna, MD;  Location: [facility] OR;  Service: Open Heart Surgery;  Laterality: N/A;  . Tee without cardioversion  N/A 02/05/2015    Procedure: TRANSESOPHAGEAL ECHOCARDIOGRAM (TEE);  Surgeon: Kerin Perna, MD;  Location: [facility] OR;  Service: Open Heart Surgery;  Laterality: N/A;  . Sternal closure N/A 02/08/2015    Procedure: STERNAL CLOSURE;  Surgeon: Kerin Perna, MD;  Location: [facility] OR;  Service: Thoracic;  Laterality: N/A;  . Tee without cardioversion N/A 02/08/2015    Procedure: TRANSESOPHAGEAL ECHOCARDIOGRAM (TEE);  Surgeon: Kerin Perna, MD;  Location: [facility] OR;  Service: Thoracic;  Laterality: N/A;   HPI:  64 yo female never smoker with hx HTN, CAD previously refused CABG ultimately admitted 9/20 for CVTS eval and CABG after ongoing chest pain. On 02/01/15 she had a CORONARY ARTERY BYPASS GRAFTING (CABG), MITRAL VALVE REPAIR (MVR), and TRANSESOPHAGEAL ECHOCARDIOGRAM (TEE) and has had complicated course with cardiogenic shock s/p centrimag placement (temporary LVAD) and removal (on 9/30), ischemic cardiomyopathy with EF 20%, open sternum now s/p sternal closure 10/3. She remained intubated since initial CABG 9/26 with failure to wean, she was trached (6mm Shiley) on 02/15/15, trach collar trials started. She had a possible aspiration event from tube feeds on 10/13, PEG placed 10/19.    Assessment / Plan / Recommendation Clinical Impression  During PMSV evaluation, pt was on trach collar, she tolerated cuff deflation for 20 minutes, and PMSV placement for about 15 minutes. Throughout the trial pt's respiratory rate was around 26, her oxygen remained at 100, and heart rate was around 110. Pt did not have any tracheal secretions and minimal oral secretions were suctioned. Pt had good redirection of air through upper airway, no air trapping,  her vocal intensity was low and vocal quality was hoarse, not abnormal post prolonged intubation. Pt counted to three with maximal verbal cuing and modeling. SLP provided max-moderate verbal cues throughout trial to take a deep breath to increase volume, pt said one to two word  phrases intelligibly. SLP left cuff deflated, educated RN on placement and to check for cuff deflation. SLP recommends pt wear PMSV with full staff supervision. Will f/u with further PMSV treatment.     SLP Assessment  Patient needs continued Speech Lanaguage Pathology Services    Follow Up Recommendations       Frequency and Duration min 3x week  2 weeks   Pertinent Vitals/Pain NA    SLP Goals Potential to Achieve Goals (ACUTE ONLY): Good Potential Considerations (ACUTE ONLY): Severity of impairments   PMSV Trial  PMSV was placed for: 15 Able to redirect subglottic air through upper airway: Yes Able to Attain Phonation: Yes Voice Quality: Hoarse;Low vocal intensity Able to Expectorate Secretions: Yes Level of Secretion Expectoration with PMSV: Oral Breath Support for Phonation: Mildly decreased Intelligibility: Intelligible Respirations During Trial: 26 SpO2 During Trial: 100 % Pulse During Trial: 110 Behavior: Alert;Cooperative;Responsive to questions   Tracheostomy Tube  Additional Tracheostomy Tube Assessment Level of Secretion Expectoration: Oral    Vent Dependency  Vent Dependent: No FiO2 (%): 28 %    Cuff Deflation Trial Tolerated Cuff Deflation: Yes Length of Time for Cuff Deflation Trial: 20 Behavior: Alert;Cooperative;Quiet   Riccardo DubinKristen Firmin [place], Student-SLP  Riccardo DubinKristen Makiyah Zentz 03/01/2015, 11:24 AM

## 2015-03-01 NOTE — Progress Notes (Signed)
Sputum specimen obtained and sent down to main lab without complications.  

## 2015-03-01 NOTE — Progress Notes (Signed)
RN stated that she just suctioned pt prior to my arrival of assessment. Pt is stable at this time no distress or complications noted.

## 2015-03-01 NOTE — Progress Notes (Signed)
21 Days Post-Op Procedure(s) (LRB): STERNAL CLOSURE (N/A) TRANSESOPHAGEAL ECHOCARDIOGRAM (TEE) (N/A) Subjective: 21 Days Post-Op Procedure(s) (LRB): STERNAL CLOSURE (N/A) TRANSESOPHAGEAL ECHOCARDIOGRAM (TEE) (N/A) Subjective:  CABG-LVAD for CAD, EF .15 preop  COPD , aspiration from TF and swallow dysfunction Trach , OOB for PS weans Off antibiotics PEG placed- no residuals Now tolerating trach collar Too weak to stand- PT seeing patient Milrinone weaned off- nsr, last echo ef .35 Objective: Vital signs in last 24 hours: Temp:  [98.6 F (37 C)-100 F (37.8 C)] 99 F (37.2 C) (10/24 0900) Pulse Rate:  [107-128] 122 (10/24 0900) Cardiac Rhythm:  [-] Sinus tachycardia (10/24 0900) Resp:  [0-41] 12 (10/24 0900) BP: (90-167)/(46-91) 125/61 mmHg (10/24 0900) SpO2:  [96 %-100 %] 96 % (10/24 0900) FiO2 (%):  [28 %-40 %] 28 % (10/24 0746) Weight:  [88 lb 10 oz (40.2 kg)] 88 lb 10 oz (40.2 kg) (10/24 0547)  Hemodynamic parameters for last 24 hours: CVP:  [5 mmHg-11 mmHg] 5 mmHg  Intake/Output from previous day: 10/23 0701 - 10/24 0700 In: 1940 [I.V.:880; NG/GT:880; IV Piggyback:150] Out: 3305 [Urine:3005; Stool:300] Intake/Output this shift: Total I/O In: 250 [I.V.:140; Other:30; NG/GT:80] Out: 250 [Urine:150; Stool:100]    OOB to chair Neuro intact Incisions clean Lungs clear Lab Results:  Recent Labs  02/28/15 0457 03/01/15 0352  WBC 8.0 8.5  HGB 9.5* 9.8*  HCT 30.5* 31.5*  PLT 313 320   BMET:   Recent Labs  02/28/15 0457 02/28/15 0614  NA 140 141  K 6.7* 3.6  CL 105 104  CO2 28 27  GLUCOSE 129* 131*  BUN 35* 37*  CREATININE 1.21* 1.23*  CALCIUM 8.1* 8.5*    PT/INR:   Recent Labs  03/01/15 0815  LABPROT 16.4*  INR 1.31   ABG    Component Value Date/Time   PHART 7.438 02/26/2015 0845   HCO3 28.1* 02/26/2015 0845   TCO2 24 02/26/2015 1554   ACIDBASEDEF 1.6 02/23/2015 0420   O2SAT 70.9 02/28/2015 0511   CBG (last 3)   Recent Labs  03/01/15 0019 03/01/15 0409 03/01/15 0801  GLUCAP 147* 151* 103*    Assessment/Plan: S/P Procedure(s) (LRB): STERNAL CLOSURE (N/A) TRANSESOPHAGEAL ECHOCARDIOGRAM (TEE) (N/A) Mobilize Diuresis Pressure support weaning  Trach collar weaning- swallow study pending   LOS: 34 days    Kathlee Nationseter Van Trigt III 03/01/2015

## 2015-03-01 NOTE — Progress Notes (Signed)
TCTS BRIEF SICU PROGRESS NOTE  21 Days Post-Op  S/P Procedure(s) (LRB): STERNAL CLOSURE (N/A) TRANSESOPHAGEAL ECHOCARDIOGRAM (TEE) (N/A)   Stable day  Plan: Continue current plan  Purcell Nailslarence H Owen, MD 03/01/2015 6:38 PM

## 2015-03-01 NOTE — Evaluation (Signed)
Clinical/Bedside Swallow Evaluation Patient Details  Name: Breanna Alvarez MRN: [id number] Date of Birth: June 26, 1950  Today's Date: 03/01/2015 Time: SLP Start Time (ACUTE ONLY): 1000 SLP Stop Time (ACUTE ONLY): 1025 SLP Time Calculation (min) (ACUTE ONLY): 25 min  Past Medical History:  Past Medical History  Diagnosis Date  . Hyperlipidemia   . Acute systolic CHF (congestive heart failure), NYHA class 3 (HCC) 07/10/2014  . Hypertension    Past Surgical History:  Past Surgical History  Procedure Laterality Date  . No past surgeries    . Left heart catheterization with coronary angiogram N/A 07/13/2014    Procedure: LEFT HEART CATHETERIZATION WITH CORONARY ANGIOGRAM;  Surgeon: Corky CraftsJayadeep S Varanasi, MD; mLAD 95%, dLAD 95%, D1 95%, CFX severe dz, mRCA 80%  . Coronary artery bypass graft N/A 02/01/2015    Procedure: CORONARY ARTERY BYPASS GRAFTING (CABG) X 4 UTILIZING THE LEFT INTERNAL MAMMARY ARTERY TO LAD, ENDOSCOPICALLY HARVESTED BILATERAL SAPHENEOUS VEIN GRAFTS  TO DIAGONAL, OM AND PD.;  Surgeon: Kerin PernaPeter Van Trigt, MD;  Location: [facility] OR;  Service: Open Heart Surgery;  Laterality: N/A;  . Tee without cardioversion N/A 02/01/2015    Procedure: TRANSESOPHAGEAL ECHOCARDIOGRAM (TEE);  Surgeon: Kerin PernaPeter Van Trigt, MD;  [facility];  Service: Open Heart Surgery;  Laterality: N/A;  . Placement of centrimag ventricular assist device N/A 02/01/2015    Procedure: PLACEMENT OF CENTRIMAG VENTRICULAR ASSIST DEVICE;  Surgeon: Kerin PernaPeter Van Trigt, MD;  [facility];  Service: Open Heart Surgery;  Laterality: N/A;  . Removal of centrimag ventricular assist device N/A 02/05/2015    Procedure: REMOVAL OF CENTRIMAG VENTRICULAR ASSIST DEVICE;  Surgeon: Kerin PernaPeter Van Trigt, MD;  [facility];  Service: Open Heart Surgery;  Laterality: N/A;  . Cannulation for cardiopulmonary bypass N/A 02/05/2015    Procedure: CANNULATION FOR CARDIOPULMONARY BYPASS;  Surgeon: Kerin PernaPeter Van Trigt, MD;  [facility];  Service: Open Heart  Surgery;  Laterality: N/A;  . Tee without cardioversion N/A 02/05/2015    Procedure: TRANSESOPHAGEAL ECHOCARDIOGRAM (TEE);  Surgeon: Kerin PernaPeter Van Trigt, MD;  [facility];  Service: Open Heart Surgery;  Laterality: N/A;  . Sternal closure N/A 02/08/2015    Procedure: STERNAL CLOSURE;  Surgeon: Kerin PernaPeter Van Trigt, MD;  [facility];  Service: Thoracic;  Laterality: N/A;  . Tee without cardioversion N/A 02/08/2015    Procedure: TRANSESOPHAGEAL ECHOCARDIOGRAM (TEE);  Surgeon: Kerin PernaPeter Van Trigt, MD;  [facility];  Service: Thoracic;  Laterality: N/A;   HPI:  64 yo female never smoker with hx HTN, CAD previously refused CABG ultimately admitted 9/20 for CVTS eval and CABG after ongoing chest pain. On 02/01/15 she had a CORONARY ARTERY BYPASS GRAFTING (CABG), MITRAL VALVE REPAIR (MVR), and TRANSESOPHAGEAL ECHOCARDIOGRAM (TEE) and has had complicated course with cardiogenic shock s/p centrimag placement (temporary LVAD) and removal (on 9/30), ischemic cardiomyopathy with EF 20%, open sternum now s/p sternal closure 10/3. She remained intubated since initial CABG 9/26 with failure to wean, she was trached (6mm Shiley) on 02/15/15, trach collar trials started. She had a possible aspiration event from tube feeds on 10/13, PEG placed 10/19.    Assessment / Plan / Recommendation Clinical Impression  During BSE, pt tolerated PMSV. Pt presents with moderate oral and oropharyngeal dysphagia with overt s/s of aspiration (immediate cough with thin liquid). Pt had a prolonged oral phase characterized by oral holding, lingual pumping, and delayed anterior to posterior transit, likely due poor mentation (e.g. poor attention and difficulty following auditory commands). When palpating, pt's swallow felt  weak with decreased hyoid-laryngeal movement. SLP provided verbal and tactile cues for small sips, clear throat, and swallow secretions. SLP recommends pt stay NPO, due to high risk of silent aspiration following prolonged  intubation. SLP will follow up with po trials.     Aspiration Risk  Moderate    Diet Recommendation NPO   Medication Administration: Via alternative means    Other  Recommendations Oral Care Recommendations: Oral care QID   Follow Up Recommendations       Frequency and Duration min 3x week  2 weeks   Pertinent Vitals/Pain NA    SLP Swallow Goals     Swallow Study Prior Functional Status       General Other Pertinent Information: 64 yo female never smoker with hx HTN, CAD previously refused CABG ultimately admitted 9/20 for CVTS eval and CABG after ongoing chest pain. On 02/01/15 she had a CORONARY ARTERY BYPASS GRAFTING (CABG), MITRAL VALVE REPAIR (MVR), and TRANSESOPHAGEAL ECHOCARDIOGRAM (TEE) and has had complicated course with cardiogenic shock s/p centrimag placement (temporary LVAD) and removal (on 9/30), ischemic cardiomyopathy with EF 20%, open sternum now s/p sternal closure 10/3. She remained intubated since initial CABG 9/26 with failure to wean, she was trached (6mm Shiley) on 02/15/15, trach collar trials started. She had a possible aspiration event from tube feeds on 10/13, PEG placed 10/19.  Type of Study: Bedside swallow evaluation Diet Prior to this Study: NPO Temperature Spikes Noted: No Respiratory Status: Trach Trach Size and Type: #6;Cuff;Deflated;With PMSV in place History of Recent Intubation: Yes Length of Intubations (days): 11 days Date extubated: 02/12/15 Behavior/Cognition: Alert;Cooperative;Pleasant mood Oral Cavity - Dentition: Missing dentition;Poor condition Self-Feeding Abilities: Able to feed self;Needs assist;Needs set up Patient Positioning: Upright in chair/Tumbleform Baseline Vocal Quality: Hoarse;Low vocal intensity Volitional Cough: Congested Volitional Swallow: Able to elicit    Oral/Motor/Sensory Function Overall Oral Motor/Sensory Function: Appears within functional limits for tasks assessed Labial ROM: Within Functional  Limits Labial Symmetry: Within Functional Limits Labial Strength: Within Functional Limits Lingual ROM: Within Functional Limits Lingual Symmetry: Within Functional Limits Lingual Strength: Within Functional Limits Facial ROM: Within Functional Limits Facial Symmetry: Within Functional Limits   Ice Chips Ice chips: Impaired Presentation: Spoon Oral Phase Impairments: Impaired anterior to posterior transit Oral Phase Functional Implications: Oral holding   Thin Liquid Thin Liquid: Impaired Presentation: Cup Oral Phase Impairments: Reduced labial seal Oral Phase Functional Implications: Oral holding;Prolonged oral transit Pharyngeal  Phase Impairments: Decreased hyoid-laryngeal movement;Suspected delayed Swallow    Nectar Thick Nectar Thick Liquid: Not tested   Honey Thick Honey Thick Liquid: Not tested   Puree Puree: Impaired Presentation: Spoon Oral Phase Functional Implications: Oral holding;Prolonged oral transit Pharyngeal Phase Impairments: Decreased hyoid-laryngeal movement   Solid   GO    Riccardo Dubin, Student-SLP Solid: Not tested       Riccardo Dubin 03/01/2015,11:34 AM

## 2015-03-02 ENCOUNTER — Inpatient Hospital Stay (HOSPITAL_COMMUNITY): Payer: Medicaid Other

## 2015-03-02 LAB — COMPREHENSIVE METABOLIC PANEL
ALT: 55 U/L — ABNORMAL HIGH (ref 14–54)
AST: 37 U/L (ref 15–41)
Albumin: 2.3 g/dL — ABNORMAL LOW (ref 3.5–5.0)
Alkaline Phosphatase: 113 U/L (ref 38–126)
Anion gap: 10 (ref 5–15)
BUN: 39 mg/dL — ABNORMAL HIGH (ref 6–20)
CO2: 26 mmol/L (ref 22–32)
Calcium: 8.5 mg/dL — ABNORMAL LOW (ref 8.9–10.3)
Chloride: 106 mmol/L (ref 101–111)
Creatinine, Ser: 1.08 mg/dL — ABNORMAL HIGH (ref 0.44–1.00)
GFR calc Af Amer: >60 mL/min (ref >60)
GFR calc non Af Amer: 54 mL/min — ABNORMAL LOW (ref >60)
Glucose, Bld: 161 mg/dL — ABNORMAL HIGH (ref 65–99)
Potassium: 3.3 mmol/L — ABNORMAL LOW (ref 3.5–5.1)
Sodium: 142 mmol/L (ref 135–145)
Total Bilirubin: 1.5 mg/dL — ABNORMAL HIGH (ref 0.3–1.2)
Total Protein: 6.5 g/dL (ref 6.5–8.1)

## 2015-03-02 LAB — GLUCOSE, CAPILLARY
GLUCOSE-CAPILLARY: 123 mg/dL — AB (ref 65–99)
GLUCOSE-CAPILLARY: 126 mg/dL — AB (ref 65–99)
GLUCOSE-CAPILLARY: 147 mg/dL — AB (ref 65–99)
Glucose-Capillary: 122 mg/dL — ABNORMAL HIGH (ref 65–99)
Glucose-Capillary: 150 mg/dL — ABNORMAL HIGH (ref 65–99)
Glucose-Capillary: 152 mg/dL — ABNORMAL HIGH (ref 65–99)
Glucose-Capillary: 99 mg/dL (ref 65–99)

## 2015-03-02 LAB — CBC
HCT: 29.4 % — ABNORMAL LOW (ref 36.0–46.0)
Hemoglobin: 9.1 g/dL — ABNORMAL LOW (ref 12.0–15.0)
MCH: 27.3 pg (ref 26.0–34.0)
MCHC: 31 g/dL (ref 30.0–36.0)
MCV: 88.3 fL (ref 78.0–100.0)
Platelets: 309 10*3/uL (ref 150–400)
RBC: 3.33 MIL/uL — ABNORMAL LOW (ref 3.87–5.11)
RDW: 17.5 % — ABNORMAL HIGH (ref 11.5–15.5)
WBC: 9 10*3/uL (ref 4.0–10.5)

## 2015-03-02 MED ORDER — DIGOXIN 0.25 MG/ML IJ SOLN
0.2500 mg | Freq: Every day | INTRAMUSCULAR | Status: AC
  Administered 2015-03-02: 0.25 mg via INTRAVENOUS
  Filled 2015-03-02: qty 1

## 2015-03-02 MED ORDER — DIGOXIN 125 MCG PO TABS
0.1250 mg | ORAL_TABLET | Freq: Every day | ORAL | Status: DC
  Administered 2015-03-03 – 2015-03-04 (×2): 0.125 mg via ORAL
  Filled 2015-03-02 (×3): qty 1

## 2015-03-02 MED ORDER — POTASSIUM CHLORIDE 10 MEQ/50ML IV SOLN: 10.0000 meq | INTRAVENOUS | Status: DC

## 2015-03-02 MED ORDER — METOPROLOL TARTRATE 12.5 MG HALF TABLET
12.5000 mg | ORAL_TABLET | Freq: Three times a day (TID) | ORAL | Status: DC
  Administered 2015-03-02 (×2): 12.5 mg via ORAL
  Filled 2015-03-02 (×5): qty 1

## 2015-03-02 MED ORDER — POTASSIUM CHLORIDE 10 MEQ/50ML IV SOLN
INTRAVENOUS | Status: AC
  Filled 2015-03-02: qty 150

## 2015-03-02 MED ORDER — GERHARDT'S BUTT CREAM
TOPICAL_CREAM | Freq: Four times a day (QID) | CUTANEOUS | Status: DC
  Administered 2015-03-02: 1 via TOPICAL
  Administered 2015-03-02 (×2): via TOPICAL
  Filled 2015-03-02 (×2): qty 1

## 2015-03-02 MED ORDER — POTASSIUM CHLORIDE 10 MEQ/50ML IV SOLN
10.0000 meq | INTRAVENOUS | Status: AC
  Administered 2015-03-02 (×2): 10 meq via INTRAVENOUS
  Filled 2015-03-02 (×2): qty 50

## 2015-03-02 MED ORDER — POTASSIUM CHLORIDE 10 MEQ/50ML IV SOLN
10.0000 meq | INTRAVENOUS | Status: AC
  Administered 2015-03-02 (×3): 10 meq via INTRAVENOUS

## 2015-03-02 NOTE — Progress Notes (Signed)
Patient placed on 28% trach collar.  Currently tolerating well.  Will continue to monitor.  

## 2015-03-02 NOTE — Progress Notes (Signed)
Speech Language Pathology Treatment: Breanna Alvarez Speaking valve  Patient Details Name: Breanna Alvarez MRN: [id number] DOB: 1950-09-29 Today's Date: 03/02/2015 Time: 1030-1050 SLP Time Calculation (min) (ACUTE ONLY): 20 min  Assessment / Plan / Recommendation Clinical Impression  During PMSV treatment, pt was on trach collar, tolerated cuff deflation for approximately 20 minutes and PMSV placement for approximately 18 minutes. Pt's respiratory rate fluctuated from around 26-36 during the trial, her oxygen remained around 99, and heart rate between 100-110. Pt had minimal tracheal and moderate oral secretions, cleared with suctioning. Pt had good redirection of air through upper air way, no air trapping in trach, her vocal intensity was low. Throughout the session, the pt was fatiqued and had limited verbalizations, she did not count to three despite max verbal cues and modeling. SLP did not do any po trials due to pt's lethargy and increasing respiratory rate. SLP left cuff deflated. Recommends that staff place PMSV whenever in the room so pt has the opportunity to communicate. SLP will f/u with further PMSV treatment and po trials.    HPI Other Pertinent Information: 64 yo female never smoker with hx HTN, CAD previously refused CABG ultimately admitted 9/20 for CVTS eval and CABG after ongoing chest pain. On 02/01/15 she had a CORONARY ARTERY BYPASS GRAFTING (CABG), MITRAL VALVE REPAIR (MVR), and TRANSESOPHAGEAL ECHOCARDIOGRAM (TEE) and has had complicated course with cardiogenic shock s/p centrimag placement (temporary LVAD) and removal (on 9/30), ischemic cardiomyopathy with EF 20%, open sternum now s/p sternal closure 10/3. She remained intubated since initial CABG 9/26 with failure to wean, she was trached (6mm Shiley) on 02/15/15, trach collar trials started. She had a possible aspiration event from tube feeds on 10/13, PEG placed 10/19.    Pertinent Vitals Pain Assessment: Faces Faces Pain  Scale: No hurt  SLP Plan  Continue with current plan of care    Recommendations Diet recommendations: NPO      Patient may use Passy-Alvarez Speech Valve: Intermittently with supervision PMSV Supervision: Full       Plan: Continue with current plan of care    GO    Riccardo DubinKristen Royelle Hinchman, Student-SLP  Riccardo DubinKristen Brayam Boeke 03/02/2015, 12:17 PM

## 2015-03-02 NOTE — Progress Notes (Signed)
  22 Days Post-Op Procedure(s) (LRB): STERNAL CLOSURE (N/A) TRANSESOPHAGEAL ECHOCARDIOGRAM (TEE) (N/A) Subjective: 22 Days Post-Op Procedure(s) (LRB): STERNAL CLOSURE (N/A) TRANSESOPHAGEAL ECHOCARDIOGRAM (TEE) (N/A) Subjective:  CABG-LVAD for CAD, EF .15 preop  COPD , aspiration from TF and swallow dysfunction Trach , OOB for PS weans Off antibiotics PEG placed- no residuals Now tolerating trach collar Too weak to stand- PT seeing patient Milrinone weaned off- nsr, last echo ef .35 CXR clear today Patient more alert and interactive Objective: Vital signs in last 24 hours: Temp:  [98.4 F (36.9 C)-100.3 F (37.9 C)] 98.4 F (36.9 C) (10/25 1240) Pulse Rate:  [114-139] 124 (10/25 1211) Cardiac Rhythm:  [-] Sinus tachycardia (10/25 0800) Resp:  [12-32] 29 (10/25 1211) BP: (82-164)/(40-89) 135/72 mmHg (10/25 1200) SpO2:  [98 %-100 %] 98 % (10/25 1211) FiO2 (%):  [28 %-40 %] 28 % (10/25 1211) Weight:  [93 lb 11.1 oz (42.5 kg)] 93 lb 11.1 oz (42.5 kg) (10/25 0400)  Hemodynamic parameters for last 24 hours: CVP:  [6 mmHg] 6 mmHg  Intake/Output from previous day: 10/24 0701 - 10/25 0700 In: 3210 [I.V.:1680; NG/GT:1180; IV Piggyback:50] Out: 3010 [Urine:2760; Stool:250] Intake/Output this shift: Total I/O In: -  Out: 1400 [Urine:1300; Stool:100]    OOB to chair Neuro intact Incisions clean Lungs clear Lab Results:  Recent Labs  03/01/15 0352 03/02/15 0401  WBC 8.5 9.0  HGB 9.8* 9.1*  HCT 31.5* 29.4*  PLT 320 309   BMET:   Recent Labs  02/28/15 0614 03/02/15 0401  NA 141 142  K 3.6 3.3*  CL 104 106  CO2 27 26  GLUCOSE 131* 161*  BUN 37* 39*  CREATININE 1.23* 1.08*  CALCIUM 8.5* 8.5*    PT/INR:   Recent Labs  03/01/15 0815  LABPROT 16.4*  INR 1.31   ABG    Component Value Date/Time   PHART 7.438 02/26/2015 0845   HCO3 28.1* 02/26/2015 0845   TCO2 24 02/26/2015 1554   ACIDBASEDEF 1.6 02/23/2015 0420   O2SAT 70.9 02/28/2015 0511   CBG  (last 3)   Recent Labs  03/02/15 0359 03/02/15 0759 03/02/15 1239  GLUCAP 150* 122* 147*    Assessment/Plan: S/P Procedure(s) (LRB): STERNAL CLOSURE (N/A) TRANSESOPHAGEAL ECHOCARDIOGRAM (TEE) (N/A)  persistent sinus tach with low EF - add digoxin Trach collar weaning- swallow study pending Remove EPWs Patient needs ICU care due to pulmonary status and still  too weak to walk   LOS: 35 days    Kathlee Nationseter Van Trigt III 03/02/2015

## 2015-03-02 NOTE — Progress Notes (Signed)
Pt is on ATC stable at this time, no distress or complications noted.

## 2015-03-02 NOTE — Progress Notes (Signed)
PT Cancellation Note  Patient Details Name: Salli QuarryMaria T Norville MRN: [id number] DOB: [birth year]   Cancelled Treatment:    Reason Eval/Treat Not Completed: Medical issues which prohibited therapy Pt on bedrest after pacing wires pulled. Will follow up next available time.   Blake DivineShauna A Dandra Shambaugh 03/02/2015, 3:48 PM Mylo RedShauna Alyssa Rotondo, PT, DPT [phone]

## 2015-03-03 DIAGNOSIS — J80 Acute respiratory distress syndrome: Secondary | ICD-10-CM

## 2015-03-03 LAB — CBC
HCT: 31.3 % — ABNORMAL LOW (ref 36.0–46.0)
Hemoglobin: 9.9 g/dL — ABNORMAL LOW (ref 12.0–15.0)
MCH: 28.2 pg (ref 26.0–34.0)
MCHC: 31.6 g/dL (ref 30.0–36.0)
MCV: 89.2 fL (ref 78.0–100.0)
Platelets: 315 10*3/uL (ref 150–400)
RBC: 3.51 MIL/uL — ABNORMAL LOW (ref 3.87–5.11)
RDW: 17.4 % — ABNORMAL HIGH (ref 11.5–15.5)
WBC: 9.5 10*3/uL (ref 4.0–10.5)

## 2015-03-03 LAB — COMPREHENSIVE METABOLIC PANEL
ALT: 58 U/L — ABNORMAL HIGH (ref 14–54)
AST: 35 U/L (ref 15–41)
Albumin: 2.4 g/dL — ABNORMAL LOW (ref 3.5–5.0)
Alkaline Phosphatase: 135 U/L — ABNORMAL HIGH (ref 38–126)
Anion gap: 10 (ref 5–15)
BUN: 38 mg/dL — ABNORMAL HIGH (ref 6–20)
CO2: 26 mmol/L (ref 22–32)
Calcium: 8.4 mg/dL — ABNORMAL LOW (ref 8.9–10.3)
Chloride: 101 mmol/L (ref 101–111)
Creatinine, Ser: 1 mg/dL (ref 0.44–1.00)
GFR calc Af Amer: >60 mL/min (ref >60)
GFR calc non Af Amer: 59 mL/min — ABNORMAL LOW (ref >60)
Glucose, Bld: 184 mg/dL — ABNORMAL HIGH (ref 65–99)
Potassium: 3.6 mmol/L (ref 3.5–5.1)
Sodium: 137 mmol/L (ref 135–145)
Total Bilirubin: 1.7 mg/dL — ABNORMAL HIGH (ref 0.3–1.2)
Total Protein: 6.8 g/dL (ref 6.5–8.1)

## 2015-03-03 LAB — GLUCOSE, CAPILLARY
GLUCOSE-CAPILLARY: 163 mg/dL — AB (ref 65–99)
GLUCOSE-CAPILLARY: 133 mg/dL — AB (ref 65–99)
GLUCOSE-CAPILLARY: 131 mg/dL — AB (ref 65–99)
GLUCOSE-CAPILLARY: 131 mg/dL — AB (ref 65–99)
Glucose-Capillary: 140 mg/dL — ABNORMAL HIGH (ref 65–99)

## 2015-03-03 MED ORDER — CEFTAZIDIME 1 G IJ SOLR: 1.0000 g | Freq: Three times a day (TID) | INTRAMUSCULAR | Status: DC

## 2015-03-03 MED ORDER — GERHARDT'S BUTT CREAM
TOPICAL_CREAM | Freq: Four times a day (QID) | CUTANEOUS | Status: DC | PRN
  Administered 2015-03-03 (×2): via TOPICAL

## 2015-03-03 MED ORDER — ENOXAPARIN SODIUM 30 MG/0.3ML ~~LOC~~ SOLN
30.0000 mg | SUBCUTANEOUS | Status: DC
  Administered 2015-03-03 – 2015-03-15 (×13): 30 mg via SUBCUTANEOUS
  Filled 2015-03-03 (×15): qty 0.3

## 2015-03-03 MED ORDER — PANTOPRAZOLE SODIUM 40 MG PO PACK
40.0000 mg | PACK | Freq: Every day | ORAL | Status: DC
  Administered 2015-03-04 – 2015-03-15 (×12): 40 mg
  Filled 2015-03-03 (×13): qty 20

## 2015-03-03 MED ORDER — POTASSIUM CHLORIDE 10 MEQ/50ML IV SOLN
10.0000 meq | INTRAVENOUS | Status: AC
  Administered 2015-03-03 (×3): 10 meq via INTRAVENOUS
  Filled 2015-03-03 (×3): qty 50

## 2015-03-03 MED ORDER — DEXTROSE 5 % IV SOLN
2.0000 g | Freq: Two times a day (BID) | INTRAVENOUS | Status: DC
  Filled 2015-03-03 (×2): qty 2

## 2015-03-03 MED ORDER — METOPROLOL TARTRATE 25 MG PO TABS
25.0000 mg | ORAL_TABLET | Freq: Two times a day (BID) | ORAL | Status: DC
  Administered 2015-03-03 – 2015-03-15 (×25): 25 mg via ORAL
  Filled 2015-03-03 (×30): qty 1

## 2015-03-03 MED ORDER — DEXTROSE 5 % IV SOLN
2.0000 g | Freq: Two times a day (BID) | INTRAVENOUS | Status: AC
  Administered 2015-03-03 – 2015-03-14 (×24): 2 g via INTRAVENOUS
  Filled 2015-03-03 (×25): qty 2

## 2015-03-03 NOTE — Progress Notes (Signed)
23 Days Post-Op Procedure(s) (LRB): STERNAL CLOSURE (N/A) TRANSESOPHAGEAL ECHOCARDIOGRAM (TEE) (N/A) Subjective: Status post CABG for ischemic cardiomyopathy ejection fraction 0.15 Postop percutaneous LVAD, now removed Postop aspiration pneumonia requiring tracheostomy Patient has been on trach collar for 24 hours with improved chest x-ray. She remains with heavy secretions. Sputum has been recultured. Temperature 101 last night so we'll cover with empiric antibiotics-Fortaz until results of cultures are final. Sternal incision remains clean and dry with intact skin staples  Objective: Vital signs in last 24 hours: Temp:  [98.4 F (36.9 C)-101 F (38.3 C)] 99.2 F (37.3 C) (10/26 0405) Pulse Rate:  [117-139] 130 (10/26 0900) Cardiac Rhythm:  [-] Sinus tachycardia (10/26 0741) Resp:  [0-43] 34 (10/26 0900) BP: (94-172)/(40-89) 147/75 mmHg (10/26 0900) SpO2:  [98 %-100 %] 100 % (10/26 0900) FiO2 (%):  [28 %] 28 % (10/26 0850) Weight:  [89 lb 15.2 oz (40.8 kg)] 89 lb 15.2 oz (40.8 kg) (10/26 0427)  Hemodynamic parameters for last 24 hours:   sinus tachycardia  Intake/Output from previous day: 10/25 0701 - 10/26 0700 In: 2510 [I.V.:1320; NG/GT:1100] Out: 3920 [Urine:3520; Stool:400] Intake/Output this shift: Total I/O In: 470 [I.V.:120; Other:50; NG/GT:150; IV Piggyback:150] Out: 45 [Urine:45]  Responsive sitting up in chair Still weak, unable to  walk Scattered rhonchi No murmur Lab Results:  Recent Labs  03/02/15 0401 03/03/15 0418  WBC 9.0 9.5  HGB 9.1* 9.9*  HCT 29.4* 31.3*  PLT 309 315   BMET:  Recent Labs  03/02/15 0401 03/03/15 0418  NA 142 137  K 3.3* 3.6  CL 106 101  CO2 26 26  GLUCOSE 161* 184*  BUN 39* 38*  CREATININE 1.08* 1.00  CALCIUM 8.5* 8.4*    PT/INR:  Recent Labs  03/01/15 0815  LABPROT 16.4*  INR 1.31   ABG    Component Value Date/Time   PHART 7.438 02/26/2015 0845   HCO3 28.1* 02/26/2015 0845   TCO2 24 02/26/2015 1554   ACIDBASEDEF 1.6 02/23/2015 0420   O2SAT 70.9 02/28/2015 0511   CBG (last 3)   Recent Labs  03/02/15 2341 03/03/15 0346 03/03/15 0801  GLUCAP 99 163* 133*    Assessment/Plan: S/P Procedure(s) (LRB): STERNAL CLOSURE (N/A) TRANSESOPHAGEAL ECHOCARDIOGRAM (TEE) (N/A) Recurrent fever with history of aspiration pneumonia and heavy secretions Repeat sputum culture pending Cover with empiric Fortaz until culture report  is final Continue his physical therapy and trach collar Patient did not past swallow test and is being fed via PEG tube   LOS: 36 days    Breanna Alvarez 03/03/2015

## 2015-03-03 NOTE — Progress Notes (Signed)
Pt's husband Leonette MostCharles called regarding pt request to call Faith and have her come up to see pt

## 2015-03-03 NOTE — Progress Notes (Signed)
Speech Language Pathology Treatment: Dysphagia;Passy Muir Speaking valve  Patient Details Name: Breanna QuarryMaria T Ehrler MRN: [id number] DOB: [birth year] Today's Date: 03/03/2015 Time: 1015-1040 SLP Time Calculation (min) (ACUTE ONLY): 25 min  Assessment / Plan / Recommendation Clinical Impression  During PMSV and dysphagia treatment, pt tolerated cuff deflation for approximately 25 minutes and PMSV placement for about 20 minutes with full supervision. Pt's respiratory rate averaged around 26, SPO2 100%, and heart rate around 110. RN provided deep suctioning, minimal secretions were suctioned at trach and orally. Pt had good redirection of air through upper airway, no air trapping in tach. Pt's vocal intensity was improved today. Pt participated in conversation and told a story with moderate cuing to increase volume by pacing speech and breathing. Due to pt's increased participation and management of secretions, SLP trialed ice chips, thin liquid, and puree. Pt demonstrated overt s/s of aspiration (immediate throat clear) with ice chips and thin liquid and no overt s/s of aspiration with puree. Pt was left with cuff deflated and PMSV in place. Pt showed progress today, SLP recommends intermittent PMSV supervision and PMSV worn during all waking hours and therapy. Pt's independence in managing secretions and upper airway sensation will improve with cuff deflated and PMSV. SLP will f/u with PMSV treatment and po trials to determine readiness for objective swallow evaluation.    HPI Other Pertinent Information: 64 yo female never smoker with hx HTN, CAD previously refused CABG ultimately admitted 9/20 for CVTS eval and CABG after ongoing chest pain. On 02/01/15 she had a CORONARY ARTERY BYPASS GRAFTING (CABG), MITRAL VALVE REPAIR (MVR), and TRANSESOPHAGEAL ECHOCARDIOGRAM (TEE) and has had complicated course with cardiogenic shock s/p centrimag placement (temporary LVAD) and removal (on 9/30), ischemic cardiomyopathy  with EF 20%, open sternum now s/p sternal closure 10/3. She remained intubated since initial CABG 9/26 with failure to wean, she was trached (6mm Shiley) on 02/15/15, trach collar trials started. She had a possible aspiration event from tube feeds on 10/13, PEG placed 10/19.    Pertinent Vitals Pain Assessment: Faces Faces Pain Scale: No hurt  SLP Plan  Continue with current plan of care    Recommendations Diet recommendations: NPO Medication Administration: Via alternative means      Patient may use Passy-Muir Speech Valve: During all waking hours (remove during sleep);During all therapies with supervision PMSV Supervision: Intermittent       Oral Care Recommendations: Oral care QID Plan: Continue with current plan of care    GO    Riccardo DubinKristen Mariely Mahr, Student-SLP  Riccardo DubinKristen Suly Vukelich 03/03/2015, 10:52 AM

## 2015-03-03 NOTE — Progress Notes (Signed)
Physical Therapy Treatment Patient Details Name: Breanna Alvarez MRN: [id number] DOB: 03-19-1951 Today's Date: 03/03/2015    History of Present Illness Breanna Alvarez is a 63 y.o. female who has PMHx of CAD, CHF and had an NSTEMI in ealy 2016, present to ED with ongoing CP.  She is now s/p CABG as of 9/27 with Centrimag placement, 9/30 Centrimag removal, 10/3 sternal closure, Wound I and D 10/4, failed extubation 10/7, 10/10 trached, ?aspiration on 10/13 and worsening status.     First attempt wean to [facility]. 10/19.    PT Comments    Pt has started to make strength and mild functional gains.  Emphasis on bed mobility, sitting balance and standing trials x4.  Follow Up Recommendations  SNF     Equipment Recommendations  Other (comment) (TBA)    Recommendations for Other Services       Precautions / Restrictions Precautions Precautions: Fall;Sternal Restrictions Weight Bearing Restrictions: No    Mobility  Bed Mobility Overal bed mobility: Needs Assistance;+2 for physical assistance Bed Mobility: Supine to Sit;Sit to Supine     Supine to sit: Total assist;+2 for physical assistance Sit to supine: Total assist;+2 for physical assistance   General bed mobility comments: needed truncal assist throughout.  Assisted up via R elbow.  Assisted to EOB  Transfers Overall transfer level: Needs assistance Equipment used:  (chair back) Transfers: Sit to/from Stand Sit to Stand: Max assist;Total assist;+2 physical assistance (x4)            Ambulation/Gait             General Gait Details: unable   Stairs            Wheelchair Mobility    Modified Rankin (Stroke Patients Only)       Balance Overall balance assessment: Needs assistance Sitting-balance support: Single extremity supported;Bilateral upper extremity supported Sitting balance-Leahy Scale: Poor Sitting balance - Comments: 10 min working on sitting balance. Using UE's in prep for scoot and  transition to sit   Standing balance support: Bilateral upper extremity supported Standing balance-Leahy Scale: Poor Standing balance comment: sit to stand x 4 working to support bil knees and get the Upper trunk/spine extended.  pt able to bear weight bilaterally with assist.                    Cognition Arousal/Alertness: Awake/alert;Lethargic Behavior During Therapy: WFL for tasks assessed/performed Overall Cognitive Status: Impaired/Different from baseline Area of Impairment: Orientation;Attention;Safety/judgement;Awareness;Problem solving Orientation Level: Situation;Time Current Attention Level: Sustained   Following Commands: Follows one step commands inconsistently Safety/Judgement: Decreased awareness of safety;Decreased awareness of deficits Awareness: Intellectual Problem Solving: Slow processing      Exercises Other Exercises Other Exercises: leg kicks at EOB Other Exercises: hip/knee flexion extention exercise with graded resistance.    General Comments        Pertinent Vitals/Pain Pain Assessment: Faces Faces Pain Scale: No hurt    Home Living                      Prior Function            PT Goals (current goals can now be found in the care plan section) Acute Rehab PT Goals Patient Stated Goal: not stated PT Goal Formulation: Patient unable to participate in goal setting Time For Goal Achievement: 03/12/15 Potential to Achieve Goals: Fair Progress towards PT goals: Progressing toward goals    Frequency  Min 3X/week  PT Plan Current plan remains appropriate    Co-evaluation             End of Session   Activity Tolerance: Patient tolerated treatment well Patient left: in bed;with call bell/phone within reach;with nursing/sitter in room     Time: [id number] PT Time Calculation (min) (ACUTE ONLY): 23 min  Charges:  $Therapeutic Activity: 23-37 mins                    G Codes:      Breanna Alvarez, Breanna GumKenneth  Alvarez 03/03/2015, 4:35 PM 03/03/2015  [place] Breanna Alvarez, PT [phone]  (pager)

## 2015-03-03 NOTE — Progress Notes (Signed)
Pt is stable at this time, resting comfortably. No distress noted.

## 2015-03-03 NOTE — Clinical Social Work Note (Signed)
CSW left message for patient's husband to discuss disposition.  CSW awaiting a return call.  Projected Disposition: [facility]  Vickii PennaGina Ayisha Pol, LCSW [phone]  [facility] Licensed Clinical Social Worker

## 2015-03-03 NOTE — Progress Notes (Signed)
PULMONARY / CRITICAL CARE MEDICINE   Name: Breanna Alvarez MRN: [id number] DOB: [birth year]    ADMISSION DATE:  01/26/2015 CONSULTATION DATE:  02/09/2105  REFERRING MD :  Donata Clay   CHIEF COMPLAINT:  Vent management   INITIAL PRESENTATION:  64 yo female admitted with chest pain from CAD.  She had CABG 02/01/15 >> post-op complicated by cardiogenic shock s/p centrimag placement and removal, ischemic cardiomyopathy with EF 20%, open sternum.  She remained on vent post-op and PCCM consulted to assist with vent weaning.  STUDIES:  9/21 Echo >> EF 20-25%, mod MR, PA press 51 mmHg 10/15 Echo >> EF 25 to 30%, PAS 36 mmHg  SIGNIFICANT EVENTS: 09/26 CABG x 4, placement of centrimag L ventricular assistive device  09/30 removal centrimag  10/03 sternal closure 10/09 Hg drop overnight by 1 gm and transfusion ordered 10/10 Trach by DF 10/12 Off precedex 10/13 possible aspiration event; TNA started 10/14 difficulty with agitation 10/16 off pressors 10/17 off TNA 10/18 Fever 101, transfuse 1 unit PRBC 10/20 start trach collar trials; off milrinone; G tube placed 02/28/15 ;Tolerating trach collar at daytime. On vent at night. 03/01/15 :atc x daytime. Sitting in chair. RN who had her before weekend reports no issues. More alert. Nods to simple Qs  SUBJECTIVE/OVERNIGHT/INTERVAL HX Remains on TC  VITAL SIGNS: Temp:  [98.4 F (36.9 C)-101 F (38.3 C)] 99.2 F (37.3 C) (10/26 0405) Pulse Rate:  [117-139] 130 (10/26 0900) Resp:  [0-43] 34 (10/26 0900) BP: (94-172)/(40-89) 147/75 mmHg (10/26 0900) SpO2:  [98 %-100 %] 100 % (10/26 0900) FiO2 (%):  [28 %] 28 % (10/26 0850) Weight:  [40.8 kg (89 lb 15.2 oz)] 40.8 kg (89 lb 15.2 oz) (10/26 0427)   HEMODYNAMICS:     VENTILATOR SETTINGS: Vent Mode:  [-]  FiO2 (%):  [28 %] 28 %  INTAKE / OUTPUT:  Intake/Output Summary (Last 24 hours) at 03/03/15 0958 Last data filed at 03/03/15 0947  Gross per 24 hour  Intake   2870 ml  Output   3715  ml  Net   -845 ml   PHYSICAL EXAMINATION: General: Awake, No distress. Deconditioned, was in a chair successful Neuro: No focal deficits HEENT: Trach site clean Cardiovascular: RRR, No MRG Lungs: Coarse , secretions are an issue Abdomen: soft, non tender, G tube site clean Musculoskeletal: no edema  LABS: PULMONARY  Recent Labs Lab 02/24/15 1521  02/25/15 0350 02/26/15 0755 02/26/15 0810 02/26/15 0845 02/26/15 1554 02/27/15 0400 02/28/15 0511  PHART  --   --  7.445  --  7.475* 7.438  --   --   --   PCO2ART  --   --  37.2  --  37.8 41.9  --   --   --   PO2ART  --   --  108*  --  104.0* 94.0  --   --   --   HCO3  --   --  25.2*  --  27.7* 28.1*  --   --   --   TCO2 25  --  26.3  --  --   --   O2SAT  --   < > 72.2  98.3 77.1 98.0 97.0  --  78.1 70.9  < > = values in this interval not displayed.  CBC  Recent Labs Lab 03/01/15 0352 03/02/15 0401 03/03/15 0418  HGB 9.8* 9.1* 9.9*  HCT 31.5* 29.4* 31.3*  WBC 8.5 9.0 9.5  PLT 320 309 315  CHEMISTRY  Recent Labs Lab 02/27/15 2118 02/28/15 0457 02/28/15 0614 03/02/15 0401 03/03/15 0418  NA 139 140 141 142 137  K 3.1* 6.7* 3.6 3.3* 3.6  CL 102 105 104 106 101  CO2 27 28 27 26 26   GLUCOSE 160* 129* 131* 161* 184*  BUN 32* 35* 37* 39* 38*  CREATININE 1.20* 1.21* 1.23* 1.08* 1.00  CALCIUM 8.5* 8.1* 8.5* 8.5* 8.4*   Estimated Creatinine Clearance: 37.6 mL/min (by C-G formula based on Cr of 1).  LIVER  Recent Labs Lab 02/26/15 0316 02/27/15 0436 02/28/15 0457 03/01/15 0815 03/02/15 0401 03/03/15 0418  AST 23 39 43*  --  37 35  ALT 47 53 52  --  55* 58*  ALKPHOS 101 117 106  --  113 135*  BILITOT 2.4* 2.0* 1.7*  --  1.5* 1.7*  PROT 6.7 6.5 6.4*  --  6.5 6.8  ALBUMIN 2.4* 2.2* 2.1*  --  2.3* 2.4*  INR  --   --   --  1.31  --   --    ENDOCRINE CBG (last 3)   Recent Labs  03/02/15 2341 03/03/15 0346 03/03/15 0801  GLUCAP 99 163* 133*    Iron/TIBC/Ferritin/ %Sat    Component  Value Date/Time   IRON 19* 02/22/2015 0900   TIBC 143* 02/22/2015 0900   FERRITIN 1025* 02/22/2015 0900   IRONPCTSAT 13 02/22/2015 0900    IMAGING x48h Dg Chest Port 1 View  03/02/2015  CLINICAL DATA:  Follow-up of respiratory failure EXAM: PORTABLE CHEST 1 VIEW COMPARISON:  Portable chest x-rays of October 20 and 21, 2016. FINDINGS: The lungs are borderline hypoinflated. The pulmonary interstitium has improved bilaterally. The cardiac silhouette remains enlarged. The pulmonary vascularity is less engorged. There is confluent interstitial and alveolar opacity in the right upper lobe. There are post CABG changes. There are skin staples present in the midline over the sternum. The tracheostomy appliance tip lies approximately 1 cm below the inferior margin of the clavicular heads. IMPRESSION: There has been moderate improvement in pulmonary edema. There remains mild pulmonary edema bilaterally, atelectasis or pneumonia in the right upper lobe, and mild cardiomegaly. Electronically Signed   By: David  SwazilandJordan M.D.   On: 03/02/2015 07:40    ASSESSMENT / PLAN:  PULMONARY ETT 9/26 >> 10/07 Trach (DF) 10/10 >> A: Acute on chronic respiratory failure 2nd to acute pulmonary edema/cardiogenic shock after CABG, deconditioning, and aspiration pneumonia. Failure to wean from vent s/p tracheostomy. Edema component  P:   TC at 24 hr, maintain Cuff down, OKay by me unless secretions thicken and increase further Would prefer continued PMV attempts with supervision Trach care, bronchial hygiene Prn albuterol Keep cuff trach until off vent 72 hr then can consider 6 cuff less but NOT 4 with secretion noted pcxr even with penetration difference , edema better with lasix  CARDIOVASCULAR Lt PICC 10/03 >>  Lt brachial aline 10/13 >>  A: CAD s/p CABG Acute on chronic systolic heart failure with ischemic CM. Cardiogenic shock. HLD >> reported muscle aches with Crestor.  P:  Continue ASA, lopressor,  NTG patch Lasix per TCTS and cardiology, improved with this  RENAL A: AKI in setting of cardiogenic shock >> baseline creatinine 0.93 from 10/01/14 Hypokalemia.   - improving with lasix P:   Maintain lasix per cvts k supp kvo  GASTROINTESTINAL A: Protein calorie malnutrition. Elevated LFT's >> trending down. Dysphagia s/p G tube by IR 10/20 P:   Tube feeds tolerated assess last BM  Protonix for SUP  HEMATOLOGIC A: Anemia of critical illness. Slight hemoconcentration lasix P:  Lovenox we should restart?? Lasix Limit phlebotomy when able  INFECTIOUS A: Aspiration pneumonia 10/13. P:   Zosyn 10/12 >> 10/21.  Got 10 days ceftaz 10/26>>>  Follow sputum  Blood 10/18 >> NGTD  ENDOCRINE A: Hyperglycemia. P:   SSI  NEUROLOGIC A: Agitated delirium. Deconditioning.   - slowly improving P:   Continue seroquel, klonopin PRN versed RASS goal 0 PT/OT assessment  DISPOSITION: Would be good candidate for LTAC >> not eligible with Medicaid.  Disposition will depend on how well she weans off vent.Mcarthur Rossetti. Tyson Alias, MD, FACP Pgr: [phone] [facility]

## 2015-03-03 NOTE — Progress Notes (Signed)
      [address]       [place] [id number]             [phone]      On trach collar  Stable day  No new issues  Salvatore DecentSteven C. Dorris FetchHendrickson, MD [facility] [phone]

## 2015-03-04 ENCOUNTER — Inpatient Hospital Stay (HOSPITAL_COMMUNITY): Payer: Medicaid Other

## 2015-03-04 LAB — GLUCOSE, CAPILLARY
GLUCOSE-CAPILLARY: 111 mg/dL — AB (ref 65–99)
GLUCOSE-CAPILLARY: 123 mg/dL — AB (ref 65–99)
GLUCOSE-CAPILLARY: 159 mg/dL — AB (ref 65–99)
GLUCOSE-CAPILLARY: 170 mg/dL — AB (ref 65–99)
Glucose-Capillary: 151 mg/dL — ABNORMAL HIGH (ref 65–99)
Glucose-Capillary: 97 mg/dL (ref 65–99)

## 2015-03-04 LAB — BASIC METABOLIC PANEL
Anion gap: 9 (ref 5–15)
BUN: 42 mg/dL — ABNORMAL HIGH (ref 6–20)
CO2: 27 mmol/L (ref 22–32)
Calcium: 8.8 mg/dL — ABNORMAL LOW (ref 8.9–10.3)
Chloride: 103 mmol/L (ref 101–111)
Creatinine, Ser: 1.01 mg/dL — ABNORMAL HIGH (ref 0.44–1.00)
GFR calc Af Amer: >60 mL/min (ref >60)
GFR calc non Af Amer: 58 mL/min — ABNORMAL LOW (ref >60)
Glucose, Bld: 177 mg/dL — ABNORMAL HIGH (ref 65–99)
Potassium: 3.6 mmol/L (ref 3.5–5.1)
Sodium: 139 mmol/L (ref 135–145)

## 2015-03-04 LAB — CULTURE, RESPIRATORY W GRAM STAIN
Culture: NO GROWTH
Special Requests: NORMAL

## 2015-03-04 LAB — CBC
HCT: 32.1 % — ABNORMAL LOW (ref 36.0–46.0)
Hemoglobin: 9.9 g/dL — ABNORMAL LOW (ref 12.0–15.0)
MCH: 27.4 pg (ref 26.0–34.0)
MCHC: 30.8 g/dL (ref 30.0–36.0)
MCV: 88.9 fL (ref 78.0–100.0)
Platelets: 342 10*3/uL (ref 150–400)
RBC: 3.61 MIL/uL — ABNORMAL LOW (ref 3.87–5.11)
RDW: 17.3 % — ABNORMAL HIGH (ref 11.5–15.5)
WBC: 8.1 10*3/uL (ref 4.0–10.5)

## 2015-03-04 MED ORDER — POTASSIUM CHLORIDE 20 MEQ/15ML (10%) PO SOLN
20.0000 meq | ORAL | Status: AC
Start: 2015-03-04 — End: 2015-03-04
  Administered 2015-03-04 (×2): 20 meq
  Filled 2015-03-04 (×2): qty 15

## 2015-03-04 MED ORDER — ACETYLCYSTEINE 20 % IN SOLN
3.0000 mL | RESPIRATORY_TRACT | Status: DC
  Administered 2015-03-04 – 2015-03-07 (×15): 3 mL via RESPIRATORY_TRACT
  Filled 2015-03-04 (×34): qty 4

## 2015-03-04 MED ORDER — ALBUTEROL SULFATE (2.5 MG/3ML) 0.083% IN NEBU
2.5000 mg | INHALATION_SOLUTION | RESPIRATORY_TRACT | Status: DC
  Administered 2015-03-04 – 2015-03-07 (×15): 2.5 mg via RESPIRATORY_TRACT
  Filled 2015-03-04 (×16): qty 3

## 2015-03-04 MED ORDER — FUROSEMIDE 10 MG/ML IJ SOLN
20.0000 mg | Freq: Once | INTRAMUSCULAR | Status: AC
  Administered 2015-03-04: 20 mg via INTRAVENOUS
  Filled 2015-03-04: qty 2

## 2015-03-04 NOTE — Progress Notes (Signed)
Nutrition Follow Up  DOCUMENTATION CODES:   Underweight  INTERVENTION:    Continue Vital AF 1.2 formula at goal rate of 50 ml/hr  TF regimen to provide 1440 kcals, 90 gm protein, 973 ml of free water  NUTRITION DIAGNOSIS:   Inadequate oral intake related to inability to eat as evidenced by NPO status, ongoing  GOAL:   Patient will meet greater than or equal to 90% of their needs, met  MONITOR:   TF tolerance, Diet advancement, Labs, Weight trends, I & O's  ASSESSMENT:   64 yo female never smoker with hx HTN, CAD previously refused CABG ultimately admitted 9/20 for CVTS eval and CABG after ongoing chest pain. She underwent 4V CABG 9/26 and has had complicated course with cardiogenic shock s/p centrimag placement and removal, ischemic cardiomyopathy with EF 20%, open sternum now s/p sternal closure 10/3. She remains intubated since initial CABG 9/26 with poor weaning and PCCM now consulted to assist.   Patient s/p procedure 9/26: CORONARY ARTERY BYPASS GRAFTING x 4 PLACEMENT OF CENTRIMAG VENTRICULAR ASSIST DEVICE  Patient s/p procedure 10/20: IR GASTROSTOMY TUBE MOD SED  Pt currently on trach collar.  Vital AF 1.2 formula infusing at 50 ml/hr via G-tube providing 1440 kcals, 90 gm protein, 973 ml of free water.  Tolerating well.  Speech Path following for dysphagia, PMSV.  RD suspects malnutrition.  Unable to complete Nutrition Focused Physical Exam at this time.  Pt very lethargic after working with OT.    Diet Order:  Diet NPO time specified Except for: Sips with Meds  Skin:  sternal wound closed  Last BM:  10/27  Height:   Ht Readings from Last 1 Encounters:  03/04/15 5' (1.524 m)    Weight:   Wt Readings from Last 1 Encounters:  03/04/15 89 lb 15.2 oz (40.8 kg)    Ideal Body Weight:  45.4 kg  BMI:  Body mass index is 17.57 kg/(m^2).  Estimated Nutritional Needs:   Kcal:  1400-1600  Protein:  80-90 gm  Fluid:  per MD  EDUCATION NEEDS:   No  education needs identified at this time  Arthur Holms, RD, LDN Pager #: [phone] After-Hours Pager #: [phone]

## 2015-03-04 NOTE — Progress Notes (Signed)
Physical Therapy Treatment Patient Details Name: Breanna Alvarez MRN: [id number] DOB: 10-Aug-1950 Today's Date: 03/04/2015    History of Present Illness Breanna Alvarez is a 64 y.o. female who has PMHx of CAD, CHF and had an NSTEMI in ealy 2016, present to ED with ongoing CP.  She is now s/p CABG as of 9/27 with Centrimag placement, 9/30 Centrimag removal, 10/3 sternal closure, Wound I and D 10/4, failed extubation 10/7, 10/10 trached, ?aspiration on 10/13 and worsening status.     First attempt wean to [facility]. 10/19.    PT Comments    Progressing slowly.  Emphasis on standing.  Over 4 trials pt able to attain upright stance 4 times with graded support at knees, hips, sternum and head.  Follow Up Recommendations  SNF     Equipment Recommendations  Other (comment) (TBA)    Recommendations for Other Services       Precautions / Restrictions Precautions Precautions: Fall;Sternal    Mobility  Bed Mobility               General bed mobility comments: worked from chair level  Transfers Overall transfer level: Needs assistance Equipment used: Ambulation equipment used Transfers: Sit to/from Stand Sit to Stand: Max assist;+2 physical assistance (x4)         General transfer comment: cues for hand placement.  knee and truncal support  Ambulation/Gait             General Gait Details: unable   Stairs            Wheelchair Mobility    Modified Rankin (Stroke Patients Only)       Balance Overall balance assessment: Needs assistance Sitting-balance support: No upper extremity supported Sitting balance-Leahy Scale: Poor Sitting balance - Comments: sitting in total >15.  Short time without UE assist, but with tendency to list posteriorly.   Pt working on stabilizing for longer periods of time with UE assist   Standing balance support: During functional activity;Bilateral upper extremity supported Standing balance-Leahy Scale: Poor Standing balance comment:  4 standing trials working on [employer] with cues for hand placement, getting a good foundation.  Assist coming forward and support at knees and trunk to stand.  Graded assist to stand.  Pt could weakly maintain stance with knees guarded only, but needed truncal and hip support to attain upright stance.                    Cognition Arousal/Alertness: Awake/alert Behavior During Therapy: WFL for tasks assessed/performed Overall Cognitive Status: Impaired/Different from baseline Area of Impairment: Attention;Following commands;Problem solving   Current Attention Level: Sustained   Following Commands: Follows one step commands with increased time Safety/Judgement: Decreased awareness of deficits Awareness: Intellectual Problem Solving: Slow processing      Exercises      General Comments        Pertinent Vitals/Pain Pain Assessment: Faces Faces Pain Scale: Hurts little more Pain Location: sternum Pain Descriptors / Indicators: Sore;Grimacing Pain Intervention(s): Monitored during session;Repositioned    Home Living                      Prior Function            PT Goals (current goals can now be found in the care plan section) Acute Rehab PT Goals Patient Stated Goal: not stated PT Goal Formulation: Patient unable to participate in goal setting Time For Goal Achievement: 03/12/15 Potential to Achieve Goals: Fair Progress  towards PT goals: Progressing toward goals    Frequency  Min 3X/week    PT Plan Current plan remains appropriate    Co-evaluation             End of Session Equipment Utilized During Treatment: Oxygen Activity Tolerance: Patient tolerated treatment well Patient left: in chair;with call bell/phone within reach;with SCD's reapplied     Time: 4098-11911045-1115 PT Time Calculation (min) (ACUTE ONLY): 30 min  Charges:  $Therapeutic Activity: 23-37 mins                    G Codes:      Xayne Brumbaugh, Eliseo GumKenneth V 03/04/2015, 12:03  PM  03/04/2015  [place] BingKen Gelena Klosinski, PT [phone]  (pager)

## 2015-03-04 NOTE — Progress Notes (Signed)
Patient ID: Breanna Alvarez, female   DOB: 02/24/1951, 64 y.o.   MRN: [id number]  SICU Evening Rounds:  Hemodynamically stable  She has been on trach collar today but just put back on pressure support due to copious secretions and fatigue.  Urine output good.  Tolerating tube feeds.

## 2015-03-04 NOTE — Progress Notes (Signed)
Speech Language Pathology Treatment: Dysphagia;Passy Muir Speaking valve  Patient Details Name: Breanna QuarryMaria T Yiu MRN: [id number] DOB: 05-22-1950 Today's Date: 03/04/2015 Time: 4098-11910830-0855 SLP Time Calculation (min) (ACUTE ONLY): 25 min  Assessment / Plan / Recommendation Clinical Impression  PMSV placed prior to SLP arrival. During the session, pt's HR averaged around 120, SP02 was 100, and respiratory rate was around 27, increasing to 37. Pt had minimal oral secretions, secretions were slightly opaque. SLP had good redirection of air through the upper airway, no air trapping in trach. With moderate verbal cuing to clear throat, cough, and talk loud, pt's voice was clear and had adequate volume. Pt was fatigued today, said a few words/phrases, but was not conversational. SLP trialed puree with overt s/s of aspiration (immediate cough), pt previously did not cough following puree, suspect pharyngeal sensation is improving. SLP continues to recommend intermittent PMSV supervision and PMSV worn during all waking hours and therapy. SLP will f/u with PMSV treatment and po trials to determine readiness for objective swallow evaluation.     HPI Other Pertinent Information: 64 yo female never smoker with hx HTN, CAD previously refused CABG ultimately admitted 9/20 for CVTS eval and CABG after ongoing chest pain. On 02/01/15 she had a CORONARY ARTERY BYPASS GRAFTING (CABG), MITRAL VALVE REPAIR (MVR), and TRANSESOPHAGEAL ECHOCARDIOGRAM (TEE) and has had complicated course with cardiogenic shock s/p centrimag placement (temporary LVAD) and removal (on 9/30), ischemic cardiomyopathy with EF 20%, open sternum now s/p sternal closure 10/3. She remained intubated since initial CABG 9/26 with failure to wean, she was trached (6mm Shiley) on 02/15/15, trach collar trials started. She had a possible aspiration event from tube feeds on 10/13, PEG placed 10/19.    Pertinent Vitals Pain Assessment: Faces Faces Pain Scale:  No hurt  SLP Plan  Continue with current plan of care    Recommendations Diet recommendations: NPO Medication Administration: Via alternative means      Patient may use Passy-Muir Speech Valve: During all waking hours (remove during sleep);During all therapies with supervision PMSV Supervision: Intermittent       Oral Care Recommendations: Oral care QID Plan: Continue with current plan of care    GO    Riccardo DubinKristen Debi Cousin, Student-SLP  Riccardo DubinKristen Onesimo Lingard 03/04/2015, 9:27 AM

## 2015-03-04 NOTE — Progress Notes (Signed)
24 Days Post-Op Procedure(s) (LRB): STERNAL CLOSURE (N/A) TRANSESOPHAGEAL ECHOCARDIOGRAM (TEE) (N/A) Subjective: Doing well off the ventilator now for over 48 hours Blood pressure stable and she remains in sinus tachycardia Surgical incisions are healing Tube feeds via PEG tube going well without residuals Patient is getting stronger but still not able to stand and walk at this time No neuro deficits is generally weak Recurrent low-grade fever-patient placed back on Fortaz and sputum cultures-tracheal aspirate pending Objective: Vital signs in last 24 hours: Temp:  [96.4 F (35.8 C)-100.3 F (37.9 C)] 96.4 F (35.8 C) (10/27 1612) Pulse Rate:  [110-140] 110 (10/27 1612) Cardiac Rhythm:  [-] Sinus tachycardia (10/27 1200) Resp:  [13-43] 31 (10/27 1612) BP: (84-154)/(40-89) 113/65 mmHg (10/27 1420) SpO2:  [95 %-100 %] 100 % (10/27 1612) FiO2 (%):  [28 %] 28 % (10/27 1612) Weight:  [89 lb 15.2 oz (40.8 kg)] 89 lb 15.2 oz (40.8 kg) (10/27 0520)  Hemodynamic parameters for last 24 hours:  stable  Intake/Output from previous day: 10/26 0701 - 10/27 0700 In: 2281 [I.V.:551; NG/GT:1250; IV Piggyback:250] Out: 2578 [Urine:2575; Stool:3] Intake/Output this shift: Total I/O In: 1770 [I.V.:1370; NG/GT:350; IV Piggyback:50] Out: 1405 [Urine:1405]  Exam  Up in chair comfortable and interactive Scattered rhonchi Heart rhythm regular without murmur Extremities warm without edema Abdomen soft  Lab Results:  Recent Labs  03/03/15 0418 03/04/15 0445  WBC 9.5 8.1  HGB 9.9* 9.9*  HCT 31.3* 32.1*  PLT 315 342   BMET:  Recent Labs  03/03/15 0418 03/04/15 0445  NA 137 139  K 3.6 3.6  CL 101 103  CO2 26 27  GLUCOSE 184* 177*  BUN 38* 42*  CREATININE 1.00 1.01*  CALCIUM 8.4* 8.8*    PT/INR: No results for input(s): LABPROT, INR in the last 72 hours. ABG    Component Value Date/Time   PHART 7.438 02/26/2015 0845   HCO3 28.1* 02/26/2015 0845   TCO2 24 02/26/2015 1554    ACIDBASEDEF 1.6 02/23/2015 0420   O2SAT 70.9 02/28/2015 0511   CBG (last 3)   Recent Labs  03/04/15 0357 03/04/15 0816 03/04/15 1205  GLUCAP 159* 151* 170*    Assessment/Plan: S/P Procedure(s) (LRB): STERNAL CLOSURE (N/A) TRANSESOPHAGEAL ECHOCARDIOGRAM (TEE) (N/A) Continue current care Patient is to remain in ICU until she can stand walk with some assistance than she can be moved to step down Continue Fortaz and followup sputum cultures.  LOS: 37 days    Kathlee Nationseter Van Trigt III 03/04/2015

## 2015-03-04 NOTE — Progress Notes (Signed)
Trach Care Progression Note   Patient Details Name: Breanna Alvarez MRN: [id number] DOB: [birth year] Today's Date: 03/04/2015   Tracheostomy Assessment    Tracheostomy Shiley 6 mm Cuffed (Active)  Status Secured 03/04/2015 12:43 PM  Site Assessment Clean;Dry 03/04/2015 12:43 PM  Site Care Protective barrier to skin 03/04/2015  4:00 AM  Inner Cannula Care Changed/new 03/04/2015 12:00 AM  Ties Assessment Dry;Clean;Secure 03/04/2015 12:43 PM  Cuff pressure (cm) 0 cm 03/04/2015 12:43 PM  Emergency Equipment at bedside Yes 03/04/2015 12:43 PM     Care Needs     Respiratory Therapy O2 Device: Tracheostomy Collar FiO2 (%): 28 % SpO2: 100 % Education: Not applicable Follow up recommendations:  (follow for progression)    Speech Language Pathology  SLP chart review complete: Patient not ready for SLP services (lots of secretions per RN. Not ready) Patient may use Passy-Muir Speech Valve: During all waking hours (remove during sleep), During all therapies with supervision PMSV Supervision: Intermittent   Physical Therapy PT Recommendation/Assessment: Patient needs continued PT services Follow Up Recommendations: SNF PT equipment: Other (comment) (TBA)    Occupational Therapy OT Recommendation/Assessment: Patient needs continued OT Services Follow Up Recommendations: SNF OT Equipment: Other (comment) (defer to next venue)    Nutritional Patient's Current Diet: Tube feeding Tube Feeding: Vital AF 1.2 Cal Tube Feeding Frequency: Continuous Tube Feeding Strength: Full strength SLP Diet Recommendations: NPO    Case Management/Social Work   PT recommends SNF             Breanna Alvarez, Breanna Alvarez 03/04/2015, 2:10 PM

## 2015-03-04 NOTE — Progress Notes (Signed)
eLink Physician-Brief Progress Note Patient Name: Breanna QuarryMaria T Alvarez DOB: 11/09/1950 MRN: [id number]   Date of Service  03/04/2015  HPI/Events of Note  Mucus plugs/ resp distress better p suctioning   eICU Interventions  D/c glycopyrlate/ try mucomyst/alb and back on vent for tonight     Intervention Category Major Interventions: Respiratory failure - evaluation and management  Sandrea HughsMichael Eivin Mascio 03/04/2015, 4:58 PM

## 2015-03-04 NOTE — Progress Notes (Signed)
Patient seen for trach team consult.  No education given at this time.  All needed equipment at bedside. Will continue to follow.

## 2015-03-04 NOTE — Progress Notes (Signed)
Occupational Therapy Treatment Patient Details Name: Breanna Alvarez MRN: [id number] DOB: 29-Oct-1950 Today's Date: 03/04/2015    History of present illness Breanna Alvarez is a 64 y.o. female who has PMHx of CAD, CHF and had an NSTEMI in ealy 2016, present to ED with ongoing CP.  She is now s/p CABG as of 9/27 with Centrimag placement, 9/30 Centrimag removal, 10/3 sternal closure, Wound I and D 10/4, failed extubation 10/7, 10/10 trached, ?aspiration on 10/13 and worsening status.     First attempt wean to [facility]. 10/19.   OT comments  Pt very fatigued this afternoon, but was able to participate in minimal bil. UE AAROM/exercise and work on trunk control EOB   Follow Up Recommendations  SNF    Equipment Recommendations  None recommended by OT    Recommendations for Other Services      Precautions / Restrictions Precautions Precautions: Fall;Sternal Restrictions Weight Bearing Restrictions: No       Mobility Bed Mobility               General bed mobility comments: worked from chair level  Transfers Overall transfer level: Needs assistance Equipment used: Ambulation equipment used Transfers: Sit to/from Stand Sit to Stand: Max assist;+2 physical assistance (x4)         General transfer comment: cues for hand placement.  knee and truncal support    Balance Overall balance assessment: Needs assistance Sitting-balance support: No upper extremity supported Sitting balance-Leahy Scale: Poor Sitting balance - Comments: sitting in total >15.  Short time without UE assist, but with tendency to list posteriorly.   Pt working on stabilizing for longer periods of time with UE assist   Standing balance support: During functional activity;Bilateral upper extremity supported Standing balance-Leahy Scale: Poor Standing balance comment: 4 standing trials working on [employer] with cues for hand placement, getting a good foundation.  Assist coming forward and support at knees and  trunk to stand.  Graded assist to stand.  Pt could weakly maintain stance with knees guarded only, but needed truncal and hip support to attain upright stance.                   ADL Overall ADL's : Needs assistance/impaired     Grooming: Wash/dry face;Oral care;Maximal assistance;Sitting                                        Vision                     Perception     Praxis      Cognition   Behavior During Therapy: WFL for tasks assessed/performed Overall Cognitive Status: Difficult to assess Area of Impairment: Attention   Current Attention Level: Sustained    Following Commands: Follows one step commands inconsistently Safety/Judgement: Decreased awareness of deficits Awareness: Intellectual Problem Solving: Slow processing      Extremity/Trunk Assessment               Exercises Other Exercises Other Exercises: Pt performed 8 reps each UE AAROM shoulder flexion with mod cues.  Pt fatigued and required multiple rest breaks.  Pt moved to EOC, requires mod A/faciliation for upright sitting/trunk extension    Shoulder Instructions       General Comments      Pertinent Vitals/ Pain       Pain Assessment: Faces Faces Pain Scale:  No hurt Pain Location: sternum Pain Descriptors / Indicators: Sore;Grimacing Pain Intervention(s): Monitored during session;Repositioned  Home Living                                          Prior Functioning/Environment              Frequency Min 3X/week     Progress Toward Goals  OT Goals(current goals can now be found in the care plan section)  Progress towards OT goals: Progressing toward goals (slowly )  Acute Rehab OT Goals Patient Stated Goal: not stated OT Goal Formulation: Patient unable to participate in goal setting Time For Goal Achievement: 03/15/15 Potential to Achieve Goals: Good ADL Goals Additional ADL Goal #1: Pt will actively participate in 20  therapeutic activity with no more than 2 rest breaks to increase activity tolerance/endurance as needed for ADLs  Additional ADL Goal #2: Pt will sit unuspported x 5 mins with min guard assist.   Plan Discharge plan remains appropriate;Frequency needs to be updated    Co-evaluation                 End of Session Equipment Utilized During Treatment: Oxygen   Activity Tolerance Patient limited by fatigue   Patient Left in chair;with call bell/phone within reach   Nurse Communication          Time: [id number] OT Time Calculation (min): 14 min  Charges: OT General Charges $OT Visit: 1 Procedure OT Treatments $Therapeutic Exercise: 8-22 mins  Perl Folmar M 03/04/2015, 3:21 PM

## 2015-03-04 NOTE — Progress Notes (Signed)
[facility] ICU Electrolyte Replacement Protocol  Patient Name: Breanna Alvarez DOB: 1951/01/24 MRN: [id number]  Date of Service  03/04/2015   HPI/Events of Note    Recent Labs Lab 02/28/15 0457 02/28/15 0614 03/02/15 0401 03/03/15 0418 03/04/15 0445  NA 140 141 142 137 139  K 6.7* 3.6 3.3* 3.6 3.6  CL 105 104 106 101 103  CO2 $Re'28 27 26 26 27  'PEH$ GLUCOSE 129* 131* 161* 184* 177*  BUN 35* 37* 39* 38* 42*  CREATININE 1.21* 1.23* 1.08* 1.00 1.01*  CALCIUM 8.1* 8.5* 8.5* 8.4* 8.8*    Estimated Creatinine Clearance: 37.2 mL/min (by C-G formula based on Cr of 1.01).  Intake/Output      10/26 0701 - 10/27 0700   I.V. (mL/kg) 491 (12)   Other 230   NG/GT 1100   IV Piggyback 250   Total Intake(mL/kg) 2071 (50.8)   Urine (mL/kg/hr) 2375 (2.4)   Stool 3 (0)   Total Output 2378   Net -307        - I/O DETAILED x24h    Total I/O In: 800 [I.V.:180; Other:120; NG/GT:450; IV Piggyback:50] Out: 855 [Urine:855] - I/O THIS SHIFT    ASSESSMENT   eICURN Interventions   Electrolyte protocol criteria met.  Replace per protocol.  MD notified   ASSESSMENT: MAJOR ELECTROLYTE    Lorene Dy 03/04/2015, 5:45 AM

## 2015-03-05 ENCOUNTER — Inpatient Hospital Stay (HOSPITAL_COMMUNITY): Payer: Medicaid Other

## 2015-03-05 LAB — GLUCOSE, CAPILLARY
GLUCOSE-CAPILLARY: 176 mg/dL — AB (ref 65–99)
GLUCOSE-CAPILLARY: 85 mg/dL (ref 65–99)
GLUCOSE-CAPILLARY: 146 mg/dL — AB (ref 65–99)
Glucose-Capillary: 137 mg/dL — ABNORMAL HIGH (ref 65–99)
Glucose-Capillary: 141 mg/dL — ABNORMAL HIGH (ref 65–99)
Glucose-Capillary: 188 mg/dL — ABNORMAL HIGH (ref 65–99)

## 2015-03-05 LAB — CBC
HCT: 30.1 % — ABNORMAL LOW (ref 36.0–46.0)
Hemoglobin: 9.4 g/dL — ABNORMAL LOW (ref 12.0–15.0)
MCH: 27.6 pg (ref 26.0–34.0)
MCHC: 31.2 g/dL (ref 30.0–36.0)
MCV: 88.5 fL (ref 78.0–100.0)
Platelets: 335 10*3/uL (ref 150–400)
RBC: 3.4 MIL/uL — ABNORMAL LOW (ref 3.87–5.11)
RDW: 17.5 % — ABNORMAL HIGH (ref 11.5–15.5)
WBC: 7.5 10*3/uL (ref 4.0–10.5)

## 2015-03-05 LAB — BASIC METABOLIC PANEL
Anion gap: 14 (ref 5–15)
BUN: 52 mg/dL — ABNORMAL HIGH (ref 6–20)
CO2: 26 mmol/L (ref 22–32)
Calcium: 9.2 mg/dL (ref 8.9–10.3)
Chloride: 103 mmol/L (ref 101–111)
Creatinine, Ser: 1.22 mg/dL — ABNORMAL HIGH (ref 0.44–1.00)
GFR calc Af Amer: 54 mL/min — ABNORMAL LOW (ref >60)
GFR calc non Af Amer: 46 mL/min — ABNORMAL LOW (ref >60)
Glucose, Bld: 192 mg/dL — ABNORMAL HIGH (ref 65–99)
Potassium: 3.7 mmol/L (ref 3.5–5.1)
Sodium: 143 mmol/L (ref 135–145)

## 2015-03-05 MED ORDER — DIGOXIN 0.0625 MG HALF TABLET
0.0625 mg | ORAL_TABLET | Freq: Every day | ORAL | Status: DC
  Administered 2015-03-05 – 2015-03-15 (×11): 0.0625 mg via ORAL
  Filled 2015-03-05 (×11): qty 1

## 2015-03-05 NOTE — Progress Notes (Signed)
Speech Language Pathology Treatment: Breanna BowPassy Muir Speaking valve  Patient Details Name: Breanna Alvarez MRN: [id number] DOB: 03/02/1951 Today's Date: 03/05/2015 Time: 0900-0920 SLP Time Calculation (min) (ACUTE ONLY): 20 min  Assessment / Plan / Recommendation Clinical Impression  Pt seen after RT put pt back on ATC after night on vent. SLP deflated cuff and placed valve. Pt able to follow min verbal cues to clear secretions orally with hard cough and throat clear. No prolonged coughing or change in vitals and no need for tracheal suction.  Cuff deflation and PMSV placement help pt manage oral secretions. Pt motivated to speak at phrase length when discussing religion. She is very faithful and stated she would like a visit from the chaplain. Discussed with RN. Tolerance still adequate for intermittent but close supervision. Will f/u next week for possible assessment of swallowing if pt continues to progress.    HPI     Pertinent Vitals  NA  SLP Plan  Continue with current plan of care    Recommendations Diet recommendations: NPO      Patient may use Passy-Muir Speech Valve: During all waking hours (remove during sleep);During all therapies with supervision PMSV Supervision: Intermittent       Oral Care Recommendations: Oral care QID Follow up Recommendations: Skilled Nursing facility Plan: Continue with current plan of care    GO   [facility] PLLCBonnie Rick Warnick, MA CCC-SLP [phone]   Breanna Alvarez, Breanna Alvarez 03/05/2015, 10:29 AM

## 2015-03-05 NOTE — Care Management Note (Signed)
Case Management Note  Patient Details  Name: Breanna Alvarez MRN: [id number] Date of Birth: 1950/08/25  Subjective/Objective:    Received information that spouse was planning to take pt home.  When talking with him, he now states that his preference is for pt to go to facility [place] - stated he thought she would need to go to facility [place] of state.  Explained difference in trach vs vent facility, and per RN, pt is doing well on trach collar and should not need to be placed back on vent.  Spouse again states he would prefer pt go to facility [place] - notified CSW.                             Expected Discharge Plan:  Skilled Nursing Facility  In-House Referral:  Clinical Social Work  Discharge planning Services  CM Consult  Status of Service:  In process, will continue to follow  Magdalene RiverMayo, Squire Withey T, RN 03/05/2015, 3:28 PM

## 2015-03-05 NOTE — Progress Notes (Signed)
RT placed pt on 28% ATC. Patient tolerating well at this time. No complications. Vital signs stable. RT will continue to monitor.

## 2015-03-05 NOTE — Progress Notes (Signed)
25 Days Post-Op Procedure(s) (LRB): STERNAL CLOSURE (N/A) TRANSESOPHAGEAL ECHOCARDIOGRAM (TEE) (N/A) Subjective: Patient required placement back on ventilator because of increased secretions and tachypnea. It been previously off ventilator for 48 hours Treated with extra dose of Lasix, x-ray this morning improved Tube feeds going well without evidence of aspiration Generally weak and unable to walk but no focal motor deficits Recent fever has resolved on IV Fortaz with cultures negative to date  Plan to mobilize patient out of bed today and progress to tracheostomy trials. Persistent sinus tachycardia managed with digoxin and beta blocker with care of dosing because patient's ischemic cardiomyopathy  Objective: Vital signs in last 24 hours: Temp:  [96.4 F (35.8 C)-99.4 F (37.4 C)] 99.1 F (37.3 C) (10/28 0809) Pulse Rate:  [109-132] 119 (10/28 0700) Cardiac Rhythm:  [-] Sinus tachycardia (10/28 0400) Resp:  [14-42] 17 (10/28 0700) BP: (89-150)/(47-84) 107/54 mmHg (10/28 0700) SpO2:  [97 %-100 %] 100 % (10/28 0700) FiO2 (%):  [28 %-40 %] 40 % (10/28 0420) Weight:  [88 lb 6.5 oz (40.1 kg)] 88 lb 6.5 oz (40.1 kg) (10/28 0600)  Hemodynamic parameters for last 24 hours:   sinus tachycardia afebrile  Intake/Output from previous day: 10/27 0701 - 10/28 0700 In: 2930 [I.V.:1630; NG/GT:1200; IV Piggyback:100] Out: 2295 [Urine:2295] Intake/Output this shift:    Exam   Patient resting comfortably in bed Sinus rhythm Breath sounds clear Extremities warm Abdomen soft Sternotomy incision clean and dry with staples intact  Lab Results:  Recent Labs  03/04/15 0445 03/05/15 0410  WBC 8.1 7.5  HGB 9.9* 9.4*  HCT 32.1* 30.1*  PLT 342 335   BMET:  Recent Labs  03/04/15 0445 03/05/15 0410  NA 139 143  K 3.6 3.7  CL 103 103  CO2 27 26  GLUCOSE 177* 192*  BUN 42* 52*  CREATININE 1.01* 1.22*  CALCIUM 8.8* 9.2    PT/INR: No results for input(s): LABPROT, INR in the  last 72 hours. ABG    Component Value Date/Time   PHART 7.438 02/26/2015 0845   HCO3 28.1* 02/26/2015 0845   TCO2 24 02/26/2015 1554   ACIDBASEDEF 1.6 02/23/2015 0420   O2SAT 70.9 02/28/2015 0511   CBG (last 3)   Recent Labs  03/04/15 2002 03/04/15 2335 03/05/15 0400  GLUCAP 123* 97 176*    Assessment/Plan: S/P Procedure(s) (LRB): STERNAL CLOSURE (N/A) TRANSESOPHAGEAL ECHOCARDIOGRAM (TEE) (N/A) Progressed to tracheostomy weaning  continue Fortaz Continue daily Lasix 40 mg via tube   LOS: 38 days    Kathlee Nationseter Van Trigt III 03/05/2015

## 2015-03-05 NOTE — Progress Notes (Signed)
Patient's husband refusing SNF placement and requesting for patient to go home with him, his mother, and his son as caregivers.He stated that he cared for his dad at home when he had cancer until he died and he was incontinent and he would carry him to the bathtub to clean him. As he came in the room his wife was sitting in the chair and had stooled. I suggested he help clean her up and he did not hesitate and jumped right in to assist. Began education for  [facility] and peg care.  He still has no hesitations. Also explained to him that everyone that came to care for her would need to "care" for her, not just sit with her. He said that everyone would be able to do that. I strongly encouraged SNF placement as she would be able to receive more therapies there.

## 2015-03-05 NOTE — Progress Notes (Signed)
PULMONARY / CRITICAL CARE MEDICINE   Name: Breanna Alvarez MRN: [id number] DOB: 1951-01-22    ADMISSION DATE:  01/26/2015 CONSULTATION DATE:  02/09/2105  REFERRING MD :  Donata ClayVan Trigt   CHIEF COMPLAINT:  Vent management   INITIAL PRESENTATION:  64 yo female admitted with chest pain from CAD.  She had CABG 02/01/15 >> post-op complicated by cardiogenic shock s/p centrimag placement and removal, ischemic cardiomyopathy with EF 20%, open sternum.  She remained on vent post-op and PCCM consulted to assist with vent weaning.  STUDIES:  9/21 Echo >> EF 20-25%, mod MR, PA press 51 mmHg 10/15 Echo >> EF 25 to 30%, PAS 36 mmHg  SIGNIFICANT EVENTS: 09/26 CABG x 4, placement of centrimag L ventricular assistive device  09/30 removal centrimag  10/03 sternal closure 10/09 Hg drop overnight by 1 gm and transfusion ordered 10/10 Trach by DF 10/12 Off precedex 10/13 possible aspiration event; TNA started 10/14 difficulty with agitation 10/16 off pressors 10/17 off TNA 10/18 Fever 101, transfuse 1 unit PRBC 10/20 start trach collar trials; off milrinone; G tube placed 02/28/15 ;Tolerating trach collar at daytime. On vent at night. 03/01/15 :atc x daytime. Sitting in chair. RN who had her before weekend reports no issues. More alert. Nods to simple Qs 03/03/15 Remains on TC   SUBJECTIVE/OVERNIGHT/INTERVAL HX 03/05/15 -  Now on PMV. Had to go back on vent briefly yesterday. Getting daily Nocturnal Vent per RN. Sitting in chair. Apparently no one able to get hold of husband to arrange for dispo VITAL SIGNS: Temp:  [96.4 F (35.8 C)-99.4 F (37.4 C)] 99.1 F (37.3 C) (10/28 0809) Pulse Rate:  [106-132] 106 (10/28 1100) Resp:  [14-42] 35 (10/28 1100) BP: (89-150)/(47-84) 117/59 mmHg (10/28 1100) SpO2:  [97 %-100 %] 100 % (10/28 1100) FiO2 (%):  [28 %-40 %] 28 % (10/28 0840) Weight:  [40.1 kg (88 lb 6.5 oz)] 40.1 kg (88 lb 6.5 oz) (10/28 0600)   HEMODYNAMICS:     VENTILATOR SETTINGS: Vent  Mode:  [-] PRVC FiO2 (%):  [28 %-40 %] 28 % Set Rate:  [14 bmp] 14 bmp Vt Set:  [400 mL] 400 mL PEEP:  [5 cmH20] 5 cmH20 Plateau Pressure:  [18 cmH20-21 cmH20] 21 cmH20  INTAKE / OUTPUT:  Intake/Output Summary (Last 24 hours) at 03/05/15 1109 Last data filed at 03/05/15 1100  Gross per 24 hour  Intake   1730 ml  Output   2115 ml  Net   -385 ml   PHYSICAL EXAMINATION: General: Awake, No distress. Deconditioned, was in a chair Neuro: No focal deficits HEENT: Trach site clean Cardiovascular: RRR, No MRG Lungs: Coarse , secretions are an issue Abdomen: soft, non tender, G tube site clean Musculoskeletal: no edema  LABS:`PULMONARY  Recent Labs Lab 02/26/15 1554 02/27/15 0400 02/28/15 0511  TCO2 24  --   --   O2SAT  --  78.1 70.9    CBC  Recent Labs Lab 03/03/15 0418 03/04/15 0445 03/05/15 0410  HGB 9.9* 9.9* 9.4*  HCT 31.3* 32.1* 30.1*  WBC 9.5 8.1 7.5  PLT 315 342 335     CHEMISTRY  Recent Labs Lab 02/28/15 0614 03/02/15 0401 03/03/15 0418 03/04/15 0445 03/05/15 0410  NA 141 142 137 139 143  K 3.6 3.3* 3.6 3.6 3.7  CL 104 106 101 103 103  CO2 27 26 26 27 26   GLUCOSE 131* 161* 184* 177* 192*  BUN 37* 39* 38* 42* 52*  CREATININE 1.23* 1.08* 1.00 1.01* 1.22*  CALCIUM 8.5* 8.5* 8.4* 8.8* 9.2   Estimated Creatinine Clearance: 30.3 mL/min (by C-G formula based on Cr of 1.22).  LIVER  Recent Labs Lab 02/27/15 0436 02/28/15 0457 03/01/15 0815 03/02/15 0401 03/03/15 0418  AST 39 43*  --  37 35  ALT 53 52  --  55* 58*  ALKPHOS 117 106  --  113 135*  BILITOT 2.0* 1.7*  --  1.5* 1.7*  PROT 6.5 6.4*  --  6.5 6.8  ALBUMIN 2.2* 2.1*  --  2.3* 2.4*  INR  --   --  1.31  --   --    ENDOCRINE CBG (last 3)   Recent Labs  03/04/15 2335 03/05/15 0400 03/05/15 0807  GLUCAP 97 176* 141*    Iron/TIBC/Ferritin/ %Sat    Component Value Date/Time   IRON 19* 02/22/2015 0900   TIBC 143* 02/22/2015 0900   FERRITIN 1025* 02/22/2015 0900   IRONPCTSAT  13 02/22/2015 0900    IMAGING x48h Dg Chest Port 1 View  03/05/2015  CLINICAL DATA:  Status CABG EXAM: PORTABLE CHEST 1 VIEW COMPARISON:  Portable chest x-ray of March 04, 2015 FINDINGS: The lungs are well-expanded. The pulmonary interstitial markings remain increased bilaterally. There is no alveolar infiltrate. There is no pleural effusion or pneumothorax. The pulmonary vascularity is less engorged today. The cardiac silhouette is top-normal in size. There are 7 intact sternal wires. The tracheostomy appliance tip projects at the inferior margin of the clavicular heads. The left-sided PICC line tip projects over the midportion of the SVC. IMPRESSION: Slight interval improvement in the pulmonary vascularity today. Mild pulmonary interstitial edema persists. Electronically Signed   By: David  [place] M.D.   On: 03/05/2015 07:48   Dg Chest Port 1 View  03/04/2015  CLINICAL DATA:  Shortness of breath.  Respiratory failure. EXAM: PORTABLE CHEST 1 VIEW COMPARISON:  03/02/2015 FINDINGS: Tracheostomy tube overlies the airway. Left PICC remains in place with tip overlying the cavoatrial junction. Patient is rotated to the right, partially limiting evaluation of the mediastinal structures. Cardiac silhouette is accentuated by rotation and portable AP technique. Bilateral interstitial and patchy airspace opacities have slightly increased. There is likely a small right pleural effusion. No pneumothorax is identified. Sequelae of prior CABG are again identified. IMPRESSION: Slight worsening of lung opacities suggestive of mildly increased pulmonary edema. Small right pleural effusion. Electronically Signed   By: Sebastian Ache M.D.   On: 03/04/2015 08:12    ASSESSMENT / PLAN:  PULMONARY ETT 9/26 >> 10/07 Trach (DF) 10/10 >> A: Acute on chronic respiratory failure 2nd to acute pulmonary edema/cardiogenic shock after CABG, deconditioning, and aspiration pneumonia. Failure to wean from vent s/p  tracheostomy. Edema componen   - slow progress. Needed vent 03/04/15 day times  P:   TC day time + nocutrnal vent needeed Cuff down, OKay by me unless secretions thicken and increase further Would prefer continued PMV attempts with supervision Trach care, bronchial hygiene Prn albuterol Keep cuff trach until off vent 72 hr then can consider 6 cuff less but NOT 4 with secretion noted pcxr even with penetration difference , edema better with lasix  CARDIOVASCULAR Lt PICC 10/03 >>  Lt brachial aline 10/13 >>  A: CAD s/p CABG Acute on chronic systolic heart failure with ischemic CM. Cardiogenic shock. HLD >> reported muscle aches with Crestor.  P:  Continue ASA, lopressor, NTG patch Lasix per TCTS and cardiology, improved with this  RENAL A: AKI in setting of cardiogenic shock >> baseline creatinine  0.93 from 10/01/14 Hypokalemia.   - improving with lasix P:   Maintain lasix per cvts   GASTROINTESTINAL A: Protein calorie malnutrition. Elevated LFT's >> trending down. Dysphagia s/p G tube by IR 10/20 P:   Tube feeds tolerated assess last BM Protonix for SUP  HEMATOLOGIC A: Anemia of critical illness. Slight hemoconcentration lasix P:  Lovenox we should restart?? Lasix Limit phlebotomy when able  INFECTIOUS A: Aspiration pneumonia 10/13. P:   Zosyn 10/12 >> 10/21.  Got 10 days ceftaz 10/26>>>  Follow sputum  Blood 10/18 >> NGTD  ENDOCRINE A: Hyperglycemia. P:   SSI  NEUROLOGIC A: Agitated delirium. Deconditioning.   - slowly improving P:   Continue seroquel, klonopin PRN versed RASS goal 0 PT/OT assessment  DISPOSITION: Would be good candidate for LTAC >> not eligible with Medicaid.  Disposition will depend on how well she weans off vent..      Dr. Kalman Shan, M.D., F.C.C.P Pulmonary and Critical Care Medicine Staff Physician [facility]  Pulmonary and Critical Care Pager: [phone], If no answer or  between  15:00h - 7:00h: call [phone]  03/05/2015 11:10 AM

## 2015-03-05 NOTE — Clinical Social Work Note (Signed)
Clinical Social Work Assessment  Patient Details  Name: Breanna Alvarez MRN: [id number] Date of Birth: 29-Aug-1950  Date of referral:  03/05/15               Reason for consult:  Facility Placement                Permission sought to share information with:  Family Supports Permission granted to share information::  Yes, Verbal Permission Granted  Name::      (husband Breanna Alvarez)  Agency::     Relationship::     Contact Information:     Housing/Transportation Living arrangements for the past 2 months:  Single Family Home Source of Information:  Patient, Spouse Patient Interpreter Needed:  None Criminal Activity/Legal Involvement Pertinent to Current Situation/Hospitalization:    Significant Relationships:    Lives with:  Spouse Do you feel safe going back to the place where you live?  Yes (patient wishes to return home) Need for family participation in patient care:  Yes (Comment) (patient request)  Care giving concerns:  Husband Breanna Alvarez is requesting for patient to return home at time of discharge.  Breanna Alvarez is requesting medical staff to provide education regarding possible home needs.     Social Worker assessment / plan:  Patient is in the process of weaning off of the vent and had several successes being off the vent for hours at a time.  Patient was back on the vent last evening.  At this time, it is unclear whether the patient will need vent or trach at time of discharge.  CSW reviewed disposition with husband, Breanna Alvarez who is requesting for patient to return home.  CSW reviewed this possibility with unit RNCM who reports this could be a viable possibility depending on patient's needs.  Breanna Alvarez is requesting medical staff to provide education regarding trach care for spouse to determine feasibility re: patient returning home. At this time, Breanna Alvarez has refused SNF for patient until he is sure he is not able to manage her care himself. Of note, Breanna Alvarez reports patient will have 24-hour care  if able to return home.  CSW has requested the assistance of RN staff to relay full scope of home care to [name] for him to make a concrete decision regarding patient care.  Employment status:    Insurance information:  [place] Recommendations:  Skilled Nursing Facility Information / Referral to community resources:   (no referrals made as of this time. Husband requests to take patient home and SNF search not be started at this time due to possible trach SNF vs. vent SNF)  Patient/Family's Response to care:  Husband refusing SNF at this time  Patient/Family's Understanding of and Emotional Response to Diagnosis, Current Treatment, and Prognosis:  It is not clear to this CSW at this time, to whether patient's husband has a realistic idea of patient's needs.  Per RN staff, patient husband rarely at bedside to see the extent of patient's care.    Emotional Assessment Appearance:  Appears stated age Attitude/Demeanor/Rapport:   (appropriate) Affect (typically observed):  Accepting Orientation:  Oriented to Self, Oriented to Place, Oriented to  Time, Oriented to Situation Alcohol / Substance use:  Not Applicable Psych involvement (Current and /or in the community):  No (Comment)  Discharge Needs  Concerns to be addressed:  No discharge needs identified Readmission within the last 30 days:  No Current discharge risk:  None Barriers to Discharge:  No Barriers Identified   Breanna Alvarez, Breanna Gibler C,  LCSW 03/05/2015, 12:17 PM

## 2015-03-05 NOTE — Progress Notes (Signed)
TCTS BRIEF SICU PROGRESS NOTE  25 Days Post-Op  S/P Procedure(s) (LRB): STERNAL CLOSURE (N/A) TRANSESOPHAGEAL ECHOCARDIOGRAM (TEE) (N/A)   Stable day  Plan: Continue current plan  Purcell Nailslarence H Owen, MD 03/05/2015 7:22 PM

## 2015-03-06 LAB — CBC
HCT: 30.8 % — ABNORMAL LOW (ref 36.0–46.0)
Hemoglobin: 9.8 g/dL — ABNORMAL LOW (ref 12.0–15.0)
MCH: 28.5 pg (ref 26.0–34.0)
MCHC: 31.8 g/dL (ref 30.0–36.0)
MCV: 89.5 fL (ref 78.0–100.0)
Platelets: 347 K/uL (ref 150–400)
RBC: 3.44 MIL/uL — ABNORMAL LOW (ref 3.87–5.11)
RDW: 17.7 % — ABNORMAL HIGH (ref 11.5–15.5)
WBC: 7.6 K/uL (ref 4.0–10.5)

## 2015-03-06 LAB — GLUCOSE, CAPILLARY
GLUCOSE-CAPILLARY: 102 mg/dL — AB (ref 65–99)
GLUCOSE-CAPILLARY: 178 mg/dL — AB (ref 65–99)
Glucose-Capillary: 107 mg/dL — ABNORMAL HIGH (ref 65–99)
Glucose-Capillary: 128 mg/dL — ABNORMAL HIGH (ref 65–99)
Glucose-Capillary: 157 mg/dL — ABNORMAL HIGH (ref 65–99)
Glucose-Capillary: 172 mg/dL — ABNORMAL HIGH (ref 65–99)

## 2015-03-06 LAB — BASIC METABOLIC PANEL WITH GFR
Anion gap: 15 (ref 5–15)
BUN: 55 mg/dL — ABNORMAL HIGH (ref 6–20)
CO2: 26 mmol/L (ref 22–32)
Calcium: 9.4 mg/dL (ref 8.9–10.3)
Chloride: 104 mmol/L (ref 101–111)
Creatinine, Ser: 1.09 mg/dL — ABNORMAL HIGH (ref 0.44–1.00)
GFR calc Af Amer: >60 mL/min
GFR calc non Af Amer: 53 mL/min — ABNORMAL LOW
Glucose, Bld: 111 mg/dL — ABNORMAL HIGH (ref 65–99)
Potassium: 3.3 mmol/L — ABNORMAL LOW (ref 3.5–5.1)
Sodium: 145 mmol/L (ref 135–145)

## 2015-03-06 LAB — DIGOXIN LEVEL: Digoxin Level: 0.4 ng/mL — ABNORMAL LOW (ref 0.8–2.0)

## 2015-03-06 NOTE — Progress Notes (Signed)
      [address]       [place] [id number]             [phone]        CARDIOTHORACIC SURGERY PROGRESS NOTE   R26 Days Post-Op Procedure(s) (LRB): STERNAL CLOSURE (N/A) TRANSESOPHAGEAL ECHOCARDIOGRAM (TEE) (N/A)  Subjective: Looks good.  Sitting up on T collar.  Denies SOB or pain.  Objective: Vital signs: BP Readings from Last 1 Encounters:  03/06/15 126/71   Pulse Readings from Last 1 Encounters:  03/06/15 128   Resp Readings from Last 1 Encounters:  03/06/15 19   Temp Readings from Last 1 Encounters:  03/06/15 98.7 F (37.1 C) Oral    Hemodynamics:    Physical Exam:  Rhythm:   sinus  Breath sounds: Coarse   Heart sounds:  RRR  Incisions:  Dressing dry, intact  Abdomen:  Soft, non-distended, non-tender  Extremities:  Warm, well-perfused    Intake/Output from previous day: 10/28 0701 - 10/29 0700 In: 1430 [I.V.:230; NG/GT:1150; IV Piggyback:50] Out: 1140 [Urine:1140] Intake/Output this shift: Total I/O In: 150 [I.V.:20; Other:30; NG/GT:100] Out: 0   Lab Results:  CBC: Recent Labs  03/05/15 0410 03/06/15 0415  WBC 7.5 7.6  HGB 9.4* 9.8*  HCT 30.1* 30.8*  PLT 335 347    BMET:  Recent Labs  03/05/15 0410 03/06/15 0415  NA 143 145  K 3.7 3.3*  CL 103 104  CO2 26 26  GLUCOSE 192* 111*  BUN 52* 55*  CREATININE 1.22* 1.09*  CALCIUM 9.2 9.4     PT/INR:  No results for input(s): LABPROT, INR in the last 72 hours.  CBG (last 3)   Recent Labs  03/05/15 2355 03/06/15 0409 03/06/15 0812  GLUCAP 128* 102* 178*    ABG    Component Value Date/Time   PHART 7.438 02/26/2015 0845   PCO2ART 41.9 02/26/2015 0845   PO2ART 94.0 02/26/2015 0845   HCO3 28.1* 02/26/2015 0845   TCO2 24 02/26/2015 1554   ACIDBASEDEF 1.6 02/23/2015 0420   O2SAT 70.9 02/28/2015 0511    CXR: n/a  Assessment/Plan: S/P Procedure(s) (LRB): STERNAL CLOSURE (N/A) TRANSESOPHAGEAL ECHOCARDIOGRAM (TEE) (N/A)  Clinically  stable Maintaining NSR w/ stable BP Tolerating T-collar well  Strength improving, although very slowly  Purcell Nailslarence H Krisanne Lich, MD 03/06/2015 11:07 AM

## 2015-03-06 NOTE — Progress Notes (Signed)
TCTS BRIEF SICU PROGRESS NOTE  26 Days Post-Op  S/P Procedure(s) (LRB): STERNAL CLOSURE (N/A) TRANSESOPHAGEAL ECHOCARDIOGRAM (TEE) (N/A)   Stable day  Plan: Continue current plan  Purcell Nailslarence H Owen, MD 03/06/2015 5:11 PM

## 2015-03-07 DIAGNOSIS — E876 Hypokalemia: Secondary | ICD-10-CM

## 2015-03-07 DIAGNOSIS — E440 Moderate protein-calorie malnutrition: Secondary | ICD-10-CM

## 2015-03-07 DIAGNOSIS — T17998A Other foreign object in respiratory tract, part unspecified causing other injury, initial encounter: Secondary | ICD-10-CM

## 2015-03-07 LAB — GLUCOSE, CAPILLARY
GLUCOSE-CAPILLARY: 101 mg/dL — AB (ref 65–99)
GLUCOSE-CAPILLARY: 144 mg/dL — AB (ref 65–99)
GLUCOSE-CAPILLARY: 153 mg/dL — AB (ref 65–99)
GLUCOSE-CAPILLARY: 97 mg/dL (ref 65–99)
Glucose-Capillary: 134 mg/dL — ABNORMAL HIGH (ref 65–99)
Glucose-Capillary: 159 mg/dL — ABNORMAL HIGH (ref 65–99)

## 2015-03-07 LAB — CBC
HCT: 32.6 % — ABNORMAL LOW (ref 36.0–46.0)
Hemoglobin: 9.9 g/dL — ABNORMAL LOW (ref 12.0–15.0)
MCH: 27.4 pg (ref 26.0–34.0)
MCHC: 30.4 g/dL (ref 30.0–36.0)
MCV: 90.3 fL (ref 78.0–100.0)
Platelets: 369 10*3/uL (ref 150–400)
RBC: 3.61 MIL/uL — ABNORMAL LOW (ref 3.87–5.11)
RDW: 17.8 % — ABNORMAL HIGH (ref 11.5–15.5)
WBC: 7.7 10*3/uL (ref 4.0–10.5)

## 2015-03-07 LAB — BASIC METABOLIC PANEL
Anion gap: 14 (ref 5–15)
BUN: 56 mg/dL — ABNORMAL HIGH (ref 6–20)
CO2: 27 mmol/L (ref 22–32)
Calcium: 9.6 mg/dL (ref 8.9–10.3)
Chloride: 105 mmol/L (ref 101–111)
Creatinine, Ser: 1.1 mg/dL — ABNORMAL HIGH (ref 0.44–1.00)
GFR calc Af Amer: >60 mL/min (ref >60)
GFR calc non Af Amer: 53 mL/min — ABNORMAL LOW (ref >60)
Glucose, Bld: 161 mg/dL — ABNORMAL HIGH (ref 65–99)
Potassium: 3.2 mmol/L — ABNORMAL LOW (ref 3.5–5.1)
Sodium: 146 mmol/L — ABNORMAL HIGH (ref 135–145)

## 2015-03-07 MED ORDER — OXYCODONE HCL 5 MG/5ML PO SOLN
5.0000 mg | Freq: Four times a day (QID) | ORAL | Status: DC | PRN
Start: 2015-03-07 — End: 2015-03-14
  Administered 2015-03-07 – 2015-03-14 (×4): 5 mg
  Filled 2015-03-07 (×5): qty 5

## 2015-03-07 MED ORDER — ALBUTEROL SULFATE (2.5 MG/3ML) 0.083% IN NEBU
2.5000 mg | INHALATION_SOLUTION | Freq: Four times a day (QID) | RESPIRATORY_TRACT | Status: DC
  Administered 2015-03-07 – 2015-03-12 (×21): 2.5 mg via RESPIRATORY_TRACT
  Filled 2015-03-07 (×22): qty 3

## 2015-03-07 MED ORDER — POTASSIUM CHLORIDE 20 MEQ/15ML (10%) PO SOLN
40.0000 meq | Freq: Once | ORAL | Status: AC
  Administered 2015-03-07: 40 meq
  Filled 2015-03-07 (×2): qty 30

## 2015-03-07 MED ORDER — SCOPOLAMINE 1 MG/3DAYS TD PT72
1.0000 | MEDICATED_PATCH | TRANSDERMAL | Status: DC
  Administered 2015-03-07 – 2015-03-13 (×3): 1.5 mg via TRANSDERMAL
  Filled 2015-03-07 (×3): qty 1

## 2015-03-07 NOTE — Progress Notes (Signed)
      [address]       [place] [id number]             [phone]        CARDIOTHORACIC SURGERY PROGRESS NOTE   R27 Days Post-Op Procedure(s) (LRB): STERNAL CLOSURE (N/A) TRANSESOPHAGEAL ECHOCARDIOGRAM (TEE) (N/A)  Subjective: Comfortable today.  Denies pain, SOB  Objective: Vital signs: BP Readings from Last 1 Encounters:  03/07/15 150/64   Pulse Readings from Last 1 Encounters:  03/07/15 107   Resp Readings from Last 1 Encounters:  03/07/15 19   Temp Readings from Last 1 Encounters:  03/07/15 99.2 F (37.3 C) Oral    Hemodynamics:    Physical Exam:  Rhythm:   sinus  Breath sounds: Scattered rhonchi  Heart sounds:  RRR  Incisions:  Clean and dry  Abdomen:  Soft, non-distended, non-tender  Extremities:  Warm, well-perfused    Intake/Output from previous day: 10/29 0701 - 10/30 0700 In: 1800 [I.V.:230; NG/GT:1150; IV Piggyback:150] Out: 1555 [Urine:1555] Intake/Output this shift: Total I/O In: 440 [I.V.:60; Other:30; NG/GT:300; IV Piggyback:50] Out: 415 [Urine:415]  Lab Results:  CBC: Recent Labs  03/06/15 0415 03/07/15 0406  WBC 7.6 7.7  HGB 9.8* 9.9*  HCT 30.8* 32.6*  PLT 347 369    BMET:  Recent Labs  03/06/15 0415 03/07/15 0406  NA 145 146*  K 3.3* 3.2*  CL 104 105  CO2 26 27  GLUCOSE 111* 161*  BUN 55* 56*  CREATININE 1.09* 1.10*  CALCIUM 9.4 9.6     PT/INR:  No results for input(s): LABPROT, INR in the last 72 hours.  CBG (last 3)   Recent Labs  03/07/15 0014 03/07/15 0402 03/07/15 1148  GLUCAP 97 153* 159*    ABG    Component Value Date/Time   PHART 7.438 02/26/2015 0845   PCO2ART 41.9 02/26/2015 0845   PO2ART 94.0 02/26/2015 0845   HCO3 28.1* 02/26/2015 0845   TCO2 24 02/26/2015 1554   ACIDBASEDEF 1.6 02/23/2015 0420   O2SAT 70.9 02/28/2015 0511    CXR: n/a  Assessment/Plan: S/P Procedure(s) (LRB): STERNAL CLOSURE (N/A) TRANSESOPHAGEAL ECHOCARDIOGRAM (TEE) (N/A)  Clinically  stable Maintaining NSR w/ stable BP Tolerating TC trials Strength slowly improving   Continue current plan  Purcell Nailslarence H Kameran Lallier, MD 03/07/2015 1:26 PM

## 2015-03-07 NOTE — NC FL2 (Signed)
[place] MEDICAID FL2 LEVEL OF CARE SCREENING TOOL     IDENTIFICATION  Patient Name: Breanna Alvarez Birthdate: Mar 23, 1951 Sex: female Admission Date (Current Location): 01/26/2015  [place]  [place]  ([id number]) Facility and Address:  [facility], [address], [place], [place] [address]      Provider Number: [id number]  Attending Physician Name and Address:  Kerin PernaPeter Van Trigt, MD  Relative Name and Phone Number:       Current Level of Care: Hospital Recommended Level of Care: Nursing Facility Prior Approval Number:    Date Approved/Denied:   PASRR Number:    Discharge Plan: SNF    Current Diagnoses: Patient Active Problem List   Diagnosis Date Noted  . ARDS (adult respiratory distress syndrome) (HCC)   . Aspiration into airway   . Acute on chronic systolic heart failure (HCC)   . Malnutrition of moderate degree (HCC) 02/17/2015  . Acute and chronic respiratory failure (acute-on-chronic) (HCC) 02/15/2015  . Failure to wean from mechanical ventilation (HCC) 02/15/2015  . Chest tube in place   . Acute MI, subendocardial (HCC)   . Atelectasis   . Congestive heart failure (CHF) (HCC)   . Acute respiratory failure (HCC)   . CAD (coronary artery disease) 02/05/2015  . 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 Coronary artery disease involving native coronary artery of native heart without angina pectoris   . Unstable angina (HCC) 01/04/2015  . Cardiomyopathy, ischemic 01/04/2015  . Essential hypertension 10/01/2014  . CAD, multiple vessel 07/14/2014  . Abnormal PFTs 07/14/2014  . Moderate mitral regurgitation 07/14/2014  . Hyperlipidemia LDL goal <70 07/14/2014  . Leukocytosis 07/14/2014  . Normocytic anemia 07/14/2014  . Chronic systolic heart failure (HCC)   . NSTEMI (non-ST elevated myocardial infarction) (HCC)     Orientation ACTIVITIES/SOCIAL BLADDER RESPIRATION    Situation, Place, Time  Family supportive Indwelling  catheter O2 (As needed)  BEHAVIORAL SYMPTOMS/MOOD NEUROLOGICAL BOWEL NUTRITION STATUS      Continent Feeding tube  PHYSICIAN VISITS COMMUNICATION OF NEEDS Height & Weight Skin  30 days Verbally   86 lbs. Surgical wounds          AMBULATORY STATUS RESPIRATION    Assist extensive O2 (As needed)      Personal Care Assistance Level of Assistance  Bathing, Feeding, Dressing Bathing Assistance: Maximum assistance Feeding assistance: Limited assistance Dressing Assistance: Maximum assistance      Functional Limitations Info                SPECIAL CARE FACTORS FREQUENCY  PT (By licensed PT), OT (By licensed OT)     PT Frequency:  (5x/week) OT Frequency: 5x/week           Additional Factors Info                  Current Medications (03/07/2015): Current Facility-Administered Medications  Medication Dose Route Frequency Provider Last Rate Last Dose  . 0.9 %  sodium chloride infusion   Intra-arterial PRN Kathlee NationsPeter Van Trigt, MD      . 0.9 %  sodium chloride infusion   Intravenous Continuous Kerin PernaPeter Van Trigt, MD 10 mL/hr at 03/07/15 1100    . acetaminophen (TYLENOL) solution 650 mg  650 mg Oral Q6H PRN Roslynn AmbleJennings E Nestor, MD   650 mg at 03/05/15 2119  . albuterol (PROVENTIL) (2.5 MG/3ML) 0.083% nebulizer solution 2.5 mg  2.5 mg Nebulization Q6H Roslynn AmbleJennings E Nestor, MD      .  antiseptic oral rinse solution (CORINZ)  7 mL Mouth Rinse QID Kerin Perna, MD   7 mL at 03/07/15 1201  . aspirin EC tablet 325 mg  325 mg Oral Daily Wilmon Pali, PA-C   325 mg at 03/03/15 4696   Or  . aspirin chewable tablet 324 mg  324 mg Per Tube Daily Gina L Collins, PA-C   324 mg at 03/07/15 1056  . cefTAZidime (FORTAZ) 2 g in dextrose 5 % 50 mL IVPB  2 g Intravenous Q12H Kerin Perna, MD   2 g at 03/07/15 1055  . chlorhexidine gluconate (PERIDEX) 0.12 % solution 15 mL  15 mL Mouth Rinse BID Kerin Perna, MD   15 mL at 03/07/15 0851  . clonazePAM (KLONOPIN) tablet 1 mg  1 mg Oral BID  Coralyn Helling, MD   1 mg at 03/07/15 1056  . digoxin (LANOXIN) tablet 0.0625 mg  0.0625 mg Oral Daily Kerin Perna, MD   0.0625 mg at 03/07/15 1055  . enoxaparin (LOVENOX) injection 30 mg  30 mg Subcutaneous Q24H Nelda Bucks, MD   30 mg at 03/07/15 1055  . feeding supplement (VITAL AF 1.2 CAL) liquid 1,000 mL  1,000 mL Per Tube Q24H Kerin Perna, MD 50 mL/hr at 03/07/15 1100 1,000 mL at 03/07/15 1100  . fentaNYL (SUBLIMAZE) injection 100 mcg  100 mcg Intravenous Q2H PRN Coralyn Helling, MD   100 mcg at 03/04/15 2049  . furosemide (LASIX) tablet 40 mg  40 mg Oral Daily Kerin Perna, MD   40 mg at 03/07/15 1056  . Gerhardt's butt cream   Topical QID PRN Kerin Perna, MD      . hydrALAZINE (APRESOLINE) tablet 10 mg  10 mg Oral 3 times per day Dolores Patty, MD   10 mg at 03/07/15 0539  . insulin aspart (novoLOG) injection 0-24 Units  0-24 Units Subcutaneous 6 times per day Claudie Leach, [facility]   4 Units at 03/07/15 1200  . lactobacillus acidophilus (BACID) tablet 2 tablet  2 tablet Oral BID Kerin Perna, MD   2 tablet at 03/07/15 1055  . magic mouthwash  10 mL Oral TID PRN Kerin Perna, MD   10 mL at 02/23/15 1003  . metoprolol tartrate (LOPRESSOR) tablet 25 mg  25 mg Oral BID Kerin Perna, MD   25 mg at 03/07/15 1055  . midazolam (VERSED) injection 2 mg  2 mg Intravenous Q2H PRN Coralyn Helling, MD   2 mg at 03/01/15 1900  . mupirocin ointment (BACTROBAN) 2 % 1 application  1 application Topical BID Kerin Perna, MD   1 application at 03/07/15 1057  . nitroGLYCERIN (NITRODUR - Dosed in mg/24 hr) patch 0.4 mg  0.4 mg Transdermal Daily Kerin Perna, MD   0.4 mg at 03/07/15 1058  . ondansetron (ZOFRAN) injection 4 mg  4 mg Intravenous Q6H PRN Wilmon Pali, PA-C   4 mg at 02/17/15 1011  . oxyCODONE (ROXICODONE) 5 MG/5ML solution 5 mg  5 mg Per Tube Q6H PRN Leslye Peer, MD   5 mg at 03/07/15 0851  . pantoprazole sodium (PROTONIX) 40 mg/20 mL oral suspension 40 mg  40 mg  Per Tube Daily Kerin Perna, MD   40 mg at 03/07/15 1056  . QUEtiapine (SEROQUEL) tablet 100 mg  100 mg Oral QHS Cyril Mourning V, MD   100 mg at 03/06/15 2159  . scopolamine (TRANSDERM-SCOP) 1 MG/3DAYS  1.5 mg  1 patch Transdermal Q72H Roslynn Amble, MD   1.5 mg at 03/07/15 1201  . sodium chloride 0.9 % injection 10-40 mL  10-40 mL Intracatheter Q12H Kerin Perna, MD   10 mL at 03/07/15 1054  . sodium chloride 0.9 % injection 10-40 mL  10-40 mL Intracatheter PRN Kerin Perna, MD   10 mL at 02/08/15 1735   Do not use this list as official medication orders. Please verify with discharge summary.  Discharge Medications:   Medication List    ASK your doctor about these medications        aspirin 81 MG chewable tablet  Chew 1 tablet (81 mg total) by mouth daily.     isosorbide mononitrate 30 MG 24 hr tablet  Commonly known as:  IMDUR  Take 1 tablet (30 mg total) by mouth 2 (two) times daily.     lisinopril 2.5 MG tablet  Commonly known as:  PRINIVIL,ZESTRIL  Take 1 tablet (2.5 mg total) by mouth daily.     metoprolol 50 MG tablet  Commonly known as:  LOPRESSOR  Take 1 tablet (50 mg total) by mouth 2 (two) times daily.     nitroGLYCERIN 0.4 MG SL tablet  Commonly known as:  NITROSTAT  Place 1 tablet (0.4 mg total) under the tongue every 5 (five) minutes as needed for chest pain.     pravastatin 80 MG tablet  Commonly known as:  PRAVACHOL  Take 1 tablet (80 mg total) by mouth every evening.        Relevant Imaging Results:  Relevant Lab Results:  Recent Labs    Additional Information    Noheli Melder M, LCSW

## 2015-03-07 NOTE — Progress Notes (Signed)
PULMONARY / CRITICAL CARE MEDICINE   Name: Breanna Alvarez MRN: [id number] DOB: Sep 20, 1950    ADMISSION DATE:  01/26/2015 CONSULTATION DATE:  02/09/2105  REFERRING MD :  Donata Clay   CHIEF COMPLAINT:  Vent management   INITIAL PRESENTATION:  64 yo female admitted with chest pain from CAD.  She had CABG 02/01/15 >> post-op complicated by cardiogenic shock s/p centrimag placement and removal, ischemic cardiomyopathy with EF 20%, open sternum.  She remained on vent post-op and PCCM consulted to assist with vent weaning.  STUDIES:  9/21 Echo >> EF 20-25%, mod MR, PA press 51 mmHg 10/15 Echo >> EF 25 to 30%, PAS 36 mmHg  CULTURES: Resp Ctx (10/24):  Negative Blood Ctx (10/18):  Negative Resp Ctx (10/17):  Negative Urine Ctx (10/17):  Negative Blood Ctx (10/13):  Negative Resp Ctx (10/12):  Negative Resp Ctx (10/4):  Negative  ANTIBIOTICS: Elita Quick 10/26>>> Zosyn 10/12 - 10/21.   SIGNIFICANT EVENTS: 09/26 CABG x 4, placement of centrimag L ventricular assistive device  09/30 removal centrimag  10/03 sternal closure 10/09 Hg drop overnight by 1 gm and transfusion ordered 10/10 Trach by DF 10/12 Off precedex 10/13 possible aspiration event; TNA started 10/14 difficulty with agitation 10/16 off pressors 10/17 off TNA 10/18 Fever 101, transfuse 1 unit PRBC 10/20 start trach collar trials; off milrinone; G tube placed 02/28/15 ;Tolerating trach collar at daytime. On vent at night. 03/01/15 :atc x daytime. Sitting in chair. RN who had her before weekend reports no issues. More alert. Nods to simple Qs 03/03/15 Remains on TC 03/05/15 -  Now on PMV. Had to go back on vent briefly yesterday. Getting daily Nocturnal Vent per RN.   SUBJECTIVE: Patient currently sitting in chair on T bar. He just received pain medication and is confused. She does endorse some pain in this difficult to determine whether or not this is from her PEG tube or from her chest. Patient has had increased  secretions seemingly from her oral pharynx that is also having intermittent coughing with secretions as well. Patient has remained on tracheostomy collar/T bar without adverse event.  REVIEW OF SYSTEMS:  Unobtainable as patient is currently confused after pain medication.  VITAL SIGNS: Temp:  [97.7 F (36.5 C)-98.7 F (37.1 C)] 97.8 F (36.6 C) (10/30 0900) Pulse Rate:  [104-128] 121 (10/30 0900) Resp:  [13-46] 22 (10/30 0900) BP: (90-150)/(31-79) 135/58 mmHg (10/30 0900) SpO2:  [93 %-100 %] 100 % (10/30 0900) FiO2 (%):  [28 %] 28 % (10/30 0900) Weight:  [86 lb 3.2 oz (39.1 kg)] 86 lb 3.2 oz (39.1 kg) (10/30 0500)   HEMODYNAMICS:     VENTILATOR SETTINGS: Vent Mode:  [-]  FiO2 (%):  [28 %] 28 %  INTAKE / OUTPUT:  Intake/Output Summary (Last 24 hours) at 03/07/15 1011 Last data filed at 03/07/15 0900  Gross per 24 hour  Intake   1660 ml  Output   1420 ml  Net    240 ml   PHYSICAL EXAMINATION: General:  Awake. No acute distress. Sitting in chair on T bar.  Integument:  Warm & dry. No rash on exposed skin. Dressing in place over sternum. HEENT:  Moist mucus membranes with some oral secretions. No scleral injection or icterus. Tracheostomy in place. Cardiovascular:  Regular rhythm and slightly tachycardic. Sinus tachycardia on telemetry. No edema.  Pulmonary:  Coarse referred breath sounds bilaterally. Normal work of breathing on T bar. Abdomen: Soft. Normal bowel sounds. Nondistended. PEG tube in place. Musculoskeletal:  Normal bulk and tone. Hand grip strength 5/5 bilaterally. No joint deformity or effusion appreciated. Neurological: Moving all 4 extremities equally. Patient is following commands. However, she is not answering questions appropriately after pain medication.  LABS:`PULMONARY No results for input(s): PHART, PCO2ART, PO2ART, HCO3, TCO2, O2SAT in the last 168 hours.  Invalid input(s): PCO2, PO2  CBC  Recent Labs Lab 03/05/15 0410 03/06/15 0415  03/07/15 0406  HGB 9.4* 9.8* 9.9*  HCT 30.1* 30.8* 32.6*  WBC 7.5 7.6 7.7  PLT 335 347 369     CHEMISTRY  Recent Labs Lab 03/03/15 0418 03/04/15 0445 03/05/15 0410 03/06/15 0415 03/07/15 0406  NA 137 139 143 145 146*  K 3.6 3.6 3.7 3.3* 3.2*  CL 101 103 103 104 105  CO2 GLUCOSE 184* 177* 192* 111* 161*  BUN 38* 42* 52* 55* 56*  CREATININE 1.00 1.01* 1.22* 1.09* 1.10*  CALCIUM 8.4* 8.8* 9.2 9.4 9.6   Estimated Creatinine Clearance: 32.7 mL/min (by C-G formula based on Cr of 1.1).  LIVER  Recent Labs Lab 03/01/15 0815 03/02/15 0401 03/03/15 0418  AST  --  37 35  ALT  --  55* 58*  ALKPHOS  --  113 135*  BILITOT  --  1.5* 1.7*  PROT  --  6.5 6.8  ALBUMIN  --  2.3* 2.4*  INR 1.31  --   --    ENDOCRINE CBG (last 3)   Recent Labs  03/06/15 2015 03/07/15 0014 03/07/15 0402  GLUCAP 172* 97 153*    Iron/TIBC/Ferritin/ %Sat    Component Value Date/Time   IRON 19* 02/22/2015 0900   TIBC 143* 02/22/2015 0900   FERRITIN 1025* 02/22/2015 0900   IRONPCTSAT 13 02/22/2015 0900    IMAGING x48h No results found.  ASSESSMENT / PLAN: 64 year old female status post CABG with acute hypoxic respiratory failure. Patient's mental status is somewhat altered at this time post pain medication. She seems to be tolerating T bar/trach collar quite well. Patient is also tolerating Shelbie Hutching valve. She has had increased oropharyngeal secretions as well as some respiratory secretions. She is tolerating her tube feedings via PEG. Patient is able to stand and pivot per nursing staff. Social worker is currently planning on ultimate disposition to a local facility within go for [place].  1. Acute on chronic hypoxic respiratory failure: Secondary to aspiration pneumonia & pulmonary edema from cardiogenic shock after CABG. Status post tracheostomy placement on 10/10 by Dr. Tyson Alias. Recommended continuing T bar/tracheostomy collar as tolerated & has been on since 9am  10/28. Switching albuterol nebs to every 6 hours for continued airway clearance. Discontinuing Mucomyst. Starting scopolamine patch for oral secretions. 2. Aspiration pneumonia/HCAP: Repeat culture from 10/24 negative. Continuing Fortaz empirically for coverage day #5/10. 3. Cardiogenic shock: Resolved. 4. Acute renal failure: Resolved. 5. Hypokalemia: Replacing with potassium chloride 40 mEq via tube. Electrolyte panel in a.m. with magnesium. 6. Coronary artery disease w/ ICM: Status post CABG. Currently on aspirin, Lopressor, nitroglycerin patch, & hydralazine. 7. Protein calorie malnutrition: Continuing tube feeds per dietary recommendations. 8. Anemia: Hemoglobin stable. No signs of active bleeding. Continuing to monitor global daily with CBC. 9. Hypernatremia: Mild. Continue to monitor electrolytes daily. 10. Delirium: Multifactorial. Continuing Klonopin twice a day & Seroquel. 11. Prophylaxis: Lovenox subcutaneous daily, SCDs, & Protonix via tube. 12. Disposition: Medical social worker arranging for transition to a local facility at husband's request. PT/OT consulted.  Donna Christen Jamison Neighbor, M.D. [facility] Pulmonary & Critical Care Pager:  [phone] After 3pm or if no response, call [phone]  03/07/2015 10:11 AM

## 2015-03-07 NOTE — Plan of Care (Signed)
Problem: Phase II Progression Outcomes Goal: Progress activities as ordered Outcome: Progressing Pt requires 2 person heavy assist from bed to chair to bed, pt is taking some steps but unable to bear much of her own weight

## 2015-03-07 NOTE — Clinical Social Work Placement (Signed)
   CLINICAL SOCIAL WORK PLACEMENT  NOTE  Date:  03/07/2015  Patient Details  Name: Salli QuarryMaria T Roy MRN: [id number] Date of Birth: [birth year]  Clinical Social Work is seeking post-discharge placement for this patient at the   level of care (*CSW will initial, date and re-position this form in  chart as items are completed):      Patient/family provided with [facility] Clinical [facility]'s list of facilities offering this level of care within the geographic area requested by the patient (or if unable, by the patient's family).      Patient/family informed of their freedom to choose among providers that offer the needed level of care, that participate in Medicare, Medicaid or managed care program needed by the patient, have an available bed and are willing to accept the patient.      Patient/family informed of [facility]'s ownership interest in [facility] and [facility], as well as of the fact that they are under no obligation to receive care at these facilities.  PASRR submitted to EDS on       PASRR number received on       Existing PASRR number confirmed on       FL2 transmitted to all facilities in geographic area requested by pt/family on       FL2 transmitted to all facilities within larger geographic area on       Patient informed that his/her managed care company has contracts with or will negotiate with certain facilities, including the following:            Patient/family informed of bed offers received.  Patient chooses bed at       Physician recommends and patient chooses bed at      Patient to be transferred to   on  .  Patient to be transferred to facility by       Patient family notified on   of transfer.  Name of family member notified:        PHYSICIAN       Additional Comment:    _______________________________________________ Linna CapriceRAKE, Riddik Senna M, LCSW 03/07/2015, 12:33 PM

## 2015-03-08 ENCOUNTER — Inpatient Hospital Stay (HOSPITAL_COMMUNITY): Payer: Medicaid Other

## 2015-03-08 DIAGNOSIS — E870 Hyperosmolality and hypernatremia: Secondary | ICD-10-CM

## 2015-03-08 LAB — COMPREHENSIVE METABOLIC PANEL
ALBUMIN: 2.5 g/dL — AB (ref 3.5–5.0)
ALT: 34 U/L (ref 14–54)
AST: 22 U/L (ref 15–41)
Alkaline Phosphatase: 103 U/L (ref 38–126)
Anion gap: 11 (ref 5–15)
BILIRUBIN TOTAL: 1.3 mg/dL — AB (ref 0.3–1.2)
BUN: 63 mg/dL — AB (ref 6–20)
CHLORIDE: 109 mmol/L (ref 101–111)
CO2: 28 mmol/L (ref 22–32)
CREATININE: 1.06 mg/dL — AB (ref 0.44–1.00)
Calcium: 9.3 mg/dL (ref 8.9–10.3)
GFR calc Af Amer: >60 mL/min (ref >60)
GFR calc non Af Amer: 55 mL/min — ABNORMAL LOW (ref >60)
GLUCOSE: 201 mg/dL — AB (ref 65–99)
POTASSIUM: 3.7 mmol/L (ref 3.5–5.1)
SODIUM: 148 mmol/L — AB (ref 135–145)
TOTAL PROTEIN: 7.6 g/dL (ref 6.5–8.1)

## 2015-03-08 LAB — GLUCOSE, CAPILLARY
GLUCOSE-CAPILLARY: 172 mg/dL — AB (ref 65–99)
GLUCOSE-CAPILLARY: 106 mg/dL — AB (ref 65–99)
GLUCOSE-CAPILLARY: 154 mg/dL — AB (ref 65–99)
GLUCOSE-CAPILLARY: 138 mg/dL — AB (ref 65–99)
Glucose-Capillary: 137 mg/dL — ABNORMAL HIGH (ref 65–99)
Glucose-Capillary: 98 mg/dL (ref 65–99)

## 2015-03-08 LAB — CBC
HCT: 31.6 % — ABNORMAL LOW (ref 36.0–46.0)
Hemoglobin: 9.6 g/dL — ABNORMAL LOW (ref 12.0–15.0)
MCH: 27.6 pg (ref 26.0–34.0)
MCHC: 30.4 g/dL (ref 30.0–36.0)
MCV: 90.8 fL (ref 78.0–100.0)
Platelets: 347 10*3/uL (ref 150–400)
RBC: 3.48 MIL/uL — ABNORMAL LOW (ref 3.87–5.11)
RDW: 17.9 % — AB (ref 11.5–15.5)
WBC: 8.1 10*3/uL (ref 4.0–10.5)

## 2015-03-08 LAB — MAGNESIUM: MAGNESIUM: 2.2 mg/dL (ref 1.7–2.4)

## 2015-03-08 MED ORDER — FUROSEMIDE 20 MG PO TABS
20.0000 mg | ORAL_TABLET | Freq: Every day | ORAL | Status: DC
  Filled 2015-03-08: qty 1

## 2015-03-08 MED ORDER — JEVITY 1.2 CAL PO LIQD
1000.0000 mL | ORAL | Status: DC
  Administered 2015-03-08 – 2015-03-09 (×2): 1000 mL
  Administered 2015-03-10 – 2015-03-12 (×3): 50 mL/h
  Administered 2015-03-13: 1000 mL
  Administered 2015-03-13: 50 mL/h
  Filled 2015-03-08 (×13): qty 1000

## 2015-03-08 NOTE — Progress Notes (Signed)
Physical Therapy Treatment Patient Details Name: Breanna Alvarez MRN: [id number] DOB: [birth year] Today's Date: 03/08/2015    History of Present Illness Breanna Alvarez is a [age] y.o. female who has PMHx of CAD, CHF and had an NSTEMI in ealy 2016, present to ED with ongoing CP.  She is now s/p CABG as of 9/27 with Centrimag placement, 9/30 Centrimag removal, 10/3 sternal closure, Wound I and D 10/4, failed extubation 10/7, 10/10 trached, ?aspiration on 10/13 and worsening status.     [facility]..  Using PMV as of 10/28.    PT Comments    Progressing well, but slowly. Pt's trunk control noticeably improved with better scoot and better posture during gait.  Follow Up Recommendations  SNF     Equipment Recommendations  None recommended by PT (TBA)    Recommendations for Other Services       Precautions / Restrictions Precautions Precautions: Fall;Sternal    Mobility  Bed Mobility Overal bed mobility: Needs Assistance Bed Mobility: Supine to Sit     Supine to sit: Mod assist Sit to supine: Max assist;+2 for safety/equipment      Transfers Overall transfer level: Needs assistance Equipment used: Rolling walker (2 wheeled) Transfers: Sit to/from Stand Sit to Stand: Mod assist;+2 safety/equipment (x4)         General transfer comment: assist to come forward and lift.  work to get pt to use UE's to help stand  Ambulation/Gait Ambulation/Gait assistance: Mod assist;+2 safety/equipment Ambulation Distance (Feet): 5 Feet (forw/back x2, 2 feet forw/back and marching in place) Assistive device: Rolling walker (2 wheeled) Gait Pattern/deviations: Step-to pattern;Step-through pattern;Decreased step length - right;Decreased step length - left;Shuffle;Ataxic;Narrow base of support Gait velocity: slow   General Gait Details: unsteady through the several gait trials with scissoring  and mild ataxia, worse backing up.   Stairs            Wheelchair  Mobility    Modified Rankin (Stroke Patients Only)       Balance Overall balance assessment: Needs assistance Sitting-balance support: No upper extremity supported;Single extremity supported Sitting balance-Leahy Scale: Fair Sitting balance - Comments: work on scooting   Standing balance support: Single extremity supported;Bilateral upper extremity supported Standing balance-Leahy Scale: Poor                      Cognition Arousal/Alertness: Awake/alert Behavior During Therapy: WFL for tasks assessed/performed Overall Cognitive Status: Difficult to assess (but functional for tasks assessed)         Following Commands: Follows one step commands with increased time;Follows one step commands consistently            Exercises      General Comments General comments (skin integrity, edema, etc.): VSS, upper 90's on 28% TC, HR110's      Pertinent Vitals/Pain Pain Assessment: Faces Faces Pain Scale: No hurt    Home Living                      Prior Function            PT Goals (current goals can now be found in the care plan section) Acute Rehab PT Goals Patient Stated Goal: not stated PT Goal Formulation: Patient unable to participate in goal setting Time For Goal Achievement: 03/12/15 Potential to Achieve Goals: Fair Progress towards PT goals: Progressing toward goals    Frequency  Min 3X/week    PT Plan Current plan remains  appropriate    Co-evaluation             End of Session Equipment Utilized During Treatment: Oxygen Activity Tolerance: Patient tolerated treatment well Patient left: in bed;with call bell/phone within reach;with SCD's reapplied     Time: 1440-1506 PT Time Calculation (min) (ACUTE ONLY): 26 min  Charges:  $Gait Training: 8-22 mins $Therapeutic Activity: 8-22 mins                    G Codes:      Garnet Overfield, Eliseo Gum 03/08/2015, 5:17 PM 03/08/2015  [place] Bing, PT [phone]   (pager)

## 2015-03-08 NOTE — Progress Notes (Signed)
      [address]       [place] [id number]             [phone]       PM ROUNDS  BP 119/70 mmHg  Pulse 110  Temp(Src) 98.5 F (36.9 C) (Axillary)  Resp 22  Ht 5' (1.524 m)  Wt 85 lb 5.1 oz (38.7 kg)  BMI 16.66 kg/m2  SpO2 100%   Intake/Output Summary (Last 24 hours) at 03/08/15 1753 Last data filed at 03/08/15 1500  Gross per 24 hour  Intake 1357.5 ml  Output   1210 ml  Net  147.5 ml    Working with PT/OT/ speech  Viviann SpareSteven C. Dorris FetchHendrickson, MD [facility] [phone]

## 2015-03-08 NOTE — Progress Notes (Signed)
Speech Language Pathology Treatment: Hillary BowPassy Muir Speaking valve  Patient Details Name: Breanna QuarryMaria T Basista MRN: [id number] DOB: 08-14-1950 Today's Date: 03/08/2015 Time: 0200-0220 SLP Time Calculation (min) (ACUTE ONLY): 20 min  Assessment / Plan / Recommendation Clinical Impression  Pt's cuff was deflated prior to SLP arrival. SLP put pt on trach collar and placed PMSV for 20 minutes. Pt tolerated PMSV with good redirection of air to the upper airway and was able to orally exporate secretions. Pt's respiratory rate reading was inaccurate, but O2 stated around 100% and heart rate was around 110 throughout trial. Pt was more lethargic today, with family present in the room she had limited verbal output but did tell them that she loved them. SLP provided education to the pt and family. Due to lethargy, SLP did not attempt po trials. Will f/u with PMSV treatment and po trials to determine readiness for objective swallow evaluation.    HPI Other Pertinent Information: 64 yo female never smoker with hx HTN, CAD previously refused CABG ultimately admitted 9/20 for CVTS eval and CABG after ongoing chest pain. On 02/01/15 she had a CORONARY ARTERY BYPASS GRAFTING (CABG), MITRAL VALVE REPAIR (MVR), and TRANSESOPHAGEAL ECHOCARDIOGRAM (TEE) and has had complicated course with cardiogenic shock s/p centrimag placement (temporary LVAD) and removal (on 9/30), ischemic cardiomyopathy with EF 20%, open sternum now s/p sternal closure 10/3. She remained intubated since initial CABG 9/26 with failure to wean, she was trached (6mm Shiley) on 02/15/15, trach collar trials started. She had a possible aspiration event from tube feeds on 10/13, PEG placed 10/19.    Pertinent Vitals Pain Assessment: Faces Faces Pain Scale: No hurt  SLP Plan  Continue with current plan of care    Recommendations Diet recommendations: NPO      Patient may use Passy-Muir Speech Valve: During all waking hours (remove during sleep);During all  therapies with supervision PMSV Supervision: Intermittent       Oral Care Recommendations: Oral care QID Follow up Recommendations: Skilled Nursing facility Plan: Continue with current plan of care    GO    Riccardo DubinKristen Loeta Herst, Student-SLP  Riccardo DubinKristen Deovion Batrez 03/08/2015, 2:43 PM

## 2015-03-08 NOTE — Progress Notes (Addendum)
Nutrition Follow Up  DOCUMENTATION CODES:   Non-severe (moderate) malnutrition in context of chronic illness, Underweight  INTERVENTION:    D/C Vital AF 1.2 formula  Initiate Jevity 1.2 formula at 20 ml/hr and increase by 10 ml every 8 hours to goal rate of 50 ml/hr  TF regimen to provide 1440 kcals, 67 gm protein, 968 ml of free water  NUTRITION DIAGNOSIS:   Inadequate oral intake related to inability to eat as evidenced by NPO status, ongoing  GOAL:   Patient will meet greater than or equal to 90% of their needs, met  MONITOR:   TF tolerance, Diet advancement, Labs, Weight trends, I & O's  ASSESSMENT:   64 yo female never smoker with hx HTN, CAD previously refused CABG ultimately admitted 9/20 for CVTS eval and CABG after ongoing chest pain. She underwent 4V CABG 9/26 and has had complicated course with cardiogenic shock s/p centrimag placement and removal, ischemic cardiomyopathy with EF 20%, open sternum now s/p sternal closure 10/3. She remains intubated since initial CABG 9/26 with poor weaning and PCCM now consulted to assist.   Patient s/p procedure 9/26: CORONARY ARTERY BYPASS GRAFTING x 4 PLACEMENT OF CENTRIMAG VENTRICULAR ASSIST DEVICE  Patient s/p procedure 10/20: IR GASTROSTOMY TUBE MOD SED  Pt currently on trach collar.  Vital AF 1.2 formula infusing at 50 ml/hr via G-tube providing 1440 kcals, 90 gm protein, 973 ml of free water.    Patient discussed in ICU rounds.  Having loose, "seedy" stools.  Speech Path following for dysphagia, PMSV.  Nutrition-Focused physical exam completed. Findings are mild/moderate fat depletion, mild/moderate muscle depletion, and no edema.   Diet Order:  Diet NPO time specified Except for: Sips with Meds  Skin:  sternal wound closed  Last BM:  10/31  Height:   Ht Readings from Last 1 Encounters:  03/04/15 5' (1.524 m)    Weight:   Wt Readings from Last 1 Encounters:  03/08/15 85 lb 5.1 oz (38.7 kg)    Ideal  Body Weight:  45.4 kg  BMI:  Body mass index is 16.66 kg/(m^2).  Estimated Nutritional Needs:   Kcal:  1400-1600  Protein:  60-70 gm  Fluid:  per MD  EDUCATION NEEDS:   No education needs identified at this time  Arthur Holms, RD, LDN Pager #: [phone] After-Hours Pager #: [phone]

## 2015-03-08 NOTE — Progress Notes (Signed)
PULMONARY / CRITICAL CARE MEDICINE   Name: Breanna Alvarez MRN: [id number] DOB: [birth year]    ADMISSION DATE:  01/26/2015 CONSULTATION DATE:  02/09/2105  REFERRING MD :  Donata ClayVan Trigt   CHIEF COMPLAINT:  Vent management   INITIAL PRESENTATION:  64 yo female admitted with chest pain from CAD.  She had CABG 02/01/15 >> post-op complicated by cardiogenic shock s/p centrimag placement and removal, ischemic cardiomyopathy with EF 20%, open sternum.  She remained on vent post-op and PCCM consulted to assist with vent weaning.  STUDIES:  9/21 Echo - EF 20-25%, mod MR, PA press 51 mmHg 10/15 Echo - EF 25 to 30%, PAS 36 mmHg 10/31 Portable CXR - no new focal opacity or effusion.  CULTURES: Resp Ctx (10/24):  Negative Blood Ctx (10/18):  Negative Resp Ctx (10/17):  Negative Urine Ctx (10/17):  Negative Blood Ctx (10/13):  Negative Resp Ctx (10/12):  Negative Resp Ctx (10/4):  Negative  ANTIBIOTICS: Elita QuickFortaz 10/26>>> Zosyn 10/12 - 10/21.   SIGNIFICANT EVENTS: 09/26 CABG x 4, placement of centrimag L ventricular assistive device  09/30 removal centrimag  10/03 sternal closure 10/09 Hg drop overnight by 1 gm and transfusion ordered 10/10 Trach by DF 10/12 Off precedex 10/13 possible aspiration event; TNA started 10/14 difficulty with agitation 10/16 off pressors 10/17 off TNA 10/18 Fever 101, transfuse 1 unit PRBC 10/20 start trach collar trials; off milrinone; G tube placed 02/28/15 ;Tolerating trach collar at daytime. On vent at night. 03/01/15 :atc x daytime. Sitting in chair. RN who had her before weekend reports no issues. More alert. Nods to simple Qs 03/03/15 Remains on TC 03/05/15 -  Now on PMV. Had to go back on vent briefly yesterday. Getting daily Nocturnal Vent per RN.   SUBJECTIVE: Tolerating T bar. Not requiring ventilator since AM 10/28. No acute events overnight. Patient denies any chest or abdominal pain with a nod. She denies any nausea or vomiting. Patient was able  to take a couple of steps and pivot from the bed this morning to get into chair.  REVIEW OF SYSTEMS:  Unobtainable as patient has PMV out with secretions.   VITAL SIGNS: Temp:  [98.5 F (36.9 C)-99.4 F (37.4 C)] 99.4 F (37.4 C) (10/31 0800) Pulse Rate:  [37-125] 111 (10/31 0900) Resp:  [9-69] 41 (10/31 0900) BP: (84-150)/(36-87) 122/57 mmHg (10/31 0900) SpO2:  [97 %-100 %] 100 % (10/31 0900) FiO2 (%):  [28 %] 28 % (10/31 0839) Weight:  [85 lb 5.1 oz (38.7 kg)] 85 lb 5.1 oz (38.7 kg) (10/31 0437)   HEMODYNAMICS:     VENTILATOR SETTINGS: Vent Mode:  [-]  FiO2 (%):  [28 %] 28 %  INTAKE / OUTPUT:  Intake/Output Summary (Last 24 hours) at 03/08/15 0949 Last data filed at 03/08/15 0900  Gross per 24 hour  Intake   1635 ml  Output   1171 ml  Net    464 ml   PHYSICAL EXAMINATION: General:  Awake. Sitting up in chair. Dosing off easily.  Integument:  Warm & dry. No rash on exposed skin.  HEENT:  Moist mucus membranes with some oral secretions. No scleral injection. Tracheostomy in place. Cardiovascular:  Regular rhythm and rate. Sinus rhythm on telemetry. No edema.  Pulmonary:  Coarse referred breath sounds bilaterally. Normal work of breathing on T bar. Abdomen: Soft. Normal bowel sounds. PEG tube in place. Musculoskeletal:  Normal bulk and tone. No joint deformity or effusion appreciated. Neurological: Moving all 4 extremities equally. Follows commands by giving  a thumbs up on appropriate hand bilaterally.  LABS:`PULMONARY No results for input(s): PHART, PCO2ART, PO2ART, HCO3, TCO2, O2SAT in the last 168 hours.  Invalid input(s): PCO2, PO2  CBC  Recent Labs Lab 03/06/15 0415 03/07/15 0406 03/08/15 0430  HGB 9.8* 9.9* 9.6*  HCT 30.8* 32.6* 31.6*  WBC 7.6 7.7 8.1  PLT 347 369 347     CHEMISTRY  Recent Labs Lab 03/04/15 0445 03/05/15 0410 03/06/15 0415 03/07/15 0406 03/08/15 0430  NA 139 143 145 146* 148*  K 3.6 3.7 3.3* 3.2* 3.7  CL 103 103 104 105 109   CO2 GLUCOSE 177* 192* 111* 161* 201*  BUN 42* 52* 55* 56* 63*  CREATININE 1.01* 1.22* 1.09* 1.10* 1.06*  CALCIUM 8.8* 9.2 9.4 9.6 9.3  MG  --   --   --   --  2.2   Estimated Creatinine Clearance: 33.6 mL/min (by C-G formula based on Cr of 1.06).  LIVER  Recent Labs Lab 03/02/15 0401 03/03/15 0418 03/08/15 0430  AST 37 35 22  ALT 55* 58* 34  ALKPHOS 113 135* 103  BILITOT 1.5* 1.7* 1.3*  PROT 6.5 6.8 7.6  ALBUMIN 2.3* 2.4* 2.5*   ENDOCRINE CBG (last 3)   Recent Labs  03/07/15 2348 03/08/15 0404 03/08/15 0826  GLUCAP 98 172* 106*    Iron/TIBC/Ferritin/ %Sat    Component Value Date/Time   IRON 19* 02/22/2015 0900   TIBC 143* 02/22/2015 0900   FERRITIN 1025* 02/22/2015 0900   IRONPCTSAT 13 02/22/2015 0900    IMAGING x48h Dg Chest Port 1 View  03/08/2015  CLINICAL DATA:  64 year old female status post CABG complicated by cardiogenic shock. ICU recovery. Ventilation weaning. Initial encounter. EXAM: PORTABLE CHEST 1 VIEW COMPARISON:  03/05/2015 and earlier. FINDINGS: Portable AP semi upright view at 0601 hours. Stable tracheostomy tube. Stable cardiac size and mediastinal contours. Sequelae of CABG. Left subclavian approach PICC line is stable. Stable lung volumes. No pneumothorax. No consolidation. Decreased pulmonary vascularity without edema. No definite effusion. IMPRESSION: 1.  Stable lines and tubes. 2. Stable to mildly improved ventilation, decreased pulmonary vascular congestion. Electronically Signed   By: Odessa Fleming M.D.   On: 03/08/2015 07:40    ASSESSMENT / PLAN: 64 year old female status post CABG with acute hypoxic respiratory failure. Patient is tolerating T bar but continues to have some secretions after placement of scopolamine patch. She is having infrequent diarrhea without other signs of potential C. difficile infection. Patient does have some mild hypernatremia and as such I am decreasing the dose of Lasix without increasing free water at  this time. Patient is tolerating tube feeds well. Delirium seems to be improving. Ultimately the patient will need disposition to a rehabilitation facility.  1. Acute on chronic hypoxic respiratory failure: Secondary to aspiration pneumonia & pulmonary edema from cardiogenic shock after CABG. Status post tracheostomy placement on 10/10 by Dr. Tyson Alias. Continuing on T bar given secretions. 2. Aspiration pneumonia/HCAP: Repeat culture from 10/24 negative. Continuing Fortaz empirically for coverage day #6/10. 3. Cardiogenic shock: Resolved. 4. Acute renal failure: Resolved. Continuing to trend urine output. Monitoring renal function with daily BUN/creatinine. 5. Hypokalemia: Resolved. Continuing to monitor electrolytes daily. 6. Hypernatremia: Mild. Decreasing dose of Lasix to . Continuing fluid flushes with tube feeds. 7. Coronary artery disease w/ ICM: Status post CABG. Currently on aspirin, Lopressor, nitroglycerin patch, & hydralazine. 8. Protein calorie malnutrition: Continuing tube feeds per dietary recommendations. 9. Anemia: Hemoglobin stable. No signs of active bleeding.  Continuing to monitor global daily with CBC. 10. Delirium: Multifactorial but improving. Continuing Klonopin twice a day & Seroquel. 11. Prophylaxis: Lovenox subcutaneous daily, SCDs, & Protonix via tube. 12. Disposition: Medical social worker arranging for transition to a local facility at husband's request. PT/OT consulted.  Tyra Gural E. Jamison Neighbor, M.D. [facility] Pager:  [phone] After 3pm or if no response, call [phone]  03/08/2015 9:49 AM

## 2015-03-09 ENCOUNTER — Inpatient Hospital Stay (HOSPITAL_COMMUNITY): Payer: Medicaid Other

## 2015-03-09 LAB — CBC
HCT: 32 % — ABNORMAL LOW (ref 36.0–46.0)
Hemoglobin: 9.7 g/dL — ABNORMAL LOW (ref 12.0–15.0)
MCH: 27.8 pg (ref 26.0–34.0)
MCHC: 30.3 g/dL (ref 30.0–36.0)
MCV: 91.7 fL (ref 78.0–100.0)
Platelets: 338 10*3/uL (ref 150–400)
RBC: 3.49 MIL/uL — ABNORMAL LOW (ref 3.87–5.11)
RDW: 18.1 % — ABNORMAL HIGH (ref 11.5–15.5)
WBC: 8.3 10*3/uL (ref 4.0–10.5)

## 2015-03-09 LAB — GLUCOSE, CAPILLARY
GLUCOSE-CAPILLARY: 162 mg/dL — AB (ref 65–99)
GLUCOSE-CAPILLARY: 144 mg/dL — AB (ref 65–99)
Glucose-Capillary: 131 mg/dL — ABNORMAL HIGH (ref 65–99)
Glucose-Capillary: 142 mg/dL — ABNORMAL HIGH (ref 65–99)
Glucose-Capillary: 109 mg/dL — ABNORMAL HIGH (ref 65–99)
Glucose-Capillary: 146 mg/dL — ABNORMAL HIGH (ref 65–99)

## 2015-03-09 LAB — BASIC METABOLIC PANEL
Anion gap: 9 (ref 5–15)
BUN: 62 mg/dL — ABNORMAL HIGH (ref 6–20)
CO2: 30 mmol/L (ref 22–32)
Calcium: 9.4 mg/dL (ref 8.9–10.3)
Chloride: 110 mmol/L (ref 101–111)
Creatinine, Ser: 1.16 mg/dL — ABNORMAL HIGH (ref 0.44–1.00)
GFR calc Af Amer: 57 mL/min — ABNORMAL LOW (ref >60)
GFR calc non Af Amer: 49 mL/min — ABNORMAL LOW (ref >60)
Glucose, Bld: 193 mg/dL — ABNORMAL HIGH (ref 65–99)
Potassium: 3.7 mmol/L (ref 3.5–5.1)
Sodium: 149 mmol/L — ABNORMAL HIGH (ref 135–145)

## 2015-03-09 NOTE — Progress Notes (Signed)
Speech Language Pathology Treatment: Breanna Alvarez Speaking valve;Dysphagia  Patient Details Name: Breanna Alvarez MRN: [id number] DOB: Dec 07, 1950 Today's Date: 03/09/2015 Time: [id number] SLP Time Calculation (min) (ACUTE ONLY): 20 min  Assessment / Plan / Recommendation Clinical Impression  Copious secretions prior to initiation of treatment for PMSV and po trials. PMSV donned total 15 minutes which facilitated pressure required to mobilize pharyngeal secretions to oral cavity. Pt required mod-max verbal and visual cueing for over articulation to increase speech intelligibility. RR, HR and SpO2 stable throughout session. No evidence of back pressure during PMSV use or at removal. Majority of thought processes/verbal communication were confused with decreased semantics. Pt alert and self fed (set up provided) trials of puree and ice chips with minimal throat clear x 1 and suspected delayed swallow initiation. If alertness continues to be consistent, recommend objective swallow evaluation.   HPI Other Pertinent Information: 64 yo female never smoker with hx HTN, CAD previously refused CABG ultimately admitted 9/20 for CVTS eval and CABG after ongoing chest pain. On 02/01/15 she had a CORONARY ARTERY BYPASS GRAFTING (CABG), MITRAL VALVE REPAIR (MVR), and TRANSESOPHAGEAL ECHOCARDIOGRAM (TEE) and has had complicated course with cardiogenic shock s/p centrimag placement (temporary LVAD) and removal (on 9/30), ischemic cardiomyopathy with EF 20%, open sternum now s/p sternal closure 10/3. She remained intubated since initial CABG 9/26 with failure to wean, she was trached (6mm Shiley) on 02/15/15, trach collar trials started. She had a possible aspiration event from tube feeds on 10/13, PEG placed 10/19.    Pertinent Vitals Pain Assessment: No/denies pain  SLP Plan  Continue with current plan of care (objective assessment MBS or FEES)    Recommendations        Patient may use Passy-Alvarez Speech Valve:  During all waking hours (remove during sleep);During all therapies with supervision PMSV Supervision: Intermittent MD: Please consider changing trach tube to : Cuffless       Oral Care Recommendations: Oral care BID Follow up Recommendations: Skilled Nursing facility Plan: Continue with current plan of care (objective assessment MBS or FEES)        Royce MacadamiaLitaker, [place] Wiseman Willis 03/09/2015, 2:47 PM  Breck CoonsLisa Willis Lonell FaceLitaker M.Ed [employer]-SLP Pager [phone]

## 2015-03-09 NOTE — Progress Notes (Signed)
Occupational Therapy Treatment Patient Details Name: Breanna Alvarez MRN: [id number] DOB: 1950/12/30 Today's Date: 03/09/2015    History of present illness Breanna Alvarez is a 64 y.o. female who has PMHx of CAD, CHF and had an NSTEMI in ealy 2016, present to ED with ongoing CP.  She is now s/p CABG as of 9/27 with Centrimag placement, 9/30 Centrimag removal, 10/3 sternal closure, Wound I and D 10/4, failed extubation 10/7, 10/10 trached, ?aspiration on 10/13 and worsening status.     First attempt wean to [facility]. 10/19..  Using PMV as of 10/28.   OT comments  Will continue to follow acutely. Continue to recommend SNF. Worked on ADLs in session-pt seemed limited by apparent cognitive deficits.   Follow Up Recommendations  SNF    Equipment Recommendations  None recommended by OT    Recommendations for Other Services      Precautions / Restrictions Precautions Precautions: Fall;Sternal Restrictions Weight Bearing Restrictions: Yes (sternal precautions)       Mobility Bed Mobility Overal bed mobility: Needs Assistance Bed Mobility: Rolling;Sidelying to Sit;Sit to Sidelying   Sidelying to sit: Mod assist  Rolling: Nurse appeared to do Max-Total assist with rolling when adjusting pad, however pt able to roll over over on side without physical assist with OT earlier in session.   Sit to sidelying: Mod assist General bed mobility comments: assist with trunk to come to sitting position. +2 assist given to scoot HOB.          Balance    Pt leaning to right when sitting EOB-OT helped to better align.                                ADL Overall ADL's : Needs assistance/impaired     Grooming: Bed level;Wash/dry face;Brushing hair;Maximal assistance Grooming Details (indicate cue type and reason): pt dabbed washcloth on face, but didn't wash thoroughly; hand over hand given for brushing hair and then she was able to comb it some without physical assist Upper Body Bathing:  Bed level;Maximal assistance Upper Body Bathing Details (indicate cue type and reason)   Lower Body Bathing Details (indicate cue type and reason): OT washed off top of pt's leg trying to get pt to do so, but she would not do so.                              Vision                     Perception     Praxis      Cognition  Awake/Alert Behavior During Therapy: Restless Overall Cognitive Status: No family/caregiver present to determine baseline cognitive functioning (difficult to assess due to tracheostomy) Area of Impairment: Attention;Following commands;Safety/judgement;Problem solving   Current Attention Level:  (focused-sustained)    Following Commands: Follows one step commands inconsistently Safety/Judgement: Decreased awareness of safety   Problem Solving: Slow processing;Requires verbal cues General Comments: Pt using comb to brush her leg.     Extremity/Trunk Assessment               Exercises     Shoulder Instructions       General Comments      Pertinent Vitals/ Pain       Pain Assessment: No/denies pain Pain Score: 0-No pain  Home Living  Prior Functioning/Environment              Frequency Min 3X/week     Progress Toward Goals  OT Goals(current goals can now be found in the care plan section)  Progress towards OT goals: Progressing toward goals  Acute Rehab OT Goals Patient Stated Goal: not stated OT Goal Formulation: Patient unable to participate in goal setting Time For Goal Achievement: 03/15/15 Potential to Achieve Goals: Good ADL Goals Pt Will Perform Grooming: with set-up;with supervision;sitting Pt Will Perform Upper Body Bathing: sitting;with set-up;with supervision Pt Will Perform Upper Body Dressing: with min assist;sitting Pt Will Transfer to Toilet: stand pivot transfer;with mod assist;with +2 assist;bedside commode Additional ADL Goal #1: Pt  will actively participate in 20 therapeutic activity with no more than 2 rest breaks to increase activity tolerance/endurance as needed for ADLs  Additional ADL Goal #2: Pt will sit unuspported x 5 mins with min guard assist.   Plan Discharge plan remains appropriate;Frequency needs to be updated    Co-evaluation                 End of Session Equipment Utilized During Treatment: Oxygen   Activity Tolerance Other (comment) (decreased cognition)   Patient Left in bed;with call bell/phone within reach;with nursing/sitter in room   Nurse Communication Other (comment) (assisted in adjusting pt in bed)        Time: [id number] OT Time Calculation (min): 18 min  Charges: OT General Charges $OT Visit: 1 Procedure OT Treatments $Self Care/Home Management : 8-22 mins  Earlie RavelingStraub, Jamilynn Whitacre L OTR/L [phone] 03/09/2015, 3:41 PM

## 2015-03-09 NOTE — Progress Notes (Signed)
Patient ID: Breanna QuarryMaria T Bethune, female   DOB: [birth year], [age] y.o.   MRN: [id number]  SICU Evening Rounds:  Hemodynamically stable  Using PM valve.  Moving around more.  Urine output ok

## 2015-03-09 NOTE — Plan of Care (Signed)
Patient trying to climb out of chair constantly. Sitter needed. Obtained order for posey when up in chair for pt safety.

## 2015-03-09 NOTE — Progress Notes (Signed)
PULMONARY / CRITICAL CARE MEDICINE   Name: Breanna Alvarez MRN: [id number] DOB: 1950/11/26    ADMISSION DATE:  01/26/2015 CONSULTATION DATE:  02/09/2105  REFERRING MD :  Donata Clay   CHIEF COMPLAINT:  Vent management   INITIAL PRESENTATION:  64 yo female admitted with chest pain from CAD.  She had CABG 02/01/15 >> post-op complicated by cardiogenic shock s/p centrimag placement and removal, ischemic cardiomyopathy with EF 20%, open sternum.  She remained on vent post-op and PCCM consulted to assist with vent weaning.  STUDIES:  9/21 Echo - EF 20-25%, mod MR, PA press 51 mmHg 10/15 Echo - EF 25 to 30%, PAS 36 mmHg 10/31 Portable CXR - no new focal opacity or effusion.  CULTURES: Resp Ctx (10/24):  Negative Blood Ctx (10/18):  Negative Resp Ctx (10/17):  Negative Urine Ctx (10/17):  Negative Blood Ctx (10/13):  Negative Resp Ctx (10/12):  Negative Resp Ctx (10/4):  Negative  ANTIBIOTICS: Elita Quick 10/26>>> Zosyn 10/12 - 10/21.   SIGNIFICANT EVENTS: 09/26 CABG x 4, placement of centrimag L ventricular assistive device  09/30 removal centrimag  10/03 sternal closure 10/09 Hg drop overnight by 1 gm and transfusion ordered 10/10 Trach by DF 10/12 Off precedex 10/13 possible aspiration event; TNA started 10/14 difficulty with agitation 10/16 off pressors 10/17 off TNA 10/18 Fever 101, transfuse 1 unit PRBC 10/20 start trach collar trials; off milrinone; G tube placed 02/28/15 Tolerating trach collar at daytime. On vent at night. 03/01/15 atc x daytime. Sitting in chair. RN who had her before weekend reports no issues. More alert. Nods to simple Qs 03/03/15 Remains on TC 03/05/15 -  Now on PMV. Had to go back on vent briefly yesterday. Getting daily Nocturnal Vent per RN.   SUBJECTIVE: Patient continued on tracheostomy collar. Secretions are somewhat improved per nursing staff. Patient denies any chest pain or pressure. She denies any difficulty breathing. She denies any nausea.  Nurse reports patient slept well last night.  REVIEW OF SYSTEMS:  Unobtainable as patient has PMV out with secretions.   VITAL SIGNS: Temp:  [98.3 F (36.8 C)-99.7 F (37.6 C)] 99 F (37.2 C) (11/01 0750) Pulse Rate:  [88-125] 110 (11/01 0800) Resp:  [13-101] 33 (11/01 0800) BP: (75-145)/(36-70) 121/56 mmHg (11/01 0800) SpO2:  [94 %-100 %] 99 % (11/01 0800) FiO2 (%):  [28 %] 28 % (11/01 0750) Weight:  [83 lb 8.9 oz (37.9 kg)] 83 lb 8.9 oz (37.9 kg) (11/01 0356)   HEMODYNAMICS:     VENTILATOR SETTINGS: Vent Mode:  [-]  FiO2 (%):  [28 %] 28 %  INTAKE / OUTPUT:  Intake/Output Summary (Last 24 hours) at 03/09/15 0841 Last data filed at 03/09/15 0700  Gross per 24 hour  Intake 1417.5 ml  Output   1035 ml  Net  382.5 ml   PHYSICAL EXAMINATION: General:  Awake. Sitting up in chair. Watching TV. Integument:  Warm & dry. No rash on exposed skin.  HEENT:  Moist mucus membranes with minimal oral secretions. No scleral injection. Tracheostomy collar in place. Cardiovascular:  Regular rhythm and rate. Sinus rhythm on telemetry. No edema.  Pulmonary:  Diminished breath sounds bilateral lung bases. Normal work of breathing on tracheostomy collar. Abdomen: Soft. Normal bowel sounds. PEG tube in place. Musculoskeletal:  Normal bulk and tone. No joint deformity or effusion appreciated.  LABS:`PULMONARY No results for input(s): PHART, PCO2ART, PO2ART, HCO3, TCO2, O2SAT in the last 168 hours.  Invalid input(s): PCO2, PO2  CBC  Recent Labs Lab  03/07/15 0406 03/08/15 0430 03/09/15 0350  HGB 9.9* 9.6* 9.7*  HCT 32.6* 31.6* 32.0*  WBC 7.7 8.1 8.3  PLT 369 347 338     CHEMISTRY  Recent Labs Lab 03/05/15 0410 03/06/15 0415 03/07/15 0406 03/08/15 0430 03/09/15 0350  NA 143 145 146* 148* 149*  K 3.7 3.3* 3.2* 3.7 3.7  CL 103 104 105 109 110  CO2 26 26 27 28 30   GLUCOSE 192* 111* 161* 201* 193*  BUN 52* 55* 56* 63* 62*  CREATININE 1.22* 1.09* 1.10* 1.06* 1.16*  CALCIUM  9.2 9.4 9.6 9.3 9.4  MG  --   --   --  2.2  --    Estimated Creatinine Clearance: 30.1 mL/min (by C-G formula based on Cr of 1.16).  LIVER  Recent Labs Lab 03/03/15 0418 03/08/15 0430  AST 35 22  ALT 58* 34  ALKPHOS 135* 103  BILITOT 1.7* 1.3*  PROT 6.8 7.6  ALBUMIN 2.4* 2.5*   ENDOCRINE CBG (last 3)   Recent Labs  03/08/15 2324 03/09/15 0349 03/09/15 0801  GLUCAP 131* 162* 144*    Iron/TIBC/Ferritin/ %Sat    Component Value Date/Time   IRON 19* 02/22/2015 0900   TIBC 143* 02/22/2015 0900   FERRITIN 1025* 02/22/2015 0900   IRONPCTSAT 13 02/22/2015 0900    IMAGING x48h Dg Chest Port 1 View  03/09/2015  CLINICAL DATA:  CHF, shortness of breath, status post CABG on February 01, 2015 EXAM: PORTABLE CHEST 1 VIEW COMPARISON:  Portable chest x-ray of March 08, 2015 FINDINGS: The lungs are mildly hypoinflated. The interstitial markings remain increased bilaterally. Areas complaint put alveolar opacity persist inferiorly in the right upper lobe. The cardiac silhouette remains enlarged. The pulmonary vascularity is engorged and indistinct. There are 7 intact sternal wires. The tracheostomy appliance tip lies at the level of the inferior margin of the clavicular heads approximately 2.8 cm above the carina. The left internal jugular venous catheter tip projects over the junction of the middle and distal portions of the SVC. IMPRESSION: Mild hypoinflation today as compared to yesterday's study. The pulmonary interstitial markings remain increased consistent with interstitial edema. The support tubes are in reasonable position. Electronically Signed   By: David  SwazilandJordan M.D.   On: 03/09/2015 07:22   Dg Chest Port 1 View  03/08/2015  CLINICAL DATA:  64 year old female status post CABG complicated by cardiogenic shock. ICU recovery. Ventilation weaning. Initial encounter. EXAM: PORTABLE CHEST 1 VIEW COMPARISON:  03/05/2015 and earlier. FINDINGS: Portable AP semi upright view at 0601  hours. Stable tracheostomy tube. Stable cardiac size and mediastinal contours. Sequelae of CABG. Left subclavian approach PICC line is stable. Stable lung volumes. No pneumothorax. No consolidation. Decreased pulmonary vascularity without edema. No definite effusion. IMPRESSION: 1.  Stable lines and tubes. 2. Stable to mildly improved ventilation, decreased pulmonary vascular congestion. Electronically Signed   By: Odessa FlemingH  Hall M.D.   On: 03/08/2015 07:40    ASSESSMENT / PLAN: 64 year old female status post CABG with acute hypoxic respiratory failure. Secretions are improving with scopolamine patch. Patient continuing on treatment for healthcare associated pneumonia without adverse effect. Patient's degree of hypernatremia is worsening somewhat. I would prefer to avoid adding any additional free water given her history of cardiomyopathy.  1. Acute on chronic hypoxic respiratory failure: Secondary to aspiration pneumonia & pulmonary edema from cardiogenic shock after CABG. Status post tracheostomy placement on 10/10 by Dr. Tyson AliasFeinstein. Continuing on tracheostomy & T bar as needed for secretions. Continuing scopolamine patch which seems  to be improving patient's oral secretions. 2. Aspiration pneumonia/HCAP: Repeat culture from 10/24 negative. Continuing Fortaz empirically for coverage day #7 Foley catheter removed./10. 3. Cardiogenic shock: Resolved. 4. Acute renal failure: Resolved. Continuing to trend urine output. Monitoring renal function with daily BUN/creatinine. 5. Hypokalemia: Resolved. Continuing to monitor electrolytes daily. 6. Hypernatremia: Mild but worsening. Discontinuing Lasix for now Continuing fluid flushes with tube feeds. 7. Coronary artery disease w/ ICM: Status post CABG. Currently on aspirin, Lopressor, nitroglycerin patch, & hydralazine. 8. Protein calorie malnutrition: Continuing tube feeds per dietary recommendations. 9. Anemia: Hemoglobin stable. No signs of active bleeding.  Continuing to monitor global daily with CBC. 10. Delirium: Multifactorial but improving. Continuing Klonopin twice a day & Seroquel. 11. Prophylaxis: Lovenox subcutaneous daily, SCDs, & Protonix via tube. 12. Disposition: Medical social worker arranging for transition to a local facility at husband's request. PT/OT/SLP consulted.  Donna Christen Jamison Neighbor, M.D. [facility] Pager:  [phone] After 3pm or if no response, call [phone]  03/09/2015 8:41 AM

## 2015-03-09 NOTE — Progress Notes (Signed)
29 Days Post-Op Procedure(s) (LRB): STERNAL CLOSURE (N/A) TRANSESOPHAGEAL ECHOCARDIOGRAM (TEE) (N/A) Subjective: Remains on trach with excessive secretions, using PM valve Able to stand CXR clear nsr Working toward SNF Objective: Vital signs in last 24 hours: Temp:  [98.3 F (36.8 C)-99.7 F (37.6 C)] 99 F (37.2 C) (11/01 0750) Pulse Rate:  [99-125] 108 (11/01 1000) Cardiac Rhythm:  [-] Sinus tachycardia (11/01 0800) Resp:  [13-101] 31 (11/01 1000) BP: (75-145)/(36-99) 103/51 mmHg (11/01 1000) SpO2:  [94 %-100 %] 100 % (11/01 1000) FiO2 (%):  [28 %] 28 % (11/01 0750) Weight:  [83 lb 8.9 oz (37.9 kg)] 83 lb 8.9 oz (37.9 kg) (11/01 0356)  Hemodynamic parameters for last 24 hours:  afebrile  Intake/Output from previous day: 10/31 0701 - 11/01 0700 In: 1477.5 [I.V.:245; NG/GT:1042.5; IV Piggyback:100] Out: 1160 [Urine:1160] Intake/Output this shift: Total I/O In: 120 [I.V.:20; NG/GT:100] Out: -   Incisions clean No pedal edema  Lab Results:  Recent Labs  03/08/15 0430 03/09/15 0350  WBC 8.1 8.3  HGB 9.6* 9.7*  HCT 31.6* 32.0*  PLT 347 338   BMET:  Recent Labs  03/08/15 0430 03/09/15 0350  NA 148* 149*  K 3.7 3.7  CL 109 110  CO2 28 30  GLUCOSE 201* 193*  BUN 63* 62*  CREATININE 1.06* 1.16*  CALCIUM 9.3 9.4    PT/INR: No results for input(s): LABPROT, INR in the last 72 hours. ABG    Component Value Date/Time   PHART 7.438 02/26/2015 0845   HCO3 28.1* 02/26/2015 0845   TCO2 24 02/26/2015 1554   ACIDBASEDEF 1.6 02/23/2015 0420   O2SAT 70.9 02/28/2015 0511   CBG (last 3)   Recent Labs  03/08/15 2324 03/09/15 0349 03/09/15 0801  GLUCAP 131* 162* 144*    Assessment/Plan: S/P Procedure(s) (LRB): STERNAL CLOSURE (N/A) TRANSESOPHAGEAL ECHOCARDIOGRAM (TEE) (N/A) Mobilize Diuresis cont Fortaz for 10 days total To stepdown when she can walk with assist x 1  LOS: 42 days    Kathlee Nationseter Van Trigt III 03/09/2015

## 2015-03-10 ENCOUNTER — Inpatient Hospital Stay (HOSPITAL_COMMUNITY): Payer: Medicaid Other

## 2015-03-10 DIAGNOSIS — R410 Disorientation, unspecified: Secondary | ICD-10-CM

## 2015-03-10 LAB — GLUCOSE, CAPILLARY
GLUCOSE-CAPILLARY: 140 mg/dL — AB (ref 65–99)
GLUCOSE-CAPILLARY: 204 mg/dL — AB (ref 65–99)
GLUCOSE-CAPILLARY: 85 mg/dL (ref 65–99)
Glucose-Capillary: 111 mg/dL — ABNORMAL HIGH (ref 65–99)
Glucose-Capillary: 117 mg/dL — ABNORMAL HIGH (ref 65–99)
Glucose-Capillary: 159 mg/dL — ABNORMAL HIGH (ref 65–99)

## 2015-03-10 LAB — URINALYSIS, ROUTINE W REFLEX MICROSCOPIC
Bilirubin Urine: NEGATIVE
GLUCOSE, UA: NEGATIVE mg/dL
Ketones, ur: NEGATIVE mg/dL
LEUKOCYTES UA: NEGATIVE
Nitrite: NEGATIVE
PH: 6 (ref 5.0–8.0)
PROTEIN: 100 mg/dL — AB
SPECIFIC GRAVITY, URINE: 1.021 (ref 1.005–1.030)
Urobilinogen, UA: 0.2 mg/dL (ref 0.0–1.0)

## 2015-03-10 LAB — RENAL FUNCTION PANEL
ANION GAP: 8 (ref 5–15)
Albumin: 2.5 g/dL — ABNORMAL LOW (ref 3.5–5.0)
BUN: 50 mg/dL — AB (ref 6–20)
CHLORIDE: 109 mmol/L (ref 101–111)
CO2: 30 mmol/L (ref 22–32)
Calcium: 9.7 mg/dL (ref 8.9–10.3)
Creatinine, Ser: 0.96 mg/dL (ref 0.44–1.00)
GFR calc Af Amer: >60 mL/min (ref >60)
Glucose, Bld: 127 mg/dL — ABNORMAL HIGH (ref 65–99)
POTASSIUM: 4 mmol/L (ref 3.5–5.1)
Phosphorus: 4.4 mg/dL (ref 2.5–4.6)
Sodium: 147 mmol/L — ABNORMAL HIGH (ref 135–145)

## 2015-03-10 LAB — URINE MICROSCOPIC-ADD ON

## 2015-03-10 NOTE — Progress Notes (Addendum)
[facility] DAILY ICU PROGRESS NOTE                   [address]            [place] [address]          [phone]   30 Days Post-Op Procedure(s) (LRB): STERNAL CLOSURE (N/A) TRANSESOPHAGEAL ECHOCARDIOGRAM (TEE) (N/A)  Total Length of Stay:  LOS: 43 days   Subjective:  Breanna Alvarez complains of a headache.  Per nursing she has not urinated since 10 pm last night.  She also had an episode of incontinence.  Bladder scan showed about 250cc urine present  Objective: Vital signs in last 24 hours: Temp:  [96.9 F (36.1 C)-99 F (37.2 C)] 99 F (37.2 C) (11/02 0725) Pulse Rate:  [92-125] 109 (11/02 0727) Cardiac Rhythm:  [-] Sinus tachycardia (11/02 0700) Resp:  [13-33] 16 (11/02 0727) BP: (83-152)/(47-99) 135/62 mmHg (11/02 0727) SpO2:  [95 %-100 %] 95 % (11/02 0727) FiO2 (%):  [28 %] 28 % (11/02 0727) Weight:  [81 lb 9.1 oz (37 kg)] 81 lb 9.1 oz (37 kg) (11/02 0600)  Filed Weights   03/08/15 0437 03/09/15 0356 03/10/15 0600  Weight: 85 lb 5.1 oz (38.7 kg) 83 lb 8.9 oz (37.9 kg) 81 lb 9.1 oz (37 kg)    Weight change: -1 lb 15.8 oz (-0.9 kg)   Intake/Output from previous day: 11/01 0701 - 11/02 0700 In: 1545 [I.V.:255; NG/GT:1150; IV Piggyback:100] Out: 200 [Urine:200]  Current Meds: Scheduled Meds: . albuterol  2.5 mg Nebulization Q6H  . antiseptic oral rinse  7 mL Mouth Rinse QID  . aspirin EC  325 mg Oral Daily   Or  . aspirin  324 mg Per Tube Daily  . cefTAZidime (FORTAZ)  IV  2 g Intravenous Q12H  . chlorhexidine gluconate  15 mL Mouth Rinse BID  . clonazePAM  1 mg Oral BID  . digoxin  0.0625 mg Oral Daily  . enoxaparin (LOVENOX) injection  30 mg Subcutaneous Q24H  . hydrALAZINE  10 mg Oral 3 times per day  . insulin aspart  0-24 Units Subcutaneous 6 times per day  . lactobacillus acidophilus  2 tablet Oral BID  . metoprolol tartrate  25 mg Oral BID  . nitroGLYCERIN  0.4 mg Transdermal Daily  . pantoprazole sodium  40 mg Per Tube Daily  .  QUEtiapine  100 mg Oral QHS  . scopolamine  1 patch Transdermal Q72H  . sodium chloride  10-40 mL Intracatheter Q12H   Continuous Infusions: . sodium chloride 10 mL/hr at 03/09/15 1900  . feeding supplement (JEVITY 1.2 CAL) 1,000 mL (03/09/15 1900)   PRN Meds:.acetaminophen (TYLENOL) oral liquid 160 mg/5 mL, Gerhardt's butt cream, magic mouthwash, ondansetron (ZOFRAN) IV, oxyCODONE, sodium chloride  General appearance: alert, cooperative and no distress Heart: regular rate and rhythm Lungs: clear to auscultation bilaterally Abdomen: soft, non-tender; bowel sounds normal; no masses,  no organomegaly Extremities: edema no edema Wound: clean and dry  Lab Results: CBC: Recent Labs  03/08/15 0430 03/09/15 0350  WBC 8.1 8.3  HGB 9.6* 9.7*  HCT 31.6* 32.0*  PLT 347 338   BMET:  Recent Labs  03/08/15 0430 03/09/15 0350  NA 148* 149*  K 3.7 3.7  CL 109 110  CO2 28 30  GLUCOSE 201* 193*  BUN 63* 62*  CREATININE 1.06* 1.16*  CALCIUM 9.3 9.4    PT/INR: No results for input(s): LABPROT, INR in the last 72 hours.  Radiology: No results found.   Assessment/Plan: S/P Procedure(s) (LRB): STERNAL CLOSURE (N/A) TRANSESOPHAGEAL ECHOCARDIOGRAM (TEE) (N/A)  1. CV- hemodynamically stable- continue Dig, Hydralazine, Lopressor, NTG 2. Pulm- tolerating trach, continues to to have thickened secretions, using PM 3. Renal- creatinine remains stable, no edema present 4. GU- foley removed 48 hours ago, patient incontinent of urine at times, has not urinated since 10 pm last night- will get UA/culture, will start Flomax 5. ID- on Fortaz, Day 9/10 6. Deconditioning- needs SNF at discharge 7. Dispo- patient now with urinary retention, could be UTI with episodes of incontinence since foley removal will get UA, continue Fortaz, ambulate, hopefully to 2W soon     Breanna Alvarez, Breanna Alvarez 03/10/2015 7:58 AM  Plan swallow study with [facility]ES [place] [facility] [place] to [place]ach, not 2 [place] Cont Fortaz 10 days  total  patient examined and medical record reviewed,agree with above note. Breanna Alvarez 03/10/2015

## 2015-03-10 NOTE — Progress Notes (Addendum)
PULMONARY / CRITICAL CARE MEDICINE   Name: Breanna Alvarez MRN: [id number] DOB: December 13, 1950    ADMISSION DATE:  01/26/2015 CONSULTATION DATE:  02/09/2105  REFERRING MD :  Donata Clay   CHIEF COMPLAINT:  Vent management   INITIAL PRESENTATION:  64 yo female admitted with chest pain from CAD.  She had CABG 02/01/15 >> post-op complicated by cardiogenic shock s/p centrimag placement and removal, ischemic cardiomyopathy with EF 20%, open sternum.  She remained on vent post-op and PCCM consulted to assist with vent weaning.  STUDIES:  9/21 Echo - EF 20-25%, mod MR, PA press 51 mmHg 10/15 Echo - EF 25 to 30%, PAS 36 mmHg 10/31 Portable CXR - no new focal opacity or effusion.  CULTURES: Resp Ctx (10/24):  Negative Blood Ctx (10/18):  Negative Resp Ctx (10/17):  Negative Urine Ctx (10/17):  Negative Blood Ctx (10/13):  Negative Resp Ctx (10/12):  Negative Resp Ctx (10/4):  Negative  ANTIBIOTICS: Elita Quick 10/26>>> Zosyn 10/12 - 10/21.   SIGNIFICANT EVENTS: 09/26 CABG x 4, placement of centrimag L ventricular assistive device  09/30 removal centrimag  10/03 sternal closure 10/09 Hg drop overnight by 1 gm and transfusion ordered 10/10 Trach by DF 10/12 Off precedex 10/13 possible aspiration event; TNA started 10/14 difficulty with agitation 10/16 off pressors 10/17 off TNA 10/18 Fever 101, transfuse 1 unit PRBC 10/20 start trach collar trials; off milrinone; G tube placed 02/28/15 Tolerating trach collar at daytime. On vent at night. 03/01/15 atc x daytime. Sitting in chair. RN who had her before weekend reports no issues. More alert. Nods to simple Qs 03/03/15 Remains on TC 03/05/15 -  Now on PMV. Had to go back on vent briefly yesterday. Getting daily Nocturnal Vent per RN.   SUBJECTIVE: Secretions have been improving. Patient has been attempting to get out of chair regularly and has required a sitter now. No acute events overnight. Patient denies any chest pain or pressure. She  denies any difficulty breathing. She is currently on tracheostomy collar.  REVIEW OF SYSTEMS:  Patient denies any nausea. She does endorse some diffuse abdominal pain centered around her prior chest tube site & PEG tube. No subjective fever or chills.  VITAL SIGNS: Temp:  [96.9 F (36.1 C)-99 F (37.2 C)] 99 F (37.2 C) (11/02 0725) Pulse Rate:  [92-118] 106 (11/02 1000) Resp:  [13-30] 26 (11/02 0900) BP: (83-152)/(47-81) 133/52 mmHg (11/02 1000) SpO2:  [95 %-100 %] 100 % (11/02 1000) FiO2 (%):  [28 %] 28 % (11/02 0727) Weight:  [81 lb 9.1 oz (37 kg)] 81 lb 9.1 oz (37 kg) (11/02 0600)   HEMODYNAMICS:     VENTILATOR SETTINGS: Vent Mode:  [-]  FiO2 (%):  [28 %] 28 %  INTAKE / OUTPUT:  Intake/Output Summary (Last 24 hours) at 03/10/15 1041 Last data filed at 03/10/15 0900  Gross per 24 hour  Intake   1575 ml  Output    200 ml  Net   1375 ml   PHYSICAL EXAMINATION: General:  Awake. Sitting up in chair. Watching TV. Sitter at bedside. Integument:  Warm & dry. No rash on exposed skin.  HEENT:  Moist mucus membranes. No scleral injection. Tracheostomy in place. Cardiovascular:  Regular rhythm and rate. No edema.  Pulmonary:  Improving aeration bilateral lung bases. Normal work of breathing off tracheostomy collar. Abdomen: Soft. Normal bowel sounds. PEG tube in place. Mild tenderness to palpation in epigastrium Musculoskeletal:  Normal bulk and tone. No joint deformity or effusion appreciated.  LABS:`PULMONARY No results for input(s): PHART, PCO2ART, PO2ART, HCO3, TCO2, O2SAT in the last 168 hours.  Invalid input(s): PCO2, PO2  CBC  Recent Labs Lab 03/07/15 0406 03/08/15 0430 03/09/15 0350  HGB 9.9* 9.6* 9.7*  HCT 32.6* 31.6* 32.0*  WBC 7.7 8.1 8.3  PLT 369 347 338     CHEMISTRY  Recent Labs Lab 03/06/15 0415 03/07/15 0406 03/08/15 0430 03/09/15 0350 03/10/15 0845  NA 145 146* 148* 149* 147*  K 3.3* 3.2* 3.7 3.7 4.0  CL 104 105 109 110 109  CO2 26 27 28  30 30   GLUCOSE 111* 161* 201* 193* 127*  BUN 55* 56* 63* 62* 50*  CREATININE 1.09* 1.10* 1.06* 1.16* 0.96  CALCIUM 9.4 9.6 9.3 9.4 9.7  MG  --   --  2.2  --   --   PHOS  --   --   --   --  4.4   Estimated Creatinine Clearance: 35.5 mL/min (by C-G formula based on Cr of 0.96).  LIVER  Recent Labs Lab 03/08/15 0430 03/10/15 0845  AST 22  --   ALT 34  --   ALKPHOS 103  --   BILITOT 1.3*  --   PROT 7.6  --   ALBUMIN 2.5* 2.5*   ENDOCRINE CBG (last 3)   Recent Labs  03/09/15 2356 03/10/15 0349 03/10/15 0722  GLUCAP 111* 140* 117*    Iron/TIBC/Ferritin/ %Sat    Component Value Date/Time   IRON 19* 02/22/2015 0900   TIBC 143* 02/22/2015 0900   FERRITIN 1025* 02/22/2015 0900   IRONPCTSAT 13 02/22/2015 0900    IMAGING x48h Dg Chest Port 1 View  03/09/2015  CLINICAL DATA:  CHF, shortness of breath, status post CABG on February 01, 2015 EXAM: PORTABLE CHEST 1 VIEW COMPARISON:  Portable chest x-ray of March 08, 2015 FINDINGS: The lungs are mildly hypoinflated. The interstitial markings remain increased bilaterally. Areas complaint put alveolar opacity persist inferiorly in the right upper lobe. The cardiac silhouette remains enlarged. The pulmonary vascularity is engorged and indistinct. There are 7 intact sternal wires. The tracheostomy appliance tip lies at the level of the inferior margin of the clavicular heads approximately 2.8 cm above the carina. The left internal jugular venous catheter tip projects over the junction of the middle and distal portions of the SVC. IMPRESSION: Mild hypoinflation today as compared to yesterday's study. The pulmonary interstitial markings remain increased consistent with interstitial edema. The support tubes are in reasonable position. Electronically Signed   By: David  SwazilandJordan M.D.   On: 03/09/2015 07:22    ASSESSMENT / PLAN: 64 year old female status post CABG with acute hypoxic respiratory failure. Secretions are improving with  scopolamine patch. Patient continuing on treatment for healthcare associated pneumonia without adverse effect. Hypernatremia improving off of Lasix. I suspect the decreased urine output is likely secondary to discontinuation of the catheter as well as discontinuing Lasix at this time. Patient planned to transition to a stepdown unit. Abdominal pain of unclear significance and possibly secondary to the patient being more awake and alert.  331. 64 year old female status post CABG with acute hypoxic respiratory failure. Secretions are improving with scopolamine patch. Patient continuing on treatment for healthcare associated pneumonia. Tracheostomy placement on 10/10 by Dr. Tyson AliasFeinstein. Continuing on tracheostomy. Continuing scopolamine patch which seems to be improving patient's oral secretions. 2. Aspiration pneumonia/HCAP: Repeat culture from 10/24 negative. Continuing Fortaz empirically for coverage day #8/10. 3. Hypernatremia: Mild but improving. Continuing to hold Lasix. Repeat Renal Panel on  11/4. 4. Cardiogenic shock: Resolved. 5. Acute renal failure: Resolved. Continuing to trend urine output. Monitoring renal function with daily BUN/creatinine. 6. Hypokalemia: Resolved.  7. Coronary artery disease w/ ICM: Status post CABG. Currently on aspirin, Lopressor, nitroglycerin patch, & hydralazine. 8. Protein calorie malnutrition: Continuing tube feeds per dietary recommendations. 9. Anemia: Hemoglobin stable. No signs of active bleeding. Repeat CBC on 11/4. 10. Delirium: Multifactorial but improving. Continuing Klonopin twice a day & Seroquel. 11. Prophylaxis: Lovenox subcutaneous daily, SCDs, & Protonix via tube. 12. Disposition: Medical social worker arranging for transition to a local facility. PT/OT/SLP consulted.  Donna Christen Jamison Neighbor, M.D. [facility] Pager:  [phone] After 3pm or if no response, call [phone]  03/10/2015 10:41 AM

## 2015-03-10 NOTE — Progress Notes (Signed)
Physical Therapy Treatment Patient Details Name: Breanna Alvarez MRN: [id number] DOB: [birth year] Today's Date: 03/10/2015    History of Present Illness Breanna Alvarez is a 64 y.o. female who has PMHx of CAD, CHF and had an NSTEMI in ealy 2016, present to ED with ongoing CP.  She is now s/p CABG as of 9/27 with Centrimag placement, 9/30 Centrimag removal, 10/3 sternal closure, Wound I and D 10/4, failed extubation 10/7, 10/10 trached, ?aspiration on 10/13 and worsening status.     First attempt wean to [facility]. 10/19..  Using PMV as of 10/28.    PT Comments    Progressing steadily.  Now maintaining posture without assist, moving better without UE assist.  Gait stability improving as well as endurance.   Follow Up Recommendations  SNF     Equipment Recommendations  None recommended by PT    Recommendations for Other Services       Precautions / Restrictions Precautions Precautions: Fall;Sternal    Mobility  Bed Mobility               General bed mobility comments: already in the recliner  Transfers Overall transfer level: Needs assistance Equipment used: Rolling walker (2 wheeled) Transfers: Sit to/from Stand Sit to Stand: Mod assist         General transfer comment: assist to come forward and stability assist  Ambulation/Gait Ambulation/Gait assistance: Min assist;+2 physical assistance Ambulation Distance (Feet): 70 Feet Assistive device: Rolling walker (2 wheeled) Gait Pattern/deviations: Step-through pattern;Decreased step length - right;Decreased step length - left;Ataxic Gait velocity: slow   General Gait Details: generally unsteady small steps   Stairs            Wheelchair Mobility    Modified Rankin (Stroke Patients Only)       Balance   Sitting-balance support: No upper extremity supported;Single extremity supported Sitting balance-Leahy Scale: Fair       Standing balance-Leahy Scale: Poor Standing balance comment: still needing  assist of RW, but pt needs AD for stability, not full support.                    Cognition Arousal/Alertness: Awake/alert Behavior During Therapy: Restless Overall Cognitive Status: No family/caregiver present to determine baseline cognitive functioning Area of Impairment: Attention;Following commands;Safety/judgement;Awareness;Problem solving Orientation Level: Situation;Time Current Attention Level: Sustained   Following Commands: Follows one step commands consistently;Follows one step commands with increased time Safety/Judgement: Decreased awareness of safety Awareness: Intellectual Problem Solving: Slow processing;Requires verbal cues      Exercises      General Comments General comments (skin integrity, edema, etc.): VSS throughout, sats in ght lower 90's on RA and EHR in the 100's      Pertinent Vitals/Pain Pain Assessment: No/denies pain    Home Living                      Prior Function            PT Goals (current goals can now be found in the care plan section) Acute Rehab PT Goals Patient Stated Goal: not stated PT Goal Formulation: Patient unable to participate in goal setting Time For Goal Achievement: 03/12/15 Potential to Achieve Goals: Fair Progress towards PT goals: Progressing toward goals    Frequency  Min 3X/week    PT Plan Current plan remains appropriate    Co-evaluation             End of Session   Activity Tolerance: Patient  tolerated treatment well Patient left: in chair;with call bell/phone within reach;with nursing/sitter in room;with family/visitor present     Time: [id number] PT Time Calculation (min) (ACUTE ONLY): 22 min  Charges:  $Gait Training: 8-22 mins $Therapeutic Activity: 8-22 mins                    G Codes:      Janit Cutter, Eliseo GumKenneth V 03/10/2015, 5:22 PM

## 2015-03-11 LAB — GLUCOSE, CAPILLARY
GLUCOSE-CAPILLARY: 107 mg/dL — AB (ref 65–99)
GLUCOSE-CAPILLARY: 118 mg/dL — AB (ref 65–99)
GLUCOSE-CAPILLARY: 155 mg/dL — AB (ref 65–99)
GLUCOSE-CAPILLARY: 169 mg/dL — AB (ref 65–99)
GLUCOSE-CAPILLARY: 72 mg/dL (ref 65–99)
Glucose-Capillary: 158 mg/dL — ABNORMAL HIGH (ref 65–99)
Glucose-Capillary: 132 mg/dL — ABNORMAL HIGH (ref 65–99)

## 2015-03-11 LAB — URINE CULTURE: Culture: 2000

## 2015-03-11 MED ORDER — STARCH (THICKENING) PO POWD: ORAL | Status: DC | PRN

## 2015-03-11 MED ORDER — RESOURCE THICKENUP CLEAR PO POWD
ORAL | Status: DC | PRN
  Filled 2015-03-11: qty 125

## 2015-03-11 NOTE — Progress Notes (Signed)
RN reports no acute events overnight. Patient ambulated in the hallway for 45 feet with nursing staff. Tolerating Sanmina-SCIPassey Muir valve. Secretions continuing to improve. Checking EKG tomorrow morning to monitor QTc on Seroquel. Plan to reassess in the morning.  Breanna Alvarez, M.D. [place] [facility] Pager:  [phone] After 3pm or if no response, call [phone]

## 2015-03-11 NOTE — Progress Notes (Signed)
Every other staple was removed from patient's sternal incision.  Patient tolerated the procedure well and did not complain of pain.  Will continue to monitor the patient carefully.

## 2015-03-11 NOTE — Progress Notes (Signed)
Speech Language Pathology Treatment: Dysphagia;Passy Muir Speaking valve  Patient Details Name: Breanna Alvarez MRN: [id number] DOB: 1951/04/11 Today's Date: 03/11/2015 Time: 1350-1400 SLP Time Calculation (min) (ACUTE ONLY): 10 min  Assessment / Plan / Recommendation Clinical Impression  Cuff was deflated and PMSV placed prior to treatment session, no air trapping noted at checks throughout session. Pt did not expectorate any secretions throughout the trial.Treatment focused on PMSV and dysphagia. Pt's vitals were stable throughout session.  Pt attained slighty reduced volume but was intelligible at the conversational level around 90%. Pt demonstrates language of confusion and thoughts are not cohesive. SLP provided minimal cues to increase volume and take small sips. Pt fed self with assist. Pt tolerated dysphagia 1 (puree) and nectar thick liquids, coughed x2 during session, per sitter pt has been coughing throughout the day. Suspect that coughing is related to secretions not pos. Will continue to monitor closely and follow up with check for diet tolerance and upgrade diet as pt's overall health improves. Consider changing trach to cuffless and CIR.    HPI Other Pertinent Information: 64 yo female never smoker with hx HTN, CAD previously refused CABG ultimately admitted 9/20 for CVTS eval and CABG after ongoing chest pain. On 02/01/15 she had a CORONARY ARTERY BYPASS GRAFTING (CABG), MITRAL VALVE REPAIR (MVR), and TRANSESOPHAGEAL ECHOCARDIOGRAM (TEE) and has had complicated course with cardiogenic shock s/p centrimag placement (temporary LVAD) and removal (on 9/30), ischemic cardiomyopathy with EF 20%, open sternum now s/p sternal closure 10/3. She remained intubated since initial CABG 9/26 with failure to wean, she was trached (6mm Shiley) on 02/15/15, trach collar trials started. She had a possible aspiration event from tube feeds on 10/13, PEG placed 10/19.    Pertinent Vitals Pain Assessment:  Faces Faces Pain Scale: No hurt  SLP Plan  Continue with current plan of care    Recommendations Diet recommendations: Dysphagia 1 (puree);Nectar-thick liquid Liquids provided via: Cup Medication Administration: Whole meds with puree Supervision: Full supervision/cueing for compensatory strategies;Staff to assist with self feeding Compensations: Slow rate;Small sips/bites;Multiple dry swallows after each bite/sip;Minimize environmental distractions Postural Changes and/or Swallow Maneuvers: Seated upright 90 degrees      Patient may use Passy-Muir Speech Valve: During all waking hours (remove during sleep);During all therapies with supervision PMSV Supervision: Intermittent MD: Please consider changing trach tube to : Cuffless       Oral Care Recommendations: Oral care QID Follow up Recommendations: Inpatient Rehab Plan: Continue with current plan of care    GO    Riccardo DubinKristen Alakai Macbride, Student-SLP  Riccardo DubinKristen Zaylin Runco 03/11/2015, 2:12 PM

## 2015-03-11 NOTE — Progress Notes (Addendum)
TCTS DAILY ICU PROGRESS NOTE                   [address]            [place] [address]          [phone]   31 Days Post-Op Procedure(s) (LRB): STERNAL CLOSURE (N/A) TRANSESOPHAGEAL ECHOCARDIOGRAM (TEE) (N/A)  Total Length of Stay:  LOS: 44 days   Subjective:  Ms. Gruetzmacher has no new complaints.  She is ambulating with assistance.    Objective: Vital signs in last 24 hours: Temp:  [97.8 F (36.6 C)-99.5 F (37.5 C)] 99.5 F (37.5 C) (11/03 0754) Pulse Rate:  [89-123] 117 (11/03 0800) Cardiac Rhythm:  [-] Sinus tachycardia (11/03 0800) Resp:  [12-32] 27 (11/03 0800) BP: (96-157)/(46-92) 157/77 mmHg (11/03 0800) SpO2:  [93 %-100 %] 100 % (11/03 0800) FiO2 (%):  [28 %] 28 % (11/02 1000) Weight:  [98 lb 15.8 oz (44.9 kg)] 98 lb 15.8 oz (44.9 kg) (11/03 0600)  Filed Weights   03/09/15 0356 03/10/15 0600 03/11/15 0600  Weight: 83 lb 8.9 oz (37.9 kg) 81 lb 9.1 oz (37 kg) 98 lb 15.8 oz (44.9 kg)    Weight change: 17 lb 6.7 oz (7.9 kg)   Intake/Output from previous day: 11/02 0701 - 11/03 0700 In: 1810 [I.V.:240; NG/GT:1200; IV Piggyback:100] Out: 900 [Urine:900]  Intake/Output this shift: Total I/O In: 60 [I.V.:10; NG/GT:50] Out: -   Current Meds: Scheduled Meds: . albuterol  2.5 mg Nebulization Q6H  . antiseptic oral rinse  7 mL Mouth Rinse QID  . aspirin EC  325 mg Oral Daily   Or  . aspirin  324 mg Per Tube Daily  . cefTAZidime (FORTAZ)  IV  2 g Intravenous Q12H  . chlorhexidine gluconate  15 mL Mouth Rinse BID  . clonazePAM  1 mg Oral BID  . digoxin  0.0625 mg Oral Daily  . enoxaparin (LOVENOX) injection  30 mg Subcutaneous Q24H  . hydrALAZINE  10 mg Oral 3 times per day  . insulin aspart  0-24 Units Subcutaneous 6 times per day  . lactobacillus acidophilus  2 tablet Oral BID  . metoprolol tartrate  25 mg Oral BID  . nitroGLYCERIN  0.4 mg Transdermal Daily  . pantoprazole sodium  40 mg Per Tube Daily  . QUEtiapine  100 mg Oral QHS    . scopolamine  1 patch Transdermal Q72H  . sodium chloride  10-40 mL Intracatheter Q12H   Continuous Infusions: . sodium chloride 10 mL/hr at 03/11/15 0800  . feeding supplement (JEVITY 1.2 CAL) 50 mL/hr (03/11/15 0839)   PRN Meds:.acetaminophen (TYLENOL) oral liquid 160 mg/5 mL, Gerhardt's butt cream, magic mouthwash, ondansetron (ZOFRAN) IV, oxyCODONE, sodium chloride  General appearance: alert, cooperative and no distress Heart: regular rate and rhythm Lungs: clear to auscultation bilaterally Abdomen: soft, non-tender; bowel sounds normal; no masses,  no organomegaly Extremities: edema none  Wound: clean and dry  Lab Results: CBC: Recent Labs  03/09/15 0350  WBC 8.3  HGB 9.7*  HCT 32.0*  PLT 338   BMET:  Recent Labs  03/09/15 0350 03/10/15 0845  NA 149* 147*  K 3.7 4.0  CL 110 109  CO2 30 30  GLUCOSE 193* 127*  BUN 62* 50*  CREATININE 1.16* 0.96  CALCIUM 9.4 9.7    PT/INR: No results for input(s): LABPROT, INR in the last 72 hours. Radiology: Dg Swallowing Func-speech Pathology  03/10/2015  Objective Swallowing Evaluation:  Patient Details Name: Demmi T Blunck MRN: [id number] Date of Birth: 08-Aug-1950 Today's Date: 03/10/2015 Time: SLP Start Time (ACUTE ONLY): 1100-SLP Stop Time (ACUTE ONLY): 1130 SLP Time Calculation (min) (ACUTE ONLY): 30 min Past Medical History: Past Medical History Diagnosis Date . Hyperlipidemia  . Acute systolic CHF (congestive heart failure), NYHA class 3 (HCC) 07/10/2014 . Hypertension  Past Surgical History: Past Surgical History Procedure Laterality Date . No past surgeries   . Left heart catheterization with coronary angiogram N/A 07/13/2014   Procedure: LEFT HEART CATHETERIZATION WITH CORONARY ANGIOGRAM;  Surgeon: Jayadeep S Varanasi, MD; mLAD 95%, dLAD 95%, D1 95%, CFX severe dz, mRCA 80% . Coronary artery bypass graft N/A 02/01/2015   Procedure: CORONARY ARTERY BYPASS GRAFTING (CABG) X 4 UTILIZING THE LEFT INTERNAL MAMMARY ARTERY TO LAD,  ENDOSCOPICALLY HARVESTED BILATERAL SAPHENEOUS VEIN GRAFTS  TO DIAGONAL, OM AND PD.;  Surgeon: Rokhaya Quinn Van Trigt, MD;  Location: [facility] OR;  Service: Open Heart Surgery;  Laterality: N/A; . Tee without cardioversion N/A 02/01/2015   Procedure: TRANSESOPHAGEAL ECHOCARDIOGRAM (TEE);  Surgeon: Shirline Kendle Van Trigt, MD;  Location: [facility] OR;  Service: Open Heart Surgery;  Laterality: N/A; . Placement of centrimag ventricular assist device N/A 02/01/2015   Procedure: PLACEMENT OF CENTRIMAG VENTRICULAR ASSIST DEVICE;  Surgeon: Hanne Kegg Van Trigt, MD;  Location: [facility] OR;  Service: Open Heart Surgery;  Laterality: N/A; . Removal of centrimag ventricular assist device N/A 02/05/2015   Procedure: REMOVAL OF CENTRIMAG VENTRICULAR ASSIST DEVICE;  Surgeon: Modell Fendrick Van Trigt, MD;  Location: [facility] OR;  Service: Open Heart Surgery;  Laterality: N/A; . Cannulation for cardiopulmonary bypass N/A 02/05/2015   Procedure: CANNULATION FOR CARDIOPULMONARY BYPASS;  Surgeon: Ermin Parisien Van Trigt, MD;  Location: [facility] OR;  Service: Open Heart Surgery;  Laterality: N/A; . Tee without cardioversion N/A 02/05/2015   Procedure: TRANSESOPHAGEAL ECHOCARDIOGRAM (TEE);  Surgeon: Karianne Nogueira Van Trigt, MD;  Location: [facility];  Service: Open Heart Surgery;  Laterality: N/A; . Sternal closure N/A 02/08/2015   Procedure: STERNAL CLOSURE;  Surgeon: Shacola Schussler Van Trigt, MD;  Location: [facility] OR;  Service: Thoracic;  Laterality: N/A; . Tee without cardioversion N/A 02/08/2015   Procedure: TRANSESOPHAGEAL ECHOCARDIOGRAM (TEE);  Surgeon: Alrick Cubbage Van Trigt, MD;  Location: [facility] OR;  Service: Thoracic;  Laterality: N/A; HPI: Other Pertinent Information: 64 yo female never smoker with hx HTN, CAD previously refused CABG ultimately admitted 9/20 for CVTS eval and CABG after ongoing chest pain. 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 She remained intubated since initial CABG 9/26 with failure to wean, she was trached (42Tania ALa42mTania ALa80mTania ALa14mTania ALa24mTania ALa58mTania ALa89mTania ALa63mTania ALa59mTania ALa14mTania ALa16mTania ALa45mTania ALa43mTania ALa49mTania ALajArville Care 02/15/15, trach collar trials started. She had a possible aspiration event from tube feeds on 10/13, PEG placed 10/19.  No Data Recorded Assessment / Plan / Recommendation CHL IP CLINICAL IMPRESSIONS 03/10/2015 Therapy Diagnosis Mild oral phase dysphagia;Mild pharyngeal phase dysphagia Clinical Impression Pt presents with mild oral and oropharyngeal dysphagia characterized by generalized weakness resulting in premature spillage, delayed swallow initiation, and oral residue pooling in the valleculae across all consistencies. Pt sensed aspiration of thin liquids as evidenced by immediate cough, did not sense penetration or attempt to clear without verbal cue to clear throat/cough. Pt demonstrates prolonged oral phase; held bolus anteriorly, due to base of tongue weakness some of the bolus spills into valleculae. Pt demonstrated prolonged  mastication of solids and premature spillage, recommend restrictive diet until weakness improves. Pt cleared pill with puree and had a timely esophageal transit when observed with esophageal sweep (radiologist was not present to comment). SLP provided moderate cuing to swallow twice to succesfully clear oral and valleculae residue as that is the leading cause of penetration/aspiration. SLP recommends pt start in dysphagia 1 (puree), nectar thick liquids diet, pills whole in puree when pt is alert, sitting upright (90 degrees), and following cues for second swallow. Pt must wear PMSV with all PO intake.    CHL IP TREATMENT RECOMMENDATION 03/10/2015 Treatment Recommendations Therapy as outlined in treatment plan below   CHL IP DIET RECOMMENDATION 03/10/2015 SLP Diet Recommendations Dysphagia 1 (Puree);Nectar Liquid Administration via (None) Medication Administration Whole meds with puree  Compensations Slow rate;Small sips/bites;Multiple dry swallows after each bite/sip;Minimize environmental distractions Postural Changes and/or Swallow Maneuvers (None)   CHL IP OTHER RECOMMENDATIONS 03/10/2015 Recommended Consults (None) Oral Care Recommendations Oral care QID Other Recommendations Order thickener from pharmacy;Have oral suction available;Place PMSV during PO intake   CHL IP FOLLOW UP RECOMMENDATIONS 03/09/2015 Follow up Recommendations Skilled Nursing facility   [facility] IP FREQUENCY AND DURATION 03/10/2015 Speech Therapy Frequency (ACUTE ONLY) min 3x week Treatment Duration 2 weeks   Pertinent Vitals/Pain NA  SLP Swallow Goals No flowsheet data found. No flowsheet data found.   CHL IP REASON FOR REFERRAL 03/10/2015 Reason for Referral Objectively evaluate swallowing function   CHL IP ORAL PHASE 03/10/2015 Lips (None) Tongue (None) Mucous membranes (None) Nutritional status (None) Other (None) Oxygen therapy (None) Oral Phase Impaired Oral - Pudding Teaspoon (None) Oral - Pudding Cup (None) Oral - Honey Teaspoon (None) Oral - Honey Cup (None) Oral - Honey Syringe (None) Oral - Nectar Teaspoon (None) Oral - Nectar Cup (None) Oral - Nectar Straw (None) Oral - Nectar Syringe (None) Oral - Ice Chips (None) Oral - Thin Teaspoon (None) Oral - Thin Cup (None) Oral - Thin Straw (None) Oral - Thin Syringe (None) Oral - Puree (None) Oral - Mechanical Soft (None) Oral - Regular (None) Oral - Multi-consistency (None) Oral - Pill (None) Oral Phase - Comment (None)   CHL IP PHARYNGEAL PHASE 03/10/2015 Pharyngeal Phase Impaired Pharyngeal - Pudding Teaspoon (None) Penetration/Aspiration details (pudding teaspoon) (None) Pharyngeal - Pudding Cup (None) Penetration/Aspiration details (pudding cup) (None) Pharyngeal - Honey Teaspoon (None) Penetration/Aspiration details (honey teaspoon) (None) Pharyngeal - Honey Cup (None) Penetration/Aspiration details (honey cup) (None) Pharyngeal - Honey Syringe (None)  Penetration/Aspiration details (honey syringe) (None) Pharyngeal - Nectar Teaspoon (None) Penetration/Aspiration details (nectar teaspoon) (None) Pharyngeal - Nectar Cup (None) Penetration/Aspiration details (nectar cup) (None) Pharyngeal - Nectar Straw (None) Penetration/Aspiration details (nectar straw) (None) Pharyngeal - Nectar Syringe (None) Penetration/Aspiration details (nectar syringe) (None) Pharyngeal - Ice Chips (None) Penetration/Aspiration details (ice chips) (None) Pharyngeal - Thin Teaspoon (None) Penetration/Aspiration details (thin teaspoon) (None) Pharyngeal - Thin Cup (None) Penetration/Aspiration details (thin cup) (None) Pharyngeal - Thin Straw (None) Penetration/Aspiration details (thin straw) (None) Pharyngeal - Thin Syringe (None) Penetration/Aspiration details (thin syringe') (None) Pharyngeal - Puree (None) Penetration/Aspiration details (puree) (None) Pharyngeal - Mechanical Soft (None) Penetration/Aspiration details (mechanical soft) (None) Pharyngeal - Regular (None) Penetration/Aspiration details (regular) (None) Pharyngeal - Multi-consistency (None) Penetration/Aspiration details (multi-consistency) (None) Pharyngeal - Pill (None) Penetration/Aspiration details (pill) (None) Pharyngeal Comment (None)   CHL IP CERVICAL ESOPHAGEAL PHASE 03/10/2015 Cervical Esophageal Phase WFL Pudding Teaspoon (None) Pudding Cup (None) Honey Teaspoon (None) Honey Cup (None) Honey Straw (None) Nectar Teaspoon (None) Nectar Cup (None) Nectar Straw (None)  Nectar Sippy Cup (None) Thin Teaspoon (None) Thin Cup (None) Thin Straw (None) Thin Sippy Cup (None) Cervical Esophageal Comment (None) No flowsheet data found. Completed by Riccardo Dubin, SLP Student Supervised and reviewed by Harlon Ditty MA CCC-SLP   DeBlois, Riley Nearing 03/10/2015, 1:00 PM     Assessment/Plan: S/P Procedure(s) (LRB): STERNAL CLOSURE (N/A) TRANSESOPHAGEAL ECHOCARDIOGRAM (TEE) (N/A)  1. CV- remains hemodynamically  stable-continue digoxin, hydralazine, lopressor, NTG 2. Pulm- tolerating trach,continue IS 3. ID- continue Fortaz, Day 10/10 4. GU- UA is negative, urine output remains low, culture pending 5. GI- Barium swallow yesterday- diet ordered per recs, continue tube feeds for now 6. Deconditioning- needs SNF 7. Dispo- patient remains stable, ambulating with assistance, UA negative, culture pending.... Elita Quick completes today     Lowella Dandy 03/11/2015 8:42 AM  Advance  Dysphagia diet per SLT Advance ambulation- mobility Remove every other skin staple  for transfer to 3 S Currently on room air  patient examined and medical record reviewed,agree with above note. Kathlee Nations Trigt III 03/11/2015

## 2015-03-11 NOTE — Progress Notes (Signed)
Patient ID: Breanna QuarryMaria T Alvarez, female   DOB: 07-14-1950, 64 y.o.   MRN: [id number] EVENING ROUNDS NOTE :     [address]       [place] [id number]             [phone]                 31 Days Post-Op Procedure(s) (LRB): STERNAL CLOSURE (N/A) TRANSESOPHAGEAL ECHOCARDIOGRAM (TEE) (N/A)  Total Length of Stay:  LOS: 44 days  BP 113/58 mmHg  Pulse 102  Temp(Src) 98.6 F (37 C) (Oral)  Resp 24  Ht 5' (1.524 m)  Wt 98 lb 15.8 oz (44.9 kg)  BMI 19.33 kg/m2  SpO2 99%  .Intake/Output      11/03 0701 - 11/04 0700   I.V. (mL/kg) 120 (2.7)   Other 210   NG/GT 600   IV Piggyback 50   Total Intake(mL/kg) 980 (21.8)   Urine (mL/kg/hr) 600 (1)   Stool 0 (0)   Total Output 600   Net +380       Urine Occurrence 1 x   Stool Occurrence 2 x     . sodium chloride 10 mL/hr at 03/11/15 1900  . feeding supplement (JEVITY 1.2 CAL) 1,000 mL (03/11/15 1900)     Lab Results  Component Value Date   WBC 8.3 03/09/2015   HGB 9.7* 03/09/2015   HCT 32.0* 03/09/2015   PLT 338 03/09/2015   GLUCOSE 127* 03/10/2015   CHOL 223* 01/27/2015   TRIG 171* 02/22/2015   HDL 25* 01/27/2015   LDLCALC 179* 01/27/2015   ALT 34 03/08/2015   AST 22 03/08/2015   NA 147* 03/10/2015   K 4.0 03/10/2015   CL 109 03/10/2015   CREATININE 0.96 03/10/2015   BUN 50* 03/10/2015   CO2 30 03/10/2015   TSH 3.572 07/14/2014   INR 1.31 03/01/2015   HGBA1C 6.3* 01/30/2015   Waiting for step down bed  Delight OvensEdward B Keyarra Rendall MD  Beeper [phone] Office [phone] 03/11/2015 7:47 PM

## 2015-03-11 NOTE — Progress Notes (Signed)
Patient was able to take another trip on the portable monitor outside with RN today.  Patient tolerated the trip well and seemed to enjoy it.  RN stayed with patient the entire time until the patient was brought back and settled in her room.  Will continue to monitor carefully.

## 2015-03-11 NOTE — Progress Notes (Signed)
Patient seen for trach team follow up.  No education needed at this time.  All needed equipment at the bedside.  Will continue to follow. 

## 2015-03-11 NOTE — Progress Notes (Signed)
After the barium swallow evaluation, RN took patient and portable monitor outside.  Patient was excited about getting outside and exclaimed "yay! Breanna Alvarez! Yay!" on the way out.  Patient tolerated the trip well and after a few minutes was brought back to her room.  Will continue to monitor patient carefully.

## 2015-03-11 NOTE — Trach Care Team (Signed)
Trach Care Progression Note   Patient Details Name: Salli QuarryMaria T Redondo MRN: [id number] DOB: 1950-08-18 Today's Date: 03/11/2015   Tracheostomy Assessment    Tracheostomy Shiley 6 mm Cuffed (Active)  Status Secured 03/11/2015  1:26 PM  Site Assessment Clean;Dry 03/11/2015  1:26 PM  Site Care Cleansed;Dried 03/11/2015  2:21 AM  Inner Cannula Care Changed/new 03/11/2015  2:21 AM  Ties Assessment Clean;Dry;Secure 03/11/2015  1:26 PM  Cuff pressure (cm) 26 cm 03/11/2015  4:13 AM  Trach Changed Yes 03/05/2015 11:27 AM  Emergency Equipment at bedside Yes 03/11/2015  1:26 PM     Care Needs     Respiratory Therapy O2 Device: Tracheostomy Collar FiO2 (%): 28 % SpO2: 100 % Education: Ongoing Follow up recommendations:  (will follow for progression)    Speech Language Pathology  SLP chart review complete: Patient not ready for SLP services (lots of secretions per RN. Not ready) Patient may use Passy-Muir Speech Valve: During all waking hours (remove during sleep), During all therapies with supervision PMSV Supervision: Intermittent MD: Please consider changing trach tube to : Cuffless Follow up Recommendations: Skilled Nursing facility   Physical Therapy Ambulation/Gait assistance: Min assist, +2 physical assistance PT Recommendation/Assessment: Patient needs continued PT services Follow Up Recommendations: SNF PT equipment: None recommended by PT    Occupational Therapy OT Recommendation/Assessment: Patient needs continued OT Services Follow Up Recommendations: SNF OT Equipment: None recommended by OT    Nutritional Patient's Current Diet: Thickened liquids, Pureed, Tube feeding Tube Feeding: Jevity 1.2 Cal Tube Feeding Frequency: Continuous Tube Feeding Strength: Full strength SLP Diet Recommendations: Dysphagia 1 (Puree), Nectar    Case Management/Social Work Level of patient care prior to hospitalization: Home-Self care Living status: Spouse Insurance payer:  Medicaid Anticipated discharge disposition: SNF/Assisted  living facility (vs home if pt improves enough to be safe at home)    Theatre managerrovider Trach Care Team/Provider                           Recommendations Trach Care Team Members Present-  Lysbeth PennerMary Smith, RT, Shon BatonJenna Holloman, SW,  Harlon DittyBonnie DeBlois, SLP     Continue to monitor. On Trach collar. Ambulating in hall.          Christan Defranco, Silva BandyDebra Anita 03/11/2015, 2:11 PM  Scribe for team

## 2015-03-12 LAB — CBC
HCT: 32.1 % — ABNORMAL LOW (ref 36.0–46.0)
Hemoglobin: 9.6 g/dL — ABNORMAL LOW (ref 12.0–15.0)
MCH: 28.1 pg (ref 26.0–34.0)
MCHC: 29.9 g/dL — AB (ref 30.0–36.0)
MCV: 93.9 fL (ref 78.0–100.0)
Platelets: 258 10*3/uL (ref 150–400)
RBC: 3.42 MIL/uL — AB (ref 3.87–5.11)
RDW: 18.2 % — AB (ref 11.5–15.5)
WBC: 9.1 10*3/uL (ref 4.0–10.5)

## 2015-03-12 LAB — GLUCOSE, CAPILLARY
Glucose-Capillary: 160 mg/dL — ABNORMAL HIGH (ref 65–99)
Glucose-Capillary: 71 mg/dL (ref 65–99)
Glucose-Capillary: 185 mg/dL — ABNORMAL HIGH (ref 65–99)
Glucose-Capillary: 65 mg/dL (ref 65–99)
Glucose-Capillary: 84 mg/dL (ref 65–99)
Glucose-Capillary: 144 mg/dL — ABNORMAL HIGH (ref 65–99)

## 2015-03-12 LAB — RENAL FUNCTION PANEL
ALBUMIN: 2.4 g/dL — AB (ref 3.5–5.0)
Anion gap: 9 (ref 5–15)
BUN: 40 mg/dL — AB (ref 6–20)
CO2: 28 mmol/L (ref 22–32)
CREATININE: 0.99 mg/dL (ref 0.44–1.00)
Calcium: 9.2 mg/dL (ref 8.9–10.3)
Chloride: 113 mmol/L — ABNORMAL HIGH (ref 101–111)
GFR, EST NON AFRICAN AMERICAN: 60 mL/min — AB (ref >60)
Glucose, Bld: 189 mg/dL — ABNORMAL HIGH (ref 65–99)
PHOSPHORUS: 5 mg/dL — AB (ref 2.5–4.6)
POTASSIUM: 4.2 mmol/L (ref 3.5–5.1)
Sodium: 150 mmol/L — ABNORMAL HIGH (ref 135–145)

## 2015-03-12 LAB — MRSA PCR SCREENING: MRSA by PCR: NEGATIVE

## 2015-03-12 NOTE — Clinical Social Work Note (Signed)
CSW spoke with patient's husband, Breanna Alvarez, and provided bed offers.  At this time, patient has only one bed offer.  Barrier to other bed offers: Trach care and Payer source is Medicaid only.  Patient's husband continues to be agreeable to SNF placement as he now feels he cannot manage the patient's care at home.  [facility]  Breanna Alvarez is agreeable to CSW providing Breanna Alvarez his contact information for completion of admission's paperwork.  [facility]  Gina Taivon Haroon, LCSW [phone]  [facility] Licensed Clinical Social Worker

## 2015-03-12 NOTE — Discharge Summary (Signed)
Physician Discharge Summary  Patient ID: Breanna Alvarez MRN: [id number] DOB/AGE: 1950-05-28 64 y.o.  Admit date: 01/26/2015 Discharge date: 03/15/2015  Admission Diagnoses:  Patient Active Problem List   Diagnosis Date Noted  . ARDS (adult respiratory distress syndrome) (HCC)   . Aspiration into airway   . Acute on chronic systolic heart failure (HCC)   . Malnutrition of moderate degree (HCC) 02/17/2015  . Acute and chronic respiratory failure (acute-on-chronic) (HCC) 02/15/2015  . Failure to wean from mechanical ventilation (HCC) 02/15/2015  . Chest tube in place   . Acute MI, subendocardial (HCC)   . Atelectasis   . Congestive heart failure (CHF) (HCC)   . Acute respiratory failure (HCC)   . CAD (coronary artery disease) 02/05/2015  . 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 Coronary artery disease involving native coronary artery of native heart without angina pectoris   . Unstable angina (HCC) 01/04/2015  . Cardiomyopathy, ischemic 01/04/2015  . Essential hypertension 10/01/2014  . CAD, multiple vessel 07/14/2014  . Abnormal PFTs 07/14/2014  . Moderate mitral regurgitation 07/14/2014  . Hyperlipidemia LDL goal <70 07/14/2014  . Leukocytosis 07/14/2014  . Normocytic anemia 07/14/2014  . Chronic systolic heart failure (HCC)   . NSTEMI (non-ST elevated myocardial infarction) [facility](HCC)    Discharge Diagnoses:   Patient Active Problem List   Diagnosis Date Noted  . ARDS (adult respiratory distress syndrome) (HCC)   . Aspiration into airway   . Acute on chronic systolic heart failure (HCC)   . Malnutrition of moderate degree (HCC) 02/17/2015  . Acute and chronic respiratory failure (acute-on-chronic) (HCC) 02/15/2015  . Failure to wean from mechanical ventilation (HCC) 02/15/2015  . Chest tube in place   . Acute MI, subendocardial (HCC)   . Atelectasis   . Congestive heart failure (CHF) (HCC)   . Acute respiratory failure (HCC)   . CAD (coronary artery disease) 02/05/2015  . S/P CABG  x 4 02/01/2015  . Coronary artery disease involving native coronary artery of native heart without angina pectoris   . Unstable angina (HCC) 01/04/2015  . Cardiomyopathy, ischemic 01/04/2015  . Essential hypertension 10/01/2014  . CAD, multiple vessel 07/14/2014  . Abnormal PFTs 07/14/2014  . Moderate mitral regurgitation 07/14/2014  . Hyperlipidemia LDL goal <70 07/14/2014  . Leukocytosis 07/14/2014  . Normocytic anemia 07/14/2014  . Chronic systolic heart failure (HCC)   . NSTEMI (non-ST elevated myocardial infarction) [facility](HCC)    Discharged Condition: good  History of Present Illness:  Breanna Alvarez is a 64 yo female who presented with a NSTEMI in early 2016.  At that time it was recommended she undergo CABG procedure which was declined.  The patient wished to try diet adjustments.  She was evaluated by the Cardiologist in follow up on 01/22/2015.  At that time the patient admitted to using NTG almost daily.  She stated her chest pain was severe.  She had not been taking her medications as prescribed.  Despite being instructed to report to the ED the patient refused.  She was directly admitted from the office on 01/26/2015.  Due to constant chest pain patient was now agreeable to bypass surgery and TCTS consult was obtained.   Hospital Course:   Dr. Donata ClayVan Trigt evaluated the patient and was able to perform bypass surgery during this admission.  She was taken to the operating room on 02/01/2015.  She underwent CABG x 4 utilizing LIMA to LAD, SVG to Diagonal, SVG to OM, and SVG to PDA.  She was  difficult to wean from bypass and required placement of a Centrimag Left Ventricular assist device.  She also underwent endoscopic saphenous vein harvest form right and left thighs.  She tolerated the procedure and was taken to the SICU with an open chest.  Her stay in the SICU was long and complicated.  She was placed on prophylactic antibiotics for the centrimag.  She was transfused with multiple blood  products.  She was weaned off all drips and Centrimag as tolerated.  Her Centrimag was able to be removed on 02/05/2015.  She was closely managed by the [facility] team and her medications were optimized as she allowed.  She was started on TNA for nutritional support.  She developed an Illeus which was closely monitored.  She was aggressively diuresed due to hypervolemia.  She remained stable and was taken back to the operating room on 02/08/2015 for closure of her sternum.  The patient was difficult to wean from vent.  Critical care consult was obtained to assist with this.  This was complicated by agitation and developed of pneumonia.  She was treated with broad spectrum antibiotics for this. She passed weaning attempts on 02/12/2015, however within several hours she had to re-intubated.  This ultimately led to Tracheostomy which was performed on 02/15/2015.  Patient again developed respiratory issues with decreased oxygen levels.  This was felt to be due to tracheal aspiration and she was placed back on vent.  Tracheal aspirate was sent and broad spectrum antibiotics were ordered.  She improved slowly from this.  She is tolerating the trach and uses PM valve without difficulty.  She underwent PEG tube placement due to swallowing dysfunction.  She continued trach trials and has been successfully weaned from the vent.  Repeat swallow study has been done and the patient is currently tolerating a dysphagia 1 diet with nectar thickened liquids.  She can have medications crushed in applesauce if necessary. She will remain on tube feeds until her appetite improves.  She remains hemodynamically stable off all drips.  She is currently on Lopressor, Digoxin, and Hydralazine.  Her weight is below baseline.  She has participated with PT and is ambulating with assistance.  It was felt SNF placement would be indicated.  She is felt to be medically stable at this time.  She will be discharged once SNF bed available.               Consults: cardiology, pulmonary/intensive care and rehabilitation medicine  Treatments: surgery:   1. Coronary artery bypass grafting x4 (left internal mammary artery  left anterior descending, saphenous vein graft to diagonal,  saphenous vein graft to obtuse marginal, saphenous vein graft to  right coronary artery). 2. Endoscopic harvest of bilateral greater saphenous vein. 3. Placement of percutaneous temporary CentriMag left ventricular  assist device.  . Sternal excisional debridement and irrigation. 2. Sternal closure  Disposition: SNF  Discharge Medications:  The patient has been discharged on:   1.Beta Blocker:  Yes [x   ]                              No   [   ]                              If No, reason:  2.Ace Inhibitor/ARB: Yes [   ]  No  [  x  ]                                     If No, reason: labile BP  3.Statin:   Yes [  x ]                  No  [  ]                  If No, reason:   4.Ecasa:  Yes  [ x  ]                  No   [   ]                  If No, reason:     Medication List    STOP taking these medications        aspirin 81 MG chewable tablet  Replaced by:  aspirin 325 MG EC tablet     isosorbide mononitrate 30 MG 24 hr tablet  Commonly known as:  IMDUR     lisinopril 2.5 MG tablet  Commonly known as:  PRINIVIL,ZESTRIL     nitroGLYCERIN 0.4 MG SL tablet  Commonly known as:  NITROSTAT      TAKE these medications        acetaminophen 160 MG/5ML solution  Commonly known as:  TYLENOL  Take 20.3 mLs (650 mg total) by mouth every 6 (six) hours as needed for mild pain or fever.     albuterol (2.5 MG/3ML) 0.083% nebulizer solution  Commonly known as:  PROVENTIL  Take 3 mLs (2.5 mg total) by nebulization every 4 (four) hours as needed for wheezing or shortness of breath.     aspirin 325 MG EC tablet  Take 1 tablet (325 mg total) by mouth daily.     chlorhexidine gluconate 0.12 %  solution  Commonly known as:  PERIDEX  15 mLs by Mouth Rinse route 2 (two) times daily.     clonazePAM 0.5 MG tablet  Commonly known as:  KLONOPIN  Take 1 tablet (0.5 mg total) by mouth daily.     clonazePAM 1 MG tablet  Commonly known as:  KLONOPIN  Take 1 tablet (1 mg total) by mouth at bedtime.     Digoxin 62.5 MCG Tabs  Take 0.0625 mg by mouth daily.     enoxaparin 30 MG/0.3ML injection  Commonly known as:  LOVENOX  Inject 0.3 mLs (30 mg total) into the skin daily.     feeding supplement (JEVITY 1.2 CAL) Liqd  Place 1,000 mLs into feeding tube continuous. Currently at 22ml/hr     furosemide 20 MG tablet  Commonly known as:  LASIX  Take 1 tablet (20 mg total) by mouth daily.     Gerhardt's butt cream Crea  Apply 1 application topically 4 (four) times daily as needed for irritation.     hydrALAZINE 10 MG tablet  Commonly known as:  APRESOLINE  Take 1 tablet (10 mg total) by mouth every 8 (eight) hours.     lactobacillus acidophilus Tabs tablet  Take 2 tablets by mouth 2 (two) times daily.     magic mouthwash Soln  Take 10 mLs by mouth 3 (three) times daily as needed for mouth pain.     metoprolol tartrate 25 MG tablet  Commonly known as:  LOPRESSOR  Take  1 tablet (25 mg total) by mouth 2 (two) times daily.     ondansetron 4 MG/2ML Soln injection  Commonly known as:  ZOFRAN  Inject 2 mLs (4 mg total) into the vein every 6 (six) hours as needed for nausea or vomiting.     pantoprazole sodium 40 mg/20 mL Pack  Commonly known as:  PROTONIX  Place 20 mLs (40 mg total) into feeding tube daily.     pravastatin 80 MG tablet  Commonly known as:  PRAVACHOL  Take 1 tablet (80 mg total) by mouth every evening.     QUEtiapine 100 MG tablet  Commonly known as:  SEROQUEL  Take 1 tablet (100 mg total) by mouth at bedtime.     RESOURCE THICKENUP CLEAR Powd  All liquids should be nectar thick     scopolamine 1 MG/3DAYS  Commonly known as:  TRANSDERM-SCOP  Place 1 patch  (1.5 mg total) onto the skin every 3 (three) days.       Follow-up Information    Follow up with Kerin Perna III, MD On 04/14/2015.   Specialty:  Cardiothoracic Surgery   Why:  Appointment is at 11:30, please get CXR at 11:00 prior to appointment with Dr. Donata Clay on 1st floor of [facility]   Contact information:   [address] [place] [address] [place] [address] [phone]       Follow up with Sandrea Hughs, MD On 03/30/2015.   Specialty:  Pulmonary Disease   Why:  Appointment is at 2:15   Contact information:   [address] [place] [address] [phone]       Signed: Lowella Dandy 03/15/2015, 9:54 AM

## 2015-03-12 NOTE — Evaluation (Signed)
Speech Language Pathology Evaluation Patient Details Name: Breanna Alvarez MRN: [id number] DOB: Dec 22, 1950 Today's Date: 03/12/2015 Time: 1200-1228 SLP Time Calculation (min) (ACUTE ONLY): 28 min  Problem List:  Patient Active Problem List   Diagnosis Date Noted  . ARDS (adult respiratory distress syndrome) (HCC)   . Aspiration into airway   . Acute on chronic systolic heart failure (HCC)   . Malnutrition of moderate degree (HCC) 02/17/2015  . Acute and chronic respiratory failure (acute-on-chronic) (HCC) 02/15/2015  . Failure to wean from mechanical ventilation (HCC) 02/15/2015  . Chest tube in place   . Acute MI, subendocardial (HCC)   . Atelectasis   . Congestive heart failure (CHF) (HCC)   . Acute respiratory failure (HCC)   . CAD (coronary artery disease) 02/05/2015  . 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 Coronary artery disease involving native coronary artery of native heart without angina pectoris   . Unstable angina (HCC) 01/04/2015  . Cardiomyopathy, ischemic 01/04/2015  . Essential hypertension 10/01/2014  . CAD, multiple vessel 07/14/2014  . Abnormal PFTs 07/14/2014  . Moderate mitral regurgitation 07/14/2014  . Hyperlipidemia LDL goal <70 07/14/2014  . Leukocytosis 07/14/2014  . Normocytic anemia 07/14/2014  . Chronic systolic heart failure (HCC)   . NSTEMI (non-ST elevated myocardial infarction) [facility])    Past Medical History:  Past Medical History  Diagnosis Date  . Hyperlipidemia   . Acute systolic CHF (congestive heart failure), NYHA class 3 (HCC) 07/10/2014  . Hypertension    Past Surgical History:  Past Surgical History  Procedure Laterality Date  . No past surgeries    . Left heart catheterization with coronary angiogram N/A 07/13/2014    Procedure: LEFT HEART CATHETERIZATION WITH CORONARY ANGIOGRAM;  Surgeon: Corky Crafts, MD; mLAD 95%, dLAD 95%, D1 95%, CFX severe dz, mRCA 80%  . Coronary artery bypass graft N/A 02/01/2015    Procedure: CORONARY ARTERY  BYPASS GRAFTING (CABG) X 4 UTILIZING THE LEFT INTERNAL MAMMARY ARTERY TO LAD, ENDOSCOPICALLY HARVESTED BILATERAL SAPHENEOUS VEIN GRAFTS  TO DIAGONAL, OM AND PD.;  Surgeon: Kerin Perna, MD;  Location: [facility] OR;  Service: Open Heart Surgery;  Laterality: N/A;  . Tee without cardioversion N/A 02/01/2015    Procedure: TRANSESOPHAGEAL ECHOCARDIOGRAM (TEE);  Surgeon: Kerin Perna, MD;  Location: [facility] OR;  Service: Open Heart Surgery;  Laterality: N/A;  . Placement of centrimag ventricular assist device N/A 02/01/2015    Procedure: PLACEMENT OF CENTRIMAG VENTRICULAR ASSIST DEVICE;  Surgeon: Kerin Perna, MD;  Location: [facility] OR;  Service: Open Heart Surgery;  Laterality: N/A;  . Removal of centrimag ventricular assist device N/A 02/05/2015    Procedure: REMOVAL OF CENTRIMAG VENTRICULAR ASSIST DEVICE;  Surgeon: Kerin Perna, MD;  Location: [facility] OR;  Service: Open Heart Surgery;  Laterality: N/A;  . Cannulation for cardiopulmonary bypass N/A 02/05/2015    Procedure: CANNULATION FOR CARDIOPULMONARY BYPASS;  Surgeon: Kerin Perna, MD;  Location: [facility] OR;  Service: Open Heart Surgery;  Laterality: N/A;  . Tee without cardioversion N/A 02/05/2015    Procedure: TRANSESOPHAGEAL ECHOCARDIOGRAM (TEE);  Surgeon: Kerin Perna, MD;  Location: [facility] OR;  Service: Open Heart Surgery;  Laterality: N/A;  . Sternal closure N/A 02/08/2015    Procedure: STERNAL CLOSURE;  Surgeon: Kerin Perna, MD;  Location: [facility] OR;  Service: Thoracic;  Laterality: N/A;  . Tee without cardioversion N/A 02/08/2015    Procedure: TRANSESOPHAGEAL ECHOCARDIOGRAM (TEE);  Surgeon: Kerin Perna, MD;  Location: [facility] OR;  Service: Thoracic;  [place]uckyTrenton GammonGerrie NordmannJolyn Lent80w2dProvidence Alaska Medical CenterRee Edman Delbert Harness13Victorino [phone] GammonGerrie NordmannJolyn Lent60w2dSurgery Center Of South Central KansasRee Edman Delbert Harness90Victorino [phone] GammonGerrie NordmannJolyn Lent43w2dJewish Hospital ShelbyvilleRee Edman Delbert Harness45Victorino [phone] GammonGerrie NordmannJolyn Lent48w2dSt. Louis Psychiatric Rehabilitation CenterRee Edman Delbert Harness85Victorino [phone] GammonGerrie NordmannJolyn Lent55w2dSelect Specialty Hospital - Cleveland FairhillRee Edman Delbert Harness47Victorino [phone] GammonGerrie NordmannJolyn Lent4w2dPalos Community HospitalRee Edman Delbert Harness93Victorino [phone] Gammon

## 2015-03-12 NOTE — Progress Notes (Signed)
Speech Language Pathology Treatment: Dysphagia;Passy Muir Speaking valve  Patient Details Name: Breanna Alvarez MRN: [id number] DOB: 09-18-1950 Today's Date: 03/12/2015 Time: 1200-1228 SLP Time Calculation (min) (ACUTE ONLY): 28 min  Assessment / Plan / Recommendation Clinical Impression  Pt tolerating current diet without signs of aspiration, min verbal cues provided to encourage pt to follow through with second swallow. Pt also tolerating PMSV all waking hours, no air trapping, vital signs stable throughout session. Provided min verbal cues for increased volume at conversation level. Proceeded with cognitive linguistic evaluation to address orientation, attention and problem solving during treatment sessions. See next note.    HPI     Pertinent Vitals  N/A  SLP Plan  Continue with current plan of care    Recommendations Diet recommendations: Dysphagia 1 (puree);Nectar-thick liquid Liquids provided via: Cup Medication Administration: Whole meds with puree Supervision: Full supervision/cueing for compensatory strategies;Staff to assist with self feeding Compensations: Slow rate;Small sips/bites;Multiple dry swallows after each bite/sip;Minimize environmental distractions Postural Changes and/or Swallow Maneuvers: Seated upright 90 degrees      Patient may use Passy-Muir Speech Valve: During all waking hours (remove during sleep);During all therapies with supervision PMSV Supervision: Intermittent MD: Please consider changing trach tube to : Cuffless       Plan: Continue with current plan of care    GO    [facility] Center For Digestive Health LLCBonnie Stefanny Pieri, MA CCC-SLP [phone]  Claudine MoutonDeBlois, Gabe Glace Caroline 03/12/2015, 1:26 PM

## 2015-03-12 NOTE — Progress Notes (Signed)
TCTS DAILY ICU PROGRESS NOTE                   [address]            [place] [id number]          [phone]   32 Days Post-Op Procedure(s) (LRB): STERNAL CLOSURE (N/A) TRANSESOPHAGEAL ECHOCARDIOGRAM (TEE) (N/A)  Total Length of Stay:  LOS: 45 days   Subjective:  [name] complains of back discomfort this morning.  She is ambulating with assistance.  Awaiting a bed on stepdown unit.  Objective: Vital signs in last 24 hours: Temp:  [98.2 F (36.8 C)-98.7 F (37.1 C)] 98.4 F (36.9 C) (11/04 0404) Pulse Rate:  [85-115] 115 (11/04 0734) Cardiac Rhythm:  [-] Sinus tachycardia (11/04 0600) Resp:  [12-30] 24 (11/04 0734) BP: (91-150)/(40-97) 131/90 mmHg (11/04 0734) SpO2:  [95 %-100 %] 100 % (11/04 0734) FiO2 (%):  [28 %] 28 % (11/04 0734) Weight:  [98 lb 8.7 oz (44.7 kg)] 98 lb 8.7 oz (44.7 kg) (11/04 0600)  Filed Weights   03/10/15 0600 03/11/15 0600 03/12/15 0600  Weight: 81 lb 9.1 oz (37 kg) 98 lb 15.8 oz (44.9 kg) 98 lb 8.7 oz (44.7 kg)    Weight change: -7.1 oz (-0.2 kg)   Intake/Output from previous day: 11/03 0701 - 11/04 0700 In: 1720 [P.O.:30; I.V.:230; NG/GT:1150; IV Piggyback:100] Out: 1151 [Urine:1150; Stool:1]   Current Meds: Scheduled Meds: . albuterol  2.5 mg Nebulization Q6H  . antiseptic oral rinse  7 mL Mouth Rinse QID  . aspirin EC  325 mg Oral Daily   Or  . aspirin  324 mg Per Tube Daily  . cefTAZidime (FORTAZ)  IV  2 g Intravenous Q12H  . chlorhexidine gluconate  15 mL Mouth Rinse BID  . clonazePAM  1 mg Oral BID  . digoxin  0.0625 mg Oral Daily  . enoxaparin (LOVENOX) injection  30 mg Subcutaneous Q24H  . hydrALAZINE  10 mg Oral 3 times per day  . insulin aspart  0-24 Units Subcutaneous 6 times per day  . lactobacillus acidophilus  2 tablet Oral BID  . metoprolol tartrate  25 mg Oral BID  . nitroGLYCERIN  0.4 mg Transdermal Daily  . pantoprazole sodium  40 mg Per Tube Daily  . QUEtiapine  100 mg Oral QHS  .  scopolamine  1 patch Transdermal Q72H  . sodium chloride  10-40 mL Intracatheter Q12H   Continuous Infusions: . sodium chloride Stopped (03/12/15 0600)  . feeding supplement (JEVITY 1.2 CAL) 1,000 mL (03/11/15 2300)   PRN Meds:.acetaminophen (TYLENOL) oral liquid 160 mg/5 mL, Gerhardt's butt cream, magic mouthwash, ondansetron (ZOFRAN) IV, oxyCODONE, RESOURCE THICKENUP CLEAR, sodium chloride  General appearance: alert, cooperative and no distress Heart: regular rate and rhythm Lungs: clear to auscultation bilaterally Abdomen: soft, non-tender; bowel sounds normal; no masses,  no organomegaly Extremities: edema none Wound: clean and dry  Lab Results: CBC: Recent Labs  03/12/15 0431  WBC 9.1  HGB 9.6*  HCT 32.1*  PLT 258   BMET:  Recent Labs  03/10/15 0845 03/12/15 0430  NA 147* 150*  K 4.0 4.2  CL 109 113*  CO2 30 28  GLUCOSE 127* 189*  BUN 50* 40*  CREATININE 0.96 0.99  CALCIUM 9.7 9.2    PT/INR: No results for input(s): LABPROT, INR in the last 72 hours. Radiology: No results found.   Assessment/Plan: S/P Procedure(s) (LRB): STERNAL CLOSURE (N/A) TRANSESOPHAGEAL ECHOCARDIOGRAM (TEE) (N/A)  1. CV- remains hemodynamically stable, continue digoxin, hydralazine, lopressor, NTG 2. Pulm- tolerating trach, continue pulmonary care 3. Renal- creatinine remains stable 4. GU- urinary function improved, UA/UC negative 5. GI- tolerating diet, continue tube feeds for now 6. Deconditioning- needs SNF 7. Dispo- patient remains stable, ambulating, will consult CSW for SNF placement, awaiting transfer to 3S     Reha Martinovich 03/12/2015 8:03 AM

## 2015-03-12 NOTE — Progress Notes (Signed)
PULMONARY / CRITICAL CARE MEDICINE   Name: IVIS NICOLSON MRN: [id number] DOB: [birth year]    ADMISSION DATE:  01/26/2015 CONSULTATION DATE:  02/09/2105  REFERRING MD :  Donata Clay   CHIEF COMPLAINT:  Vent management   INITIAL PRESENTATION:  64 yo female admitted with chest pain from CAD.  She had CABG 02/01/15 >> post-op complicated by cardiogenic shock s/p centrimag placement and removal, ischemic cardiomyopathy with EF 20%, open sternum.  She remained on vent post-op and PCCM consulted to assist with vent weaning.  STUDIES:  9/21 Echo - EF 20-25%, mod MR, PA press 51 mmHg 10/15 Echo - EF 25 to 30%, PAS 36 mmHg 10/31 Portable CXR - no new focal opacity or effusion.  CULTURES: Resp Ctx (10/24):  Negative Blood Ctx (10/18):  Negative Resp Ctx (10/17):  Negative Urine Ctx (10/17):  Negative Blood Ctx (10/13):  Negative Resp Ctx (10/12):  Negative Resp Ctx (10/4):  Negative  ANTIBIOTICS: Elita Quick 10/26>>> Zosyn 10/12 - 10/21.   SIGNIFICANT EVENTS: 09/26 CABG x 4, placement of centrimag L ventricular assistive device  09/30 removal centrimag  10/03 sternal closure 10/09 Hg drop overnight by 1 gm and transfusion ordered 10/10 Trach by DF 10/12 Off precedex 10/13 possible aspiration event; TNA started 10/14 difficulty with agitation 10/16 off pressors 10/17 off TNA 10/18 Fever 101, transfuse 1 unit PRBC 10/20 start trach collar trials; off milrinone; G tube placed 02/28/15 Tolerating trach collar at daytime. On vent at night. 03/01/15 atc x daytime. Sitting in chair. RN who had her before weekend reports no issues. More alert. Nods to simple Qs 03/03/15 Remains on TC 03/05/15 -  Now on PMV. Had to go back on vent briefly yesterday. Getting daily Nocturnal Vent per RN.   SUBJECTIVE: Sitting in chair eating .  VITAL SIGNS: Temp:  [97.6 F (36.4 C)-98.7 F (37.1 C)] 97.6 F (36.4 C) (11/04 0734) Pulse Rate:  [85-115] 115 (11/04 0734) Resp:  [12-30] 24 (11/04 0734) BP:  (91-150)/(40-97) 131/90 mmHg (11/04 0734) SpO2:  [95 %-100 %] 100 % (11/04 0734) FiO2 (%):  [28 %] 28 % (11/04 0734) Weight:  [98 lb 8.7 oz (44.7 kg)] 98 lb 8.7 oz (44.7 kg) (11/04 0600)   HEMODYNAMICS:     VENTILATOR SETTINGS: Vent Mode:  [-]  FiO2 (%):  [28 %] 28 %  INTAKE / OUTPUT:  Intake/Output Summary (Last 24 hours) at 03/12/15 0858 Last data filed at 03/12/15 0600  Gross per 24 hour  Intake   1630 ml  Output    751 ml  Net    879 ml   PHYSICAL EXAMINATION: General:  Awake. Sitting up in chair. Watching TV eating, cracking jokes. Sitter at bedside. Integument:  Warm & dry. No rash on exposed skin.  HEENT:  Moist mucus membranes. No scleral injection. Tracheostomy in place with PMV in place, speech clear Cardiovascular:  Regular rhythm and rate. No edema.  Pulmonary: Normal work of breathing off tracheostomy collar. Abdomen: Soft. Normal bowel sounds. PEG tube in place. Mild tenderness to palpation in epigastrium Musculoskeletal:  Normal bulk and tone. No joint deformity or effusion appreciated.  LABS:`PULMONARY No results for input(s): PHART, PCO2ART, PO2ART, HCO3, TCO2, O2SAT in the last 168 hours.  Invalid input(s): PCO2, PO2  CBC  Recent Labs Lab 03/08/15 0430 03/09/15 0350 03/12/15 0431  HGB 9.6* 9.7* 9.6*  HCT 31.6* 32.0* 32.1*  WBC 8.1 8.3 9.1  PLT 347 338 258     CHEMISTRY  Recent Labs Lab 03/07/15 0406  03/08/15 0430 03/09/15 0350 03/10/15 0845 03/12/15 0430  NA 146* 148* 149* 147* 150*  K 3.2* 3.7 3.7 4.0 4.2  CL 105 109 110 109 113*  CO2 27 28 30 30 28   GLUCOSE 161* 201* 193* 127* 189*  BUN 56* 63* 62* 50* 40*  CREATININE 1.10* 1.06* 1.16* 0.96 0.99  CALCIUM 9.6 9.3 9.4 9.7 9.2  MG  --  2.2  --   --   --   PHOS  --   --   --  4.4 5.0*   Estimated Creatinine Clearance: 41.6 mL/min (by C-G formula based on Cr of 0.99).  LIVER  Recent Labs Lab 03/08/15 0430 03/10/15 0845 03/12/15 0430  AST 22  --   --   ALT 34  --   --    ALKPHOS 103  --   --   BILITOT 1.3*  --   --   PROT 7.6  --   --   ALBUMIN 2.5* 2.5* 2.4*   ENDOCRINE CBG (last 3)   Recent Labs  03/11/15 2337 03/12/15 0402 03/12/15 0830  GLUCAP 72 160* 71    Iron/TIBC/Ferritin/ %Sat    Component Value Date/Time   IRON 19* 02/22/2015 0900   TIBC 143* 02/22/2015 0900   FERRITIN 1025* 02/22/2015 0900   IRONPCTSAT 13 02/22/2015 0900    IMAGING x48h Dg Swallowing Func-speech Pathology  03/10/2015  Objective Swallowing Evaluation:   Patient Details Name: ATONYA TEMPLER MRN: [id number] Date of Birth: [birth year] Today's Date: 03/10/2015 Time: SLP Start Time (ACUTE ONLY): 1100-SLP Stop Time (ACUTE ONLY): 1130 SLP Time Calculation (min) (ACUTE ONLY): 30 min Past Medical History: Past Medical History Diagnosis Date . Hyperlipidemia  . Acute systolic CHF (congestive heart failure), NYHA class 3 (HCC) 07/10/2014 . Hypertension  Past Surgical History: Past Surgical History Procedure Laterality Date . No past surgeries   . Left heart catheterization with coronary angiogram N/A 07/13/2014   Procedure: LEFT HEART CATHETERIZATION WITH CORONARY ANGIOGRAM;  Surgeon: Corky Crafts, MD; mLAD 95%, dLAD 95%, D1 95%, CFX severe dz, mRCA 80% . Coronary artery bypass graft N/A 02/01/2015   Procedure: CORONARY ARTERY BYPASS GRAFTING (CABG) X 4 UTILIZING THE LEFT INTERNAL MAMMARY ARTERY TO LAD, ENDOSCOPICALLY HARVESTED BILATERAL SAPHENEOUS VEIN GRAFTS  TO DIAGONAL, OM AND PD.;  Surgeon: Kerin Perna, MD;  Location: [facility] OR;  Service: Open Heart Surgery;  Laterality: N/A; . Tee without cardioversion N/A 02/01/2015   Procedure: TRANSESOPHAGEAL ECHOCARDIOGRAM (TEE);  Surgeon: Kerin Perna, MD;  Location: [facility] OR;  Service: Open Heart Surgery;  Laterality: N/A; . Placement of centrimag ventricular assist device N/A 02/01/2015   Procedure: PLACEMENT OF CENTRIMAG VENTRICULAR ASSIST DEVICE;  Surgeon: Kerin Perna, MD;  Location: [facility] OR;  Service: Open Heart Surgery;  Laterality:  N/A; . Removal of centrimag ventricular assist device N/A 02/05/2015   Procedure: REMOVAL OF CENTRIMAG VENTRICULAR ASSIST DEVICE;  Surgeon: Kerin Perna, MD;  Location: [facility] OR;  Service: Open Heart Surgery;  Laterality: N/A; . Cannulation for cardiopulmonary bypass N/A 02/05/2015   Procedure: CANNULATION FOR CARDIOPULMONARY BYPASS;  Surgeon: Kerin Perna, MD;  Location: [facility] OR;  Service: Open Heart Surgery;  Laterality: N/A; . Tee without cardioversion N/A 02/05/2015   Procedure: TRANSESOPHAGEAL ECHOCARDIOGRAM (TEE);  Surgeon: Kerin Perna, MD;  Location: [facility] OR;  Service: Open Heart Surgery;  Laterality: N/A; . Sternal closure N/A 02/08/2015   Procedure: STERNAL CLOSURE;  Surgeon: Kerin Perna, MD;  Location: [facility] OR;  Service: Thoracic;  Laterality:  N/A; Rhae Hammock without cardioversion N/A 02/08/2015   Procedure: TRANSESOPHAGEAL ECHOCARDIOGRAM (TEE);  Surgeon: Kerin Perna, MD;  Location: [facility] OR;  Service: Thoracic;  Laterality: N/A; HPI: Other Pertinent Information: 64 yo female never smoker with hx HTN, CAD previously refused CABG ultimately admitted 9/20 for CVTS eval and CABG after ongoing chest pain. On 02/01/15 she had a CORONARY ARTERY BYPASS GRAFTING (CABG), MITRAL VALVE REPAIR (MVR), and TRANSESOPHAGEAL ECHOCARDIOGRAM (TEE) and has had complicated course with cardiogenic shock s/p centrimag placement (temporary LVAD) and removal (on 9/30), ischemic cardiomyopathy with EF 20%, open sternum now s/p sternal closure 10/3. She remained intubated since initial CABG 9/26 with failure to wean, she was trached (6mm Shiley) on 02/15/15, trach collar trials started. She had a possible aspiration event from tube feeds on 10/13, PEG placed 10/19.  No Data Recorded Assessment / Plan / Recommendation CHL IP CLINICAL IMPRESSIONS 03/10/2015 Therapy Diagnosis Mild oral phase dysphagia;Mild pharyngeal phase dysphagia Clinical Impression Pt presents with mild oral and oropharyngeal dysphagia characterized by generalized  weakness resulting in premature spillage, delayed swallow initiation, and oral residue pooling in the valleculae across all consistencies. Pt sensed aspiration of thin liquids as evidenced by immediate cough, did not sense penetration or attempt to clear without verbal cue to clear throat/cough. Pt demonstrates prolonged oral phase; held bolus anteriorly, due to base of tongue weakness some of the bolus spills into valleculae. Pt demonstrated prolonged mastication of solids and premature spillage, recommend restrictive diet until weakness improves. Pt cleared pill with puree and had a timely esophageal transit when observed with esophageal sweep (radiologist was not present to comment). SLP provided moderate cuing to swallow twice to succesfully clear oral and valleculae residue as that is the leading cause of penetration/aspiration. SLP recommends pt start in dysphagia 1 (puree), nectar thick liquids diet, pills whole in puree when pt is alert, sitting upright (90 degrees), and following cues for second swallow. Pt must wear PMSV with all PO intake.    CHL IP TREATMENT RECOMMENDATION 03/10/2015 Treatment Recommendations Therapy as outlined in treatment plan below   CHL IP DIET RECOMMENDATION 03/10/2015 SLP Diet Recommendations Dysphagia 1 (Puree);Nectar Liquid Administration via (None) Medication Administration Whole meds with puree Compensations Slow rate;Small sips/bites;Multiple dry swallows after each bite/sip;Minimize environmental distractions Postural Changes and/or Swallow Maneuvers (None)   CHL IP OTHER RECOMMENDATIONS 03/10/2015 Recommended Consults (None) Oral Care Recommendations Oral care QID Other Recommendations Order thickener from pharmacy;Have oral suction available;Place PMSV during PO intake   CHL IP FOLLOW UP RECOMMENDATIONS 03/09/2015 Follow up Recommendations Skilled Nursing facility   [facility] IP FREQUENCY AND DURATION 03/10/2015 Speech Therapy Frequency (ACUTE ONLY) min 3x week Treatment Duration 2  weeks   Pertinent Vitals/Pain NA  SLP Swallow Goals No flowsheet data found. No flowsheet data found.   CHL IP REASON FOR REFERRAL 03/10/2015 Reason for Referral Objectively evaluate swallowing function   CHL IP ORAL PHASE 03/10/2015 Lips (None) Tongue (None) Mucous membranes (None) Nutritional status (None) Other (None) Oxygen therapy (None) Oral Phase Impaired Oral - Pudding Teaspoon (None) Oral - Pudding Cup (None) Oral - Honey Teaspoon (None) Oral - Honey Cup (None) Oral - Honey Syringe (None) Oral - Nectar Teaspoon (None) Oral - Nectar Cup (None) Oral - Nectar Straw (None) Oral - Nectar Syringe (None) Oral - Ice Chips (None) Oral - Thin Teaspoon (None) Oral - Thin Cup (None) Oral - Thin Straw (None) Oral - Thin Syringe (None) Oral - Puree (None) Oral - Mechanical Soft (None) Oral - Regular (None) Oral -  Multi-consistency (None) Oral - Pill (None) Oral Phase - Comment (None)   CHL IP PHARYNGEAL PHASE 03/10/2015 Pharyngeal Phase Impaired Pharyngeal - Pudding Teaspoon (None) Penetration/Aspiration details (pudding teaspoon) (None) Pharyngeal - Pudding Cup (None) Penetration/Aspiration details (pudding cup) (None) Pharyngeal - Honey Teaspoon (None) Penetration/Aspiration details (honey teaspoon) (None) Pharyngeal - Honey Cup (None) Penetration/Aspiration details (honey cup) (None) Pharyngeal - Honey Syringe (None) Penetration/Aspiration details (honey syringe) (None) Pharyngeal - Nectar Teaspoon (None) Penetration/Aspiration details (nectar teaspoon) (None) Pharyngeal - Nectar Cup (None) Penetration/Aspiration details (nectar cup) (None) Pharyngeal - Nectar Straw (None) Penetration/Aspiration details (nectar straw) (None) Pharyngeal - Nectar Syringe (None) Penetration/Aspiration details (nectar syringe) (None) Pharyngeal - Ice Chips (None) Penetration/Aspiration details (ice chips) (None) Pharyngeal - Thin Teaspoon (None) Penetration/Aspiration details (thin teaspoon) (None) Pharyngeal - Thin Cup (None)  Penetration/Aspiration details (thin cup) (None) Pharyngeal - Thin Straw (None) Penetration/Aspiration details (thin straw) (None) Pharyngeal - Thin Syringe (None) Penetration/Aspiration details (thin syringe') (None) Pharyngeal - Puree (None) Penetration/Aspiration details (puree) (None) Pharyngeal - Mechanical Soft (None) Penetration/Aspiration details (mechanical soft) (None) Pharyngeal - Regular (None) Penetration/Aspiration details (regular) (None) Pharyngeal - Multi-consistency (None) Penetration/Aspiration details (multi-consistency) (None) Pharyngeal - Pill (None) Penetration/Aspiration details (pill) (None) Pharyngeal Comment (None)   CHL IP CERVICAL ESOPHAGEAL PHASE 03/10/2015 Cervical Esophageal Phase WFL Pudding Teaspoon (None) Pudding Cup (None) Honey Teaspoon (None) Honey Cup (None) Honey Straw (None) Nectar Teaspoon (None) Nectar Cup (None) Nectar Straw (None) Nectar Sippy Cup (None) Thin Teaspoon (None) Thin Cup (None) Thin Straw (None) Thin Sippy Cup (None) Cervical Esophageal Comment (None) No flowsheet data found. Completed by Riccardo DubinKristen Martin, SLP Student Supervised and reviewed by Harlon DittyBonnie DeBlois MA CCC-SLP   DeBlois, Riley NearingBonnie Caroline 03/10/2015, 1:00 PM    ASSESSMENT / PLAN: 64 year old female status post CABG with acute hypoxic respiratory failure. Secretions are improving with scopolamine patch. Patient continuing on treatment for healthcare associated pneumonia without adverse effect. Hypernatremia improving off of Lasix. I suspect the decreased urine output is likely secondary to discontinuation of the catheter as well as discontinuing Lasix at this time. Patient planned to transition to a stepdown unit. Abdominal pain of unclear significance and possibly secondary to the patient being more awake and alert.  661. 64 year old female status post CABG with acute hypoxic respiratory failure. Secretions are improving with scopolamine patch. Patient continuing on treatment for healthcare associated  pneumonia. Tracheostomy placement on 10/10 by Dr. Tyson AliasFeinstein. Continuing on tracheostomy. Continuing scopolamine patch which seems to be improving patient's oral secretions. 2. Aspiration pneumonia/HCAP: Repeat culture from 10/24 negative. Continuing Fortaz empirically for coverage day #8/10. 3. Hypernatremia: Mild but improving. Continuing to hold Lasix. Repeat Renal Panel on 11/4. 4. Cardiogenic shock: Resolved. 5. Acute renal failure: Resolved. Continuing to trend urine output. Monitoring renal function with daily BUN/creatinine. 6. Hypokalemia: Resolved.  7. Coronary artery disease w/ ICM: Status post CABG. Currently on aspirin, Lopressor, nitroglycerin patch, & hydralazine. 8. Protein calorie malnutrition: Continuing tube feeds per dietary recommendations. 9. Anemia: Hemoglobin stable. No signs of active bleeding. Repeat CBC on 11/4. 10. Delirium: Multifactorial but improving. Continuing Klonopin twice a day & Seroquel. 11. Prophylaxis: Lovenox subcutaneous daily, SCDs, & Protonix via tube. 12. Disposition: Medical social worker arranging for transition to a local facility. PT/OT/SLP consulted. 13.  Awake and alert. NAD, sitting up eating. PCCM will see prn.  Brett CanalesSteve Elnoria Livingston ACNP [facility]ackLe [facility] PCCM Pager [phone] till 3 pm If no answer page [phone] 03/12/2015, 8:59 AM

## 2015-03-12 NOTE — Progress Notes (Signed)
Nutrition Follow Up  DOCUMENTATION CODES:   Non-severe (moderate) malnutrition in context of chronic illness, Underweight  INTERVENTION:   Continue Jevity 1.2 formula at goal rate of 50 ml/hr  TF regimen to provide 1440 kcals, 67 gm protein, 968 ml of free water  NUTRITION DIAGNOSIS:   Inadequate oral intake related to inability to eat as evidenced by NPO status, ongoing  GOAL:   Patient will meet greater than or equal to 90% of their needs, met  MONITOR:   TF tolerance, PO intake, Labs, Weight trends, I & O's  ASSESSMENT:   64 yo female never smoker with hx HTN, CAD previously refused CABG ultimately admitted 9/20 for CVTS eval and CABG after ongoing chest pain. She underwent 4V CABG 9/26 and has had complicated course with cardiogenic shock s/p centrimag placement and removal, ischemic cardiomyopathy with EF 20%, open sternum now s/p sternal closure 10/3. She remains intubated since initial CABG 9/26 with poor weaning and PCCM now consulted to assist.   Patient s/p procedure 9/26: CORONARY ARTERY BYPASS GRAFTING x 4 PLACEMENT OF CENTRIMAG VENTRICULAR ASSIST DEVICE  Patient s/p procedure 10/20: IR GASTROSTOMY TUBE MOD SED  Pt remains on trach collar.  Jevity 1.2 formula currently infusing at goal rate of 50 ml/hr via G-tube providing 1440 kcals, 67 gm protein, 968 ml of free water.  S/p objective swallow evaluation 11/2.  Diet advanced to Dys 1, nectar thick liquids.  PO intake is very limited at this time.  Per RN, stools are no longer "seedy".  Diet Order:  DIET - DYS 1 Room service appropriate?: Yes; Fluid consistency:: Nectar Thick  Skin:  sternal wound closed  Last BM:  11/4  Height:   Ht Readings from Last 1 Encounters:  03/04/15 5' (1.524 m)    Weight:   Wt Readings from Last 1 Encounters:  03/12/15 98 lb 8.7 oz (44.7 kg)    Ideal Body Weight:  45.4 kg  BMI:  Body mass index is 19.25 kg/(m^2).  Estimated Nutritional Needs:   Kcal:   1400-1600  Protein:  60-70 gm  Fluid:  per MD  EDUCATION NEEDS:   No education needs identified at this time  Arthur Holms, RD, LDN Pager #: [phone] After-Hours Pager #: [phone]

## 2015-03-12 NOTE — Progress Notes (Signed)
Pt transferred from 2S with RN and sitter on monitor. Pt alert and VSS. Will continue to monitor.

## 2015-03-12 NOTE — Progress Notes (Signed)
Physical Therapy Treatment Patient Details Name: Breanna Alvarez MRN: [id number] DOB: 1950-05-28 Today's Date: 03/12/2015    History of Present Illness Breanna BoucheMaria Alvarez is a 64 y.o. female who has PMHx of CAD, CHF and had an NSTEMI in ealy 2016, present to ED with ongoing CP.  She is now s/p CABG as of 9/27 with Centrimag placement, 9/30 Centrimag removal, 10/3 sternal closure, Wound I and D 10/4, failed extubation 10/7, 10/10 trached, ?aspiration on 10/13 and worsening status.     [facility]  Using PMV as of 10/28.    PT Comments    Progressing slowly.  Limited by pt's fatigue and need for redirection to task.  Emphasis on gait/stability, transfers/safety and activity tolerance.  Follow Up Recommendations  SNF     Equipment Recommendations  None recommended by PT;Other (comment) (TBA at a later time/next venue)    Recommendations for Other Services       Precautions / Restrictions Precautions Precautions: Fall;Sternal    Mobility  Bed Mobility               General bed mobility comments: already OOB in the recliner  Transfers Overall transfer level: Needs assistance Equipment used: Rolling walker (2 wheeled) Transfers: Sit to/from Stand Sit to Stand: Min assist;Mod assist         General transfer comment: assist due to sternal precautions  Ambulation/Gait Ambulation/Gait assistance: Mod assist;+2 safety/equipment Ambulation Distance (Feet): 90 Feet (x2) Assistive device: Rolling walker (2 wheeled) Gait Pattern/deviations: Step-through pattern;Scissoring;Ataxic   Gait velocity interpretation: Below normal speed for age/gender General Gait Details: generally unsteady with help to maneuver the RW, cues to redirect for loss of focus.  Quick to fatigue.   Stairs            Wheelchair Mobility    Modified Rankin (Stroke Patients Only)       Balance Overall balance assessment: Needs assistance   Sitting balance-Leahy Scale:  Good       Standing balance-Leahy Scale: Poor Standing balance comment: reliant on assist or AD                    Cognition Arousal/Alertness: Awake/alert Behavior During Therapy: Impulsive;Restless Overall Cognitive Status: Impaired/Different from baseline Area of Impairment: Attention;Following commands;Safety/judgement;Awareness;Problem solving Orientation Level: Situation;Time Current Attention Level: Sustained   Following Commands: Follows one step commands consistently;Follows multi-step commands inconsistently Safety/Judgement: Decreased awareness of safety Awareness: Intellectual Problem Solving: Slow processing;Requires verbal cues;Requires tactile cues      Exercises      General Comments General comments (skin integrity, edema, etc.): SpO2 on RA upper 90's to 100%,  EHR  100's bpm      Pertinent Vitals/Pain Pain Assessment: Faces Faces Pain Scale: No hurt Pain Location: abdomen during cough Pain Intervention(s): Monitored during session    Home Living                      Prior Function            PT Goals (current goals can now be found in the care plan section) Acute Rehab PT Goals Patient Stated Goal: not stated PT Goal Formulation: Patient unable to participate in goal setting Time For Goal Achievement: 03/12/15 Potential to Achieve Goals: Fair Progress towards PT goals: Progressing toward goals    Frequency  Min 3X/week    PT Plan Current plan remains appropriate    Co-evaluation  End of Session   Activity Tolerance: Patient tolerated treatment well Patient left: in chair;with call bell/phone within reach;with nursing/sitter in room;with family/visitor present     Time: 1450-1510 PT Time Calculation (min) (ACUTE ONLY): 20 min  Charges:  $Gait Training: 8-22 mins                    G Codes:      [place], Eliseo Gum 03/12/2015, 3:31 PM  03/12/2015  [place] Bing, PT [phone]   (pager)

## 2015-03-13 LAB — GLUCOSE, CAPILLARY
GLUCOSE-CAPILLARY: 96 mg/dL (ref 65–99)
GLUCOSE-CAPILLARY: 70 mg/dL (ref 65–99)
GLUCOSE-CAPILLARY: 98 mg/dL (ref 65–99)
GLUCOSE-CAPILLARY: 108 mg/dL — AB (ref 65–99)
Glucose-Capillary: 143 mg/dL — ABNORMAL HIGH (ref 65–99)
Glucose-Capillary: 146 mg/dL — ABNORMAL HIGH (ref 65–99)
Glucose-Capillary: 98 mg/dL (ref 65–99)

## 2015-03-13 MED ORDER — ALBUTEROL SULFATE (2.5 MG/3ML) 0.083% IN NEBU
2.5000 mg | INHALATION_SOLUTION | Freq: Two times a day (BID) | RESPIRATORY_TRACT | Status: DC
  Administered 2015-03-13 – 2015-03-15 (×4): 2.5 mg via RESPIRATORY_TRACT
  Filled 2015-03-13 (×4): qty 3

## 2015-03-13 MED ORDER — WHITE PETROLATUM GEL
Status: AC
  Filled 2015-03-13: qty 1

## 2015-03-13 MED ORDER — CLONAZEPAM 1 MG PO TABS
1.0000 mg | ORAL_TABLET | Freq: Every day | ORAL | Status: DC
  Administered 2015-03-13 – 2015-03-14 (×2): 1 mg via ORAL
  Filled 2015-03-13 (×2): qty 1

## 2015-03-13 MED ORDER — CLONAZEPAM 1 MG PO TABS: 1.0000 mg | ORAL_TABLET | Freq: Every day | ORAL | Status: DC

## 2015-03-13 MED ORDER — ALBUTEROL SULFATE (2.5 MG/3ML) 0.083% IN NEBU: 2.5000 mg | INHALATION_SOLUTION | RESPIRATORY_TRACT | Status: DC | PRN

## 2015-03-13 NOTE — Clinical Social Work Note (Signed)
CSW continuing to follow patient's progress, patient not ready for discharge.  Ervin KnackEric R. Giann Obara, MSW, Theresia MajorsLCSWA [phone] 03/13/2015 1:47 PM

## 2015-03-13 NOTE — Progress Notes (Addendum)
      [address]       [place] [id number]             [phone]      33 Days Post-Op Procedure(s) (LRB): STERNAL CLOSURE (N/A) TRANSESOPHAGEAL ECHOCARDIOGRAM (TEE) (N/A)   Subjective:  [name] is resting.  She just finished visiting with family.  She has no complaints.  Objective: Vital signs in last 24 hours: Temp:  [98 F (36.7 C)-100.2 F (37.9 C)] 99.1 F (37.3 C) (11/05 0811) Pulse Rate:  [88-113] 88 (11/05 1100) Cardiac Rhythm:  [-] Sinus tachycardia (11/05 0811) Resp:  [12-34] 21 (11/05 1100) BP: (104-140)/(44-98) 131/63 mmHg (11/05 1100) SpO2:  [96 %-100 %] 100 % (11/05 0937) FiO2 (%):  [28 %] 28 % (11/05 1100)  Intake/Output from previous day: 11/04 0701 - 11/05 0700 In: 1390 [P.O.:180; I.V.:30; NG/GT:1050; IV Piggyback:100] Out: 752 [Urine:751; Stool:1] Intake/Output this shift: Total I/O In: 50 [NG/GT:50] Out: -   General appearance: alert, cooperative and no distress Heart: regular rate and rhythm Lungs: clear to auscultation bilaterally Abdomen: soft, non-tender; bowel sounds normal; no masses,  no organomegaly Extremities: edema none Wound: clean and dry  Lab Results:  Recent Labs  03/12/15 0431  WBC 9.1  HGB 9.6*  HCT 32.1*  PLT 258   BMET:  Recent Labs  03/12/15 0430  NA 150*  K 4.2  CL 113*  CO2 28  GLUCOSE 189*  BUN 40*  CREATININE 0.99  CALCIUM 9.2    PT/INR: No results for input(s): LABPROT, INR in the last 72 hours. ABG    Component Value Date/Time   PHART 7.438 02/26/2015 0845   HCO3 28.1* 02/26/2015 0845   TCO2 24 02/26/2015 1554   ACIDBASEDEF 1.6 02/23/2015 0420   O2SAT 70.9 02/28/2015 0511   CBG (last 3)   Recent Labs  03/13/15 0357 03/13/15 0834 03/13/15 0949  GLUCAP 143* 70 98    Assessment/Plan: S/P Procedure(s) (LRB): STERNAL CLOSURE (N/A) TRANSESOPHAGEAL ECHOCARDIOGRAM (TEE) (N/A)  1. CV- remains hemodynamically stable- continue Digoxin, Lopressor, Hydralazine, NTG 2.  Pulm- tolerating trach, no acute issues 3. GI- continue tube feeds for now, continue Dysphagia 1 diet with nectar liquids per SSLP 4. Deconditioning- SNF at discharge 5. Dispo- patient stable, awaiting bed at SNF, likely Monday   LOS: 46 days    [name], [name] 03/13/2015  Patient doing well, medically stable with tracheostomy and PEG tube. Maintained sinus rhythm stable blood pressure afebrile. Currently off antibiotics.  Ready for transfer to skilled nursing facility when bed available.  patient examined and medical record reviewed,agree with above note. Kathlee Nationseter Van Trigt III 03/13/2015

## 2015-03-14 LAB — BASIC METABOLIC PANEL
Anion gap: 9 (ref 5–15)
BUN: 33 mg/dL — ABNORMAL HIGH (ref 6–20)
CO2: 28 mmol/L (ref 22–32)
Calcium: 9 mg/dL (ref 8.9–10.3)
Chloride: 110 mmol/L (ref 101–111)
Creatinine, Ser: 1 mg/dL (ref 0.44–1.00)
GFR calc Af Amer: >60 mL/min (ref >60)
GFR calc non Af Amer: 59 mL/min — ABNORMAL LOW (ref >60)
Glucose, Bld: 201 mg/dL — ABNORMAL HIGH (ref 65–99)
Potassium: 4.4 mmol/L (ref 3.5–5.1)
Sodium: 147 mmol/L — ABNORMAL HIGH (ref 135–145)

## 2015-03-14 LAB — GLUCOSE, CAPILLARY
GLUCOSE-CAPILLARY: 110 mg/dL — AB (ref 65–99)
GLUCOSE-CAPILLARY: 133 mg/dL — AB (ref 65–99)
GLUCOSE-CAPILLARY: 147 mg/dL — AB (ref 65–99)
GLUCOSE-CAPILLARY: 175 mg/dL — AB (ref 65–99)
GLUCOSE-CAPILLARY: 81 mg/dL (ref 65–99)
GLUCOSE-CAPILLARY: 89 mg/dL (ref 65–99)
Glucose-Capillary: 130 mg/dL — ABNORMAL HIGH (ref 65–99)

## 2015-03-14 LAB — CBC
HCT: 30.4 % — ABNORMAL LOW (ref 36.0–46.0)
Hemoglobin: 8.9 g/dL — ABNORMAL LOW (ref 12.0–15.0)
MCH: 27.3 pg (ref 26.0–34.0)
MCHC: 29.3 g/dL — ABNORMAL LOW (ref 30.0–36.0)
MCV: 93.3 fL (ref 78.0–100.0)
Platelets: 224 10*3/uL (ref 150–400)
RBC: 3.26 MIL/uL — ABNORMAL LOW (ref 3.87–5.11)
RDW: 17.9 % — ABNORMAL HIGH (ref 11.5–15.5)
WBC: 7.7 10*3/uL (ref 4.0–10.5)

## 2015-03-14 MED ORDER — CLONAZEPAM 0.5 MG PO TABS
0.5000 mg | ORAL_TABLET | Freq: Every day | ORAL | Status: DC
  Administered 2015-03-14 – 2015-03-15 (×2): 0.5 mg via ORAL
  Filled 2015-03-14 (×2): qty 1

## 2015-03-14 MED ORDER — INSULIN GLARGINE 100 UNIT/ML ~~LOC~~ SOLN: 6.0000 [IU] | Freq: Every day | SUBCUTANEOUS | Status: DC

## 2015-03-14 MED ORDER — DIPHENHYDRAMINE HCL 12.5 MG/5ML PO LIQD
6.2500 mg | Freq: Four times a day (QID) | ORAL | Status: DC | PRN
  Filled 2015-03-14: qty 2.5

## 2015-03-14 NOTE — Progress Notes (Addendum)
      [address]       [place] [id number]             [phone]      34 Days Post-Op Procedure(s) (LRB): STERNAL CLOSURE (N/A) TRANSESOPHAGEAL ECHOCARDIOGRAM (TEE) (N/A)   Subjective:  Ms. Marca AnconaGilmore has no complaints.  She is anxious to get out of here.  Objective: Vital signs in last 24 hours: Temp:  [98 F (36.7 C)-98.6 F (37 C)] 98 F (36.7 C) (11/06 0744) Pulse Rate:  [87-122] 109 (11/06 0818) Cardiac Rhythm:  [-] Sinus tachycardia (11/06 0758) Resp:  [18-31] 20 (11/06 0818) BP: (109-159)/(59-83) 159/83 mmHg (11/06 0737) SpO2:  [96 %-100 %] 96 % (11/06 0818) FiO2 (%):  [28 %-35 %] 35 % (11/06 0818) Weight:  [104 lb 8 oz (47.4 kg)] 104 lb 8 oz (47.4 kg) (11/06 0407)  Intake/Output from previous day: 11/05 0701 - 11/06 0700 In: 680 [NG/GT:500; IV Piggyback:50] Out: 5 [Urine:3; Stool:2] Intake/Output this shift: Total I/O In: 250 [NG/GT:150; IV Piggyback:100] Out: 400 [Urine:400]  General appearance: alert, cooperative and no distress Heart: regular rate and rhythm Lungs: clear to auscultation bilaterally Abdomen: soft, non-tender; bowel sounds normal; no masses,  no organomegaly Extremities: no edema Wound: clean and dry  Lab Results:  Recent Labs  03/12/15 0431 03/14/15 0415  WBC 9.1 7.7  HGB 9.6* 8.9*  HCT 32.1* 30.4*  PLT 258 224   BMET:  Recent Labs  03/12/15 0430 03/14/15 0415  NA 150* 147*  K 4.2 4.4  CL 113* 110  CO2 28 28  GLUCOSE 189* 201*  BUN 40* 33*  CREATININE 0.99 1.00  CALCIUM 9.2 9.0    PT/INR: No results for input(s): LABPROT, INR in the last 72 hours. ABG    Component Value Date/Time   PHART 7.438 02/26/2015 0845   HCO3 28.1* 02/26/2015 0845   TCO2 24 02/26/2015 1554   ACIDBASEDEF 1.6 02/23/2015 0420   O2SAT 70.9 02/28/2015 0511   CBG (last 3)   Recent Labs  03/13/15 2332 03/14/15 0406 03/14/15 0807  GLUCAP 133* 175* 110*    Assessment/Plan: S/P Procedure(s) (LRB): STERNAL CLOSURE  (N/A) TRANSESOPHAGEAL ECHOCARDIOGRAM (TEE) (N/A)  1. CV- remains hemodynamically stable 2. Pulm- continues to tolerate trach, no acute issues 3. GI- on tube feeds, tolerating diet.... May be able to decrease rate soon 4. Deconditioning- SNF tomorrow if bed available 5. Dispo- patient is medically stable, awaiting bed at SNF, hopefully tomorrow   LOS: 47 days    [name], [name] 03/14/2015  patient examined and medical record reviewed,agree with above note. Kathlee Nationseter Van Trigt III 03/14/2015

## 2015-03-15 DIAGNOSIS — J9621 Acute and chronic respiratory failure with hypoxia: Secondary | ICD-10-CM

## 2015-03-15 LAB — GLUCOSE, CAPILLARY
GLUCOSE-CAPILLARY: 118 mg/dL — AB (ref 65–99)
GLUCOSE-CAPILLARY: 157 mg/dL — AB (ref 65–99)
Glucose-Capillary: 94 mg/dL (ref 65–99)

## 2015-03-15 MED ORDER — RESOURCE THICKENUP CLEAR PO POWD: ORAL | Status: DC

## 2015-03-15 MED ORDER — METOPROLOL TARTRATE 25 MG PO TABS: 25.0000 mg | ORAL_TABLET | Freq: Two times a day (BID) | ORAL | Status: DC

## 2015-03-15 MED ORDER — DIGOXIN 62.5 MCG PO TABS: 0.0625 mg | ORAL_TABLET | Freq: Every day | ORAL | Status: DC

## 2015-03-15 MED ORDER — CLONAZEPAM 1 MG PO TABS: 1.0000 mg | ORAL_TABLET | Freq: Every day | ORAL | Status: DC

## 2015-03-15 MED ORDER — CLONAZEPAM 0.5 MG PO TABS: 0.5000 mg | ORAL_TABLET | Freq: Every day | ORAL | Status: DC

## 2015-03-15 MED ORDER — MAGIC MOUTHWASH: 10.0000 mL | Freq: Three times a day (TID) | ORAL | Status: DC | PRN

## 2015-03-15 MED ORDER — ASPIRIN 325 MG PO TBEC: 325.0000 mg | DELAYED_RELEASE_TABLET | Freq: Every day | ORAL | Status: DC

## 2015-03-15 MED ORDER — FUROSEMIDE 20 MG PO TABS: 20.0000 mg | ORAL_TABLET | Freq: Every day | ORAL | Status: DC

## 2015-03-15 MED ORDER — ALBUTEROL SULFATE (2.5 MG/3ML) 0.083% IN NEBU: 2.5000 mg | INHALATION_SOLUTION | RESPIRATORY_TRACT | Status: DC | PRN

## 2015-03-15 MED ORDER — FUROSEMIDE 20 MG PO TABS
20.0000 mg | ORAL_TABLET | Freq: Every day | ORAL | Status: DC
  Administered 2015-03-15: 20 mg via ORAL
  Filled 2015-03-15: qty 1

## 2015-03-15 MED ORDER — ONDANSETRON HCL 4 MG/2ML IJ SOLN: 4.0000 mg | Freq: Four times a day (QID) | INTRAMUSCULAR | Status: DC | PRN

## 2015-03-15 MED ORDER — CHLORHEXIDINE GLUCONATE 0.12% ORAL RINSE (MEDLINE KIT): 15.0000 mL | Freq: Two times a day (BID) | OROMUCOSAL | Status: DC

## 2015-03-15 MED ORDER — PANTOPRAZOLE SODIUM 40 MG PO PACK: 40.0000 mg | PACK | Freq: Every day | ORAL | Status: DC

## 2015-03-15 MED ORDER — HYDRALAZINE HCL 10 MG PO TABS: 10.0000 mg | ORAL_TABLET | Freq: Three times a day (TID) | ORAL | Status: DC

## 2015-03-15 MED ORDER — SCOPOLAMINE 1 MG/3DAYS TD PT72: 1.0000 | MEDICATED_PATCH | TRANSDERMAL | Status: DC

## 2015-03-15 MED ORDER — BACID PO TABS: 2.0000 | ORAL_TABLET | Freq: Two times a day (BID) | ORAL | Status: DC

## 2015-03-15 MED ORDER — ENOXAPARIN SODIUM 30 MG/0.3ML ~~LOC~~ SOLN: 30.0000 mg | SUBCUTANEOUS | Status: DC

## 2015-03-15 MED ORDER — JEVITY 1.2 CAL PO LIQD: 1000.0000 mL | ORAL | Status: DC

## 2015-03-15 MED ORDER — ACETAMINOPHEN 160 MG/5ML PO SOLN: 650.0000 mg | Freq: Four times a day (QID) | ORAL | Status: DC | PRN

## 2015-03-15 MED ORDER — GERHARDT'S BUTT CREAM: 1.0000 "application " | TOPICAL_CREAM | Freq: Four times a day (QID) | CUTANEOUS | Status: DC | PRN

## 2015-03-15 MED ORDER — QUETIAPINE FUMARATE 100 MG PO TABS: 100.0000 mg | ORAL_TABLET | Freq: Every day | ORAL | Status: DC

## 2015-03-15 NOTE — Clinical Social Work Placement (Signed)
   CLINICAL SOCIAL WORK PLACEMENT  NOTE  Date:  03/15/2015  Patient Details  Name: Breanna QuarryMaria T Edelman MRN: [id number] Date of Birth: 09/14/1950  Clinical Social Work is seeking post-discharge placement for this patient at the Skilled  Nursing Facility level of care (*CSW will initial, date and re-position this form in  chart as items are completed):  No   Patient/family provided with [facility] Clinical Social Work Department's list of facilities offering this level of care within the geographic area requested by the patient (or if unable, by the patient's family).  Yes   Patient/family informed of their freedom to choose among providers that offer the needed level of care, that participate in Medicare, Medicaid or managed care program needed by the patient, have an available bed and are willing to accept the patient.  No   Patient/family informed of [facility]'s ownership interest in [facility] and [facility], as well as of the fact that they are under no obligation to receive care at these facilities.  PASRR submitted to EDS on 03/07/15     PASRR number received on 03/07/15     Existing PASRR number confirmed on       FL2 transmitted to all facilities in geographic area requested by pt/family on 03/07/15     FL2 transmitted to all facilities within larger geographic area on       Patient informed that his/her managed care company has contracts with or will negotiate with certain facilities, including the following:        Yes   Patient/family informed of bed offers received.  [facility]     Physician recommends and patient chooses bed at      [facility].  Patient to be transferred to facility by [facility]      Patient family notified on 03/15/15 of transfer.  Name of family member notified:  Malena EdmanCharles Gilmoere, husband      PHYSICIAN       Additional Comment:     _______________________________________________ Gwynne EdingerBibbs, Dewey Neukam, LCSW 03/15/2015, 9:35 AM

## 2015-03-15 NOTE — Progress Notes (Signed)
Removed sutures from ches.

## 2015-03-15 NOTE — Progress Notes (Signed)
Called erin barrett pa to see if pt needs picc, ok to get orders to d/c picc.

## 2015-03-15 NOTE — Plan of Care (Signed)
Problem: Discharge Progression Outcomes Goal: Discharge plan in place and appropriate Outcome: Adequate for Discharge Going to [facility] for rehab

## 2015-03-15 NOTE — Progress Notes (Signed)
      [address]       [place] [id number]             [phone]      35 Days Post-Op Procedure(s) (LRB): STERNAL CLOSURE (N/A) TRANSESOPHAGEAL ECHOCARDIOGRAM (TEE) (N/A)   Subjective:  Trying to rest, had rough night and didn't sleep much.  Objective: Vital signs in last 24 hours: Temp:  [97.6 F (36.4 C)-100 F (37.8 C)] 98.2 F (36.8 C) (11/07 0739) Pulse Rate:  [100-121] 110 (11/07 0753) Cardiac Rhythm:  [-] Sinus tachycardia (11/07 0739) Resp:  [14-32] 15 (11/07 0753) BP: (120-151)/(58-82) 120/58 mmHg (11/07 0739) SpO2:  [93 %-100 %] 98 % (11/07 0753) FiO2 (%):  [21 %-28 %] 28 % (11/07 0753) Weight:  [104 lb 11.5 oz (47.5 kg)] 104 lb 11.5 oz (47.5 kg) (11/07 0400)  Intake/Output from previous day: 11/06 0701 - 11/07 0700 In: 1250 [NG/GT:1150; IV Piggyback:100] Out: 400 [Urine:400] Intake/Output this shift: Total I/O In: 82.5 [NG/GT:82.5] Out: -   General appearance: alert, cooperative and no distress Heart: regular rate and rhythm Lungs: clear to auscultation bilaterally Abdomen: soft, non-tender; bowel sounds normal; no masses,  no organomegaly Wound: clean and dry  Lab Results:  Recent Labs  03/14/15 0415  WBC 7.7  HGB 8.9*  HCT 30.4*  PLT 224   BMET:  Recent Labs  03/14/15 0415  NA 147*  K 4.4  CL 110  CO2 28  GLUCOSE 201*  BUN 33*  CREATININE 1.00  CALCIUM 9.0    PT/INR: No results for input(s): LABPROT, INR in the last 72 hours. ABG    Component Value Date/Time   PHART 7.438 02/26/2015 0845   HCO3 28.1* 02/26/2015 0845   TCO2 24 02/26/2015 1554   ACIDBASEDEF 1.6 02/23/2015 0420   O2SAT 70.9 02/28/2015 0511   CBG (last 3)   Recent Labs  03/14/15 2318 03/15/15 0351 03/15/15 0834  GLUCAP 81 118* 157*    Assessment/Plan: S/P Procedure(s) (LRB): STERNAL CLOSURE (N/A) TRANSESOPHAGEAL ECHOCARDIOGRAM (TEE) (N/A)  1. CV- remains hemodynamically stable- continue Lopressor, Hydralazine, Digoxin 2. Pulm-  tolerating trach, continue PM valve, tolerating well 3. GI- continue tube feeds for now, continue current diet per SSLP recs 4. Dispo- patient is medically stable, plan for d/c to SNF if bed available   LOS: 48 days    Kreston Ahrendt 03/15/2015

## 2015-03-15 NOTE — Progress Notes (Signed)
Speech Language Pathology Treatment: Dysphagia;Cognitive-Linquistic;Passy Muir Speaking valve  Patient Details Name: Breanna Alvarez MRN: [id number] DOB: 08/14/1950 Today's Date: 03/15/2015 Time: 1040-1100 SLP Time Calculation (min) (ACUTE ONLY): 20 min  Assessment / Plan / Recommendation Clinical Impression  Cuff was deflated and PMSV placed prior to SLP arrival. Pt's vitals were consistent throughout session, no air trapping was noted. Pt was able to orally expropriate secretions, coughing up secretons throughout session. Pt presented with improving delirium and was alert, oriented to self, place, situation, but not time. SLP provided moderate verbal cues to increase vocal intensity, intelligibility, and assisted with problem solving. Pt fed self a few bites of puree and sips of nectar thick liquids with no overt s/s of aspiration. Pt will benefit from continued SLP services upon d/c for dysphagia and increasing vocal intensity and intelligibility.   HPI Other Pertinent Information: 64 yo female never smoker with hx HTN, CAD previously refused CABG ultimately admitted 9/20 for CVTS eval and CABG after ongoing chest pain. On 02/01/15 she had a CORONARY ARTERY BYPASS GRAFTING (CABG), MITRAL VALVE REPAIR (MVR), and TRANSESOPHAGEAL ECHOCARDIOGRAM (TEE) and has had complicated course with cardiogenic shock s/p centrimag placement (temporary LVAD) and removal (on 9/30), ischemic cardiomyopathy with EF 20%, open sternum now s/p sternal closure 10/3. She remained intubated since initial CABG 9/26 with failure to wean, she was trached (6mm Shiley) on 02/15/15, trach collar trials started. She had a possible aspiration event from tube feeds on 10/13, PEG placed 10/19.    Pertinent Vitals Pain Assessment: Faces Faces Pain Scale: No hurt  SLP Plan  Continue with current plan of care    Recommendations Diet recommendations: Dysphagia 1 (puree);Nectar-thick liquid Liquids provided via: Cup;Straw Medication  Administration: Whole meds with puree Supervision: Full supervision/cueing for compensatory strategies;Staff to assist with self feeding Compensations: Slow rate;Small sips/bites;Multiple dry swallows after each bite/sip;Minimize environmental distractions Postural Changes and/or Swallow Maneuvers: Seated upright 90 degrees      Patient may use Passy-Muir Speech Valve: During all waking hours (remove during sleep);During all therapies with supervision PMSV Supervision: Intermittent MD: Please consider changing trach tube to : Cuffless       Plan: Continue with current plan of care    GO    Riccardo DubinKristen Buffey Alvarez, Student-SLP  Riccardo DubinKristen Eudell Julian 03/15/2015, 11:02 AM

## 2015-03-15 NOTE — Progress Notes (Signed)
Pt discharged to [facility] via [facility]tar, trach collar 6l/min for transport. Voided prior to transport. Husband at bedside. Belongings sent with family.

## 2015-03-15 NOTE — Progress Notes (Signed)
Removed 11 staples from mid sternum, ok to remove per erin barrett pa

## 2015-03-15 NOTE — Progress Notes (Signed)
PULMONARY / CRITICAL CARE MEDICINE   Name: Breanna Alvarez MRN: [id number] DOB: Oct 10, 1950    ADMISSION DATE:  01/26/2015 CONSULTATION DATE:  02/09/2105  REFERRING MD :  Donata Clay   CHIEF COMPLAINT:  Vent management   INITIAL PRESENTATION:  64 yo female admitted with chest pain from CAD.  She had CABG 02/01/15 >> post-op complicated by cardiogenic shock s/p centrimag placement and removal, ischemic cardiomyopathy with EF 20%, open sternum.  She remained on vent post-op and PCCM consulted to assist with vent weaning.  STUDIES:  9/21 Echo - EF 20-25%, mod MR, PA press 51 mmHg 10/15 Echo - EF 25 to 30%, PAS 36 mmHg 10/31 Portable CXR - no new focal opacity or effusion.  CULTURES: Resp Ctx (10/24):  Negative Blood Ctx (10/18):  Negative Resp Ctx (10/17):  Negative Urine Ctx (10/17):  Negative Blood Ctx (10/13):  Negative Resp Ctx (10/12):  Negative Resp Ctx (10/4):  Negative  ANTIBIOTICS: Elita Quick 10/26 >> 11/06 Zosyn 10/12 - 10/21  SIGNIFICANT EVENTS: 09/26  CABG x 4, placement of centrimag L ventricular assistive device  09/30  removal centrimag  10/03  sternal closure 10/09  Hg drop overnight by 1 gm and transfusion ordered 10/10  Trach by DF 10/12  Off precedex 10/13  possible aspiration event; TNA started 10/14  difficulty with agitation 10/16  off pressors 10/17  off TNA 10/18  Fever 101, transfuse 1 unit PRBC 10/20  start trach collar trials; off milrinone; G tube placed 10/23  Tolerating trach collar at daytime. On vent at night. 10/24  atc x daytime. Sitting in chair. RN who had her before weekend reports no issues. More alert. Nods to simple Qs 10/26  Remains on TC 10/28  Now on PMV. Had to go back on vent briefly yesterday. Getting daily Nocturnal Vent per RN.     SUBJECTIVE:   RN reports no acute events, mild increase in secretions.  Pt able to clear secretions with cough.   Marland Kitchen  VITAL SIGNS: Temp:  [97.6 F (36.4 C)-100 F (37.8 C)] 98.2 F (36.8 C)  (11/07 0739) Pulse Rate:  [100-121] 110 (11/07 0753) Resp:  [14-32] 15 (11/07 0753) BP: (120-151)/(58-82) 120/58 mmHg (11/07 0739) SpO2:  [93 %-100 %] 98 % (11/07 0753) FiO2 (%):  [21 %-28 %] 28 % (11/07 0753) Weight:  [104 lb 11.5 oz (47.5 kg)] 104 lb 11.5 oz (47.5 kg) (11/07 0400)   HEMODYNAMICS:     VENTILATOR SETTINGS: Vent Mode:  [-]  FiO2 (%):  [21 %-28 %] 28 %  INTAKE / OUTPUT:  Intake/Output Summary (Last 24 hours) at 03/15/15 1021 Last data filed at 03/15/15 0739  Gross per 24 hour  Intake 1082.5 ml  Output      0 ml  Net 1082.5 ml   PHYSICAL EXAMINATION: General:  No acute distress, lying in bed Integument:  Warm & dry. No rash on exposed skin.  HEENT:  Moist mucus membranes. No scleral injection. Tracheostomy in place, thin clear secretions  Cardiovascular:  Regular rhythm and rate. No edema.  Pulmonary: Normal work of breathing off tracheostomy collar. Abdomen: Soft. Normal bowel sounds. PEG tube in place.  Musculoskeletal:  Normal bulk and tone. No joint deformity or effusion appreciated.   LABS:  PULMONARY No results for input(s): PHART, PCO2ART, PO2ART, HCO3, TCO2, O2SAT in the last 168 hours.  Invalid input(s): PCO2, PO2  CBC  Recent Labs Lab 03/09/15 0350 03/12/15 0431 03/14/15 0415  HGB 9.7* 9.6* 8.9*  HCT 32.0* 32.1*  30.4*  WBC 8.3 9.1 7.7  PLT 338 258 224   CHEMISTRY  Recent Labs Lab 03/09/15 0350 03/10/15 0845 03/12/15 0430 03/14/15 0415  NA 149* 147* 150* 147*  K 3.7 4.0 4.2 4.4  CL 110 109 113* 110  CO2 30 30 28 28   GLUCOSE 193* 127* 189* 201*  BUN 62* 50* 40* 33*  CREATININE 1.16* 0.96 0.99 1.00  CALCIUM 9.4 9.7 9.2 9.0  PHOS  --  4.4 5.0*  --    Estimated Creatinine Clearance: 41.9 mL/min (by C-G formula based on Cr of 1).  LIVER  Recent Labs Lab 03/10/15 0845 03/12/15 0430  ALBUMIN 2.5* 2.4*   ENDOCRINE CBG (last 3)   Recent Labs  03/14/15 2318 03/15/15 0351 03/15/15 0834  GLUCAP 81 118* 157*    Iron/TIBC/Ferritin/ %Sat    Component Value Date/Time   IRON 19* 02/22/2015 0900   TIBC 143* 02/22/2015 0900   FERRITIN 1025* 02/22/2015 0900   IRONPCTSAT 13 02/22/2015 0900    IMAGING x48h No results found.  ASSESSMENT / PLAN: 64 year old female status post CABG with acute hypoxic respiratory failure. Secretions are improving with scopolamine patch. Patient continuing on treatment for healthcare associated pneumonia without adverse effect. Hypernatremia improving off of Lasix. I suspect the decreased urine output is likely secondary to discontinuation of the catheter as well as discontinuing Lasix at this time. Patient planned to transition to a stepdown unit. Abdominal pain of unclear significance and possibly secondary to the patient being more awake and alert.  271. 64 year old female status post CABG with acute hypoxic respiratory failure. Secretions are improving with scopolamine patch. Patient continuing on treatment for healthcare associated pneumonia. Tracheostomy placement on 10/10 by Dr. Tyson AliasFeinstein. Continuing scopolamine patch which seems to be improving patient's oral secretions. 2. Aspiration pneumonia/HCAP: Repeat culture from 10/24 negative. Completed Fortaz 11/6 3. Hypernatremia: Mild but improving. Low dose lasix, 20 mg QD 4. Cardiogenic shock: Resolved. 5. Acute renal failure: Resolved. Continuing to trend urine output. Monitoring renal function with daily BUN/creatinine. 6. Hypokalemia: Resolved.  7. Coronary artery disease w/ ICM: Status post CABG. Currently on aspirin, Lopressor, nitroglycerin patch, & hydralazine. 8. Protein calorie malnutrition: Advance diet per SLP recommendations 9. Anemia: Hemoglobin stable. No signs of active bleeding.  10. Delirium: Multifactorial but improving. Continuing Klonopin twice a day & Seroquel. 11. Prophylaxis: Lovenox subcutaneous daily, SCDs, & Protonix via tube. 12. Disposition: Medical social worker arranging for transition to a  local facility. PT/OT/SLP consulted.   PCCM will see PRN for trach care.     Canary BrimBrandi Roisin Mones, NP-C [facility] Pgr: [phone] or if no answer [phone] 03/15/2015, 10:21 AM

## 2015-03-24 ENCOUNTER — Telehealth: Payer: Self-pay | Admitting: Internal Medicine

## 2015-03-24 NOTE — Telephone Encounter (Signed)
That's fine

## 2015-03-24 NOTE — Telephone Encounter (Signed)
Called and spoke to Breanna Alvarez and informed her of the recs per MW. Breanna Alvarez verbalized understanding and denied any further questions or concerns at this time.

## 2015-03-24 NOTE — Telephone Encounter (Signed)
[facility] [place] [facility]. Pt is unable to speak or eat with trach cap on. Pt becomes SOB when wearing the trach cap. [name] stated the trach has been cleaned and the pt was suctioned with little to no mucus return. [name] was instructed by SLP to keep cap on when she is eating or wanting to speak. Pt has a decrease in intake and has been refusing PEG tube feedings last week. [name] stated she took one can in the afternoon this week and that is all pt is taking in. Pt is depressed she is not able to eat or speak. [name] is requesting recs. Pt has HFU with Dr. Sherene SiresWert on 03/30/15.  Dr. Sherene SiresWert please advise. Thanks.

## 2015-03-24 NOTE — Telephone Encounter (Signed)
Spoke with [name], aware of rec's per MW [name] states that she is not "refusing" to eat, she does not want to eat by PEG tube, she is wanting to physically eat. Breanna Alvarez states that Speech Therapist does not allow the residents with trachs to eat with the cap open, this must be closed. Per [name], the patient is unable to breathe d/t SOB while cap is closed. [name] states that since the patient has the desire to eat by mouth, they are wanting Dr Sherene SiresWert to give the okay for her to eat with the cap open. Please advise. Thanks.

## 2015-03-24 NOTE — Telephone Encounter (Signed)
Breathing has priority over speaking so should leave the cap off if sob at all.    Can't help her on the refusal to allow feeding or depression as not pulmonary issues > defer to primary care or the discharging service to address (T surgery)

## 2015-03-28 NOTE — Progress Notes (Signed)
Cardiology Office Note   Date:  03/29/2015   ID:  Breanna Alvarez, DOB 1951-05-02, MRN [id number]  PCP:  No PCP Per Patient  Cardiologist:  Dr. Grayland Jack    Chief Complaint  Patient presents with  . Hospitalization Follow-up     wants to come off some of BP meds, statin, feeding tube meds.      History of Present Illness: Breanna Alvarez is a 64 y.o. female who presents for post hospitalization post CABG, prolonged Vent.  Trach and PEG.  Was discharged to SNF.   She has a hx of NSTEMI in early 2016. She was diagnosed with multivessel CAD and CABG was recommended. She declined CABG and had been trying to change her diet and using natural treatment for her CAD.  She had chronic systolic HF, ICM with EF   She was admitted 01/26/15 with [facility]>  She had CABG X 4  02/01/15. (LIMA-LAD, SVG-D, SVG-OM, SVG-PD) ENDOSCOPIC GREATER SAPHENOUS VEIN HARVEST BILATERAL THIGHS and placement of LVAD due to significant LV dysfunction and hemodynamic instability .  LVAD removed 02/07/15. Sternum was closed 02/09/15.  She had difficulty with weaning Vent, ARDS, PNA and had tracheostomy 02/15/15. Gastrostomy tube placed 02/25/15.  She gradually improved and was weaned off vent.  By discharge she could take dysphasia 1 diet though  But continued with tube feeding. She had PT and was discharged to a SNF. Discharged on ASA 325. Dig 62.5, lasix 20, lopressor 25 mg BID, pravastatin. She was on scopolamine for oral secretions.  Seroquel klonopin for deliruim.   Oxygen 6L at discharge on 03/15/15. At discharge H/H 8.9/30, NA 147.  Today:  She walked in and still with difficulty talking, but no chest pain and no SOB.  Her husband is asking when to decrease her meds.  We discussed importance of allowing her heart to heal.  I went over meds and explained why she needed them.  He stated she was no longer on klonipin and Seroquel- she has had no further confusion.  She is still having some tube feedings but tends to voit  those feedings back up.  She is working with PT.  No palpitations.    Past Medical History  Diagnosis Date  . Hyperlipidemia   . Acute systolic CHF (congestive heart failure), NYHA class 3 (HCC) 07/10/2014  . Hypertension     Past Surgical History  Procedure Laterality Date  . No past surgeries    . Left heart catheterization with coronary angiogram N/A 07/13/2014    Procedure: LEFT HEART CATHETERIZATION WITH CORONARY ANGIOGRAM;  Surgeon: Corky Crafts, MD; mLAD 95%, dLAD 95%, D1 95%, CFX severe dz, mRCA 80%  . Coronary artery bypass graft N/A 02/01/2015    Procedure: CORONARY ARTERY BYPASS GRAFTING (CABG) X 4 UTILIZING THE LEFT INTERNAL MAMMARY ARTERY TO LAD, ENDOSCOPICALLY HARVESTED BILATERAL SAPHENEOUS VEIN GRAFTS  TO DIAGONAL, OM AND PD.;  Surgeon: Kerin Perna, MD;  Location: [facility] OR;  Service: Open Heart Surgery;  Laterality: N/A;  . Tee without cardioversion N/A 02/01/2015    Procedure: TRANSESOPHAGEAL ECHOCARDIOGRAM (TEE);  Surgeon: Kerin Perna, MD;  Location: [facility] OR;  Service: Open Heart Surgery;  Laterality: N/A;  . Placement of centrimag ventricular assist device N/A 02/01/2015    Procedure: PLACEMENT OF CENTRIMAG VENTRICULAR ASSIST DEVICE;  Surgeon: Kerin Perna, MD;  Location: [facility] OR;  Service: Open Heart Surgery;  Laterality: N/A;  . Removal of centrimag ventricular assist device N/A 02/05/2015    Procedure: REMOVAL  OF CENTRIMAG VENTRICULAR ASSIST DEVICE;  Surgeon: Kerin Perna, MD;  Location: [facility] OR;  Service: Open Heart Surgery;  Laterality: N/A;  . Cannulation for cardiopulmonary bypass N/A 02/05/2015    Procedure: CANNULATION FOR CARDIOPULMONARY BYPASS;  Surgeon: Kerin Perna, MD;  Location: [facility] OR;  Service: Open Heart Surgery;  Laterality: N/A;  . Tee without cardioversion N/A 02/05/2015    Procedure: TRANSESOPHAGEAL ECHOCARDIOGRAM (TEE);  Surgeon: Kerin Perna, MD;  Location: [facility] OR;  Service: Open Heart Surgery;  Laterality: N/A;  . Sternal closure N/A  02/08/2015    Procedure: STERNAL CLOSURE;  Surgeon: Kerin Perna, MD;  Location: [facility] OR;  Service: Thoracic;  Laterality: N/A;  . Tee without cardioversion N/A 02/08/2015    Procedure: TRANSESOPHAGEAL ECHOCARDIOGRAM (TEE);  Surgeon: Kerin Perna, MD;  Location: [facility] OR;  Service: Thoracic;  Laterality: N/A;     Current Outpatient Prescriptions  Medication Sig Dispense Refill  . albuterol (PROVENTIL) (2.5 MG/3ML) 0.083% nebulizer solution Take 3 mLs (2.5 mg total) by nebulization every 4 (four) hours as needed for wheezing or shortness of breath. 75 mL 12  . aspirin EC 325 MG EC tablet Take 1 tablet (325 mg total) by mouth daily. 30 tablet 0  . chlorhexidine gluconate (PERIDEX) 0.12 % solution 15 mLs by Mouth Rinse route 2 (two) times daily. 120 mL 0  . Digoxin 62.5 MCG TABS Take 0.0625 mg by mouth daily.    Marland Kitchen enoxaparin (LOVENOX) 30 MG/0.3ML injection Inject 0.3 mLs (30 mg total) into the skin daily. 0 Syringe   . furosemide (LASIX) 20 MG tablet Take 1 tablet (20 mg total) by mouth daily. 30 tablet   . hydrALAZINE (APRESOLINE) 10 MG tablet Take 1 tablet (10 mg total) by mouth every 8 (eight) hours.    . Hydrocortisone (GERHARDT'S BUTT CREAM) CREA Apply 1 application topically 4 (four) times daily as needed for irritation.    . lactobacillus acidophilus (BACID) TABS tablet Take 2 tablets by mouth 2 (two) times daily.    . magic mouthwash SOLN Take 10 mLs by mouth 3 (three) times daily as needed for mouth pain.  0  . Maltodextrin-Xanthan Gum (RESOURCE THICKENUP CLEAR) POWD All liquids should be nectar thick    . metoprolol tartrate (LOPRESSOR) 25 MG tablet Take 1 tablet (25 mg total) by mouth 2 (two) times daily.    . Nutritional Supplements (FEEDING SUPPLEMENT, JEVITY 1.2 CAL,) LIQD Place 1,000 mLs into feeding tube continuous. Currently at 3ml/hr  0  . ondansetron (ZOFRAN) 4 MG/2ML SOLN injection Inject 2 mLs (4 mg total) into the vein every 6 (six) hours as needed for nausea or vomiting. 2  mL 0  . pravastatin (PRAVACHOL) 80 MG tablet Take 1 tablet (80 mg total) by mouth every evening. (Patient taking differently: Take 40 mg by mouth every evening. ) 30 tablet 3  . scopolamine (TRANSDERM-SCOP) 1 MG/3DAYS Place 1 patch (1.5 mg total) onto the skin every 3 (three) days. 10 patch 12   No current facility-administered medications for this visit.    Allergies:   Atorvastatin and Crestor    Social History:  The patient  reports that she has never smoked. She has never used smokeless tobacco. She reports that she does not drink alcohol or use illicit drugs.   Family History:  The patient's family history includes Heart attack in her mother; Stroke in her father.    ROS:  General:no colds or fevers,+ weight loss Skin:no rashes or ulcers HEENT:no blurred vision, no  congestion CV:see HPI PUL:see HPI GI:no diarrhea constipation or melena, no indigestion GU:no hematuria, no dysuria MS:no joint pain, no claudication Neuro:no syncope, no lightheadedness Endo:no diabetes, no thyroid disease  Wt Readings from Last 3 Encounters:  03/29/15 89 lb (40.37 kg)  03/15/15 104 lb 11.5 oz (47.5 kg)  01/22/15 102 lb 3.2 oz (46.358 kg)     PHYSICAL EXAM: VS:  BP 122/64 mmHg  Pulse 111  Ht 5' (1.524 m)  Wt 89 lb (40.37 kg)  BMI 17.38 kg/m2 , BMI Body mass index is 17.38 kg/(m^2). General:Pleasant affect, NAD Skin:Warm and dry, brisk capillary refill HEENT:normocephalic, sclera clear, mucus membranes moist Neck:supple, no JVD, no bruits  Heart:S1S2 RRR without murmur, gallup, rub or click, chest wall incision healing well Lungs: without rales, + rhonchi, no wheezes ZOX:WRUEAbd:soft, non tender, + BS, do not palpate liver spleen or masses Ext:no lower ext edema,  2+ radial pulses Neuro:alert and oriented X 3, MAE, follows commands, + facial symmetry    EKG:  EKG is ordered today. The ekg ordered today demonstrates stach at 111, Rt atrial enlargement, ST T wave abnormality   Recent  Labs: 07/10/2014: B Natriuretic Peptide 1059.1* 07/14/2014: TSH 3.572 03/08/2015: Magnesium 2.2 03/29/2015: ALT 15; BUN 25; Creat 1.25*; Hemoglobin 13.0; Platelets 335; Potassium 3.3*; Sodium 138    Lipid Panel    Component Value Date/Time   CHOL 223* 01/27/2015 0310   TRIG 171* 02/22/2015 0340   HDL 25* 01/27/2015 0310   CHOLHDL 8.9 01/27/2015 0310   VLDL 19 01/27/2015 0310   LDLCALC 179* 01/27/2015 0310       Other studies Reviewed: Additional studies/ records that were reviewed today include: hospital notes, previous OV notes, previous cath. OR notes.  ECHO:  02/20/15  Study Conclusions  - Procedure narrative: Transthoracic echocardiography. Image quality was adequate. The study was technically difficult, as a result of surgical dressings. - Left ventricle: The cavity size was normal. Wall thickness was normal. Systolic function was severely reduced. The estimated ejection fraction was in the range of 25% to 30%. - Left atrium: The atrium was moderately dilated. - Right ventricle: RV is not seen well enough to evaluate function. - Pulmonary arteries: PA peak pressure: 36 mm Hg (S).  ASSESSMENT AND PLAN:  1. CAD s/p CABG X 4 01/2015  Complex recovery. But continues to progress-follow up in 6 weeks with Dr. Eldridge DaceVaranasi. Continue meds  2. ICM EF 25-30% will recheck echo in 2 months check cbc, cmet and dig level today  3.  S/p trach, ARDS, PNA- with rhonchi she is to see Dr. Sherene SiresWert tomorrow he may proceed with CXR.- I worry about aspiration   4. Malnutrition still with poor intake  5. HTN controlled today  6. S. Tach continue dig and BB.   7. Hyperlipidemia con't statin        Current medicines are reviewed with the patient today.  The patient Has no concerns regarding medicines.  The following changes have been made:  See above Labs/ tests ordered today include:see above  Disposition:   FU:  see above  Nyoka LintSigned, Lance Huaracha R, NP  03/29/2015 11:19 PM     [facility] [address], [place], [place]  [address]/ [address] [place], [place] Phone: [phone]; Fax: [phone]  [phone]

## 2015-03-29 ENCOUNTER — Encounter: Payer: Self-pay | Admitting: Cardiology

## 2015-03-29 ENCOUNTER — Ambulatory Visit (INDEPENDENT_AMBULATORY_CARE_PROVIDER_SITE_OTHER): Payer: Medicaid Other | Admitting: Cardiology

## 2015-03-29 VITALS — BP 122/64 | HR 111 | Ht 60.0 in | Wt 89.0 lb

## 2015-03-29 DIAGNOSIS — I10 Essential (primary) hypertension: Secondary | ICD-10-CM | POA: Diagnosis not present

## 2015-03-29 DIAGNOSIS — I255 Ischemic cardiomyopathy: Secondary | ICD-10-CM | POA: Diagnosis not present

## 2015-03-29 DIAGNOSIS — E43 Unspecified severe protein-calorie malnutrition: Secondary | ICD-10-CM

## 2015-03-29 DIAGNOSIS — Z951 Presence of aortocoronary bypass graft: Secondary | ICD-10-CM

## 2015-03-29 DIAGNOSIS — E785 Hyperlipidemia, unspecified: Secondary | ICD-10-CM

## 2015-03-29 DIAGNOSIS — Z5181 Encounter for therapeutic drug level monitoring: Secondary | ICD-10-CM | POA: Diagnosis not present

## 2015-03-29 DIAGNOSIS — I2510 Atherosclerotic heart disease of native coronary artery without angina pectoris: Secondary | ICD-10-CM

## 2015-03-29 DIAGNOSIS — Z79899 Other long term (current) drug therapy: Secondary | ICD-10-CM

## 2015-03-29 LAB — CBC
HCT: 39.6 % (ref 36.0–46.0)
HEMOGLOBIN: 13 g/dL (ref 12.0–15.0)
MCH: 28.3 pg (ref 26.0–34.0)
MCHC: 32.8 g/dL (ref 30.0–36.0)
MCV: 86.1 fL (ref 78.0–100.0)
MPV: 11.5 fL (ref 8.6–12.4)
Platelets: 335 10*3/uL (ref 150–400)
RBC: 4.6 MIL/uL (ref 3.87–5.11)
RDW: 15.4 % (ref 11.5–15.5)
WBC: 8.2 10*3/uL (ref 4.0–10.5)

## 2015-03-29 LAB — COMPREHENSIVE METABOLIC PANEL
ALBUMIN: 3.9 g/dL (ref 3.6–5.1)
ALT: 15 U/L (ref 6–29)
AST: 17 U/L (ref 10–35)
Alkaline Phosphatase: 77 U/L (ref 33–130)
BILIRUBIN TOTAL: 1 mg/dL (ref 0.2–1.2)
BUN: 25 mg/dL (ref 7–25)
CO2: 28 mmol/L (ref 20–31)
CREATININE: 1.25 mg/dL — AB (ref 0.50–0.99)
Calcium: 9.9 mg/dL (ref 8.6–10.4)
Chloride: 97 mmol/L — ABNORMAL LOW (ref 98–110)
Glucose, Bld: 133 mg/dL — ABNORMAL HIGH (ref 65–99)
Potassium: 3.3 mmol/L — ABNORMAL LOW (ref 3.5–5.3)
SODIUM: 138 mmol/L (ref 135–146)
TOTAL PROTEIN: 9.3 g/dL — AB (ref 6.1–8.1)

## 2015-03-29 LAB — DIGOXIN LEVEL: DIGOXIN LVL: 1 ug/L (ref 0.8–2.0)

## 2015-03-29 NOTE — Patient Instructions (Signed)
Medication Instructions:  None  Labwork: CBC, CMET, Digoxin level today  Testing/Procedures: Your physician has requested that you have an echocardiogram in earl January. Echocardiography is a painless test that uses sound waves to create images of your heart. It provides your doctor with information about the size and shape of your heart and how well your heart's chambers and valves are working. This procedure takes approximately one hour. There are no restrictions for this procedure.    Follow-Up: Your physician recommends that you schedule a follow-up appointment in: 6 weeks or next available with Dr. Eldridge DaceVaranasi.   Any Other Special Instructions Will Be Listed Below (If Applicable).     If you need a refill on your cardiac medications before your next appointment, please call your pharmacy.

## 2015-03-30 ENCOUNTER — Encounter: Payer: Self-pay | Admitting: Internal Medicine

## 2015-03-30 ENCOUNTER — Ambulatory Visit (INDEPENDENT_AMBULATORY_CARE_PROVIDER_SITE_OTHER): Payer: Medicaid Other | Admitting: Internal Medicine

## 2015-03-30 ENCOUNTER — Ambulatory Visit (INDEPENDENT_AMBULATORY_CARE_PROVIDER_SITE_OTHER)
Admission: RE | Admit: 2015-03-30 | Discharge: 2015-03-30 | Disposition: A | Discharge status: Home / Self Care | Payer: Medicaid Other | Source: Ambulatory Visit | Attending: Internal Medicine | Admitting: Internal Medicine

## 2015-03-30 ENCOUNTER — Telehealth: Payer: Self-pay | Admitting: Cardiology

## 2015-03-30 VITALS — BP 140/70 | HR 104 | Ht 60.0 in | Wt 90.2 lb

## 2015-03-30 DIAGNOSIS — R0600 Dyspnea, unspecified: Secondary | ICD-10-CM

## 2015-03-30 MED ORDER — FUROSEMIDE 20 MG PO TABS: 20.0000 mg | ORAL_TABLET | ORAL | Status: DC

## 2015-03-30 MED ORDER — POTASSIUM CHLORIDE ER 10 MEQ PO TBCR: 10.0000 meq | EXTENDED_RELEASE_TABLET | ORAL | Status: DC

## 2015-03-30 NOTE — Progress Notes (Signed)
Subjective:     Patient ID: Breanna Alvarez, female   DOB: 07-27-1950,  MRN: [id number]  HPI   11 yobm never smoker s/p cabg with prolonged vent > trach > snf   Admit date: 01/26/2015 Discharge date: 03/15/2015  Admission Diagnoses:  Patient Active Problem List   Diagnosis Date Noted  . ARDS (adult respiratory distress syndrome) (HCC)   . Aspiration into airway   . Acute on chronic systolic heart failure (HCC)   . Malnutrition of moderate degree (HCC) 02/17/2015  . Acute and chronic respiratory failure (acute-on-chronic) (HCC) 02/15/2015  . Failure to wean from mechanical ventilation (HCC) 02/15/2015  . Chest tube in place   . Acute MI, subendocardial (HCC)   . Atelectasis   . Congestive heart failure (CHF) (HCC)   . Acute respiratory failure (HCC)   . CAD (coronary artery disease) 02/05/2015  . 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 Coronary artery disease involving native coronary artery of native heart without angina pectoris   . Unstable angina (HCC) 01/04/2015  . Cardiomyopathy, ischemic 01/04/2015  . Essential hypertension 10/01/2014  . CAD, multiple vessel 07/14/2014  . Abnormal PFTs 07/14/2014  . Moderate mitral regurgitation 07/14/2014  . Hyperlipidemia LDL goal <70 07/14/2014  . Leukocytosis 07/14/2014  . Normocytic anemia 07/14/2014  . Chronic systolic heart failure (HCC)   . NSTEMI (non-ST elevated myocardial infarction) [facility])    Discharge Diagnoses:   Patient Active Problem List   Diagnosis Date Noted  . ARDS (adult respiratory distress syndrome) (HCC)   . Aspiration into airway   . Acute on chronic systolic heart failure (HCC)   . Malnutrition of moderate degree (HCC) 02/17/2015  . Acute and chronic respiratory failure (acute-on-chronic) (HCC) 02/15/2015  . Failure to wean from mechanical ventilation (HCC) 02/15/2015  . Chest tube in place   . Acute MI,  subendocardial (HCC)   . Atelectasis   . Congestive heart failure (CHF) (HCC)   . Acute respiratory failure (HCC)   . CAD (coronary artery disease) 02/05/2015  . 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 Coronary artery disease involving native coronary artery of native heart without angina pectoris   . Unstable angina (HCC) 01/04/2015  . Cardiomyopathy, ischemic 01/04/2015  . Essential hypertension 10/01/2014  . CAD, multiple vessel 07/14/2014  . Abnormal PFTs 07/14/2014  . Moderate mitral regurgitation 07/14/2014  . Hyperlipidemia LDL goal <70 07/14/2014  . Leukocytosis 07/14/2014  . Normocytic anemia 07/14/2014  . Chronic systolic heart failure (HCC)   . NSTEMI (non-ST elevated myocardial infarction) (HCC)          03/30/2015 1st [facility] office visit/ Mykal Kirchman   Chief Complaint  Patient presents with  . HFU     Pt was having problems with eating with trach cap on, but since she has had cap off she is doing better. She states that she does not know if she is SOB or not.   an can walk up to 7 m s stopping s 02 Sleeps on T collar/ 02 ok  Was on D 1 diet but apparently swallowing much better so not restricted per fm  No obvious day to day or daytime variability or assoc chronic cough or cp or chest tightness, subjective wheeze or overt sinus or hb symptoms. No unusual exp hx or h/o childhood pna/ asthma or knowledge of premature birth.  Sleeping ok without nocturnal  or early am exacerbation  of respiratory  c/o's or need for noct saba. Also denies any obvious fluctuation  of symptoms with weather or environmental changes or other aggravating or alleviating factors except as outlined above   Current Medications, Allergies, Complete Past Medical History, Past Surgical History, Family History, and Social History were reviewed in [facility] electronic medical record.  ROS  The following are not active complaints unless bolded sore  throat, dysphagia, dental problems, itching, sneezing,  nasal congestion or excess/ purulent secretions, ear ache,   fever, chills, sweats, unintended wt loss, classically pleuritic or exertional cp, hemoptysis,  orthopnea pnd or leg swelling, presyncope, palpitations, abdominal pain, anorexia, nausea, vomiting, diarrhea  or change in bowel or bladder habits, change in stools or urine, dysuria,hematuria,  rash, arthralgias, visual complaints, headache, numbness, weakness or ataxia or problems with walking or coordination,  change in mood/affect or memory.        Review of Systems     Objective:   Physical Exam amb bf nad can't tol trach plug at all or even pmv - able to climb on to exam table s problem RA  Wt Readings from Last 3 Encounters:  03/30/15 90 lb 3.2 oz (40.914 kg)  03/29/15 89 lb (40.37 kg)  03/15/15 104 lb 11.5 oz (47.5 kg)    Vital signs reviewed   HEENT: nl dentition, turbinates, and oropharynx. Nl external ear canals without cough reflex   NECK :  without JVD/Nodes/TM/ nl carotid upstrokes bilaterally   LUNGS: no acc muscle use, mostly transmitted upper airway rhonchi better  with trach plug out / much worse with occlusion or pmv in place    CV:  RRR  no s3 or murmur or increase in P2, no edema   ABD:  soft and nontender with nl excursion in the supine position. No bruits or organomegaly, bowel sounds nl  MS:  warm without deformities, calf tenderness, cyanosis or clubbing  SKIN: warm and dry without lesions    NEURO:  alert, approp, no deficits     CXR PA and Lateral:   03/30/2015 :    I personally reviewed images and agree with radiology impression as follows:    No active cardiopulmonary disease. Status post CABG. Tracheostomy tube in place.     Assessment:

## 2015-03-30 NOTE — Patient Instructions (Signed)
Please remember to go to the   x-ray department downstairs for your tests - we will call you with the results when they are available.  Keep appt with Dr Morton PetersVan Tright and if still not able to breath with the trach plugged he may want to take a look with a light at your throat or send you to an ENT doctor

## 2015-03-30 NOTE — Telephone Encounter (Signed)
Called patient's husband [name] back. Informed him of lab results. Per [name], KDUR 10 meq 2 tabs on Tuedsday, and Wed. Then 10 meq With lasix. Change lasix to every other day along with Kdur. Patient's husband verbalized understanding. Called [facility] to confirm fax and talked to patient's nurse to inform her of changes. Will fax order.

## 2015-03-30 NOTE — Telephone Encounter (Signed)
New message ° ° ° ° °Returning a call to the nurse to get lab results °

## 2015-03-31 NOTE — Progress Notes (Signed)
Quick Note:  lmtcb for pt. ______ 

## 2015-04-01 ENCOUNTER — Encounter: Payer: Self-pay | Admitting: Internal Medicine

## 2015-04-01 NOTE — Assessment & Plan Note (Addendum)
hosp record reviewed with pt and fm  I had an extended discussion with the patient reviewing all relevant studies completed to date and  lasting 15 to 20 minutes of a 25 minute visit    Her acute resp failure/ards pattern has resolved  Her symptoms appear to be related to glottic issues (or at least some point prox to trach) as she can breathe just fine with pmv out and her exam and cxr are clear.   rec  F/u with Dr Zenaida NieceVan Tright/ consider ENT eval before attempting to remove trach  No pulmonary f/u needed.

## 2015-04-02 ENCOUNTER — Other Ambulatory Visit (HOSPITAL_COMMUNITY): Payer: Self-pay | Admitting: Internal Medicine

## 2015-04-02 DIAGNOSIS — R1310 Dysphagia, unspecified: Secondary | ICD-10-CM

## 2015-04-06 ENCOUNTER — Encounter: Payer: Self-pay | Admitting: Interventional Cardiology

## 2015-04-06 ENCOUNTER — Ambulatory Visit (HOSPITAL_COMMUNITY)
Admission: RE | Admit: 2015-04-06 | Discharge: 2015-04-06 | Disposition: A | Discharge status: Home / Self Care | Payer: Medicaid Other | Source: Ambulatory Visit | Attending: Internal Medicine | Admitting: Internal Medicine

## 2015-04-06 ENCOUNTER — Ambulatory Visit (INDEPENDENT_AMBULATORY_CARE_PROVIDER_SITE_OTHER): Payer: Medicaid Other | Admitting: Interventional Cardiology

## 2015-04-06 ENCOUNTER — Telehealth: Payer: Self-pay | Admitting: Internal Medicine

## 2015-04-06 VITALS — BP 140/60 | HR 99 | Ht 60.0 in | Wt 92.8 lb

## 2015-04-06 DIAGNOSIS — R1310 Dysphagia, unspecified: Secondary | ICD-10-CM

## 2015-04-06 DIAGNOSIS — I5022 Chronic systolic (congestive) heart failure: Secondary | ICD-10-CM | POA: Diagnosis not present

## 2015-04-06 DIAGNOSIS — Z930 Tracheostomy status: Secondary | ICD-10-CM | POA: Insufficient documentation

## 2015-04-06 DIAGNOSIS — Z951 Presence of aortocoronary bypass graft: Secondary | ICD-10-CM | POA: Diagnosis not present

## 2015-04-06 DIAGNOSIS — I10 Essential (primary) hypertension: Secondary | ICD-10-CM

## 2015-04-06 DIAGNOSIS — I509 Heart failure, unspecified: Secondary | ICD-10-CM | POA: Diagnosis not present

## 2015-04-06 DIAGNOSIS — E785 Hyperlipidemia, unspecified: Secondary | ICD-10-CM

## 2015-04-06 DIAGNOSIS — I252 Old myocardial infarction: Secondary | ICD-10-CM | POA: Insufficient documentation

## 2015-04-06 DIAGNOSIS — I5021 Acute systolic (congestive) heart failure: Secondary | ICD-10-CM | POA: Diagnosis not present

## 2015-04-06 DIAGNOSIS — I110 Hypertensive heart disease with heart failure: Secondary | ICD-10-CM | POA: Diagnosis not present

## 2015-04-06 DIAGNOSIS — I429 Cardiomyopathy, unspecified: Secondary | ICD-10-CM | POA: Diagnosis not present

## 2015-04-06 DIAGNOSIS — I2510 Atherosclerotic heart disease of native coronary artery without angina pectoris: Secondary | ICD-10-CM | POA: Diagnosis not present

## 2015-04-06 DIAGNOSIS — J9690 Respiratory failure, unspecified, unspecified whether with hypoxia or hypercapnia: Secondary | ICD-10-CM | POA: Insufficient documentation

## 2015-04-06 DIAGNOSIS — R1313 Dysphagia, pharyngeal phase: Secondary | ICD-10-CM | POA: Diagnosis not present

## 2015-04-06 MED ORDER — METOPROLOL TARTRATE 25 MG PO TABS: 25.0000 mg | ORAL_TABLET | Freq: Every day | ORAL | Status: DC

## 2015-04-06 NOTE — Patient Instructions (Signed)
**Note De-Identified  Obfuscation** Medication Instructions:  Decrease Lopressor to 25 mg once daily  Labwork: BMET-to be done at facility   Testing/Procedures: None  Follow-Up: Your physician recommends that you schedule a follow-up appointment in: 3 months      If you need a refill on your cardiac medications before your next appointment, please call your pharmacy.

## 2015-04-06 NOTE — Progress Notes (Signed)
Patient ID: Breanna Alvarez, female   DOB: [birth year], 64 y.o.   MRN: [id number]     Cardiology Office Note   Date:  04/06/2015   ID:  Breanna Alvarez, DOB 25-Jan-1951, MRN [id number]  PCP:  No PCP Per Patient    No chief complaint on file. f/u CAD   Wt Readings from Last 3 Encounters:  04/06/15 92 lb 12.8 oz (42.094 kg)  03/30/15 90 lb 3.2 oz (40.914 kg)  03/29/15 89 lb (40.37 kg)       History of Present Illness: Breanna Alvarez is a 64 y.o. female  who ha she had a non-STEMI in early 2016. Cardiac cath showed, located bifurcation LAD disease. She had multivessel coronary artery disease.  She declined bypass surgery. She had progressive angina. In September 2016, she agreed to bypass surgery. She had a very complicated course, mostly due to LV dysfunction and respiratory issues including ARDS and aspiration. She is now in a rehabilitation facility. She had a trach placed.  Overall, she is getting stronger. She is in good spirits. She denies any chest discomfort. She recently passed a swallowing study. She graduated from her physical therapy. Her blood pressures are typically well controlled with systolics in the 342-876 range. She has not had any low blood pressure readings. Most recent labs reveal creatinine 1.2. She has not been on an ACE inhibitor despite her LV dysfunction.     Past Medical History  Diagnosis Date  . Hyperlipidemia   . Acute systolic CHF (congestive heart failure), NYHA class 3 ([place]) 07/10/2014  . Hypertension     Past Surgical History  Procedure Laterality Date  . No past surgeries    . Left heart catheterization with coronary angiogram N/A 07/13/2014    Procedure: LEFT HEART CATHETERIZATION WITH CORONARY ANGIOGRAM;  Surgeon: Jettie Booze, MD; mLAD 95%, dLAD 95%, D1 95%, CFX severe dz, mRCA 80%  . Coronary artery bypass graft N/A 02/01/2015    Procedure: CORONARY ARTERY BYPASS GRAFTING (CABG) X 4 UTILIZING THE LEFT INTERNAL MAMMARY ARTERY TO LAD,  ENDOSCOPICALLY HARVESTED BILATERAL SAPHENEOUS VEIN GRAFTS  TO DIAGONAL, OM AND PD.;  Surgeon: Ivin Poot, MD;  Location: [place];  Service: Open Heart Surgery;  Laterality: N/A;  . Tee without cardioversion N/A 02/01/2015    Procedure: TRANSESOPHAGEAL ECHOCARDIOGRAM (TEE);  Surgeon: Ivin Poot, MD;  Location: [place];  Service: Open Heart Surgery;  Laterality: N/A;  . Placement of centrimag ventricular assist device N/A 02/01/2015    Procedure: PLACEMENT OF CENTRIMAG VENTRICULAR ASSIST DEVICE;  Surgeon: Ivin Poot, MD;  Location: [place];  Service: Open Heart Surgery;  Laterality: N/A;  . Removal of centrimag ventricular assist device N/A 02/05/2015    Procedure: REMOVAL OF CENTRIMAG VENTRICULAR ASSIST DEVICE;  Surgeon: Ivin Poot, MD;  Location: [place];  Service: Open Heart Surgery;  Laterality: N/A;  . Cannulation for cardiopulmonary bypass N/A 02/05/2015    Procedure: CANNULATION FOR CARDIOPULMONARY BYPASS;  Surgeon: Ivin Poot, MD;  Location: [place];  Service: Open Heart Surgery;  Laterality: N/A;  . Tee without cardioversion N/A 02/05/2015    Procedure: TRANSESOPHAGEAL ECHOCARDIOGRAM (TEE);  Surgeon: Ivin Poot, MD;  Location: [place];  Service: Open Heart Surgery;  Laterality: N/A;  . Sternal closure N/A 02/08/2015    Procedure: STERNAL CLOSURE;  Surgeon: Ivin Poot, MD;  Location: [facility] OR;  Service: Thoracic;  Laterality: N/A;  . Tee without cardioversion N/A 02/08/2015    Procedure: TRANSESOPHAGEAL ECHOCARDIOGRAM (  TEE);  Surgeon: Ivin Poot, MD;  Location: [facility] OR;  Service: Thoracic;  Laterality: N/A;     Current Outpatient Prescriptions  Medication Sig Dispense Refill  . acetaminophen (TYLENOL) 650 MG CR tablet Take 650 mg by mouth every 8 (eight) hours as needed for pain.    Marland Kitchen albuterol (PROVENTIL) (2.5 MG/3ML) 0.083% nebulizer solution Take 3 mLs (2.5 mg total) by nebulization every 4 (four) hours as needed for wheezing or shortness of breath. 75 mL 12  . aspirin  EC 325 MG EC tablet Take 1 tablet (325 mg total) by mouth daily. 30 tablet 0  . chlorhexidine gluconate (PERIDEX) 0.12 % solution 15 mLs by Mouth Rinse route 2 (two) times daily. 120 mL 0  . clonazePAM (KLONOPIN) 0.5 MG tablet Take 0.5 mg by mouth daily.    . clonazePAM (KLONOPIN) 1 MG tablet Take 1 mg by mouth at bedtime.    . Digoxin 62.5 MCG TABS Take 0.0625 mg by mouth daily.    Marland Kitchen enoxaparin (LOVENOX) 30 MG/0.3ML injection Inject 0.3 mLs (30 mg total) into the skin daily. 0 Syringe   . furosemide (LASIX) 20 MG tablet Take 1 tablet (20 mg total) by mouth every other day. 30 tablet   . hydrALAZINE (APRESOLINE) 10 MG tablet Take 1 tablet (10 mg total) by mouth every 8 (eight) hours.    . Hydrocortisone (GERHARDT'S BUTT CREAM) CREA Apply 1 application topically 4 (four) times daily as needed for irritation.    . lactobacillus acidophilus (BACID) TABS tablet Take 2 tablets by mouth 2 (two) times daily.    . magic mouthwash SOLN Take 10 mLs by mouth 3 (three) times daily as needed for mouth pain.  0  . Maltodextrin-Xanthan Gum (RESOURCE THICKENUP CLEAR) POWD All liquids should be nectar thick    . metoprolol tartrate (LOPRESSOR) 25 MG tablet Take 1 tablet (25 mg total) by mouth 2 (two) times daily.    . Nutritional Supplements (FEEDING SUPPLEMENT, JEVITY 1.2 CAL,) LIQD Place 1,000 mLs into feeding tube continuous. Currently at 53m/hr  0  . ondansetron (ZOFRAN) 4 MG/2ML SOLN injection Inject 2 mLs (4 mg total) into the vein every 6 (six) hours as needed for nausea or vomiting. 2 mL 0  . pantoprazole (PROTONIX) 40 MG tablet Take 40 mg by mouth daily.    . pravastatin (PRAVACHOL) 80 MG tablet Take 1 tablet (80 mg total) by mouth every evening. (Patient taking differently: Take 40 mg by mouth every evening. ) 30 tablet 3  . QUEtiapine (SEROQUEL) 100 MG tablet Take 100 mg by mouth at bedtime.    .Marland Kitchenscopolamine (TRANSDERM-SCOP) 1 MG/3DAYS Place 1 patch (1.5 mg total) onto the skin every 3 (three) days. 10  patch 12   No current facility-administered medications for this visit.    Allergies:   Atorvastatin and Crestor    Social History:  The patient  reports that she has never smoked. She has never used smokeless tobacco. She reports that she does not drink alcohol or use illicit drugs.   Family History:  The patient's family history includes Heart attack in her mother; Hypertension in her father and mother; Stroke in her father.    ROS:  Please see the history of present illness.   Otherwise, review of systems are positive for respiratory issues as noted above.   All other systems are reviewed and negative.    PHYSICAL EXAM: VS:  BP 140/60 mmHg  Pulse 99  Ht 5' (1.524 m)  Wt 92  lb 12.8 oz (42.094 kg)  BMI 18.12 kg/m2 , BMI Body mass index is 18.12 kg/(m^2). GEN: Well nourished, well developed, in no acute distress HEENT: tracheostomy  Neck: no JVD, carotid bruits, or masses Cardiac: RRR; no murmurs, rubs, or gallops,no edema  Respiratory:Coarse breath sounds to auscultation bilaterally, normal work of breathing GI: soft, nontender, nondistended, + BS MS: no deformity or atrophy Skin: warm and dry, no rash Neuro:  Strength and sensation are intact Psych: euthymic mood, full affect     Recent Labs: 07/10/2014: B Natriuretic Peptide 1059.1* 07/14/2014: TSH 3.572 03/08/2015: Magnesium 2.2 03/29/2015: ALT 15; BUN 25; Creat 1.25*; Hemoglobin 13.0; Platelets 335; Potassium 3.3*; Sodium 138   Lipid Panel    Component Value Date/Time   CHOL 223* 01/27/2015 0310   TRIG 171* 02/22/2015 0340   HDL 25* 01/27/2015 0310   CHOLHDL 8.9 01/27/2015 0310   VLDL 19 01/27/2015 0310   LDLCALC 179* 01/27/2015 0310     Other studies Reviewed: Additional studies/ records that were reviewed today with results demonstrating: Hospital discharge summary with findings noted above.   ASSESSMENT AND PLAN:  1. Ischemic cardiomyopathy/coronary artery disease/chronic systolic heart failure: Ejection  fraction 25-30%. Status post bypass surgery. No angina. Will add losartan 25 mg daily. Check be met in one week. Continue beta blocker. Continue hydralazine.  2. Tracheostomy: They will be following up with ENT. They already follow-up with pulmonary. 3. Hyperlipidemia: She is tolerating pravastatin 80 mg daily. She was intolerant of atorvastatin and Crestor. Ideally, LDL would be around 70. Given her LDL of 179 in September, it is unlikely that pravastatin will get her all the way down to target. 4. Hypertension: Hopefully discharged and will help with blood pressure. We'll have to follow renal function closely.    Current medicines are reviewed at length with the patient today.  The patient concerns regarding her medicines were addressed.  The following changes have been made:  add losartan   Labs/ tests ordered today include: BMet in a week  No orders of the defined types were placed in this encounter.    Recommend 150 minutes/week of aerobic exercise Low fat, low carb, high fiber diet recommended  Disposition:   FU in 3 months   Teresita Madura., MD  04/06/2015 1:29 PM    [facility] [place], [place], [place]  [address] Phone: [phone]; Fax: [phone]

## 2015-04-06 NOTE — Telephone Encounter (Addendum)
[facility]. Marchelle Folksmanda performed pt's modified barium swallow and stated she did well, however, she has little access to upper airway and is requested a STAT referral to ENT along with a referral to the trach clinic because pt still has a cuffed trach.   Dr. Sherene SiresWert please advise. Thanks.   Message closed in error.

## 2015-04-06 NOTE — Telephone Encounter (Signed)
I thought the cuff was down so it shouldn't really matter but this was T surgery's trach, not ours, so she needs to keep appt with T surgery and let them handle the ent eval prn

## 2015-04-08 ENCOUNTER — Telehealth: Payer: Self-pay | Admitting: Internal Medicine

## 2015-04-08 NOTE — Telephone Encounter (Signed)
Per 11/29 phone note: Nyoka CowdenMichael B Wert, MD at 04/06/2015 12:32 PM     Status: Signed       Expand All Collapse All   I thought the cuff was down so it shouldn't really matter but this was T surgery's trach, not ours, so she needs to keep appt with T surgery and let them handle the ent eval prn             Nicanor AlconElise K Nagle, RN at 04/06/2015 12:23 PM     Status: Addendum       Expand All Collapse All   [facility]. Marchelle Folksmanda performed pt's modified barium swallow and stated she did well, however, she has little access to upper airway and is requested a STAT referral to ENT along with a referral to the trach clinic because pt still has a cuffed trach.   Dr. Sherene SiresWert please advise. Thanks.   Message closed in error.       ---  Called spoke with pt spouse and made aware of the above. Pt has an appt with Dr. Donata ClayVan Trigt next week. He will give that office a call about the trach as MW stated above. Nothing further needed

## 2015-04-09 ENCOUNTER — Encounter: Payer: Self-pay | Admitting: *Deleted

## 2015-04-09 NOTE — Progress Notes (Signed)
Patient ID: Breanna QuarryMaria T Esteve, female   DOB: Nov 09, 1950, 64 y.o.   MRN: [id number] An urgent referral has been made to [facility] to evaluate her inability to tolerate her PMV after the results of her swallowing test by speech pathology and their recommendations. This was made per Dr. Zenaida NieceVan Trigt's instruction.

## 2015-04-12 NOTE — Addendum Note (Signed)
Addended by: Micki RileySHOFFNER, Bevelyn Arriola C on: 04/12/2015 02:40 PM   Modules accepted: Orders

## 2015-04-13 ENCOUNTER — Other Ambulatory Visit (HOSPITAL_COMMUNITY): Payer: Self-pay | Admitting: Otolaryngology

## 2015-04-13 ENCOUNTER — Other Ambulatory Visit: Payer: Self-pay | Admitting: *Deleted

## 2015-04-13 DIAGNOSIS — Z951 Presence of aortocoronary bypass graft: Secondary | ICD-10-CM

## 2015-04-13 NOTE — H&P (Signed)
04/20/2015 7:12 AM  Breanna Alvarez  PREOPERATIVE HISTORY AND PHYSICAL  CHIEF COMPLAINT: tracheostomy dependence, dysphagia  HISTORY: This is a 64 year old who presents with tracheostomy dependence, dysphagia. She had tracheostomy back in 02/2015 after a cardiac procedure and has been unable to tolerate her Passy Muir valve, and attempted removal of the trach in the office was difficult, so she now presents for direct laryngoscopy with bronchoscopy, trach change, possible decannulation.  Dr. Emeline Darling, [place] has discussed the risks (bleeding, anesthesia risks, airway problems, death, need for additional procedures, etc.) , benefits, and alternatives of this procedure. The patient understands the risks and would like to proceed with the procedure. The chances of success of the procedure are >25% and the patient understands this. I personally performed an examination of the patient within 24 hours of the procedure.  PAST MEDICAL HISTORY: Past Medical History  Diagnosis Date  . Hyperlipidemia   . Acute systolic CHF (congestive heart failure), NYHA class 3 ([facility]) 07/10/2014  . Hypertension     PAST SURGICAL HISTORY: Past Surgical History  Procedure Laterality Date  . No past surgeries    . Left heart catheterization with coronary angiogram N/A 07/13/2014    Procedure: LEFT HEART CATHETERIZATION WITH CORONARY ANGIOGRAM;  Surgeon: Corky Crafts, MD; mLAD 95%, dLAD 95%, D1 95%, CFX severe dz, mRCA 80%  . Coronary artery bypass graft N/A 02/01/2015    Procedure: CORONARY ARTERY BYPASS GRAFTING (CABG) X 4 UTILIZING THE LEFT INTERNAL MAMMARY ARTERY TO LAD, ENDOSCOPICALLY HARVESTED BILATERAL SAPHENEOUS VEIN GRAFTS  TO DIAGONAL, OM AND PD.;  Surgeon: Kerin Perna, MD;  Location: [facility] OR;  Service: Open Heart Surgery;  Laterality: N/A;  . Tee without cardioversion N/A 02/01/2015    Procedure: TRANSESOPHAGEAL ECHOCARDIOGRAM (TEE);  Surgeon: Kerin Perna, MD;  Location: [facility] OR;  Service: Open Heart  Surgery;  Laterality: N/A;  . Placement of centrimag ventricular assist device N/A 02/01/2015    Procedure: PLACEMENT OF CENTRIMAG VENTRICULAR ASSIST DEVICE;  Surgeon: Kerin Perna, MD;  Location: [facility] OR;  Service: Open Heart Surgery;  Laterality: N/A;  . Removal of centrimag ventricular assist device N/A 02/05/2015    Procedure: REMOVAL OF CENTRIMAG VENTRICULAR ASSIST DEVICE;  Surgeon: Kerin Perna, MD;  [facility] OR;  Service: Open Heart Surgery;  Laterality: N/A;  . Cannulation for cardiopulmonary bypass N/A 02/05/2015    Procedure: CANNULATION FOR CARDIOPULMONARY BYPASS;  Surgeon: Kerin Perna, MD;  Location: [facility] OR;  Service: Open Heart Surgery;  Laterality: N/A;  . Tee without cardioversion N/A 02/05/2015    Procedure: TRANSESOPHAGEAL ECHOCARDIOGRAM (TEE);  Surgeon: Kerin Perna, MD;  Location: [facility] OR;  Service: Open Heart Surgery;  Laterality: N/A;  . Sternal closure N/A 02/08/2015    Procedure: STERNAL CLOSURE;  Surgeon: Kerin Perna, MD;  Location: [facility] OR;  Service: Thoracic;  Laterality: N/A;  . Tee without cardioversion N/A 02/08/2015    Procedure: TRANSESOPHAGEAL ECHOCARDIOGRAM (TEE);  Surgeon: Kerin Perna, MD;  Location[facility] OR;  Service: Thoracic;  Laterality: N/A;    MEDICATIONS: No current facility-administered medications on file prior to encounter.   Current Outpatient Prescriptions on File Prior to Encounter  Medication Sig Dispense Refill  . acetaminophen (TYLENOL) 650 MG CR tablet Take 650 mg by mouth every 8 (eight) hours as needed for pain.    Marland Kitchen albuterol (PROVENTIL) (2.5 MG/3ML) 0.083% nebulizer solution Take 3 mLs (2.5 mg total) by nebulization every 4 (four) hours as needed for wheezing or shortness of breath. 75 mL  12  . aspirin EC 325 MG EC tablet Take 1 tablet (325 mg total) by mouth daily. 30 tablet 0  . chlorhexidine gluconate (PERIDEX) 0.12 % solution 15 mLs by Mouth Rinse route 2 (two) times daily. 120 mL 0  . clonazePAM (KLONOPIN) 0.5 MG tablet  Take 0.5 mg by mouth daily.    . clonazePAM (KLONOPIN) 1 MG tablet Take 1 mg by mouth at bedtime.    . Digoxin 62.5 MCG TABS Take 0.0625 mg by mouth daily.    Marland Kitchen. enoxaparin (LOVENOX) 30 MG/0.3ML injection Inject 0.3 mLs (30 mg total) into the skin daily. 0 Syringe   . furosemide (LASIX) 20 MG tablet Take 1 tablet (20 mg total) by mouth every other day. 30 tablet   . hydrALAZINE (APRESOLINE) 10 MG tablet Take 1 tablet (10 mg total) by mouth every 8 (eight) hours.    . Hydrocortisone (GERHARDT'S BUTT CREAM) CREA Apply 1 application topically 4 (four) times daily as needed for irritation.    . lactobacillus acidophilus (BACID) TABS tablet Take 2 tablets by mouth 2 (two) times daily.    . magic mouthwash SOLN Take 10 mLs by mouth 3 (three) times daily as needed for mouth pain.  0  . Maltodextrin-Xanthan Gum (RESOURCE THICKENUP CLEAR) POWD All liquids should be nectar thick    . metoprolol tartrate (LOPRESSOR) 25 MG tablet Take 1 tablet (25 mg total) by mouth daily. 30 tablet 3  . Nutritional Supplements (FEEDING SUPPLEMENT, JEVITY 1.2 CAL,) LIQD Place 1,000 mLs into feeding tube continuous. Currently at 10850ml/hr  0  . ondansetron (ZOFRAN) 4 MG/2ML SOLN injection Inject 2 mLs (4 mg total) into the vein every 6 (six) hours as needed for nausea or vomiting. 2 mL 0  . pantoprazole (PROTONIX) 40 MG tablet Take 40 mg by mouth daily.    . pravastatin (PRAVACHOL) 80 MG tablet Take 1 tablet (80 mg total) by mouth every evening. (Patient taking differently: Take 40 mg by mouth every evening. ) 30 tablet 3  . QUEtiapine (SEROQUEL) 100 MG tablet Take 100 mg by mouth at bedtime.    Marland Kitchen. scopolamine (TRANSDERM-SCOP) 1 MG/3DAYS Place 1 patch (1.5 mg total) onto the skin every 3 (three) days. 10 patch 12     ALLERGIES: Allergies  Allergen Reactions  . Atorvastatin Other (See Comments)    Gas, chest tightness  . Crestor [Rosuvastatin Calcium] Other (See Comments)    Muscle aches      SOCIAL HISTORY: Social  History   Social History  . Marital Status: Married    Spouse Name: N/A  . Number of Children: N/A  . Years of Education: N/A   Occupational History  . Not on file.   Social History Main Topics  . Smoking status: Never Smoker   . Smokeless tobacco: Never Used  . Alcohol Use: No  . Drug Use: No  . Sexual Activity: Not on file   Other Topics Concern  . Not on file   Social History Narrative    FAMILY HISTORY:  Family History  Problem Relation Age of Onset  . Heart attack Mother     MI when she was 60-70s, died at age 63  . Stroke Father     died when patient was 89 years old  . Hypertension Mother   . Hypertension Father     REVIEW OF SYSTEMS:  HEENT: dysphagia, tracheostomy dependence, otherwise negative x 12 systems except per HPI  PHYSICAL EXAM:  GENERAL:  NAD VITAL SIGNS:  Ceasar MonsFiled  Vitals:   04/20/15 0614  BP: 135/58  Pulse: 94  Temp: 97.4 F (36.3 C)  Resp: 16     SKIN:  Warm, dry HEENT:  Flexible laryngoscopy in office showed patent nasal cavity and normal epiglottis, true vocal folds, and arytenoids with tracheostomy visible in the subglottic trachea. NECK:  Supple with 6 cuffed tracheostomy tube with cuff deflated. LYMPH:  No LAD palpated ABDOMEN:  soft MUSCULOSKELETAL: normal strength PSYCH:  Normal affect NEUROLOGIC:  CN 2-12 grossly intact and symmetric  ASSESSMENT AND PLAN: Plan to proceed with direct laryngoscopy, bronchoscopy, and tracheostomy tube change vs. decannulation. Patient understands the risks, benefits, and alternatives. Informed written consent signed, witnessed, and on chart. 12/13/2016Gore, Lennis Korb 7:13 AM

## 2015-04-14 ENCOUNTER — Ambulatory Visit
Admission: RE | Admit: 2015-04-14 | Discharge: 2015-04-14 | Disposition: A | Discharge status: Home / Self Care | Payer: Medicaid Other | Source: Ambulatory Visit | Attending: Cardiothoracic Surgery | Admitting: Cardiothoracic Surgery

## 2015-04-14 ENCOUNTER — Encounter: Payer: Self-pay | Admitting: *Deleted

## 2015-04-14 ENCOUNTER — Encounter: Payer: Self-pay | Admitting: Cardiothoracic Surgery

## 2015-04-14 ENCOUNTER — Ambulatory Visit (INDEPENDENT_AMBULATORY_CARE_PROVIDER_SITE_OTHER): Payer: Self-pay | Admitting: Cardiothoracic Surgery

## 2015-04-14 VITALS — BP 126/68 | HR 103 | Resp 20 | Ht 60.0 in | Wt 92.0 lb

## 2015-04-14 DIAGNOSIS — J80 Acute respiratory distress syndrome: Secondary | ICD-10-CM

## 2015-04-14 DIAGNOSIS — Z951 Presence of aortocoronary bypass graft: Secondary | ICD-10-CM

## 2015-04-14 DIAGNOSIS — I2510 Atherosclerotic heart disease of native coronary artery without angina pectoris: Secondary | ICD-10-CM

## 2015-04-14 NOTE — Progress Notes (Signed)
PCP is No PCP Per Patient Referring Provider is No ref. provider found  Chief Complaint  Patient presents with  . Routine Post Op    s/p CABG X 5 02/02/15 with a cxr    HPI: followup after urgent CABG x5 with a percutaneous LVAD   For ischemic cardiomyopathy and low ejection fraction  Following surgery patient had ARDS and required percutaneous tracheostomy by critical care service. She is now completing rehabbing at the [facility]. She is ambulatory in without difficulty. She is tolerating regular diet. The tracheostomy-#6 Shiley, has incorporated into her neck and is not removable. Dr. Emeline Darling will be revising her trache[facility].  Prior to discharge the hospital the patient had echocardiogram showing EF improved to 55%. She currently has no symptoms of angina or CHF. Chest x-ray shows clear lung fields, no pleural effusion, cardiac silhouette stable.  She has maintained a sinus rhythm. She reports she is scheduled to go home from the [facility] facility tomorrow.  Past Medical History  Diagnosis Date  . Hyperlipidemia   . Acute systolic CHF (congestive heart failure), NYHA class 3 (HCC) 07/10/2014  . Hypertension     Past Surgical History  Procedure Laterality Date  . No past surgeries    . Left heart catheterization with coronary angiogram N/A 07/13/2014    Procedure: LEFT HEART CATHETERIZATION WITH CORONARY ANGIOGRAM;  Surgeon: Corky Crafts, MD; mLAD 95%, dLAD 95%, D1 95%, CFX severe dz, mRCA 80%  . Coronary artery bypass graft N/A 02/01/2015    Procedure: CORONARY ARTERY BYPASS GRAFTING (CABG) X 4 UTILIZING THE LEFT INTERNAL MAMMARY ARTERY TO LAD, ENDOSCOPICALLY HARVESTED BILATERAL SAPHENEOUS VEIN GRAFTS  TO DIAGONAL, OM AND PD.;  Surgeon: Kerin Perna, MD;  Location: [facility] OR;  Service: Open Heart Surgery;  Laterality: N/A;  . Tee without cardioversion N/A 02/01/2015    Procedure: TRANSESOPHAGEAL  ECHOCARDIOGRAM (TEE);  Surgeon: Kerin Perna, MD;  Location: [facility] OR;  Service: Open Heart Surgery;  Laterality: N/A;  . Placement of centrimag ventricular assist device N/A 02/01/2015    Procedure: PLACEMENT OF CENTRIMAG VENTRICULAR ASSIST DEVICE;  Surgeon: Kerin Perna, MD;  Location: [facility] OR;  Service: Open Heart Surgery;  Laterality: N/A;  . Removal of centrimag ventricular assist device N/A 02/05/2015    Procedure: REMOVAL OF CENTRIMAG VENTRICULAR ASSIST DEVICE;  Surgeon: Kerin Perna, MD;  Location: [facility] OR;  Service: Open Heart Surgery;  Laterality: N/A;  . Cannulation for cardiopulmonary bypass N/A 02/05/2015    Procedure: CANNULATION FOR CARDIOPULMONARY BYPASS;  Surgeon: Kerin Perna, MD;  Location: [facility] OR;  Service: Open Heart Surgery;  Laterality: N/A;  . Tee without cardioversion N/A 02/05/2015    Procedure: TRANSESOPHAGEAL ECHOCARDIOGRAM (TEE);  Surgeon: Kerin Perna, MD;  Location: [facility] OR;  Service: Open Heart Surgery;  Laterality: N/A;  . Sternal closure N/A 02/08/2015    Procedure: STERNAL CLOSURE;  Surgeon: Kerin Perna, MD;  Location: [facility];  Service: Thoracic;  Laterality: N/A;  . Tee without cardioversion N/A 02/08/2015    Procedure: TRANSESOPHAGEAL ECHOCARDIOGRAM (TEE);  Surgeon: Kerin Perna, MD;  Location: [facility] OR;  Service: Thoracic;  Laterality: N/A;    Family History  Problem Relation Age of Onset  . Heart attack Mother     MI when she was 60-70s, died at age 57  . Stroke Father     died when patient was 57 years old  . Hypertension Mother   .  Hypertension Father     Social History Social History  Substance Use Topics  . Smoking status: Never Smoker   . Smokeless tobacco: Never Used  . Alcohol Use: No    Current Outpatient Prescriptions  Medication Sig Dispense Refill  . acetaminophen (TYLENOL) 650 MG CR tablet Take 650 mg by mouth every 8 (eight) hours as needed for pain.    Marland Kitchen albuterol (PROVENTIL) (2.5 MG/3ML) 0.083% nebulizer solution Take 3 mLs  (2.5 mg total) by nebulization every 4 (four) hours as needed for wheezing or shortness of breath. 75 mL 12  . aspirin EC 325 MG EC tablet Take 1 tablet (325 mg total) by mouth daily. 30 tablet 0  . chlorhexidine gluconate (PERIDEX) 0.12 % solution 15 mLs by Mouth Rinse route 2 (two) times daily. 120 mL 0  . clonazePAM (KLONOPIN) 0.5 MG tablet Take 0.5 mg by mouth daily.    . clonazePAM (KLONOPIN) 1 MG tablet Take 1 mg by mouth at bedtime.    . Digoxin 62.5 MCG TABS Take 0.0625 mg by mouth daily.    Marland Kitchen enoxaparin (LOVENOX) 30 MG/0.3ML injection Inject 0.3 mLs (30 mg total) into the skin daily. 0 Syringe   . furosemide (LASIX) 20 MG tablet Take 1 tablet (20 mg total) by mouth every other day. 30 tablet   . hydrALAZINE (APRESOLINE) 10 MG tablet Take 1 tablet (10 mg total) by mouth every 8 (eight) hours.    . Hydrocortisone (GERHARDT'S BUTT CREAM) CREA Apply 1 application topically 4 (four) times daily as needed for irritation.    . lactobacillus acidophilus (BACID) TABS tablet Take 2 tablets by mouth 2 (two) times daily.    . magic mouthwash SOLN Take 10 mLs by mouth 3 (three) times daily as needed for mouth pain.  0  . Maltodextrin-Xanthan Gum (RESOURCE THICKENUP CLEAR) POWD All liquids should be nectar thick    . metoprolol tartrate (LOPRESSOR) 25 MG tablet Take 1 tablet (25 mg total) by mouth daily. 30 tablet 3  . Nutritional Supplements (FEEDING SUPPLEMENT, JEVITY 1.2 CAL,) LIQD Place 1,000 mLs into feeding tube continuous. Currently at 7ml/hr  0  . ondansetron (ZOFRAN) 4 MG/2ML SOLN injection Inject 2 mLs (4 mg total) into the vein every 6 (six) hours as needed for nausea or vomiting. 2 mL 0  . pantoprazole (PROTONIX) 40 MG tablet Take 40 mg by mouth daily.    . pravastatin (PRAVACHOL) 80 MG tablet Take 1 tablet (80 mg total) by mouth every evening. (Patient taking differently: Take 40 mg by mouth every evening. ) 30 tablet 3  . QUEtiapine (SEROQUEL) 100 MG tablet Take 100 mg by mouth at  bedtime.    Marland Kitchen scopolamine (TRANSDERM-SCOP) 1 MG/3DAYS Place 1 patch (1.5 mg total) onto the skin every 3 (three) days. 10 patch 12   No current facility-administered medications for this visit.    Allergies  Allergen Reactions  . Atorvastatin Other (See Comments)    Gas, chest tightness  . Crestor [Rosuvastatin Calcium] Other (See Comments)    Muscle aches    Review of Systems  No fever Gaining weight Surgical incisions are healing well No peripheral edema No headache dizziness or syncope  BP 126/68 mmHg  Pulse 103  Resp 20  Ht 5' (1.524 m)  Wt 92 lb (41.731 kg)  BMI 17.97 kg/m2  SpO2 98% Physical Exam Alert responsive and appropriate Walks and hallway without difficulty Lungs clear Sternum stable well-healed Heart rhythm regular without murmur or gallop Leg incision well-healed without  edema Neuro intact Tracheostomy with minimal secretions but not mobile  Diagnostic Tests: Chest x-ray clear  Impression: Excellent recovery after CABG with combined temporary LVAD for ischemic cardiomyopathy. Now with normal LV function. Tracheostomy plan to be revised-removed later this month, December 13 at [facility] by Dr. Emeline DarlingGore Plan: Return for followup in one month to monitor progress She'll need to maintain her metoprolol, aspirin, and pravastatin. She'll be arranged for followup in the tracheostomy clinic if her tracheostomy is not closed by Dr. Emeline DarlingGore.  Mikey BussingPeter Van Trigt III, MD [facility] [phone]

## 2015-04-16 ENCOUNTER — Encounter (HOSPITAL_COMMUNITY): Payer: Self-pay

## 2015-04-16 ENCOUNTER — Telehealth: Payer: Self-pay | Admitting: Interventional Cardiology

## 2015-04-16 ENCOUNTER — Other Ambulatory Visit (HOSPITAL_COMMUNITY): Payer: Self-pay | Admitting: *Deleted

## 2015-04-16 ENCOUNTER — Encounter (HOSPITAL_COMMUNITY)
Admission: RE | Admit: 2015-04-16 | Discharge: 2015-04-16 | Disposition: A | Discharge status: Home / Self Care | Payer: Medicaid Other | Source: Ambulatory Visit | Attending: Otolaryngology | Admitting: Otolaryngology

## 2015-04-16 DIAGNOSIS — Z930 Tracheostomy status: Secondary | ICD-10-CM | POA: Diagnosis not present

## 2015-04-16 DIAGNOSIS — I110 Hypertensive heart disease with heart failure: Secondary | ICD-10-CM | POA: Diagnosis not present

## 2015-04-16 DIAGNOSIS — I2510 Atherosclerotic heart disease of native coronary artery without angina pectoris: Secondary | ICD-10-CM | POA: Insufficient documentation

## 2015-04-16 DIAGNOSIS — I255 Ischemic cardiomyopathy: Secondary | ICD-10-CM | POA: Diagnosis not present

## 2015-04-16 DIAGNOSIS — Z01812 Encounter for preprocedural laboratory examination: Secondary | ICD-10-CM | POA: Diagnosis not present

## 2015-04-16 DIAGNOSIS — Z951 Presence of aortocoronary bypass graft: Secondary | ICD-10-CM | POA: Diagnosis not present

## 2015-04-16 DIAGNOSIS — Z01818 Encounter for other preprocedural examination: Secondary | ICD-10-CM | POA: Insufficient documentation

## 2015-04-16 DIAGNOSIS — I5020 Unspecified systolic (congestive) heart failure: Secondary | ICD-10-CM | POA: Insufficient documentation

## 2015-04-16 DIAGNOSIS — Z79899 Other long term (current) drug therapy: Secondary | ICD-10-CM | POA: Insufficient documentation

## 2015-04-16 DIAGNOSIS — E785 Hyperlipidemia, unspecified: Secondary | ICD-10-CM | POA: Diagnosis not present

## 2015-04-16 DIAGNOSIS — R1310 Dysphagia, unspecified: Secondary | ICD-10-CM | POA: Insufficient documentation

## 2015-04-16 DIAGNOSIS — I252 Old myocardial infarction: Secondary | ICD-10-CM | POA: Diagnosis not present

## 2015-04-16 DIAGNOSIS — Z7982 Long term (current) use of aspirin: Secondary | ICD-10-CM | POA: Insufficient documentation

## 2015-04-16 HISTORY — DX: Personal history of other medical treatment: Z92.89

## 2015-04-16 HISTORY — DX: Gastrostomy status: Z93.1

## 2015-04-16 HISTORY — DX: Atherosclerotic heart disease of native coronary artery without angina pectoris: I25.10

## 2015-04-16 HISTORY — DX: Tracheostomy status: Z93.0

## 2015-04-16 LAB — BASIC METABOLIC PANEL
Anion gap: 11 (ref 5–15)
BUN: 12 mg/dL (ref 6–20)
CALCIUM: 9.2 mg/dL (ref 8.9–10.3)
CO2: 23 mmol/L (ref 22–32)
CREATININE: 0.92 mg/dL (ref 0.44–1.00)
Chloride: 105 mmol/L (ref 101–111)
GFR calc Af Amer: >60 mL/min (ref >60)
GLUCOSE: 84 mg/dL (ref 65–99)
POTASSIUM: 3.9 mmol/L (ref 3.5–5.1)
Sodium: 139 mmol/L (ref 135–145)

## 2015-04-16 LAB — CBC
HEMATOCRIT: 34.2 % — AB (ref 36.0–46.0)
Hemoglobin: 10.7 g/dL — ABNORMAL LOW (ref 12.0–15.0)
MCH: 28.1 pg (ref 26.0–34.0)
MCHC: 31.3 g/dL (ref 30.0–36.0)
MCV: 89.8 fL (ref 78.0–100.0)
PLATELETS: 336 10*3/uL (ref 150–400)
RBC: 3.81 MIL/uL — ABNORMAL LOW (ref 3.87–5.11)
RDW: 14.5 % (ref 11.5–15.5)
WBC: 8.5 10*3/uL (ref 4.0–10.5)

## 2015-04-16 NOTE — Progress Notes (Signed)
Pt was just discharged [facility] yesterday[facility] Pt and husband [facility] but stating that the nursing facility was supposed to call in 4 prescriptions for her. Herbert SetaHeather, pharmacy tech called [facility] on [address] and they do not have any prescriptions for pt. I called [facility] and spoke with DON and she states that unfortunately they were not faxed. I asked her what the Rx's are so I would know what she should be taking, she stated she would call me back. Another nurse called back and started going thru the [facility]'s [facility] and I asked if that was [facility] and she said yes. Pt and husband stated that she wasn't supposed to be on all of those. I then called Dr. Zenaida NieceVan Trigt's office and spoke with Alycia Rossettiyan and she found in Dr. Zenaida NieceVan Trigt's last note from 04/14/15 that he wanted pt to be on Metoprolol, Pravastatin and Aspirin 325mg . I then called Dr. Hoyle BarrVaranasi's office and spoke with Casimiro NeedleMichael, [place] for Dr. Eldridge DaceVaranasi and after some searching thru notes said that pt should be on Losartan, Hydralazine, Digoxin, Metoprolol and Aspirin. I have told the pt and her husband that they need to pick up at least these 6 prescriptions and use them. I also strongly suggested that pt get a PCP to help with all medications.  Pt denies any chest pain since her surgery. She is eating solid foods, able to talk around trach and hopes to have trach removed.   Echo - 02/20/15 in EPIC. EKG - 03/29/15 in EPIC. Cath - 07/13/14 in EPIC.

## 2015-04-16 NOTE — Telephone Encounter (Signed)
New message      Need to know if pt is to be on digoxin.  She is having surgery next tues.

## 2015-04-16 NOTE — Telephone Encounter (Signed)
Returned call.  Was asked what heart meds this patient is on as far as our office is concerned.  Last note of Dr Eldridge DaceVaranasi from 04-06-15 office note says 1) Aspirin 325mg  daily  2)

## 2015-04-16 NOTE — Pre-Procedure Instructions (Addendum)
Salli QuarryMaria T Scaff  04/16/2015      Your procedure is scheduled on Tuesday, April 20, 2015 at 7:30 AM.   [facility] Admitting Office at 5:30 AM.   Call this number if you have problems the morning of surgery: [phone]   Any questions prior to day of surgery, please call [phone] between 8 & 4 PM.   Remember:  Do not eat food or drink liquids after midnight Monday, 04/20/15.  Take these medicines the morning of surgery with A SIP OF WATER: Digoxin,  Metoprolol (Lopressor), Hydralazine    Do not wear jewelry, make-up or nail polish.  Do not wear lotions, powders, or perfumes.  You may wear deodorant.  Do not shave 48 hours prior to surgery.    Do not bring valuables to the hospital.  [facility] is not responsible for any belongings or valuables.  Contacts, dentures or bridgework may not be worn into surgery.  Leave your suitcase in the car.  After surgery it may be brought to your room.  For patients admitted to the hospital, discharge time will be determined by your treatment team.  Patients discharged the day of surgery will not be allowed to drive home.   Special instructions:  [place] - Preparing for Surgery  Before surgery, you can play an important role.  Because skin is not sterile, your skin needs to be as free of germs as possible.  You can reduce the number of germs on you skin by washing with CHG (chlorahexidine gluconate) soap before surgery.  CHG is an antiseptic cleaner which kills germs and bonds with the skin to continue killing germs even after washing.  Please DO NOT use if you have an allergy to CHG or antibacterial soaps.  If your skin becomes reddened/irritated stop using the CHG and inform your nurse when you arrive at Short Stay.  Do not shave (including legs and underarms) for at least 48 hours prior to the first CHG shower.  You may shave your face.  Please follow these instructions carefully:   1.  Shower  with CHG Soap the night before surgery and the                                morning of Surgery.  2.  If you choose to wash your hair, wash your hair first as usual with your       normal shampoo.  3.  After you shampoo, rinse your hair and body thoroughly to remove the                      Shampoo.  4.  Use CHG as you would any other liquid soap.  You can apply chg directly       to the skin and wash gently with scrungie or a clean washcloth.  5.  Apply the CHG Soap to your body ONLY FROM THE NECK DOWN.        Do not use on open wounds or open sores.  Avoid contact with your eyes, ears, mouth and genitals (private parts).  Wash genitals (private parts) with your normal soap.  6.  Wash thoroughly, paying special attention to the area where your surgery        will be performed.  7.  Thoroughly rinse your body with warm water from the neck down.  8.  DO  NOT shower/wash with your normal soap after using and rinsing off       the CHG Soap.  9.  Pat yourself dry with a clean towel.            10.  Wear clean pajamas.            11.  Place clean sheets on your bed the night of your first shower and do not        sleep with pets.  Day of Surgery  Do not apply any lotions the morning of surgery.  Please wear clean clothes to the hospital.   Please read over the following fact sheets that you were given. Pain Booklet and Coughing and Deep Breathing

## 2015-04-16 NOTE — Telephone Encounter (Signed)
Returned call. Was asked what heart meds this patient is on as our office is concerned.  Last note of Dr Eldridge DaceVaranasi from 04-06-15 office note says  1) Aspirin 325mg  daily, 2) metoprolol 25mg  twice a day, 3) digoxin 62.5mcg daily, 4) hydralazine 10mg  daily, 5) losartan 25mg  daily.

## 2015-04-19 NOTE — Progress Notes (Signed)
Anesthesia Chart Review: Patient is a 64 year old female scheduled for direct laryngoscopy and trach change on 04/20/15 by Dr. Emeline DarlingGore.  History includes NSTEMI 07/2014 (initially declined CABG, but agreed 01/2015 due to unstable angina with elevated troponin (0.48). Had CABG (LIMA-LAD, SVG-DIAG, SVG-OM, SVG-PD) and placement of Centrimag LVAD (for ischemic CM and low EF) 02/01/15 with removal of LVAD 02/05/15 and sternal closure 02/08/15. Hospitalized 01/26/15 - 03/15/15. Her post-operatively hospital course was complicated by ARDS and required percutaneous tracheostomy 02/15/15 and PEG tube. Other history includes non-smoker, HLD, CAD, systolic CHF, ischemic CM, HTN. Patient was [facility]. By PAT RN notes, patient is now eating solid foods and can talk around trach.  No PCP. CT surgeon is Dr. Donata ClayVan Trigt, last visit 04/14/15, who is aware of surgery plans. Cardiologist is Dr. Eldridge DaceVaranasi, last visit 04/06/15. Pulmonologist is Dr. Sherene SiresWert.  Meds include albuterol, ASA 325mg , digoxin, hydralazine, losartan, Lopressor, pravastatin.  03/29/15 EKG ([facility]): ST at 111 bpm, right atrial enlargement, septal infarct (age undetermined). ST/T wave abnormality, consider inferolateral ischemia. EKG felt similar to tracing from 03/12/15.   02/20/15 Echo: Study Conclusions - Procedure narrative: Transthoracic echocardiography. Image quality was adequate. The study was technically difficult, as a result of surgical dressings. - Left ventricle: The cavity size was normal. Wall thickness was normal. Systolic function was severely reduced. The estimated ejection fraction was in the range of 25% to 30%. - Left atrium: The atrium was moderately dilated. - Right ventricle: RV is not seen well enough to evaluate function. - Pulmonary arteries: PA peak pressure: 36 mm Hg (S). (Previous EF: 02/05/15 TEE 30-35%, 01/27/15 TTE 20-25%, 07/10/14 TTE 40-45%.)  Last RHC/LCH was 07/13/14 (prior  to CABG) and showed Severe 3V CAD, LVEDP 8 mm Hg, normal right heart pressures. CABG recommended.  01/28/15 Carotid duplex: Summary: Right: mild soft plaque CCA. Mild to moderate mixed plaque origin and proximal ICA and ECA. 1-39% ICA stenosis. Left: Mild to moderate soft plaque CCA. Moderate to severe mixed plaque origin and proximal ICA and ECA. 40-59% ICA stenosis, highest end of scale. Bilateral vertebral artery flow is antegrade. ICA/CCA ratio: R-0.69 L-1.56.  04/14/15 CXR (noted by Dr. Donata ClayVan Trigt): IMPRESSION: Slight increase conspicuity of patchy density in the inferior aspect of the right upper lobe. This may reflect subsegmental atelectasis or early pneumonia. There is no evidence of pulmonary edema, pleural effusion, or pneumothorax.  01/27/15 PFTs: FVC 1/44. FEV1 1.29, DLCOunc 11.77 (72%).  Pre-operative labs noted. BMET WNL. H/H 10.7/34.2.  Patient with significant cardiac history but on-going close follow-up with cardiology and CT surgery. Dr. Donata ClayVan Trigt is aware of surgery plans. If no acute changes then I would anticipate that she could proceed as planned.  Velna Ochsllison Finch Costanzo, PA-C [facility]/Anesthesiology Phone [phone] 04/19/2015 3:08 PM

## 2015-04-20 ENCOUNTER — Telehealth: Payer: Self-pay | Admitting: Interventional Cardiology

## 2015-04-20 ENCOUNTER — Other Ambulatory Visit: Payer: Self-pay | Admitting: *Deleted

## 2015-04-20 ENCOUNTER — Encounter (HOSPITAL_COMMUNITY): Payer: Self-pay | Admitting: Certified Registered Nurse Anesthetist

## 2015-04-20 ENCOUNTER — Ambulatory Visit (HOSPITAL_COMMUNITY): Payer: Medicaid Other | Admitting: Vascular Surgery

## 2015-04-20 ENCOUNTER — Ambulatory Visit (HOSPITAL_COMMUNITY): Payer: Medicaid Other | Admitting: Certified Registered Nurse Anesthetist

## 2015-04-20 ENCOUNTER — Encounter (HOSPITAL_COMMUNITY)
Admission: RE | Disposition: A | Discharge status: Home / Self Care | Payer: Self-pay | Source: Ambulatory Visit | Attending: Otolaryngology

## 2015-04-20 ENCOUNTER — Ambulatory Visit (HOSPITAL_COMMUNITY)
Admission: RE | Admit: 2015-04-20 | Discharge: 2015-04-20 | Disposition: A | Discharge status: Home / Self Care | Payer: Medicaid Other | Source: Ambulatory Visit | Attending: Otolaryngology | Admitting: Otolaryngology

## 2015-04-20 DIAGNOSIS — Z79899 Other long term (current) drug therapy: Secondary | ICD-10-CM | POA: Insufficient documentation

## 2015-04-20 DIAGNOSIS — J9503 Malfunction of tracheostomy stoma: Secondary | ICD-10-CM | POA: Diagnosis present

## 2015-04-20 DIAGNOSIS — Y833 Surgical operation with formation of external stoma as the cause of abnormal reaction of the patient, or of later complication, without mention of misadventure at the time of the procedure: Secondary | ICD-10-CM | POA: Insufficient documentation

## 2015-04-20 DIAGNOSIS — I2510 Atherosclerotic heart disease of native coronary artery without angina pectoris: Secondary | ICD-10-CM | POA: Diagnosis not present

## 2015-04-20 DIAGNOSIS — E785 Hyperlipidemia, unspecified: Secondary | ICD-10-CM | POA: Diagnosis not present

## 2015-04-20 DIAGNOSIS — I10 Essential (primary) hypertension: Secondary | ICD-10-CM | POA: Insufficient documentation

## 2015-04-20 DIAGNOSIS — Z888 Allergy status to other drugs, medicaments and biological substances status: Secondary | ICD-10-CM | POA: Insufficient documentation

## 2015-04-20 DIAGNOSIS — Z7982 Long term (current) use of aspirin: Secondary | ICD-10-CM | POA: Insufficient documentation

## 2015-04-20 DIAGNOSIS — I252 Old myocardial infarction: Secondary | ICD-10-CM | POA: Diagnosis not present

## 2015-04-20 DIAGNOSIS — R1310 Dysphagia, unspecified: Secondary | ICD-10-CM | POA: Diagnosis not present

## 2015-04-20 DIAGNOSIS — Z951 Presence of aortocoronary bypass graft: Secondary | ICD-10-CM | POA: Insufficient documentation

## 2015-04-20 HISTORY — PX: RIGID BRONCHOSCOPY: SHX5069

## 2015-04-20 HISTORY — PX: LARYNGOSCOPY AND BRONCHOSCOPY: SHX5659

## 2015-04-20 SURGERY — LARYNGOSCOPY, WITH BRONCHOSCOPY
Anesthesia: General | Site: Neck

## 2015-04-20 MED ORDER — DEXAMETHASONE SODIUM PHOSPHATE 10 MG/ML IJ SOLN: 10.0000 mg | Freq: Once | INTRAMUSCULAR | Status: DC

## 2015-04-20 MED ORDER — EPINEPHRINE HCL (NASAL) 0.1 % NA SOLN
NASAL | Status: AC
  Filled 2015-04-20: qty 30

## 2015-04-20 MED ORDER — SUGAMMADEX SODIUM 200 MG/2ML IV SOLN
INTRAVENOUS | Status: AC
  Filled 2015-04-20: qty 2

## 2015-04-20 MED ORDER — ROCURONIUM BROMIDE 100 MG/10ML IV SOLN
INTRAVENOUS | Status: DC | PRN
  Administered 2015-04-20: 30 mg via INTRAVENOUS

## 2015-04-20 MED ORDER — FENTANYL CITRATE (PF) 100 MCG/2ML IJ SOLN
25.0000 ug | INTRAMUSCULAR | Status: DC | PRN
  Administered 2015-04-20: 25 ug via INTRAVENOUS

## 2015-04-20 MED ORDER — FENTANYL CITRATE (PF) 100 MCG/2ML IJ SOLN
INTRAMUSCULAR | Status: DC | PRN
  Administered 2015-04-20 (×2): 50 ug via INTRAVENOUS

## 2015-04-20 MED ORDER — LIDOCAINE-EPINEPHRINE 1 %-1:100000 IJ SOLN
INTRAMUSCULAR | Status: AC
  Filled 2015-04-20: qty 1

## 2015-04-20 MED ORDER — ETOMIDATE 2 MG/ML IV SOLN
INTRAVENOUS | Status: DC | PRN
  Administered 2015-04-20: 14 mg via INTRAVENOUS

## 2015-04-20 MED ORDER — ONDANSETRON HCL 4 MG/2ML IJ SOLN
INTRAMUSCULAR | Status: DC | PRN
  Administered 2015-04-20: 4 mg via INTRAVENOUS

## 2015-04-20 MED ORDER — CEFAZOLIN SODIUM 1 G IJ SOLR
1000.0000 mg | INTRAMUSCULAR | Status: DC
  Filled 2015-04-20: qty 10

## 2015-04-20 MED ORDER — LOSARTAN POTASSIUM 25 MG PO TABS: 25.0000 mg | ORAL_TABLET | Freq: Every day | ORAL | Status: DC

## 2015-04-20 MED ORDER — SUGAMMADEX SODIUM 200 MG/2ML IV SOLN
INTRAVENOUS | Status: DC | PRN
  Administered 2015-04-20: 169.6 mg via INTRAVENOUS

## 2015-04-20 MED ORDER — ASPIRIN 325 MG PO TBEC: 325.0000 mg | DELAYED_RELEASE_TABLET | Freq: Every day | ORAL | Status: DC

## 2015-04-20 MED ORDER — EPINEPHRINE HCL (NASAL) 0.1 % NA SOLN
NASAL | Status: DC | PRN
  Administered 2015-04-20: 30 mL via NASAL

## 2015-04-20 MED ORDER — FENTANYL CITRATE (PF) 100 MCG/2ML IJ SOLN
INTRAMUSCULAR | Status: AC
  Administered 2015-04-20: 25 ug via INTRAVENOUS
  Filled 2015-04-20: qty 2

## 2015-04-20 MED ORDER — LIDOCAINE HCL (CARDIAC) 20 MG/ML IV SOLN
INTRAVENOUS | Status: DC | PRN
  Administered 2015-04-20: 60 mg via INTRAVENOUS

## 2015-04-20 MED ORDER — OXYCODONE HCL 5 MG PO TABS: 5.0000 mg | ORAL_TABLET | Freq: Once | ORAL | Status: DC | PRN

## 2015-04-20 MED ORDER — PROPOFOL 10 MG/ML IV BOLUS
INTRAVENOUS | Status: AC
Start: 2015-04-20 — End: 2015-04-20
  Filled 2015-04-20: qty 20

## 2015-04-20 MED ORDER — LACTATED RINGERS IV SOLN
INTRAVENOUS | Status: DC | PRN
  Administered 2015-04-20: 07:00:00 via INTRAVENOUS

## 2015-04-20 MED ORDER — FENTANYL CITRATE (PF) 250 MCG/5ML IJ SOLN
INTRAMUSCULAR | Status: AC
Start: 2015-04-20 — End: 2015-04-20
  Filled 2015-04-20: qty 5

## 2015-04-20 MED ORDER — EPINEPHRINE HCL 1 MG/ML IJ SOLN
INTRAMUSCULAR | Status: AC
  Filled 2015-04-20: qty 1

## 2015-04-20 MED ORDER — OXYCODONE HCL 5 MG/5ML PO SOLN: 5.0000 mg | Freq: Once | ORAL | Status: DC | PRN

## 2015-04-20 MED ORDER — 0.9 % SODIUM CHLORIDE (POUR BTL) OPTIME
TOPICAL | Status: DC | PRN
  Administered 2015-04-20: 1000 mL

## 2015-04-20 MED ORDER — ONDANSETRON HCL 4 MG/2ML IJ SOLN: 4.0000 mg | Freq: Four times a day (QID) | INTRAMUSCULAR | Status: DC | PRN

## 2015-04-20 MED ORDER — LIDOCAINE-EPINEPHRINE 1 %-1:100000 IJ SOLN
INTRAMUSCULAR | Status: DC | PRN
  Administered 2015-04-20: 20 mL

## 2015-04-20 MED ORDER — CEFAZOLIN SODIUM 1-5 GM-% IV SOLN
1.0000 g | Freq: Once | INTRAVENOUS | Status: AC
  Administered 2015-04-20: 1 g via INTRAVENOUS

## 2015-04-20 MED ORDER — MIDAZOLAM HCL 2 MG/2ML IJ SOLN
INTRAMUSCULAR | Status: AC
Start: 2015-04-20 — End: 2015-04-20
  Filled 2015-04-20: qty 2

## 2015-04-20 SURGICAL SUPPLY — 36 items
BLADE SURG 15 STRL LF DISP TIS (BLADE) IMPLANT
BLADE SURG 15 STRL SS (BLADE) ×3
CANISTER SUCTION 2500CC (MISCELLANEOUS) ×3 IMPLANT
CONT SPEC 4OZ CLIKSEAL STRL BL (MISCELLANEOUS) ×1 IMPLANT
COVER MAYO STAND STRL (DRAPES) ×2 IMPLANT
COVER TABLE BACK 60X90 (DRAPES) ×3 IMPLANT
DRAPE PROXIMA HALF (DRAPES) ×3 IMPLANT
ELECT CAUTERY BLADE 6.4 (BLADE) ×2 IMPLANT
ELECT REM PT RETURN 9FT ADLT (ELECTROSURGICAL) ×3
ELECTRODE REM PT RTRN 9FT ADLT (ELECTROSURGICAL) IMPLANT
GAUZE SPONGE 4X4 12PLY STRL (GAUZE/BANDAGES/DRESSINGS) ×3 IMPLANT
GAUZE SPONGE 4X4 16PLY XRAY LF (GAUZE/BANDAGES/DRESSINGS) ×2 IMPLANT
GLOVE SS BIOGEL STRL SZ 7.5 (GLOVE) ×1 IMPLANT
GLOVE SUPERSENSE BIOGEL SZ 7.5 (GLOVE) ×2
GUARD TEETH (MISCELLANEOUS) ×2 IMPLANT
KIT ROOM TURNOVER OR (KITS) ×3 IMPLANT
MARKER SKIN DUAL TIP RULER LAB (MISCELLANEOUS) IMPLANT
NDL HYPO 25GX1X1/2 BEV (NEEDLE) IMPLANT
NEEDLE HYPO 25GX1X1/2 BEV (NEEDLE) ×3 IMPLANT
NS IRRIG 1000ML POUR BTL (IV SOLUTION) ×3 IMPLANT
PAD ARMBOARD 7.5X6 YLW CONV (MISCELLANEOUS) ×6 IMPLANT
PAD EYE OVAL STERILE LF (GAUZE/BANDAGES/DRESSINGS) ×4 IMPLANT
PATTIES SURGICAL .5 X3 (DISPOSABLE) ×2 IMPLANT
PENCIL BUTTON HOLSTER BLD 10FT (ELECTRODE) ×2 IMPLANT
SOLUTION ANTI FOG 6CC (MISCELLANEOUS) ×3 IMPLANT
SPECIMEN JAR SMALL (MISCELLANEOUS) ×1 IMPLANT
SPONGE GAUZE 4X4 12PLY STER LF (GAUZE/BANDAGES/DRESSINGS) ×2 IMPLANT
SPONGE INTESTINAL PEANUT (DISPOSABLE) IMPLANT
SURGILUBE 2OZ TUBE FLIPTOP (MISCELLANEOUS) ×1 IMPLANT
SYR BULB IRRIGATION 50ML (SYRINGE) ×2 IMPLANT
SYR CONTROL 10ML LL (SYRINGE) ×2 IMPLANT
TOWEL OR 17X24 6PK STRL BLUE (TOWEL DISPOSABLE) ×4 IMPLANT
TRAP SPECIMEN MUCOUS 40CC (MISCELLANEOUS) IMPLANT
TUBE CONNECTING 12'X1/4 (SUCTIONS) ×1
TUBE CONNECTING 12X1/4 (SUCTIONS) ×2 IMPLANT
WATER STERILE IRR 1000ML POUR (IV SOLUTION) ×1 IMPLANT

## 2015-04-20 NOTE — Anesthesia Preprocedure Evaluation (Signed)
Anesthesia Evaluation  Patient identified by MRN, date of birth, ID band Patient awake    Reviewed: Allergy & Precautions, NPO status , Patient's Chart, lab work & pertinent test results  Airway Mallampati: Trach   Neck ROM: full    Dental   Pulmonary shortness of breath,  Recent hospitalization that involved CABG, VAD x4 days, and ARDS requiring tracheostomy.     breath sounds clear to auscultation       Cardiovascular hypertension, + angina + CAD, + Past MI, + CABG and +CHF   Rhythm:regular Rate:Normal  EF 25-30%.   Neuro/Psych    GI/Hepatic   Endo/Other    Renal/GU      Musculoskeletal   Abdominal   Peds  Hematology   Anesthesia Other Findings   Reproductive/Obstetrics                             Anesthesia Physical Anesthesia Plan  ASA: III  Anesthesia Plan: General   Post-op Pain Management:    Induction: Intravenous  Airway Management Planned: Tracheostomy  Additional Equipment:   Intra-op Plan:   Post-operative Plan: Possible Post-op intubation/ventilation  Informed Consent: I have reviewed the patients History and Physical, chart, labs and discussed the procedure including the risks, benefits and alternatives for the proposed anesthesia with the patient or authorized representative who has indicated his/her understanding and acceptance.     Plan Discussed with: CRNA, Anesthesiologist and Surgeon  Anesthesia Plan Comments:         Anesthesia Quick Evaluation

## 2015-04-20 NOTE — Anesthesia Postprocedure Evaluation (Signed)
Anesthesia Post Note  Patient: Breanna Alvarez  Procedure(s) Performed: Procedure(s) (LRB): Direct Laryngoscopy and Decannulation (N/A) RIGID BRONCHOSCOPY (N/A)  Patient location during evaluation: PACU Anesthesia Type: General Level of consciousness: awake and alert and patient cooperative Pain management: pain level controlled Vital Signs Assessment: post-procedure vital signs reviewed and stable Respiratory status: spontaneous breathing and respiratory function stable Cardiovascular status: stable Anesthetic complications: no    Last Vitals:  Filed Vitals:   04/20/15 0855 04/20/15 0856  BP:    Pulse: 85 86  Temp:  36.9 C  Resp: 16 15    Last Pain:  Filed Vitals:   04/20/15 0915  PainSc: 0-No pain                 Favor Kreh S

## 2015-04-20 NOTE — Telephone Encounter (Signed)
Refilled asprin 325 mg and losartan 25 mg the rest were sent to [facility] via for clarification of correct dosage.

## 2015-04-20 NOTE — Telephone Encounter (Signed)
NEW MESSAGE   *STAT* If patient is at the pharmacy, call can be transferred to refill team.   1. Which medications need to be refilled? (please list name of each medication and dose if known) LOSARTIN 25MG , HYDRALAZINE 10MG , DIGOXIN 62.5 MCG TABS, METOPROLOL 25MG , ASPRIN, PRAVASTATIN 80MG   2. Which pharmacy/location (including street and city if local pharmacy) is medication to be sent to? [facility] [address] ([place]  3. Do they need a 30 day or 90 day supply? 30 DAY FOR ALL OF THEM

## 2015-04-20 NOTE — Anesthesia Procedure Notes (Signed)
Date/Time: 04/20/2015 7:33 AM Performed by: Virgel GessHOLTZMAN, Veida Spira LEFFEW Pre-anesthesia Checklist: Patient identified, Emergency Drugs available, Suction available, Patient being monitored and Timeout performed Patient Re-evaluated:Patient Re-evaluated prior to inductionOxygen Delivery Method: Circle system utilized Preoxygenation: Pre-oxygenation with 100% oxygen Intubation Type: IV induction and Tracheostomy

## 2015-04-20 NOTE — Op Note (Signed)
DATE OF OPERATION: 04/20/2015 Surgeon: Melvenia BeamGore, Casondra Gasca Procedure Performed: [id number] direct laryngoscopy, diagnostic, with telescope [id number] diagnostic rigid bronchoscopy  PREOPERATIVE DIAGNOSIS: tracheostomy dependence, dysphagia POSTOPERATIVE DIAGNOSIS: tracheostomy dependence, dysphagia  SURGEON: Melvenia BeamGore, Tristy Udovich ANESTHESIA: General via tracheostomy converted to general via endotracheal.  ESTIMATED BLOOD LOSS: minimal  DRAINS: none SPECIMENS: none INDICATIONS: The patient is a 63yo AAF with a history of percutaneous tracheostomy after cardiac surgery for acute respiratory failure. The tracheostomy site was stenotic and the tracheostomy tube could not be removed in the office, so the patient required OR laryngoscopy/bronchoscopy to determine whether she could be decannulated. DESCRIPTION OF OPERATION: The patient was brought to the operating room and was placed in the supine position and was placed under general endotracheal anesthesia by anesthesiology using her preexisting 6 cuffed tracheostomy.   Direct laryngoscopy was next performed using the Dedo laryngoscope and the Dedo was suspended on the Mayo using the suspension setup. The tongue base and posterior pharynx were normal. The false vocal folds and true vocal folds were normal and widely patent, and she was a grade I view with the Dedo laryngoscope.  Next I injected 6mL total of 1% lidocaine with epinephrine around her trach site, and the old 6 cuffed trach was removed.  Rigid bronchoscopy was then performed using the 3mm telescope through the Dedo laryngoscope. The trachea and subglottis appeared normal and widely patent, and other than minimal blood from removing the trach tube which I suctioned out using the straight suction, the airway was widely patent including the trachea, carina and the mainstem bronchi. As the laryngeal and tracheal/bronchial airways were widely patent, I removed the bronchoscope and intubated her from above using a  7.0 ETtube, and then removed the Dedo laryngoscope, and I dressed her trach site with sterile 4x4 gauze and tape.  The patient was turned back to anesthesia and awakened from anesthesia and extubated without difficulty. The patient tolerated the procedure well with no immediate complications and was taken to the postoperative recovery area in good condition.   Dr. Melvenia BeamMitchell Kellan Boehlke was present and performed the entire procedure. 04/20/2015  8:06 AM Melvenia BeamGore, Najmah Carradine

## 2015-04-20 NOTE — Discharge Instructions (Signed)
Follow up with Dr. Emeline DarlingGore in 2 weeks. Change trach site dressing daily and as needed with 4x4 gauze and tape. Breanna Alvarez site will close over the next few weeks. OTC tylenol, ibuprofen, or aleve as needed for pain. Regular diet and up ad lib as tolerated.

## 2015-04-20 NOTE — Transfer of Care (Signed)
Immediate Anesthesia Transfer of Care Note  Patient: Salli QuarryMaria T Dunwoody  Procedure(s) Performed: Procedure(s): Direct Laryngoscopy with Trach change (N/A)  Patient Location: PACU  Anesthesia Type:General  Level of Consciousness: awake, alert , oriented, patient cooperative and responds to stimulation  Airway & Oxygen Therapy: Patient Spontanous Breathing and Patient connected to nasal cannula oxygen  Post-op Assessment: Report given to RN, Post -op Vital signs reviewed and stable and Patient moving all extremities X 4  Post vital signs: Reviewed and stable  Last Vitals:  Filed Vitals:   04/20/15 0614 04/20/15 0806  BP: 135/58   Pulse: 94   Temp: 36.3 C 36.5 C  Resp: 16     Complications: No apparent anesthesia complications

## 2015-04-21 ENCOUNTER — Other Ambulatory Visit: Payer: Self-pay | Admitting: *Deleted

## 2015-04-21 ENCOUNTER — Ambulatory Visit (HOSPITAL_COMMUNITY): Payer: Medicaid Other

## 2015-04-21 ENCOUNTER — Encounter (HOSPITAL_COMMUNITY): Payer: Self-pay | Admitting: Otolaryngology

## 2015-04-21 DIAGNOSIS — Z8639 Personal history of other endocrine, nutritional and metabolic disease: Secondary | ICD-10-CM

## 2015-04-21 MED ORDER — DIGOXIN 62.5 MCG PO TABS: 0.0625 mg | ORAL_TABLET | Freq: Every day | ORAL | Status: DC

## 2015-04-21 MED ORDER — PRAVASTATIN SODIUM 80 MG PO TABS: 80.0000 mg | ORAL_TABLET | Freq: Every evening | ORAL | Status: DC

## 2015-04-21 MED ORDER — METOPROLOL TARTRATE 25 MG PO TABS: 25.0000 mg | ORAL_TABLET | Freq: Two times a day (BID) | ORAL | Status: DC

## 2015-04-21 MED ORDER — HYDRALAZINE HCL 10 MG PO TABS: 10.0000 mg | ORAL_TABLET | Freq: Every day | ORAL | Status: DC

## 2015-04-21 NOTE — Telephone Encounter (Signed)
Called patient to verify medication dosages. Patient stated she has not taken any medications in a while. When asked about Metoprolol 25mg  she said she believes it is 2 times daily, Digoxin 62.5 MCG once daily, Pravastatin 80mg  daily, and Hydralazine 10 mg once daily.

## 2015-04-22 ENCOUNTER — Other Ambulatory Visit: Payer: Self-pay | Admitting: *Deleted

## 2015-04-22 DIAGNOSIS — E46 Unspecified protein-calorie malnutrition: Secondary | ICD-10-CM

## 2015-04-28 ENCOUNTER — Ambulatory Visit (HOSPITAL_COMMUNITY)
Admission: RE | Admit: 2015-04-28 | Discharge: 2015-04-28 | Disposition: A | Discharge status: Home / Self Care | Payer: Medicaid Other | Source: Ambulatory Visit | Attending: Cardiothoracic Surgery | Admitting: Cardiothoracic Surgery

## 2015-04-28 DIAGNOSIS — Z431 Encounter for attention to gastrostomy: Secondary | ICD-10-CM | POA: Diagnosis present

## 2015-04-28 DIAGNOSIS — Z951 Presence of aortocoronary bypass graft: Secondary | ICD-10-CM | POA: Insufficient documentation

## 2015-04-28 DIAGNOSIS — R1310 Dysphagia, unspecified: Secondary | ICD-10-CM | POA: Insufficient documentation

## 2015-04-28 DIAGNOSIS — Z930 Tracheostomy status: Secondary | ICD-10-CM | POA: Diagnosis not present

## 2015-04-28 DIAGNOSIS — E46 Unspecified protein-calorie malnutrition: Secondary | ICD-10-CM

## 2015-04-28 MED ORDER — LIDOCAINE VISCOUS 2 % MT SOLN
OROMUCOSAL | Status: AC
  Filled 2015-04-28: qty 15

## 2015-04-28 NOTE — Procedures (Signed)
Successful removal of bumper retention gastrostomy tube. No complications  Brayton ElKevin Roxane Puerto PA-C Interventional Radiology 04/28/2015 10:06 AM

## 2015-05-11 ENCOUNTER — Ambulatory Visit (HOSPITAL_COMMUNITY): Payer: Medicaid Other | Attending: Cardiovascular Disease

## 2015-05-11 ENCOUNTER — Other Ambulatory Visit: Payer: Self-pay

## 2015-05-11 DIAGNOSIS — I340 Nonrheumatic mitral (valve) insufficiency: Secondary | ICD-10-CM | POA: Diagnosis not present

## 2015-05-11 DIAGNOSIS — I255 Ischemic cardiomyopathy: Secondary | ICD-10-CM

## 2015-05-11 DIAGNOSIS — I517 Cardiomegaly: Secondary | ICD-10-CM | POA: Diagnosis not present

## 2015-05-11 DIAGNOSIS — I351 Nonrheumatic aortic (valve) insufficiency: Secondary | ICD-10-CM | POA: Diagnosis not present

## 2015-05-11 DIAGNOSIS — I10 Essential (primary) hypertension: Secondary | ICD-10-CM | POA: Insufficient documentation

## 2015-05-11 DIAGNOSIS — E785 Hyperlipidemia, unspecified: Secondary | ICD-10-CM | POA: Diagnosis not present

## 2015-05-14 ENCOUNTER — Telehealth: Payer: Self-pay | Admitting: *Deleted

## 2015-05-14 DIAGNOSIS — I2510 Atherosclerotic heart disease of native coronary artery without angina pectoris: Secondary | ICD-10-CM

## 2015-05-14 MED ORDER — SPIRONOLACTONE 25 MG PO TABS: 12.5000 mg | ORAL_TABLET | Freq: Every day | ORAL | Status: DC

## 2015-05-14 NOTE — Telephone Encounter (Signed)
-----   Message from Leone BrandLaura R Ingold, NP sent at 05/11/2015  5:40 PM EST ----- Please add aldactone to meds 12.5 mg daily and check BMP on Monday. Thanks

## 2015-05-14 NOTE — Telephone Encounter (Signed)
Spoke with pt and explained her ECHO and that we are going to start Aldactone 12.5 mg taking 1 tablet daily and repeat BMP on 05/17/15.  Pt was advised to call the office on Monday, but as of right now, we planned to open at 10:00 a.m.  Pt and husband verbalized understanding.

## 2015-05-18 ENCOUNTER — Telehealth: Payer: Self-pay | Admitting: Interventional Cardiology

## 2015-05-18 NOTE — Telephone Encounter (Signed)
Calling stating that [facility].  Wants to come Fri 1/13 because husband will have to bring her.  Reschedule BMET for Friday 1/13. She was able to get the Aldactone and is taking 12.5 mg daily. States she is not having any problems.

## 2015-05-18 NOTE — Telephone Encounter (Signed)
New problem   Pt stated she returning call from nurse.

## 2015-05-19 ENCOUNTER — Encounter: Payer: Self-pay | Admitting: Cardiothoracic Surgery

## 2015-05-19 ENCOUNTER — Ambulatory Visit (INDEPENDENT_AMBULATORY_CARE_PROVIDER_SITE_OTHER): Payer: Medicaid Other | Admitting: Cardiothoracic Surgery

## 2015-05-19 VITALS — BP 158/80 | HR 83 | Resp 16 | Ht 60.0 in | Wt 95.0 lb

## 2015-05-19 DIAGNOSIS — J80 Acute respiratory distress syndrome: Secondary | ICD-10-CM | POA: Diagnosis not present

## 2015-05-19 DIAGNOSIS — I2510 Atherosclerotic heart disease of native coronary artery without angina pectoris: Secondary | ICD-10-CM

## 2015-05-19 DIAGNOSIS — Z951 Presence of aortocoronary bypass graft: Secondary | ICD-10-CM

## 2015-05-19 NOTE — Progress Notes (Signed)
PCP is No PCP Per Patient Referring Provider is Corky CraftsVaranasi, Jayadeep S, MD  Chief Complaint  Patient presents with  . Routine Post Op    1 month f/u with a CXR    [id number]:Patient doing well now several months after urgent CABG for severe ischemic cardiomyopathy with multivessel CABG and combined temporary left ventricular assist device She has no symptoms of angina or CHF this time. Her preoperative EF was 10-15%. Her echo performed last week showed EF of 40% with only minimal MR.  The patient's tracheostomy stoma was decannulated and repaired by Dr. Emeline DarlingGore and is now healed. Her percutaneous gastrostomy tube has been removed. She is followed by cardiology-Dr. Forest GleasonVeranasi and has an appointment next month. She is on appropriate medications for her cardiac status.   Past Medical History  Diagnosis Date  . Hyperlipidemia   . Acute systolic CHF (congestive heart failure), NYHA class 3 (HCC) 07/10/2014  . Hypertension   . Coronary artery disease   . Tracheostomy tube present (HCC)   . History of blood transfusion   . Status post insertion of percutaneous endoscopic gastrostomy (PEG) tube [facility](HCC)     Past Surgical History  Procedure Laterality Date  . Left heart catheterization with coronary angiogram N/A 07/13/2014    Procedure: LEFT HEART CATHETERIZATION WITH CORONARY ANGIOGRAM;  Surgeon: Corky CraftsJayadeep S Varanasi, MD; mLAD 95%, dLAD 95%, D1 95%, CFX severe dz, mRCA 80%  . Coronary artery bypass graft N/A 02/01/2015    Procedure: CORONARY ARTERY BYPASS GRAFTING (CABG) X 4 UTILIZING THE LEFT INTERNAL MAMMARY ARTERY TO LAD, ENDOSCOPICALLY HARVESTED BILATERAL SAPHENEOUS VEIN GRAFTS  TO DIAGONAL, OM AND PD.;  Surgeon: Kerin PernaPeter Van Trigt, MD;  [facility]: [facility] OR;  Service: Open Heart Surgery;  Laterality: N/A;  . Tee without cardioversion N/A 02/01/2015    Procedure: TRANSESOPHAGEAL ECHOCARDIOGRAM (TEE);  Surgeon: Kerin PernaPeter Van Trigt, MD;  [facility];  Service: Open Heart Surgery;  Laterality: N/A;  . Placement of  centrimag ventricular assist device N/A 02/01/2015    Procedure: PLACEMENT OF CENTRIMAG VENTRICULAR ASSIST DEVICE;  Surgeon: Kerin PernaPeter Van Trigt, MD;  [facility];  Service: Open Heart Surgery;  Laterality: N/A;  . Removal of centrimag ventricular assist device N/A 02/05/2015    Procedure: REMOVAL OF CENTRIMAG VENTRICULAR ASSIST DEVICE;  Surgeon: Kerin PernaPeter Van Trigt, MD;  [facility];  Service: Open Heart Surgery;  Laterality: N/A;  . Cannulation for cardiopulmonary bypass N/A 02/05/2015    Procedure: CANNULATION FOR CARDIOPULMONARY BYPASS;  Surgeon: Kerin PernaPeter Van Trigt, MD;  [facility];  Service: Open Heart Surgery;  Laterality: N/A;  . Tee without cardioversion N/A 02/05/2015    Procedure: TRANSESOPHAGEAL ECHOCARDIOGRAM (TEE);  Surgeon: Kerin PernaPeter Van Trigt, MD;  [facility];  Service: Open Heart Surgery;  Laterality: N/A;  . Sternal closure N/A 02/08/2015    Procedure: STERNAL CLOSURE;  Surgeon: Kerin PernaPeter Van Trigt, MD;  [facility];  Service: Thoracic;  Laterality: N/A;  . Tee without cardioversion N/A 02/08/2015    Procedure: TRANSESOPHAGEAL ECHOCARDIOGRAM (TEE);  Surgeon: Kerin PernaPeter Van Trigt, MD;  [facility];  Service: Thoracic;  Laterality: N/A;  . Tubal ligation    . Laryngoscopy and bronchoscopy N/A 04/20/2015    Procedure: Direct Laryngoscopy and Decannulation;  Surgeon: Melvenia BeamMitchell Gore, MD;  [facility];  Service: ENT;  Laterality: N/A;  . Rigid bronchoscopy N/A 04/20/2015    Procedure: RIGID BRONCHOSCOPY;  Surgeon: Melvenia BeamMitchell Gore, MD;  [facility];  Service: ENT;  Laterality: N/A;    Family History  Problem Relation Age of Onset  . Heart attack Mother     MI when she was 60-70s, died at age 37  . Stroke Father     died when patient was 57 years old  . Hypertension Mother   . Hypertension Father     Social History Social History  Substance Use Topics  . Smoking status: Never Smoker   . Smokeless tobacco: Never Used  . Alcohol Use: No    Current Outpatient Prescriptions   Medication Sig Dispense Refill  . aspirin EC 81 MG tablet Take 81 mg by mouth daily.    . Digoxin 62.5 MCG TABS Take 0.0625 mg by mouth daily. 60 tablet 2  . metoprolol tartrate (LOPRESSOR) 25 MG tablet Take 1 tablet (25 mg total) by mouth 2 (two) times daily. 60 tablet 2  . pravastatin (PRAVACHOL) 80 MG tablet Take 80 mg by mouth daily.    Marland Kitchen spironolactone (ALDACTONE) 25 MG tablet Take 0.5 tablets (12.5 mg total) by mouth daily. 45 tablet 3  . hydrALAZINE (APRESOLINE) 10 MG tablet Take 1 tablet (10 mg total) by mouth daily. (Patient not taking: Reported on 05/19/2015) 30 tablet 2   No current facility-administered medications for this visit.    Allergies  Allergen Reactions  . Atorvastatin Other (See Comments)    Gas, chest tightness  . Crestor [Rosuvastatin Calcium] Other (See Comments)    Muscle aches    Review of Systems   Improved appetite and exercise tolerance All surgical incisions healed No significant surgical pain No ankle edema  BP 158/80 mmHg  Pulse 83  Resp 16  Ht 5' (1.524 m)  Wt 95 lb (43.092 kg)  BMI 18.55 kg/m2  SpO2 98% Physical Exam Alert and comfortable Neuro intact Heart rate regular  lungs clear  Diagnostic Tests: Echocardiogram performed January 3 personally  reviewed and shows significant improvement of LV  function  Impression: Excellent recovery after urgent CABG with temporary percutaneous LVAD support. Now  Close to normal LV function.  Plan:she will be followed by her cardiologist and return as needed   Mikey Bussing, MD [facility] [phone]

## 2015-05-21 ENCOUNTER — Telehealth: Payer: Self-pay | Admitting: Interventional Cardiology

## 2015-05-21 ENCOUNTER — Other Ambulatory Visit (INDEPENDENT_AMBULATORY_CARE_PROVIDER_SITE_OTHER): Payer: Medicaid Other | Admitting: *Deleted

## 2015-05-21 DIAGNOSIS — I2510 Atherosclerotic heart disease of native coronary artery without angina pectoris: Secondary | ICD-10-CM | POA: Diagnosis not present

## 2015-05-21 LAB — BASIC METABOLIC PANEL
BUN: 25 mg/dL (ref 7–25)
CALCIUM: 9.5 mg/dL (ref 8.6–10.4)
CO2: 26 mmol/L (ref 20–31)
Chloride: 105 mmol/L (ref 98–110)
Creat: 0.85 mg/dL (ref 0.50–0.99)
GLUCOSE: 110 mg/dL — AB (ref 65–99)
POTASSIUM: 4.1 mmol/L (ref 3.5–5.3)
SODIUM: 140 mmol/L (ref 135–146)

## 2015-05-21 NOTE — Addendum Note (Signed)
Addended by: Tonita PhoenixBOWDEN, ROBIN K on: 05/21/2015 10:17 AM   Modules accepted: Orders

## 2015-05-21 NOTE — Telephone Encounter (Signed)
Walk in pt form-pt asking for refills-placed in refill box

## 2015-05-24 ENCOUNTER — Other Ambulatory Visit: Payer: Self-pay | Admitting: *Deleted

## 2015-05-24 MED ORDER — DIGOXIN 62.5 MCG PO TABS: 0.0625 mg | ORAL_TABLET | Freq: Every day | ORAL | Status: DC

## 2015-05-24 MED ORDER — HYDRALAZINE HCL 10 MG PO TABS: 10.0000 mg | ORAL_TABLET | Freq: Every day | ORAL | Status: DC

## 2015-05-24 MED ORDER — METOPROLOL TARTRATE 25 MG PO TABS: 25.0000 mg | ORAL_TABLET | Freq: Two times a day (BID) | ORAL | Status: DC

## 2015-05-25 ENCOUNTER — Telehealth: Payer: Self-pay | Admitting: *Deleted

## 2015-05-25 NOTE — Telephone Encounter (Signed)
Per Nada Boozer, NP, called pt to inform her that her labs were normal.  Pt verbalized understanding and appreciation.

## 2015-05-25 NOTE — Telephone Encounter (Signed)
-----   Message from Leone Brand, NP sent at 05/24/2015  9:42 AM EST ----- Labs stable

## 2015-06-21 ENCOUNTER — Ambulatory Visit: Payer: Medicaid Other | Admitting: Interventional Cardiology

## 2015-07-01 ENCOUNTER — Encounter: Payer: Self-pay | Admitting: Interventional Cardiology

## 2015-07-01 ENCOUNTER — Ambulatory Visit (INDEPENDENT_AMBULATORY_CARE_PROVIDER_SITE_OTHER): Payer: Medicaid Other | Admitting: Interventional Cardiology

## 2015-07-01 VITALS — BP 160/70 | HR 91 | Ht 60.0 in | Wt 95.8 lb

## 2015-07-01 DIAGNOSIS — I10 Essential (primary) hypertension: Secondary | ICD-10-CM | POA: Diagnosis not present

## 2015-07-01 DIAGNOSIS — I255 Ischemic cardiomyopathy: Secondary | ICD-10-CM | POA: Diagnosis not present

## 2015-07-01 DIAGNOSIS — E785 Hyperlipidemia, unspecified: Secondary | ICD-10-CM | POA: Diagnosis not present

## 2015-07-01 DIAGNOSIS — I2510 Atherosclerotic heart disease of native coronary artery without angina pectoris: Secondary | ICD-10-CM

## 2015-07-01 DIAGNOSIS — Z951 Presence of aortocoronary bypass graft: Secondary | ICD-10-CM

## 2015-07-01 NOTE — Progress Notes (Signed)
Patient ID: [name], female   DOB: [birth year], 65 y.o.   MRN: [id number]     Cardiology Office Note   Date:  07/01/2015   ID:  Breanna, Alvarez 1950/08/18, MRN [id number]  PCP:  No PCP Per Patient    No chief complaint on file. f/u CAD   Wt Readings from Last 3 Encounters:  07/01/15 95 lb 12.8 oz (43.455 kg)  05/19/15 95 lb (43.092 kg)  04/16/15 93 lb 6 oz (42.355 kg)       History of Present Illness: Breanna Alvarez is a 65 y.o. female  who ha she had a non-STEMI in early 2016. Cardiac cath showed, located bifurcation LAD disease. She had multivessel coronary artery disease.  She declined bypass surgery. She had progressive angina. In September 2016, she agreed to bypass surgery. She had a very complicated course, mostly due to LV dysfunction and respiratory issues including ARDS and aspiration. She is now in a rehabilitation facility. She had a trach placed.  Overall, she is much stronger. She is in great spirits. She denies any chest discomfort. She walks a lot at home. Her blood pressures are typically well controlled with systolics in the 120-140 range. She has not had any low blood pressure readings.  She has not been on an ACE inhibitor despite her LV dysfunction.  ARB was started at her last visit with me but this was not on her list of medicines today.  Overall, she is pleased with the progress that she has made. She denies any palpitations or shortness of breath. No trouble lying flat. She is aware that her ejection fraction has improved significantly.    Past Medical History  Diagnosis Date  . Hyperlipidemia   . Acute systolic CHF (congestive heart failure), NYHA class 3 (HCC) 07/10/2014  . Hypertension   . Coronary artery disease   . Tracheostomy tube present (HCC)   . History of blood transfusion   . Status post insertion of percutaneous endoscopic gastrostomy (PEG) tube [facility])     Past Surgical History  Procedure Laterality Date  . Left heart  catheterization with coronary angiogram N/A 07/13/2014    Procedure: LEFT HEART CATHETERIZATION WITH CORONARY ANGIOGRAM;  Surgeon: Corky Crafts, MD; mLAD 95%, dLAD 95%, D1 95%, CFX severe dz, mRCA 80%  . Coronary artery bypass graft N/A 02/01/2015    Procedure: CORONARY ARTERY BYPASS GRAFTING (CABG) X 4 UTILIZING THE LEFT INTERNAL MAMMARY ARTERY TO LAD, ENDOSCOPICALLY HARVESTED BILATERAL SAPHENEOUS VEIN GRAFTS  TO DIAGONAL, OM AND PD.;  Surgeon: Kerin Perna, MD;  Location: [facility] OR;  Service: Open Heart Surgery;  Laterality: N/A;  . Tee without cardioversion N/A 02/01/2015    Procedure: TRANSESOPHAGEAL ECHOCARDIOGRAM (TEE);  Surgeon: Kerin Perna, MD;  [facility];  Service: Open Heart Surgery;  Laterality: N/A;  . Placement of centrimag ventricular assist device N/A 02/01/2015    Procedure: PLACEMENT OF CENTRIMAG VENTRICULAR ASSIST DEVICE;  Surgeon: Kerin Perna, MD;  Location: [facility] OR;  Service: Open Heart Surgery;  Laterality: N/A;  . Removal of centrimag ventricular assist device N/A 02/05/2015    Procedure: REMOVAL OF CENTRIMAG VENTRICULAR ASSIST DEVICE;  Surgeon: Kerin Perna, MD;  Location: [facility] OR;  Service: Open Heart Surgery;  Laterality: N/A;  . Cannulation for cardiopulmonary bypass N/A 02/05/2015    Procedure: CANNULATION FOR CARDIOPULMONARY BYPASS;  Surgeon: Kerin Perna, MD;  Location: [facility] OR;  Service: Open Heart Surgery;  Laterality: N/A;  . Tee without cardioversion  N/A 02/05/2015    Procedure: TRANSESOPHAGEAL ECHOCARDIOGRAM (TEE);  Surgeon: Kerin Perna, MD;  Location: [facility] OR;  Service: Open Heart Surgery;  Laterality: N/A;  . Sternal closure N/A 02/08/2015    Procedure: STERNAL CLOSURE;  Surgeon: Kerin Perna, MD;  Location: [facility] OR;  Service: Thoracic;  Laterality: N/A;  . Tee without cardioversion N/A 02/08/2015    Procedure: TRANSESOPHAGEAL ECHOCARDIOGRAM (TEE);  Surgeon: Kerin Perna, MD;  Location: [facility] OR;  Service: Thoracic;  Laterality: N/A;  . Tubal  ligation    . Laryngoscopy and bronchoscopy N/A 04/20/2015    Procedure: Direct Laryngoscopy and Decannulation;  Surgeon: Melvenia Beam, MD;  Location: [facility] OR;  Service: ENT;  Laterality: N/A;  . Rigid bronchoscopy N/A 04/20/2015    Procedure: RIGID BRONCHOSCOPY;  Surgeon: Melvenia Beam, MD;  Location: [facility] OR;  Service: ENT;  Laterality: N/A;     Current Outpatient Prescriptions  Medication Sig Dispense Refill  . aspirin EC 81 MG tablet Take 81 mg by mouth daily.    . Digoxin 62.5 MCG TABS Take 0.0625 mg by mouth daily. 60 tablet 2  . hydrALAZINE (APRESOLINE) 10 MG tablet Take 1 tablet (10 mg total) by mouth daily. 30 tablet 2  . metoprolol tartrate (LOPRESSOR) 25 MG tablet Take 1 tablet (25 mg total) by mouth 2 (two) times daily. 60 tablet 2  . pravastatin (PRAVACHOL) 80 MG tablet Take 80 mg by mouth daily.    Marland Kitchen spironolactone (ALDACTONE) 25 MG tablet Take 0.5 tablets (12.5 mg total) by mouth daily. 45 tablet 3   No current facility-administered medications for this visit.    Allergies:   Atorvastatin and Crestor    Social History:  The patient  reports that she has never smoked. She has never used smokeless tobacco. She reports that she does not drink alcohol or use illicit drugs.   Family History:  The patient's family history includes Heart attack in her mother; Hypertension in her father and mother; Stroke in her father.    ROS:  Please see the history of present illness.   Otherwise, review of systems are positive for respiratory issues as noted above.   All other systems are reviewed and negative.    PHYSICAL EXAM: VS:  BP 160/70 mmHg  Pulse 91  Ht 5' (1.524 m)  Wt 95 lb 12.8 oz (43.455 kg)  BMI 18.71 kg/m2 , BMI Body mass index is 18.71 kg/(m^2). GEN: Well nourished, well developed, in no acute distress HEENT: tracheostomy site repaired Neck: no JVD, carotid bruits, or masses Cardiac: RRR; no murmurs, rubs, or gallops,no edema  Respiratory:Coarse breath sounds to  auscultation bilaterally, normal work of breathing GI: soft, nontender, nondistended, + BS MS: no deformity or atrophy Skin: warm and dry, no rash Neuro:  Strength and sensation are intact Psych: euthymic mood, full affect     Recent Labs: 07/10/2014: B Natriuretic Peptide 1059.1* 07/14/2014: TSH 3.572 03/08/2015: Magnesium 2.2 03/29/2015: ALT 15 04/16/2015: Hemoglobin 10.7*; Platelets 336 05/21/2015: BUN 25; Creat 0.85; Potassium 4.1; Sodium 140   Lipid Panel    Component Value Date/Time   CHOL 223* 01/27/2015 0310   TRIG 171* 02/22/2015 0340   HDL 25* 01/27/2015 0310   CHOLHDL 8.9 01/27/2015 0310   VLDL 19 01/27/2015 0310   LDLCALC 179* 01/27/2015 0310     Other studies Reviewed: Additional studies/ records that were reviewed today with results demonstrating: Hospital discharge summary with findings noted above.   ASSESSMENT AND PLAN:  1. Ischemic cardiomyopathy/coronary artery disease/chronic  systolic heart failure: Ejection fraction was 25-30%. Status post bypass surgery. Much improved LVEF: 35-40%.  No angina. Added losartan 25 mg daily but this was stopped somewhere along the line.  Continue the hydralazine and spironolactone. Continue beta blocker.  Appears euvolemic.  Stop digoxin. 2. Tracheostomy: Removed and repaired 3. Hyperlipidemia: She is tolerating pravastatin 80 mg daily. She was intolerant of atorvastatin and Crestor. Ideally, LDL would be around 70. Given her LDL of 179 in September, it is unlikely that pravastatin will get her all the way down to target.  4. Hypertension: BP much better at home.  High in the MD office.  COntinue to watch BP.   5. I filled out an insurance form for her today during the visit.   Current medicines are reviewed at length with the patient today.  The patient concerns regarding her medicines were addressed.  The following changes have been made:  Stop digoxin  Labs/ tests ordered today include: BMet in a week  No orders of the  defined types were placed in this encounter.    Recommend 150 minutes/week of aerobic exercise Low fat, low carb, high fiber diet recommended  Disposition:   FU in 6 months   Delorise Jackson., MD  07/01/2015 1:24 PM    [facility] [address] [place], [place], [place]  [address] Phone: [phone]; Fax: [phone]

## 2015-07-01 NOTE — Patient Instructions (Addendum)
**Note De-Identified  Obfuscation** Medication Instructions:  Stop taking Digoxin-all other medications remain the same.  Labwork: Next Friday-CMET and Lipis-please do not eat or drink after midnight the night before labs are drawn.  Testing/Procedures: None  Follow-Up: Your physician wants you to follow-up in: 6 months. You will receive a reminder letter in the mail two months in advance. If you don't receive a letter, please call our office to schedule the follow-up appointment.     If you need a refill on your cardiac medications before your next appointment, please call your pharmacy.

## 2015-07-02 NOTE — Addendum Note (Signed)
Addended by: Reesa Chew on: 07/02/2015 10:09 AM   Modules accepted: Orders

## 2015-07-09 ENCOUNTER — Other Ambulatory Visit (INDEPENDENT_AMBULATORY_CARE_PROVIDER_SITE_OTHER): Payer: Medicaid Other | Admitting: *Deleted

## 2015-07-09 DIAGNOSIS — E785 Hyperlipidemia, unspecified: Secondary | ICD-10-CM | POA: Diagnosis not present

## 2015-07-09 LAB — COMPREHENSIVE METABOLIC PANEL
ALBUMIN: 4.1 g/dL (ref 3.6–5.1)
ALT: 13 U/L (ref 6–29)
AST: 12 U/L (ref 10–35)
Alkaline Phosphatase: 82 U/L (ref 33–130)
BILIRUBIN TOTAL: 0.5 mg/dL (ref 0.2–1.2)
BUN: 30 mg/dL — ABNORMAL HIGH (ref 7–25)
CO2: 25 mmol/L (ref 20–31)
CREATININE: 1.08 mg/dL — AB (ref 0.50–0.99)
Calcium: 9.3 mg/dL (ref 8.6–10.4)
Chloride: 107 mmol/L (ref 98–110)
Glucose, Bld: 105 mg/dL — ABNORMAL HIGH (ref 65–99)
Potassium: 4.5 mmol/L (ref 3.5–5.3)
SODIUM: 141 mmol/L (ref 135–146)
Total Protein: 7.9 g/dL (ref 6.1–8.1)

## 2015-07-09 LAB — LIPID PANEL
CHOL/HDL RATIO: 8.7 ratio — AB (ref <=5.0)
Cholesterol: 261 mg/dL — ABNORMAL HIGH (ref 125–200)
HDL: 30 mg/dL — ABNORMAL LOW (ref >=46)
LDL Cholesterol: 202 mg/dL — ABNORMAL HIGH (ref <130)
Triglycerides: 146 mg/dL (ref <150)
VLDL: 29 mg/dL (ref <30)

## 2015-07-09 NOTE — Addendum Note (Signed)
Addended by: Tonita PhoenixBOWDEN, [place] Hall[place] K on: 07/09/2015 08:24 AM   Modules accepted: Orders

## 2015-07-16 ENCOUNTER — Ambulatory Visit (INDEPENDENT_AMBULATORY_CARE_PROVIDER_SITE_OTHER): Payer: Medicaid Other | Admitting: Pharmacist

## 2015-07-16 DIAGNOSIS — E785 Hyperlipidemia, unspecified: Secondary | ICD-10-CM

## 2015-07-16 MED ORDER — ROSUVASTATIN CALCIUM 40 MG PO TABS: 40.0000 mg | ORAL_TABLET | Freq: Every day | ORAL | Status: DC

## 2015-07-16 NOTE — Progress Notes (Signed)
Patient ID: Breanna Alvarez, female   DOB: 12/20/1950, 65 y.o.   MRN: [id number]     Cardiology Office Note   Date:  07/16/2015   ID:  Breanna QuarryMaria T Alvarez, DOB 12/20/1950, MRN [id number]    Chief Complaint  Patient presents with  . Hyperlipidemia     History of Present Illness: Breanna Alvarez is a 65 y.o. female patient of Dr. Eldridge DaceVaranasi who was referred to the [facility] for elevated LDL.  She has a PMH of NSTEMI s/p CABG in September 2016.  She was first placed on Lipitor for her cholesterol but states she has stopped this due to back pain.  She was placed on Crestor 20mg  last June put she never tried this due to cost.  Her prescription was changed to pravastatin 80mg  at that time.  Her baseline LDL is ~300 mg/dL.    RF: CAD s/p CABG  LDL goal < 70 mg/dL, non-HDL goal < 782100 mg/dL Current therapy: pravastatin 80mg   Intolerances: Lipitor- back pain   Labs 07/09/15- TC 261, TG 146, HDL 30, LDL 202 (on pravastatin 80mg )   Past Medical History  Diagnosis Date  . Hyperlipidemia   . Acute systolic CHF (congestive heart failure), NYHA class 3 (HCC) 07/10/2014  . Hypertension   . Coronary artery disease   . Tracheostomy tube present (HCC)   . History of blood transfusion   . Status post insertion of percutaneous endoscopic gastrostomy (PEG) tube [facility](HCC)     Past Surgical History  Procedure Laterality Date  . Left heart catheterization with coronary angiogram N/A 07/13/2014    Procedure: LEFT HEART CATHETERIZATION WITH CORONARY ANGIOGRAM;  Surgeon: Corky CraftsJayadeep S Varanasi, MD; mLAD 95%, dLAD 95%, D1 95%, CFX severe dz, mRCA 80%  . Coronary artery bypass graft N/A 02/01/2015    Procedure: CORONARY ARTERY BYPASS GRAFTING (CABG) X 4 UTILIZING THE LEFT INTERNAL MAMMARY ARTERY TO LAD, ENDOSCOPICALLY HARVESTED BILATERAL SAPHENEOUS VEIN GRAFTS  TO DIAGONAL, OM AND PD.;  Surgeon: Kerin PernaPeter Van Trigt, MD;  Location: [facility] OR;  Service: Open Heart Surgery;  Laterality: N/A;  . Tee without cardioversion N/A  02/01/2015    Procedure: TRANSESOPHAGEAL ECHOCARDIOGRAM (TEE);  Surgeon: Kerin PernaPeter Van Trigt, MD;  [facility];  Service: Open Heart Surgery;  Laterality: N/A;  . Placement of centrimag ventricular assist device N/A 02/01/2015    Procedure: PLACEMENT OF CENTRIMAG VENTRICULAR ASSIST DEVICE;  Surgeon: Kerin PernaPeter Van Trigt, MD;  [facility];  Service: Open Heart Surgery;  Laterality: N/A;  . Removal of centrimag ventricular assist device N/A 02/05/2015    Procedure: REMOVAL OF CENTRIMAG VENTRICULAR ASSIST DEVICE;  Surgeon: Kerin PernaPeter Van Trigt, MD;  [facility];  Service: Open Heart Surgery;  Laterality: N/A;  . Cannulation for cardiopulmonary bypass N/A 02/05/2015    Procedure: CANNULATION FOR CARDIOPULMONARY BYPASS;  Surgeon: Kerin PernaPeter Van Trigt, MD;  [facility];  Service: Open Heart Surgery;  Laterality: N/A;  . Tee without cardioversion N/A 02/05/2015    Procedure: TRANSESOPHAGEAL ECHOCARDIOGRAM (TEE);  Surgeon: Kerin PernaPeter Van Trigt, MD;  [facility];  Service: Open Heart Surgery;  Laterality: N/A;  . Sternal closure N/A 02/08/2015    Procedure: STERNAL CLOSURE;  Surgeon: Kerin PernaPeter Van Trigt, MD;  [facility];  Service: Thoracic;  Laterality: N/A;  . Tee without cardioversion N/A 02/08/2015    Procedure: TRANSESOPHAGEAL ECHOCARDIOGRAM (TEE);  Surgeon: Kerin PernaPeter Van Trigt, MD;  [facility];  Service: Thoracic;  Laterality: N/A;  . Tubal ligation    . Laryngoscopy and bronchoscopy  N/A 04/20/2015    Procedure: Direct Laryngoscopy and Decannulation;  Surgeon: Melvenia Beam, MD;  Location: [facility] OR;  Service: ENT;  Laterality: N/A;  . Rigid bronchoscopy N/A 04/20/2015    Procedure: RIGID BRONCHOSCOPY;  Surgeon: Melvenia Beam, MD;  Location: [facility] OR;  Service: ENT;  Laterality: N/A;     Current Outpatient Prescriptions  Medication Sig Dispense Refill  . aspirin EC 81 MG tablet Take 81 mg by mouth daily.    . hydrALAZINE (APRESOLINE) 10 MG tablet Take 1 tablet (10 mg total) by mouth daily. 30 tablet 2  .  metoprolol tartrate (LOPRESSOR) 25 MG tablet Take 1 tablet (25 mg total) by mouth 2 (two) times daily. 60 tablet 2  . pravastatin (PRAVACHOL) 80 MG tablet Take 80 mg by mouth daily.    . rosuvastatin (CRESTOR) 40 MG tablet Take 1 tablet (40 mg total) by mouth daily. 90 tablet 3  . spironolactone (ALDACTONE) 25 MG tablet Take 0.5 tablets (12.5 mg total) by mouth daily. 45 tablet 3   No current facility-administered medications for this visit.    Allergies:   Atorvastatin    Social History:  The patient  reports that she has never smoked. She has never used smokeless tobacco. She reports that she does not drink alcohol or use illicit drugs.   Family History:  The patient's *family history includes Heart attack in her mother; Hypertension in her father and mother; Stroke in her father.     ASSESSMENT AND PLAN:  1.  Hyperlipidemia-  Pt's LDL decreased ~30% with max dose pravastatin but still significantly elevated.  She would benefit from changing to Crestor for a 50-60% reduction in LDL.  Pt is willing to change to Crestor; the only issue before was cost.  New Rx for Crestor  daily sent to pharmacy but it does require a PA.  Will complete PA and check the cost.  Pt will continue on pravastatin until Crestor is approved.   Edrick Oh [facility]  07/16/2015 9:38 AM    [facility] [address] [place], [place], [place]  [address] Phone: [phone]; Fax: [phone]

## 2015-08-24 ENCOUNTER — Other Ambulatory Visit: Payer: Self-pay | Admitting: Cardiothoracic Surgery

## 2015-08-27 ENCOUNTER — Telehealth: Payer: Self-pay | Admitting: Interventional Cardiology

## 2015-08-27 ENCOUNTER — Other Ambulatory Visit: Payer: Self-pay | Admitting: *Deleted

## 2015-08-27 MED ORDER — HYDRALAZINE HCL 10 MG PO TABS: 10.0000 mg | ORAL_TABLET | Freq: Every day | ORAL | Status: DC

## 2015-08-27 NOTE — Telephone Encounter (Signed)
°*  STAT* If patient is at the pharmacy, call can be transferred to refill team.   1. Which medications need to be refilled? (please list name of each medication and dose if known) Hydralazine 10mg   2. Which pharmacy/location (including street and city if local pharmacy) is medication to be sent to?[facility] [address]  3. Do they need a 30 day or 90 day supply? 90

## 2015-09-24 ENCOUNTER — Other Ambulatory Visit: Payer: Self-pay | Admitting: Interventional Cardiology

## 2015-10-25 ENCOUNTER — Other Ambulatory Visit: Payer: Self-pay | Admitting: Interventional Cardiology

## 2015-12-17 IMAGING — CR DG CHEST 1V PORT
1 series · 1 of 1 positions shown · non-contrast
Comparison: Portable chest x-ray March 08, 2015

CLINICAL DATA: CHF, shortness of breath, status post CABG on
February 01, 2015

EXAM:
PORTABLE CHEST 1 VIEW

[AP]
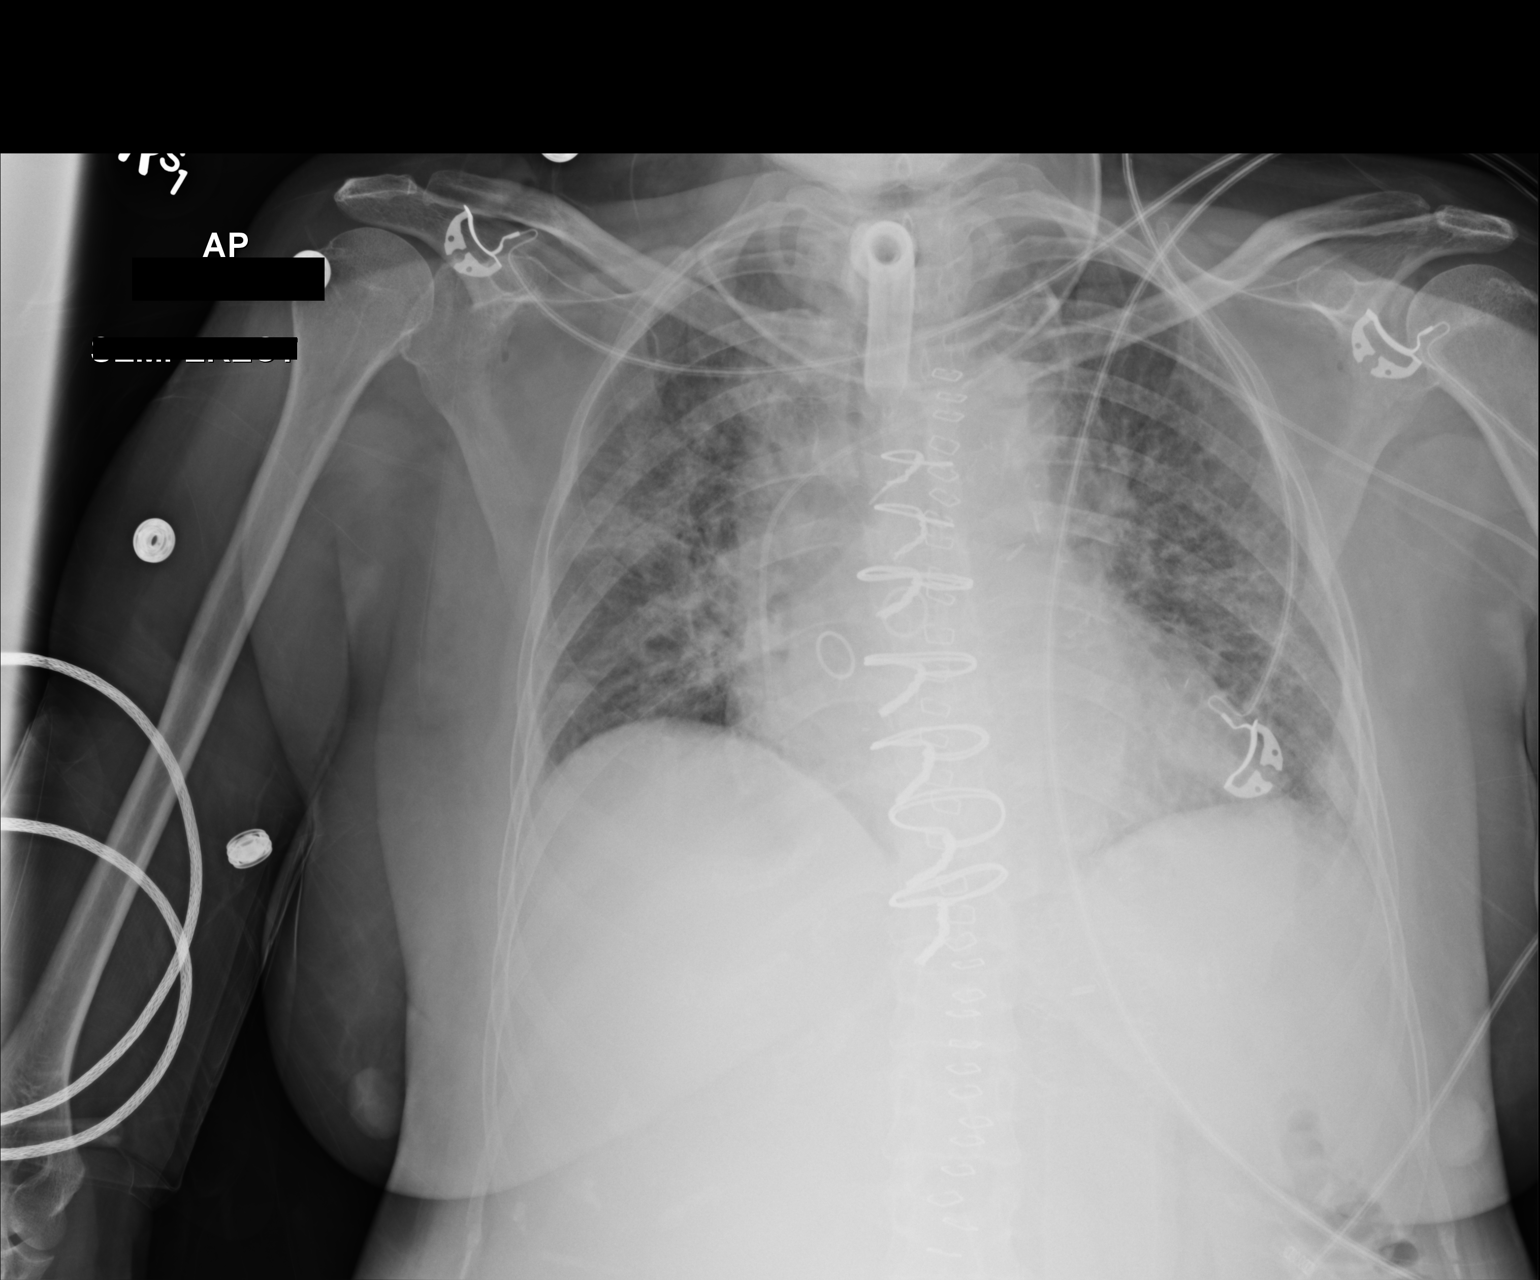

[1 of 1 positions shown; findings below may reference images not displayed]

FINDINGS: The lungs are mildly hypoinflated. The interstitial markings remain
increased bilaterally. Areas complaint put alveolar opacity persist
inferiorly in the right upper lobe. The cardiac silhouette remains
enlarged. The pulmonary vascularity is engorged and indistinct.
There are 7 intact sternal wires. The tracheostomy appliance tip
lies at the level of the inferior margin of the clavicular heads
approximately 2.8 cm above the carina. The left internal jugular
venous catheter tip projects over the junction of the middle and
distal portions of the SVC.
IMPRESSION: Mild hypoinflation today as compared to yesterday's study. The
pulmonary interstitial markings remain increased consistent with
interstitial edema. The support tubes are in reasonable position.

## 2015-12-18 IMAGING — RF DG SWALLOWING FUNCTION - NRPT [facility]
1 series · 18 of 24 positions shown · non-contrast
Comparison: none

[Series 1: run · 28 acquisitions, 18 frames shown]
[im 1/28]
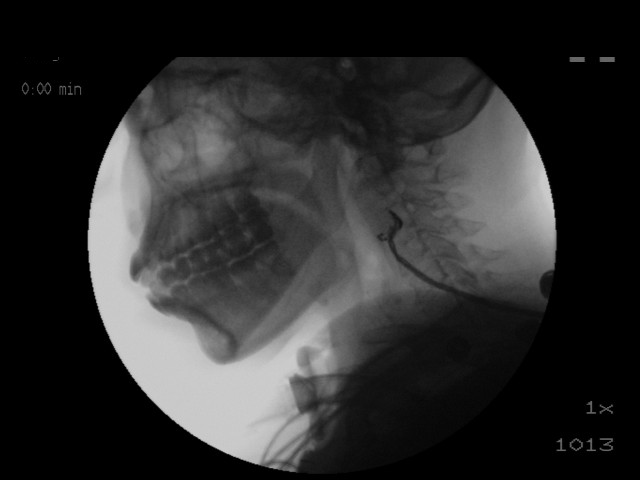
[im 3/28]
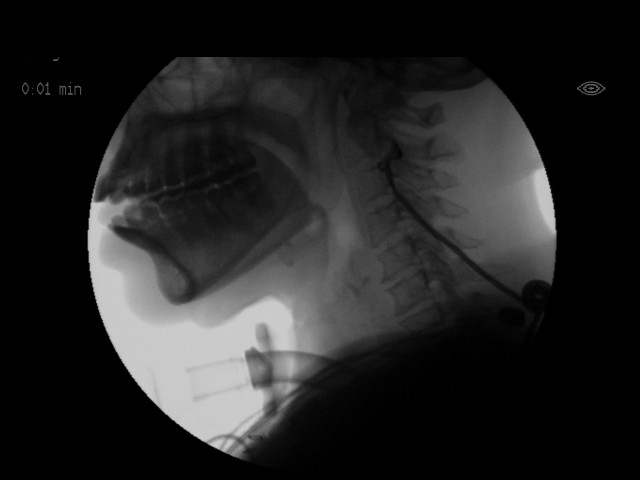
[im 4/28]
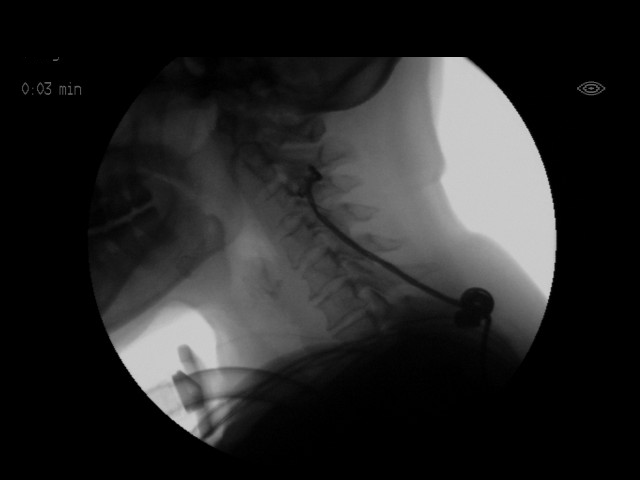
[im 5/28]
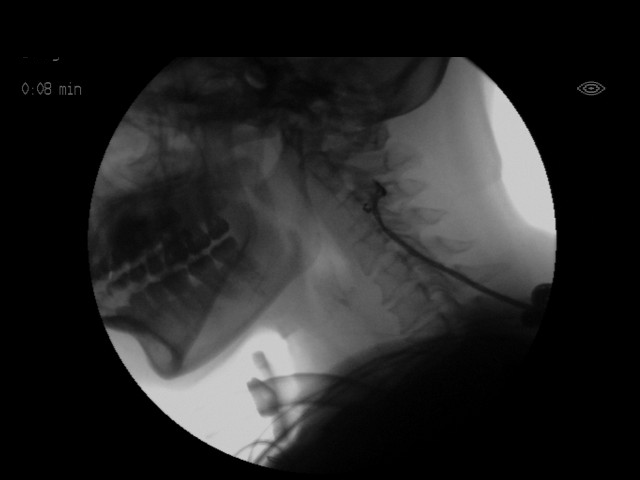
[im 8/28]
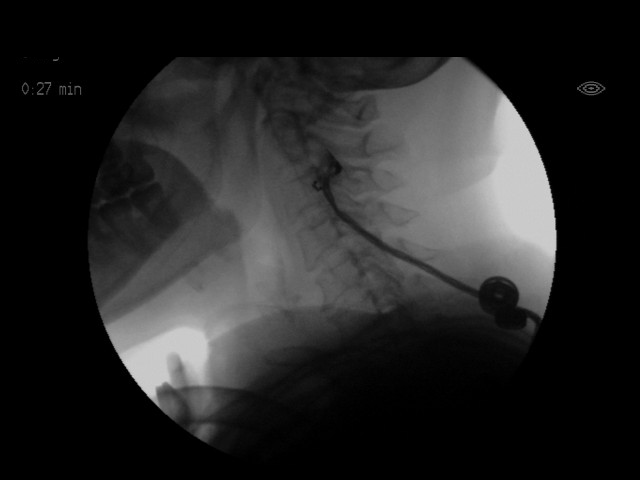
[im 9/28]
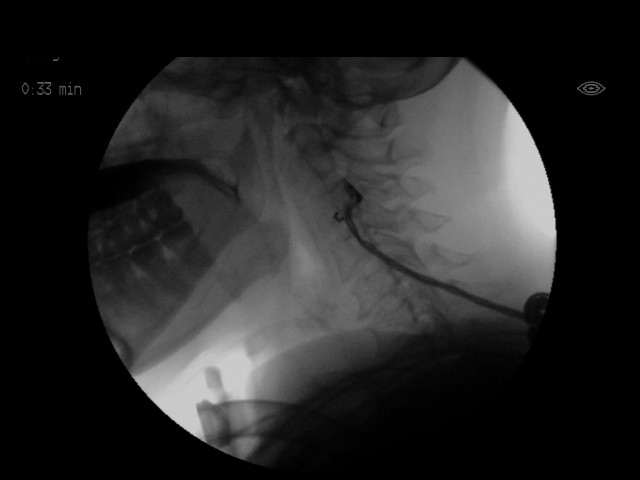
[im 10/28]
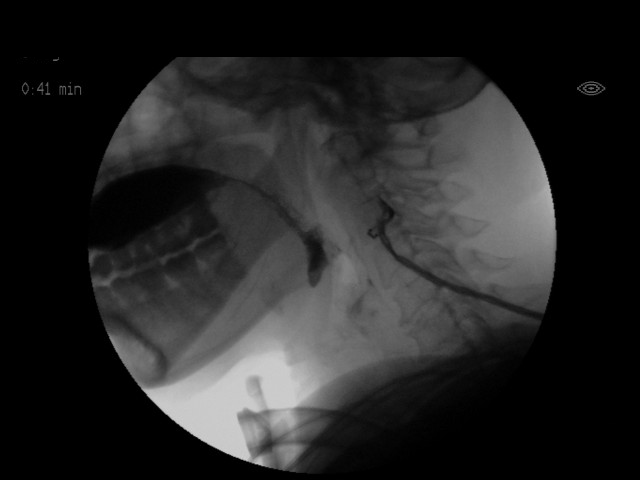
[im 12/28]
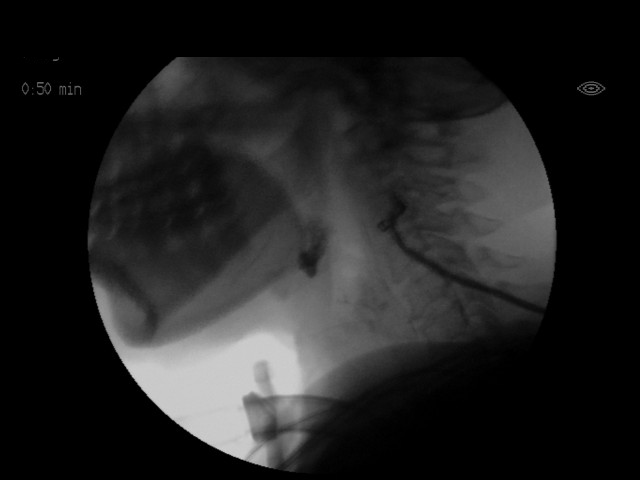
[im 13/28]
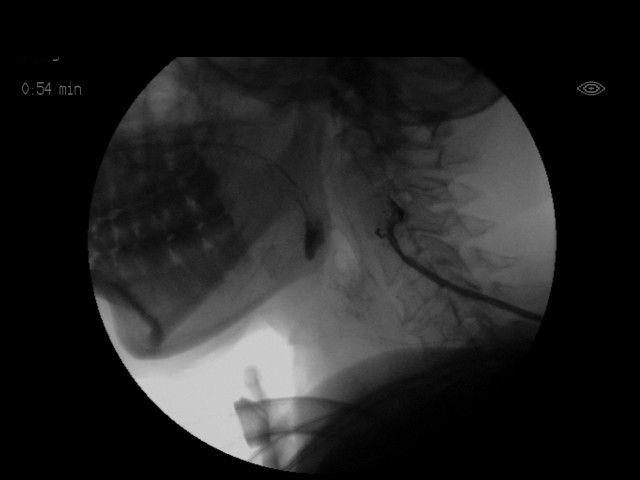
[im 15/28]
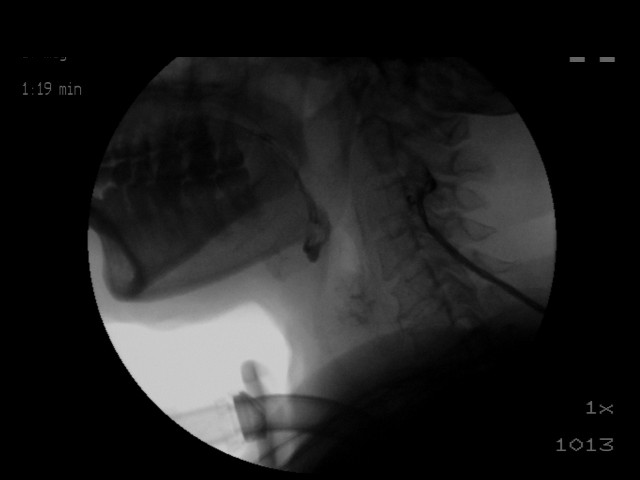
[im 17/28]
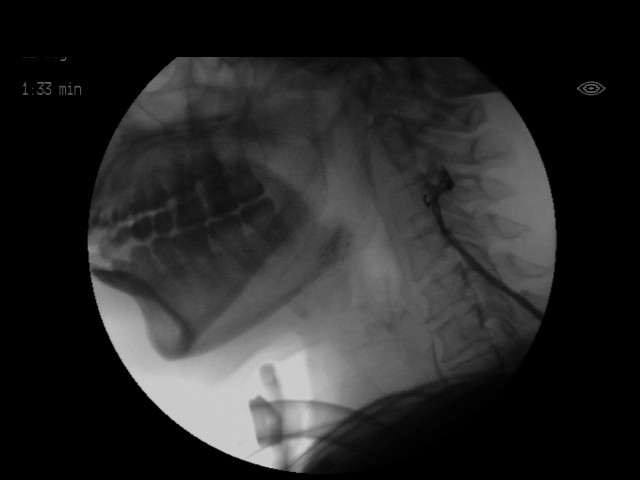
[im 18/28]
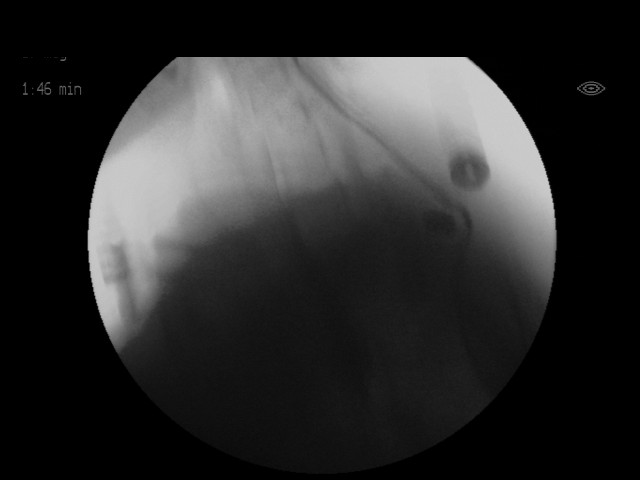
[im 19/28]
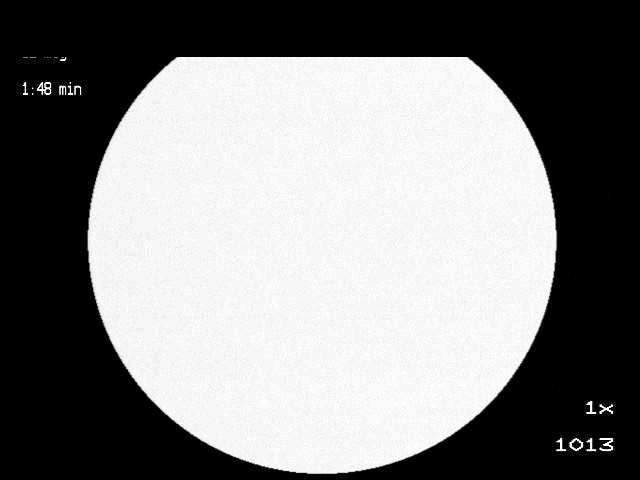
[im 22/28]
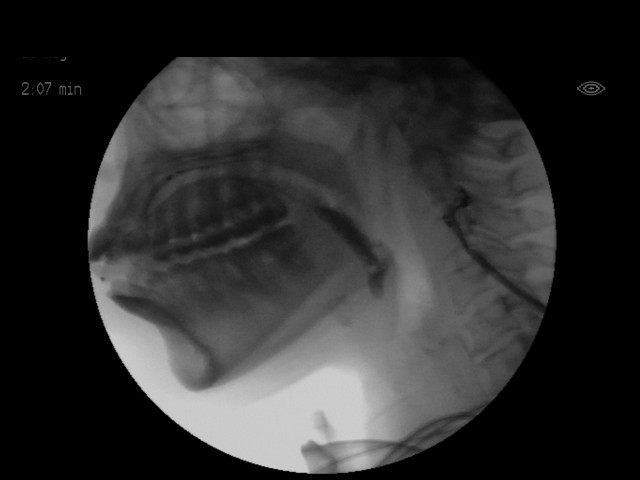
[im 23/28]
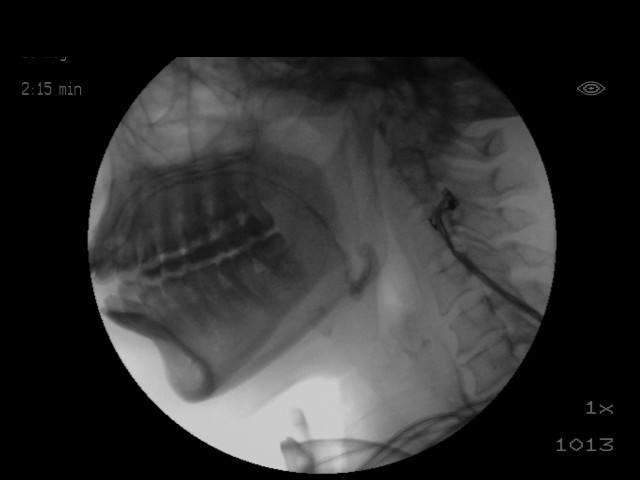
[im 24/28]
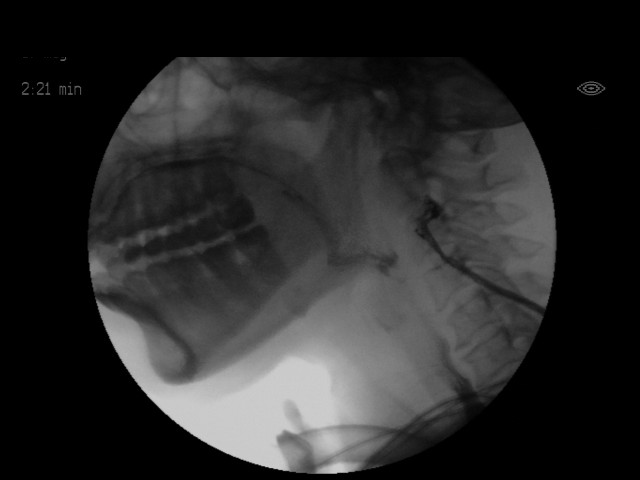
[im 26/28]
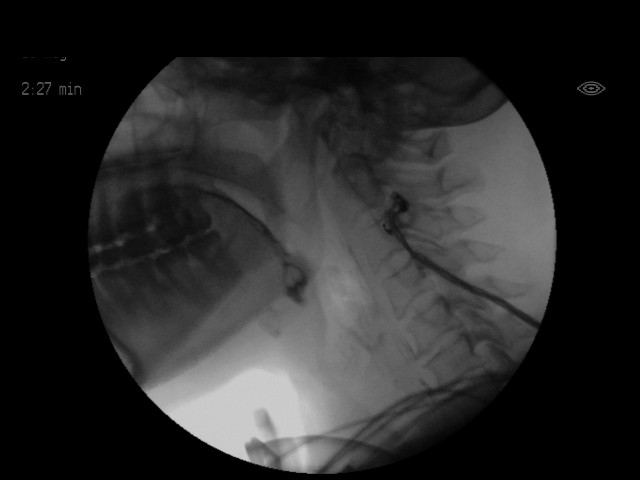
[im 28/28]
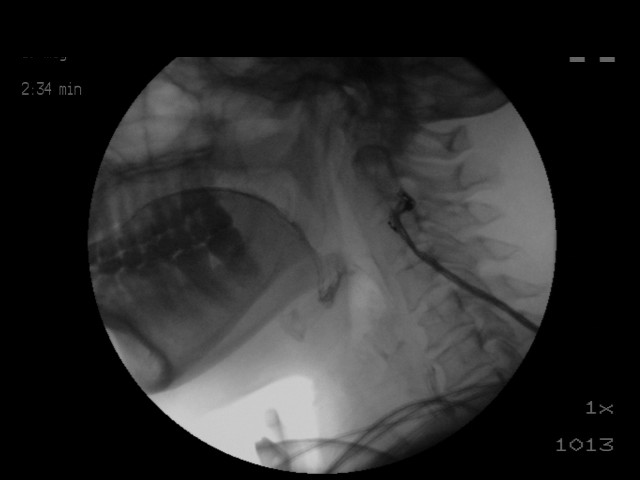

[18 of 24 positions shown; findings below may reference images not displayed]

FLUOROSCOPY FOR SWALLOWING FUNCTION STUDY:
Fluoroscopy was provided for swallowing function study, which was administered by a speech pathologist.  Final results and recommendations from this study are contained within the speech pathology report.

## 2016-01-11 ENCOUNTER — Encounter: Payer: Self-pay | Admitting: Interventional Cardiology

## 2016-01-11 ENCOUNTER — Ambulatory Visit (INDEPENDENT_AMBULATORY_CARE_PROVIDER_SITE_OTHER): Payer: Medicaid Other | Admitting: Interventional Cardiology

## 2016-01-11 VITALS — BP 150/70 | HR 70 | Ht 60.0 in | Wt 112.8 lb

## 2016-01-11 DIAGNOSIS — I2510 Atherosclerotic heart disease of native coronary artery without angina pectoris: Secondary | ICD-10-CM

## 2016-01-11 DIAGNOSIS — Z951 Presence of aortocoronary bypass graft: Secondary | ICD-10-CM

## 2016-01-11 DIAGNOSIS — I5022 Chronic systolic (congestive) heart failure: Secondary | ICD-10-CM | POA: Diagnosis not present

## 2016-01-11 DIAGNOSIS — M25511 Pain in right shoulder: Secondary | ICD-10-CM

## 2016-01-11 DIAGNOSIS — E785 Hyperlipidemia, unspecified: Secondary | ICD-10-CM | POA: Diagnosis not present

## 2016-01-11 DIAGNOSIS — I10 Essential (primary) hypertension: Secondary | ICD-10-CM | POA: Diagnosis not present

## 2016-01-11 NOTE — Patient Instructions (Signed)
**Note De-Identified  Obfuscation** Medication Instructions:  Same-no changes  Labwork: None  Testing/Procedures: Your physician has requested that you have an echocardiogram. Echocardiography is a painless test that uses sound waves to create images of your heart. It provides your doctor with information about the size and shape of your heart and how well your heart's chambers and valves are working. This procedure takes approximately one hour. There are no restrictions for this procedure.   Follow-Up: Your physician wants you to follow-up in: 9 months. You will receive a reminder letter in the mail two months in advance. If you don't receive a letter, please call our office to schedule the follow-up appointment.  Please contact Dr Marlan PalauMary Baxley at [phone] to see if she is accepting new patients.    If you need a refill on your cardiac medications before your next appointment, please call your pharmacy.

## 2016-01-11 NOTE — Progress Notes (Signed)
Patient ID: [name], female   DOB: 27-Aug-1950, 65 y.o.   MRN: [id number]     Cardiology Office Note   Date:  01/11/2016   ID:  Breanna Alvarez, DOB 11-19-1950, MRN [id number]  PCP:  No PCP Per Patient    No chief complaint on file. f/u CAD   Wt Readings from Last 3 Encounters:  01/11/16 112 lb 12.8 oz (51.2 kg)  07/01/15 95 lb 12.8 oz (43.5 kg)  05/19/15 95 lb (43.1 kg)       History of Present Illness: Breanna Alvarez is a 65 y.o. female  who ha she had a non-STEMI in early 2016. Cardiac cath showed, located bifurcation LAD disease. She had multivessel coronary artery disease.  She declined bypass surgery. She had progressive angina. In September 2016, she agreed to bypass surgery. She had a very complicated course, mostly due to LV dysfunction and respiratory issues including ARDS and aspiration. She is now in a rehabilitation facility. She had a trach placed.  She was stronger by Feb 2017 visit.   On todays visit, Overall, she is much stronger. She is in great spirits. She denies any chest discomfort. She walks a lot at home. Her blood pressures are typically well controlled with systolics in the 120-140 range. She has not had any low blood pressure readings.  She has continued with the losartan.   She has some tingling at her leg scars and upper sternotomy scar.  Pain with moving right shoulder.  Overall, she is pleased with the progress that she has made. She denies any palpitations or shortness of breath. No trouble lying flat. She is aware that her ejection fraction has improved significantly.    Past Medical History:  Diagnosis Date  . Acute systolic CHF (congestive heart failure), NYHA class 3 (HCC) 07/10/2014  . Coronary artery disease   . History of blood transfusion   . Hyperlipidemia   . Hypertension   . Status post insertion of percutaneous endoscopic gastrostomy (PEG) tube (HCC)   . Tracheostomy tube present [facility])     Past Surgical History:    Procedure Laterality Date  . CANNULATION FOR CARDIOPULMONARY BYPASS N/A 02/05/2015   Procedure: CANNULATION FOR CARDIOPULMONARY BYPASS;  Surgeon: Kerin Perna, MD;  Location: [facility] OR;  Service: Open Heart Surgery;  Laterality: N/A;  . CORONARY ARTERY BYPASS GRAFT N/A 02/01/2015   Procedure: CORONARY ARTERY BYPASS GRAFTING (CABG) X 4 UTILIZING THE LEFT INTERNAL MAMMARY ARTERY TO LAD, ENDOSCOPICALLY HARVESTED BILATERAL SAPHENEOUS VEIN GRAFTS  TO DIAGONAL, OM AND PD.;  Surgeon: Kerin Perna, MD;  Location: [facility] OR;  Service: Open Heart Surgery;  Laterality: N/A;  . LARYNGOSCOPY AND BRONCHOSCOPY N/A 04/20/2015   Procedure: Direct Laryngoscopy and Decannulation;  Surgeon: Melvenia Beam, MD;  [facility] OR;  Service: ENT;  Laterality: N/A;  . LEFT HEART CATHETERIZATION WITH CORONARY ANGIOGRAM N/A 07/13/2014   Procedure: LEFT HEART CATHETERIZATION WITH CORONARY ANGIOGRAM;  Surgeon: Corky Crafts, MD; mLAD 95%, dLAD 95%, D1 95%, CFX severe dz, mRCA 80%  . PLACEMENT OF CENTRIMAG VENTRICULAR ASSIST DEVICE N/A 02/01/2015   Procedure: PLACEMENT OF CENTRIMAG VENTRICULAR ASSIST DEVICE;  Surgeon: Kerin Perna, MD;  Location: [facility] OR;  Service: Open Heart Surgery;  Laterality: N/A;  . REMOVAL OF CENTRIMAG VENTRICULAR ASSIST DEVICE N/A 02/05/2015   Procedure: REMOVAL OF CENTRIMAG VENTRICULAR ASSIST DEVICE;  Surgeon: Kerin Perna, MD;  Location: [facility] OR;  Service: Open Heart Surgery;  Laterality: N/A;  . RIGID BRONCHOSCOPY N/A 04/20/2015  Procedure: RIGID BRONCHOSCOPY;  Surgeon: Melvenia Beam, MD;  Location: [facility] OR;  Service: ENT;  Laterality: N/A;  . STERNAL CLOSURE N/A 02/08/2015   Procedure: STERNAL CLOSURE;  Surgeon: Kerin Perna, MD;  Location: [facility] OR;  Service: Thoracic;  Laterality: N/A;  . TEE WITHOUT CARDIOVERSION N/A 02/01/2015   Procedure: TRANSESOPHAGEAL ECHOCARDIOGRAM (TEE);  Surgeon: Kerin Perna, MD;  Location: [facility] OR;  Service: Open Heart Surgery;  Laterality: N/A;  . TEE WITHOUT  CARDIOVERSION N/A 02/05/2015   Procedure: TRANSESOPHAGEAL ECHOCARDIOGRAM (TEE);  Surgeon: Kerin Perna, MD;  Location: [facility] OR;  Service: Open Heart Surgery;  Laterality: N/A;  . TEE WITHOUT CARDIOVERSION N/A 02/08/2015   Procedure: TRANSESOPHAGEAL ECHOCARDIOGRAM (TEE);  Surgeon: Kerin Perna, MD;  Location: [facility] OR;  Service: Thoracic;  Laterality: N/A;  . TUBAL LIGATION       Current Outpatient Prescriptions  Medication Sig Dispense Refill  . aspirin EC 81 MG tablet Take 81 mg by mouth daily.    . hydrALAZINE (APRESOLINE) 10 MG tablet Take 1 tablet (10 mg total) by mouth daily. 90 tablet 3  . losartan (COZAAR) 25 MG tablet TAKE ONE TABLET BY MOUTH ONCE DAILY 90 tablet 1  . metoprolol tartrate (LOPRESSOR) 25 MG tablet TAKE ONE TABLET BY MOUTH TWICE DAILY 180 tablet 1  . pravastatin (PRAVACHOL) 80 MG tablet Take 80 mg by mouth daily.    . rosuvastatin (CRESTOR) 40 MG tablet Take 1 tablet (40 mg total) by mouth daily. 90 tablet 3  . spironolactone (ALDACTONE) 25 MG tablet Take 0.5 tablets (12.5 mg total) by mouth daily. 45 tablet 3   No current facility-administered medications for this visit.     Allergies:   Atorvastatin    Social History:  The patient  reports that she has never smoked. She has never used smokeless tobacco. She reports that she does not drink alcohol or use drugs.   Family History:  The patient's family history includes Heart attack in her mother; Hypertension in her father and mother; Stroke in her father.    ROS:  Please see the history of present illness.   Otherwise, review of systems are positive for pain at upper sternotomy scar and right shoulder pain.   All other systems are reviewed and negative.    PHYSICAL EXAM: VS:  BP (!) 150/70   Pulse 70   Ht 5' (1.524 m)   Wt 112 lb 12.8 oz (51.2 kg)   BMI 22.03 kg/m  , BMI Body mass index is 22.03 kg/m. GEN: Well nourished, well developed, in no acute distress  HEENT: tracheostomy site repaired  Neck: no  JVD, carotid bruits, or masses Cardiac: RRR; no murmurs, rubs, or gallops,no edema  Respiratory:Coarse breath sounds to auscultation bilaterally, normal work of breathing GI: soft, nontender, nondistended, + BS MS: no deformity or atrophy , pain with movement of right shoulder Skin: warm and dry, no rash Neuro:  Strength and sensation are intact Psych: euthymic mood, full affect     Recent Labs: 03/08/2015: Magnesium 2.2 04/16/2015: Hemoglobin 10.7; Platelets 336 07/09/2015: ALT 13; BUN 30; Creat 1.08; Potassium 4.5; Sodium 141   Lipid Panel    Component Value Date/Time   CHOL 261 (H) 07/09/2015 0825   TRIG 146 07/09/2015 0825   HDL 30 (L) 07/09/2015 0825   CHOLHDL 8.7 (H) 07/09/2015 0825   VLDL 29 07/09/2015 0825   LDLCALC 202 (H) 07/09/2015 0825     Other studies Reviewed: Additional studies/ records that were reviewed today  with results demonstrating: Hospital discharge summary with findings noted above.   ASSESSMENT AND PLAN:  1. Ischemic cardiomyopathy/coronary artery disease/chronic systolic heart failure: Ejection fraction was 25-30%. Status post bypass surgery. Much improved LVEF: 35-40%.  No angina. Continue losartan 25 mg daily.  Continue the hydralazine and spironolactone. Continue beta blocker.  Appears euvolemic.   Recheck echo in a few months (Jan 2018) as she has been taking her meds.  2. Tracheostomy: Removed and repaired 3. Hyperlipidemia: She is tolerating pravastatin 80 mg daily. She was intolerant of atorvastatin and Crestor. Ideally, LDL would be around 70. Given her LDL of 179 in September, it is unlikely that pravastatin will get her all the way down to target.  Crestor started.  LDL above target at 202.  4. Hypertension: BP much better at home.  High in the MD office.  COntinue to watch BP.   5. I filled out an insurance form for her at the last visit.  THey brought another form asking for another 6 months of disability coverage for her, to the medical  records staff.  6. Chest pain: Occurs at a spot at her sternotomy scar.  There is something palpable there, I think it is a sternal tie.  7. Right shoulder pain, musculoskeletal.  Refer to PMD.     Current medicines are reviewed at length with the patient today.  The patient concerns regarding her medicines were addressed.  The following changes have been made:    Labs/ tests ordered today include:  No orders of the defined types were placed in this encounter.   Recommend 150 minutes/week of aerobic exercise Low fat, low carb, high fiber diet recommended  Disposition:   FU in 6 months   Signed, Lance MussJayadeep Gianna Calef, MD  01/11/2016 11:15 AM    [facility] [address], [place], [place]  [address] Phone: [phone]; Fax: [phone]

## 2016-01-13 ENCOUNTER — Other Ambulatory Visit: Payer: Self-pay | Admitting: Interventional Cardiology

## 2016-01-14 IMAGING — RF DG SWALLOWING FUNCTION - NRPT [facility]
1 series · 18 of 24 positions shown · non-contrast
Comparison: none

[Series 1: run · 15 acquisitions, 18 frames shown]
[im 1/15]
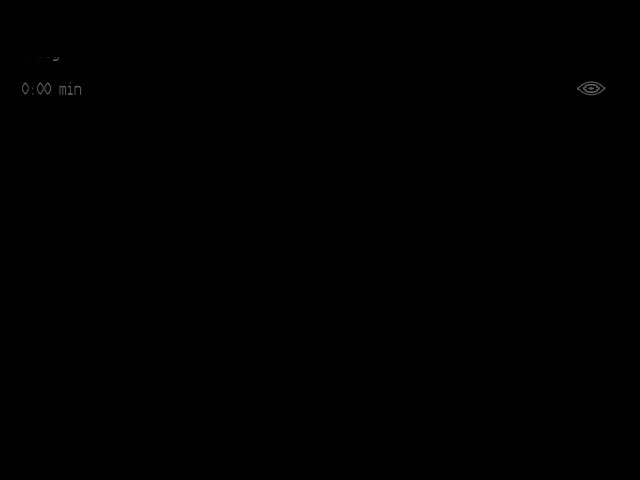
[im 2/15]
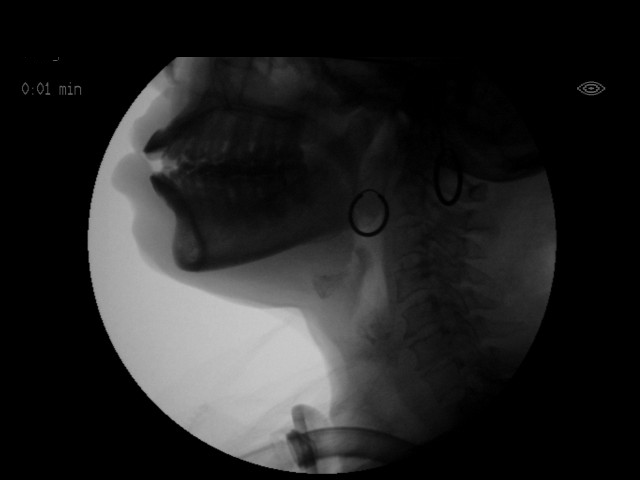
[im 3/15]
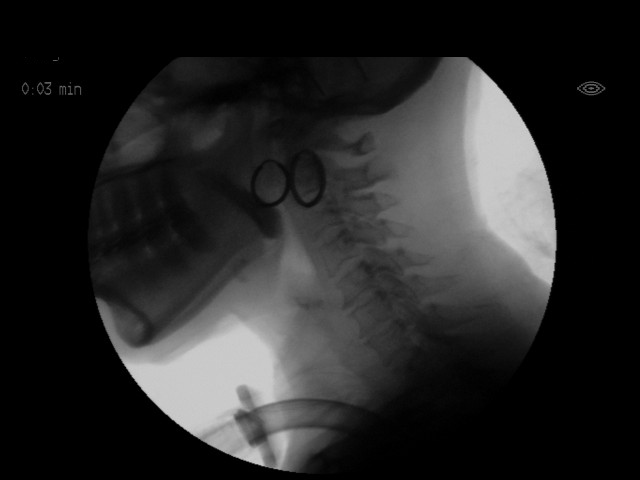
[im 3/15]
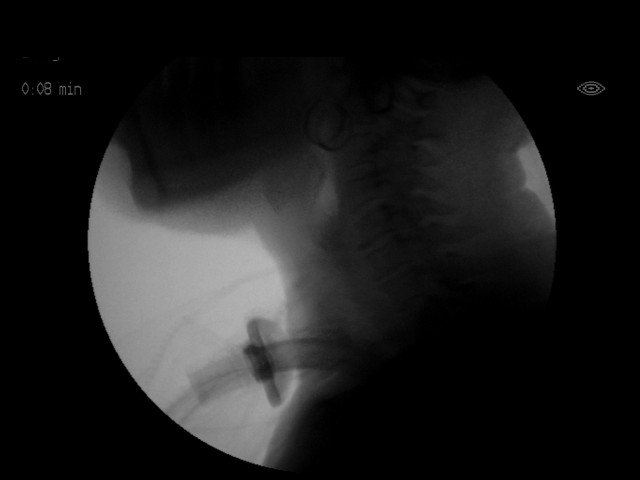
[im 5/15]
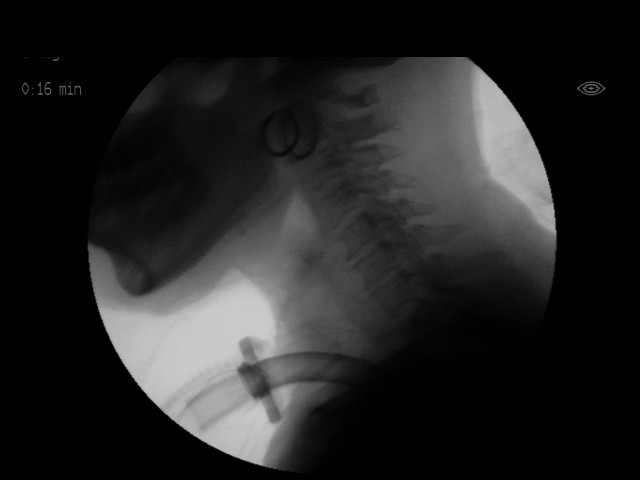
[im 5/15]
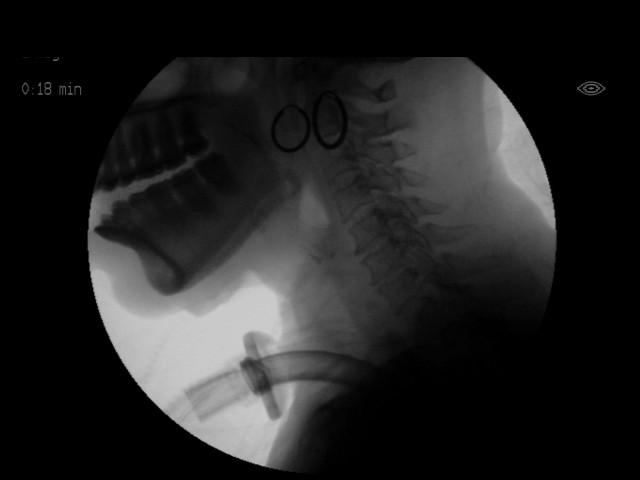
[im 6/15]
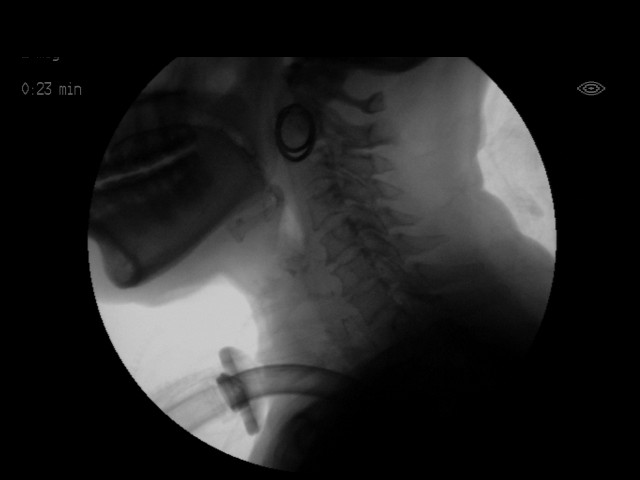
[im 7/15]
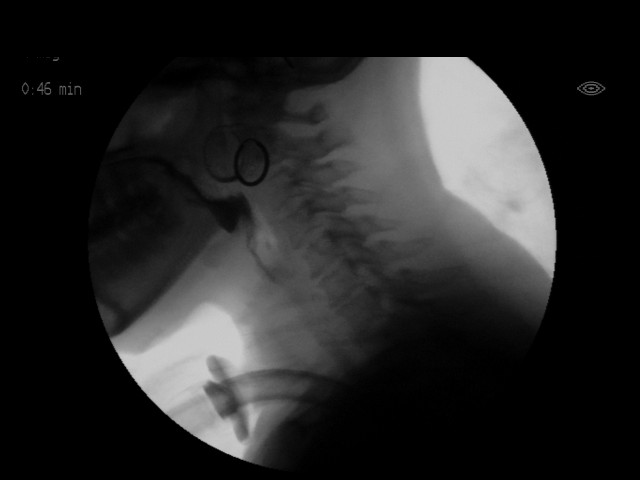
[im 8/15]
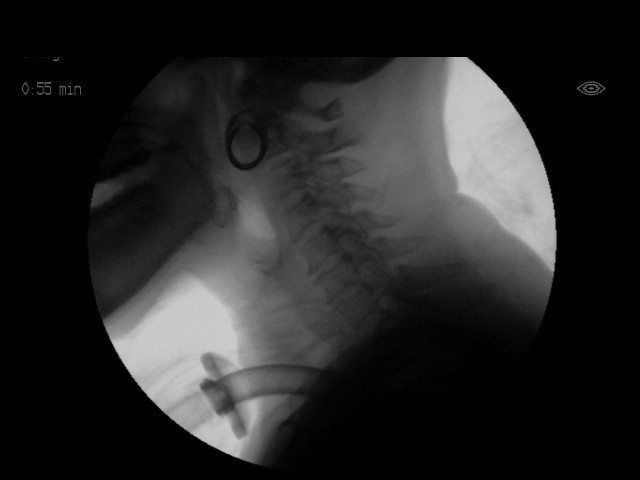
[im 8/15]
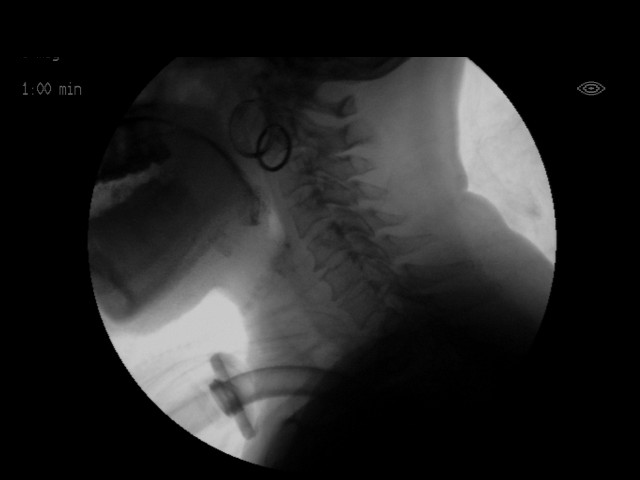
[im 10/15]
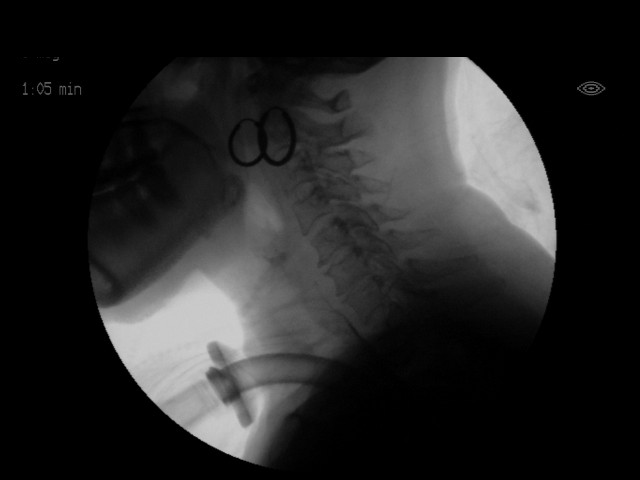
[im 10/15]
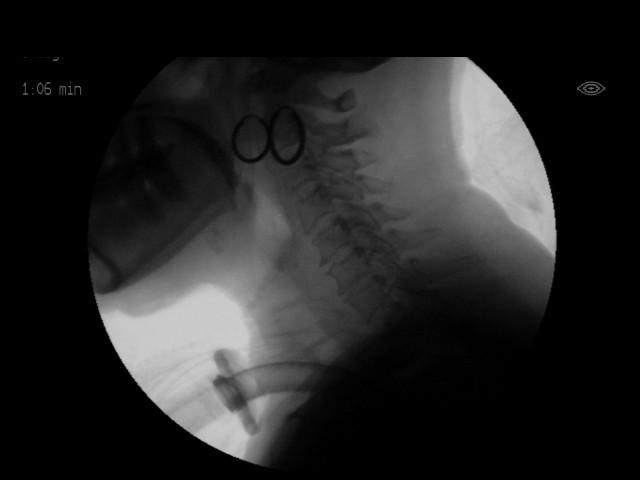
[im 11/15]
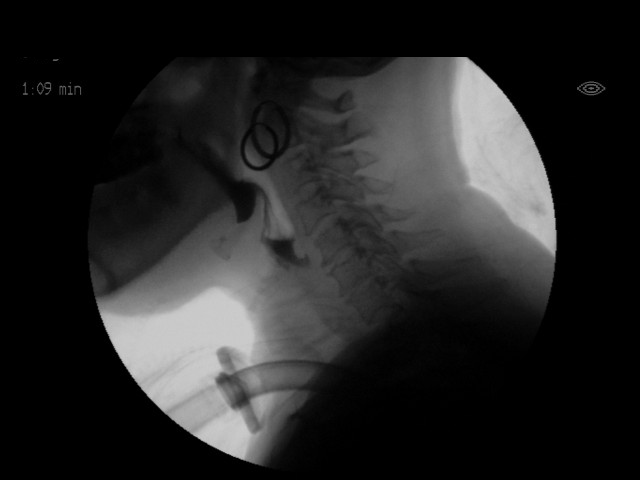
[im 12/15]
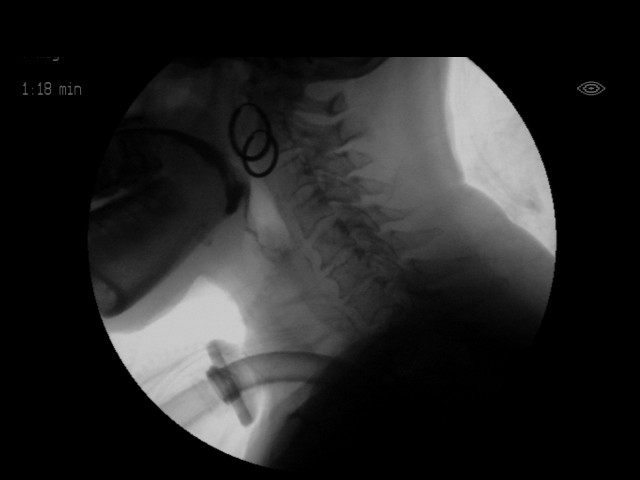
[im 13/15]
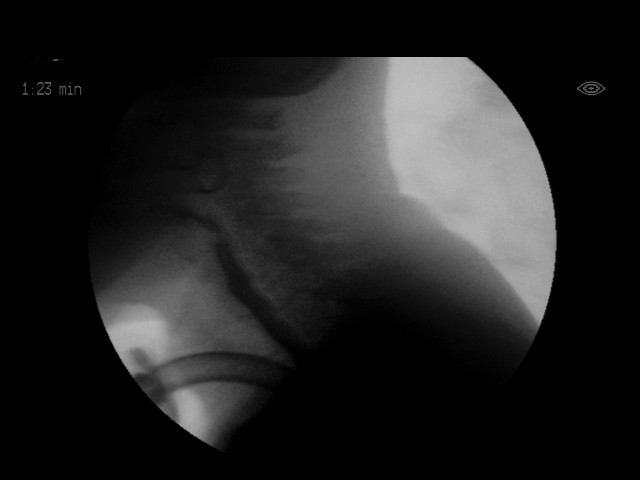
[im 13/15]
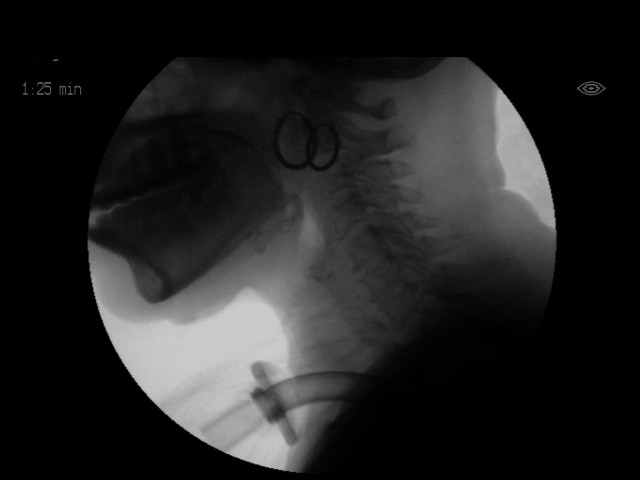
[im 15/15]
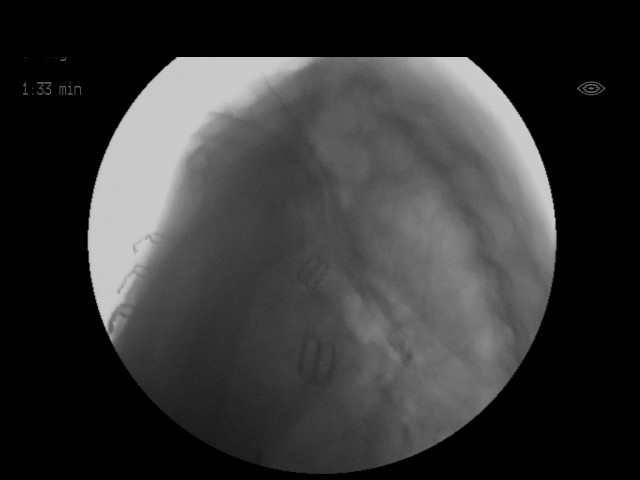
[im 15/15]
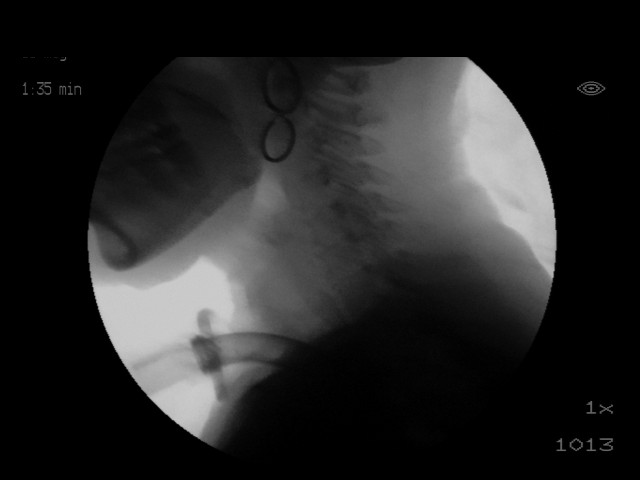

[18 of 24 positions shown; findings below may reference images not displayed]

FLUOROSCOPY FOR SWALLOWING FUNCTION STUDY:
Fluoroscopy was provided for swallowing function study, which was administered by a speech pathologist.  Final results and recommendations from this study are contained within the speech pathology report.

## 2016-01-20 ENCOUNTER — Telehealth: Payer: Self-pay

## 2016-01-20 NOTE — Telephone Encounter (Addendum)
**Note De-Identified  Obfuscation** The pt had an OV with Dr Eldridge DaceVaranasi on 9/5 and brought in a Disability claim form from [employer] for Dr Eldridge DaceVaranasi to fill out. Shanda BumpsJessica, from medical records, came and spoke with the pt in a pts room concerning the paperwork from [employer] company.  The paperwork came back from medical records and was given to Dr Eldridge DaceVaranasi for his signature this morning. Once he read the paperwork Dr Eldridge DaceVaranasi asked me to call the pt and let her know that she is ok to work.  I called the pt and she stated that she cant work after her heart attack because she cant perform the job she had prior to her heart attack. 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  I advised her that Dr Eldridge DaceVaranasi is unaware that she has not been working and that he feels she is ok to go back to work.  She stated "now you will have to talk to my husband because I am upset that you say I can work".  The pts husband got on the phone and stated "I asked Dr Eldridge DaceVaranasi to his face last week when we were there if he was [place] write a letter stating that she can work knowing that it will put his reputation on the line if she died at work".   I advised the husband that I have questions concerning the disability paperwork that they left with medical records. He cut me off while I was speaking and said that it is not disability paperwork X 3 and stated that he has already been through this with us the last time they left paperwork here.  I advised him that since they did not go through medical records the last time they brought paperwork in (they asked Dr Eldridge DaceVaranasi to fill it out while they were here in the office for an OV) that that paperwork was never  scanned into the pts chart so I cant review forms to see what they were about and I was unaware that he had any problems with that paperwork.  The husband then stated "I know Damn well that Dr Eldridge DaceVaranasi" is not going to put his job on the line like that". I asked him not to cuss but he continued to talk and cuss over me so I hung up.  I was on the phone for more than 25 mins with the pt and her husband.     While I was completing this phone message Selena BattenKim, from medical records, came and advised me that the pt and her husband are in the front office complaining about the paperwork that they left for Dr Eldridge DaceVaranasi to sign. Selena BattenKim and I spoke with Dr Eldridge DaceVaranasi on the phone who reiterated that the pt is ok to work. After speaking with Dr Lemar LivingsVaranasi Kim went back to the front office to talk with the pt and her husband.

## 2016-01-21 ENCOUNTER — Telehealth: Payer: Self-pay | Admitting: Interventional Cardiology

## 2016-01-21 NOTE — Telephone Encounter (Signed)
Patient and her husband came in the office today to pick up [name] papers. Patient began to tell me she needed a RTW note I explained to her again Dr.Varanasi was not in the office today he will be back next week. She then begin to tell me she cannot go back to work " at all" due to her Heart condition. I let patient know this was not my decision this would be a decision for our provider.   Patient's Husband then made the comment he should get his cell phone out and record all the conversations that he has in this office because someone is going to send her back to work when she is not ready and it will be on this office. He then gets his cell phone out dials a number talks with someone and makes the comment " I have this on record" he turns his cell phone around to me while he has it up in the air holding it over his head.  I tell the patient to have a seat and walk away from the front desk and go Speak with Wyline CopasAngel White the Director of Cardiology make her aware of what is going on with this patient.

## 2016-01-21 NOTE — Telephone Encounter (Signed)
Called and spoke with patient-she is aware Dr.Varanasi will not be signing [employer] Disability Claim Form. Patient has asked for a RTW note I told patient this will need to come from Dr.Varanasi I spoke with Mariea ClontsLynn V made her aware of what patient is requesting. Dr.Varanasi is not in the office today. Patient stated she will come pick up form today. I also made her aware I cannot get check she paid with until later this afternoon. Patient understood.

## 2016-01-21 NOTE — Telephone Encounter (Signed)
Patient came in yesterday ( 01/20/16) with her husband and told me this form was for a Loan she and her husband took out and they had insurance put on this loan and this is what this [employer] paper was. I explained to patient I would have to make some phone calls one would be to [employer] so I could get a better understanding of what this paper is. Patient understood. The whole time at front desk patients husband was over talking her and telling me no one here understands what's going on and she cannot go back to work. And Dr.Varanasi do not want to " put his job on th line" for sending her back to work does he.  I explained to patient once again I will need to follow up on this paper and I will call her back.   Called and spoke with Johnny BridgeMartha at [employer] this am [phone]  confirmed with her the paperwork that was dropped off by patient is for the coverage of her being in the hospital. Johnny BridgeMartha stated yes this is for coverage. And once she is released by physician the coverage will stop. I confirmed with Johnny BridgeMartha this is not a [employer] paper she stated she is not sure what that is.    I went and spoke with Larita FifeLynn Via explained to her what Johnny BridgeMartha with [employer] told me and exactly what the [employer] Disability Claim Form paper was for. Also made [facility] aware I will call patient and make here aware she can come pick up paperwork Dr.Varanasi will not be completing it.

## 2016-01-24 ENCOUNTER — Telehealth: Payer: Self-pay | Admitting: Interventional Cardiology

## 2016-01-24 NOTE — Telephone Encounter (Signed)
Spoke with patient she is aware [employer] paper has been completed and signed by Dr.Varanasi. She asked for a RTW Note. I spoke with Dr.Varanasi about this he stated he will check with Lawanna KobusAngel first on this. Patient is aware

## 2016-01-25 NOTE — Telephone Encounter (Signed)
The patient has improved over the past few visits.  She has no cardiac complaints.  The patient and husband were happy to lengthen the time before  The next visit because she was doing so well.  I do not feel that from a cardiac standpoint that I need to limit her ability to work.    I received communication from [place]pere NorthLynn Via that they were upset and heard about their behavior and what was said.  Larita FifeLynn reports in the chart : "The pts husband got on the phone and stated "I asked Dr Eldridge DaceVaranasi to his face last week when we were there if he was [place] write a letter stating that she can work knowing that it will put his reputation on the line if she died at work."   I can state for a fact that no one ever said this to me.  We did not speak about her returning to work at this visit in 9/17.  At the prior visit in the first half of 2017, I explained that I would extend her disability period on the form for 6 months, not "indefinitely" as they requested, since I though she could eventually work if she felt better.

## 2016-01-31 ENCOUNTER — Ambulatory Visit: Payer: Medicaid Other | Admitting: Family Medicine

## 2016-02-02 ENCOUNTER — Other Ambulatory Visit: Payer: Self-pay | Admitting: Internal Medicine

## 2016-02-02 DIAGNOSIS — Z1231 Encounter for screening mammogram for malignant neoplasm of breast: Secondary | ICD-10-CM

## 2016-02-15 ENCOUNTER — Ambulatory Visit: Payer: Medicaid Other

## 2016-02-25 ENCOUNTER — Ambulatory Visit: Payer: Medicaid Other

## 2016-03-01 ENCOUNTER — Other Ambulatory Visit: Payer: Self-pay | Admitting: Interventional Cardiology

## 2016-03-01 MED ORDER — LOSARTAN POTASSIUM 25 MG PO TABS: 25.0000 mg | ORAL_TABLET | Freq: Every day | ORAL | 3 refills | Status: AC

## 2016-03-06 ENCOUNTER — Telehealth: Payer: Self-pay

## 2016-03-06 NOTE — Telephone Encounter (Signed)
Prior auth for Losartan 25mg  obtained from [employer]. PA # [id number], is good for 1 year.

## 2016-03-09 ENCOUNTER — Ambulatory Visit
Admission: RE | Admit: 2016-03-09 | Discharge: 2016-03-09 | Disposition: A | Discharge status: Home / Self Care | Payer: Medicaid Other | Source: Ambulatory Visit | Attending: Internal Medicine | Admitting: Internal Medicine

## 2016-03-09 DIAGNOSIS — Z1231 Encounter for screening mammogram for malignant neoplasm of breast: Secondary | ICD-10-CM

## 2016-04-24 ENCOUNTER — Other Ambulatory Visit: Payer: Self-pay | Admitting: Interventional Cardiology

## 2016-05-22 ENCOUNTER — Other Ambulatory Visit (HOSPITAL_COMMUNITY): Payer: Medicaid Other

## 2016-05-30 ENCOUNTER — Other Ambulatory Visit: Payer: Self-pay | Admitting: Cardiology

## 2016-05-30 NOTE — Telephone Encounter (Signed)
REFILL 

## 2016-05-31 ENCOUNTER — Telehealth (HOSPITAL_COMMUNITY): Payer: Self-pay | Admitting: Radiology

## 2016-05-31 ENCOUNTER — Telehealth (HOSPITAL_COMMUNITY): Payer: Self-pay | Admitting: Interventional Cardiology

## 2016-05-31 NOTE — Telephone Encounter (Signed)
Pt called in and said that she has another cardiologist and no longer needs the echo appointment.

## 2016-05-31 NOTE — Telephone Encounter (Signed)
called to schedule echocardiogram 

## 2016-10-13 ENCOUNTER — Other Ambulatory Visit: Payer: Self-pay | Admitting: Interventional Cardiology

## 2016-12-27 ENCOUNTER — Other Ambulatory Visit: Payer: Self-pay | Admitting: Interventional Cardiology

## 2017-02-19 ENCOUNTER — Other Ambulatory Visit: Payer: Self-pay | Admitting: Interventional Cardiology

## 2017-02-21 NOTE — Telephone Encounter (Signed)
Please advise as patient has two different statins on her current med list. Also, per 05/31/16 patient called and stated that she has another cardiologist. Thanks, MI

## 2017-02-21 NOTE — Telephone Encounter (Signed)
Please defer to new cardiologist or PCP.

## 2017-02-23 ENCOUNTER — Other Ambulatory Visit: Payer: Self-pay | Admitting: Interventional Cardiology

## 2017-02-23 NOTE — Telephone Encounter (Signed)
Pt needs to call office and schedule appointment for further refills [phone] *2nd attempt*

## 2017-03-20 ENCOUNTER — Other Ambulatory Visit: Payer: Self-pay | Admitting: Interventional Cardiology

## 2022-01-07 ENCOUNTER — Other Ambulatory Visit: Payer: Self-pay

## 2022-01-07 ENCOUNTER — Encounter (HOSPITAL_BASED_OUTPATIENT_CLINIC_OR_DEPARTMENT_OTHER): Payer: Self-pay | Admitting: Emergency Medicine

## 2022-01-07 ENCOUNTER — Emergency Department (HOSPITAL_BASED_OUTPATIENT_CLINIC_OR_DEPARTMENT_OTHER)
Admission: EM | Admit: 2022-01-07 | Discharge: 2022-01-07 | Disposition: A | Discharge status: Home / Self Care | Payer: Medicare HMO | Attending: Emergency Medicine | Admitting: Emergency Medicine

## 2022-01-07 DIAGNOSIS — Z7982 Long term (current) use of aspirin: Secondary | ICD-10-CM | POA: Diagnosis not present

## 2022-01-07 DIAGNOSIS — Z79899 Other long term (current) drug therapy: Secondary | ICD-10-CM | POA: Diagnosis not present

## 2022-01-07 DIAGNOSIS — K0889 Other specified disorders of teeth and supporting structures: Secondary | ICD-10-CM | POA: Diagnosis present

## 2022-01-07 DIAGNOSIS — I110 Hypertensive heart disease with heart failure: Secondary | ICD-10-CM | POA: Diagnosis not present

## 2022-01-07 DIAGNOSIS — I2510 Atherosclerotic heart disease of native coronary artery without angina pectoris: Secondary | ICD-10-CM | POA: Insufficient documentation

## 2022-01-07 DIAGNOSIS — I5021 Acute systolic (congestive) heart failure: Secondary | ICD-10-CM | POA: Insufficient documentation

## 2022-01-07 MED ORDER — AMOXICILLIN 500 MG PO CAPS: 500.0000 mg | ORAL_CAPSULE | Freq: Three times a day (TID) | ORAL | 0 refills | Status: DC

## 2022-01-07 MED ORDER — TRAMADOL HCL 50 MG PO TABS: 50.0000 mg | ORAL_TABLET | Freq: Four times a day (QID) | ORAL | 0 refills | Status: DC | PRN

## 2022-01-07 NOTE — ED Provider Notes (Signed)
[facility] EMERGENCY DEPARTMENT Provider Note   CSN: [id number] Arrival date & time: 01/07/22  1636     History  Chief Complaint  Patient presents with   Dental Pain    Breanna Alvarez is a 71 y.o. female.  71 year old female with past medical history of CAD, CHF, hypertension, hyperlipidemia presents today for evaluation of right-sided dental pain going on for 2 days.  Some relief with DayQuil that she took yesterday.  Significant relief with Orajel that she was used just prior to arrival.  No swelling.  Does not routinely seek dental care.  She denies fever, facial swelling, or drainage.  She is without chest pain or shortness of breath.  The history is provided by the patient. No language interpreter was used.       Home Medications Prior to Admission medications   Medication Sig Start Date End Date Taking? Authorizing Provider  aspirin EC 81 MG tablet Take 81 mg by mouth daily.    [provider]  hydrALAZINE (APRESOLINE) 10 MG tablet TAKE ONE TABLET BY MOUTH ONCE DAILY 10/13/16   Corky Crafts, MD  losartan (COZAAR) 25 MG tablet Take 1 tablet (25 mg total) by mouth daily. 03/01/16   Corky Crafts, MD  metoprolol tartrate (LOPRESSOR) 25 MG tablet Take one tablet by mouth twice daily. Please make overdue yearly appt with Dr. Eldridge Dace before anymore refills. 1st attempt 03/20/17   Corky Crafts, MD  pravastatin (PRAVACHOL) 80 MG tablet Take 80 mg by mouth daily.    [provider]  pravastatin (PRAVACHOL) 80 MG tablet TAKE ONE TABLET BY MOUTH ONCE DAILY IN THE EVENING 01/13/16   Corky Crafts, MD  rosuvastatin (CRESTOR) 40 MG tablet TAKE 1 TABLET BY MOUTH ONCE DAILY. MUST HAVE OFFICE VISIT BEFORE FURTHER REFILLS. 02/23/17   Corky Crafts, MD  spironolactone (ALDACTONE) 25 MG tablet TAKE ONE-HALF TABLET BY MOUTH ONCE DAILY 05/30/16   Leone Brand, NP      Allergies    Atorvastatin    Review of Systems   Review of  Systems  Constitutional:  Negative for fever.  HENT:  Positive for dental problem. Negative for drooling, sore throat, trouble swallowing and voice change.   Respiratory:  Negative for shortness of breath.   Cardiovascular:  Negative for chest pain.  All other systems reviewed and are negative.   Physical Exam Updated Vital Signs BP (!) 182/69 (BP Location: Right Arm)   Pulse 73   Temp 98.3 F (36.8 C) (Oral)   Resp 17   Ht 5' (1.524 m)   Wt 52.6 kg   SpO2 99%   BMI 22.65 kg/m  Physical Exam Vitals and nursing note reviewed.  Constitutional:      General: She is not in acute distress.    Appearance: Normal appearance. She is not ill-appearing.  HENT:     Head: Normocephalic and atraumatic.     Nose: Nose normal.     Mouth/Throat:     Comments: Overall poor dentition.  Without erythema, or swelling surrounding the affected tooth.  Pain primarily over right lower molar.  Without external facial swelling.  No evidence of Ludwick's angina, peritonsillar abscess, retropharyngeal abscess. Eyes:     Conjunctiva/sclera: Conjunctivae normal.  Cardiovascular:     Rate and Rhythm: Normal rate and regular rhythm.  Pulmonary:     Effort: Pulmonary effort is normal. No respiratory distress.  Musculoskeletal:        General: No deformity.  Skin:    Findings: No rash.  Neurological:     Mental Status: She is alert.     ED Results / Procedures / Treatments   Labs (all labs ordered are listed, but only abnormal results are displayed) Labs Reviewed - No data to display  EKG None  Radiology No results found.  Procedures Procedures    Medications Ordered in ED Medications - No data to display  ED Course/ Medical Decision Making/ A&P                           Medical Decision Making  Medical Decision Making / ED Course   This patient presents to the ED for concern of dental pain, this involves an extensive number of treatment options, and is a complaint that carries  with it a high risk of complications and morbidity.  The differential diagnosis includes dental abscess, dental pain, Ludwick's angina  MDM: 71 year old female presents today for evaluation of right lower dental pain.  Ongoing for the past 2 days.  Has had relief with Orajel.  No external facial swelling, or exam findings concerning for Ludwick's angina, retropharyngeal abscess, or peritonsillar abscess.  No evidence of dental abscess.  Will prescribe amoxicillin, tramadol for pain.  Discussed importance of following up with dentist.  Resources provided.  She voices understanding and is in agreement with plan.  Return precautions discussed.  Lab Tests: -I ordered, reviewed, and interpreted labs.   The pertinent results include:   Labs Reviewed - No data to display    EKG  EKG Interpretation  Date/Time:    Ventricular Rate:    PR Interval:    QRS Duration:   QT Interval:    QTC Calculation:   R Axis:     Text Interpretation:          Medicines ordered and prescription drug management: Meds ordered this encounter  Medications   amoxicillin (AMOXIL) 500 MG capsule    Sig: Take 1 capsule (500 mg total) by mouth 3 (three) times daily.    Dispense:  21 capsule    Refill:  0    Order Specific Question:   Supervising Provider    Answer:   MILLER, BRIAN [[id number]]   traMADol (ULTRAM) 50 MG tablet    Sig: Take 1 tablet (50 mg total) by mouth every 6 (six) hours as needed.    Dispense:  12 tablet    Refill:  0    Order Specific Question:   Supervising Provider    Answer:   Hyacinth Meeker, BRIAN [[id number]]    -I have reviewed the patients home medicines and have made adjustments as needed  Reevaluation: After the interventions noted above, I reevaluated the patient and found that they have :stayed the same Remained overall well-appearing and comfortable throughout her stay in the emergency room.  Co morbidities that complicate the patient evaluation  Past Medical History:  Diagnosis Date    Acute systolic CHF (congestive heart failure), NYHA class 3 (HCC) 07/10/2014   Coronary artery disease    History of blood transfusion    Hyperlipidemia    Hypertension    Status post insertion of percutaneous endoscopic gastrostomy (PEG) tube (HCC)    Tracheostomy tube present [facility])       Dispostion: Patient is appropriate for discharge.  Discharged in stable condition.  Blood pressure elevated today likely secondary to pain.  Does not appear to be in acute distress.  Denies chest pain,  shortness of breath.  Discharged with antibiotics and tramadol.  Dental resources given.     Final Clinical Impression(s) / ED Diagnoses Final diagnoses:  Pain, dental    Rx / DC Orders ED Discharge Orders          Ordered    amoxicillin (AMOXIL) 500 MG capsule  3 times daily        01/07/22 1721    traMADol (ULTRAM) 50 MG tablet  Every 6 hours PRN        01/07/22 1723              Marita Kansas, PA-C 01/07/22 1730    Terrilee Files, MD 01/08/22 1005

## 2022-01-07 NOTE — ED Triage Notes (Signed)
Right lower dental pain x 2 days. Used orajel with some relief.

## 2022-01-07 NOTE — ED Notes (Addendum)
Lower back tooth is hurting.Started 2 days ago. States she think she has a cavity . Alert x4 No swelling  present

## 2022-01-07 NOTE — Discharge Instructions (Addendum)
Your exam today was overall reassuring.  No signs of dental abscess or other concerning cause of your dental pain.  I have sent amoxicillin into your pharmacy as well as short course of tramadol to help with your pain.  Continue using Orajel or Tylenol for pain control.  If you have any worsening symptoms such as difficulty swallowing, fever, or significant worsening in swelling please return to the emergency room for evaluation otherwise please call and follow-up with dentist.  I have given you some resources above including our on-call dentist.

## 2022-09-28 ENCOUNTER — Encounter (HOSPITAL_COMMUNITY): Payer: Self-pay | Admitting: Emergency Medicine

## 2022-09-28 ENCOUNTER — Other Ambulatory Visit: Payer: Self-pay

## 2022-09-28 ENCOUNTER — Emergency Department (HOSPITAL_COMMUNITY)
Admission: EM | Admit: 2022-09-28 | Discharge: 2022-09-29 | Disposition: A | Discharge status: Home / Self Care | Payer: 59 | Attending: Emergency Medicine | Admitting: Emergency Medicine

## 2022-09-28 DIAGNOSIS — H1131 Conjunctival hemorrhage, right eye: Secondary | ICD-10-CM

## 2022-09-28 DIAGNOSIS — Z7982 Long term (current) use of aspirin: Secondary | ICD-10-CM | POA: Insufficient documentation

## 2022-09-28 DIAGNOSIS — H5789 Other specified disorders of eye and adnexa: Secondary | ICD-10-CM | POA: Diagnosis present

## 2022-09-28 DIAGNOSIS — Z79899 Other long term (current) drug therapy: Secondary | ICD-10-CM | POA: Diagnosis not present

## 2022-09-28 DIAGNOSIS — I10 Essential (primary) hypertension: Secondary | ICD-10-CM

## 2022-09-28 DIAGNOSIS — I2510 Atherosclerotic heart disease of native coronary artery without angina pectoris: Secondary | ICD-10-CM | POA: Diagnosis not present

## 2022-09-28 MED ORDER — FLUORESCEIN SODIUM 1 MG OP STRP
1.0000 | ORAL_STRIP | Freq: Once | OPHTHALMIC | Status: DC
  Filled 2022-09-28: qty 1

## 2022-09-28 MED ORDER — TETRACAINE HCL 0.5 % OP SOLN
2.0000 [drp] | Freq: Once | OPHTHALMIC | Status: DC
  Filled 2022-09-28: qty 4

## 2022-09-28 NOTE — ED Triage Notes (Signed)
Pt arrives via POV with son. C/o right eye discomfort and  bloody sclera. Sts she feels like something is in her eye. Denies injury/trauma. No pain. No vision changes.   BP 173/62, Hx HTN, took her night time medication prior to arrival.

## 2022-09-29 NOTE — ED Provider Notes (Signed)
[place] EMERGENCY DEPARTMENT AT [facility] Provider Note   CSN: [id number] Arrival date & time: 09/28/22  2345     History  Chief Complaint  Patient presents with   Eye Problem    R    Breanna Alvarez is a 72 y.o. female.  The history is provided by the patient.  Eye Problem She has history of hypertension, hyperlipidemia, coronary artery disease and comes in because she noted redness of the lateral aspect of her right eye.  She denies any actual eye discomfort denies any difficulty with her vision.  She denies any coughing or sneezing or trauma to the eye.  She does take low-dose aspirin but not any oral anticoagulants.   Home Medications Prior to Admission medications   Medication Sig Start Date End Date Taking? Authorizing Provider  aspirin EC 81 MG tablet Take 81 mg by mouth daily.    [provider]  hydrALAZINE (APRESOLINE) 10 MG tablet TAKE ONE TABLET BY MOUTH ONCE DAILY 10/13/16   Corky Crafts, MD  losartan (COZAAR) 25 MG tablet Take 1 tablet (25 mg total) by mouth daily. 03/01/16   Corky Crafts, MD  metoprolol tartrate (LOPRESSOR) 25 MG tablet Take one tablet by mouth twice daily. Please make overdue yearly appt with Dr. Eldridge Dace before anymore refills. 1st attempt 03/20/17   Corky Crafts, MD  pravastatin (PRAVACHOL) 80 MG tablet Take 80 mg by mouth daily.    [provider]  pravastatin (PRAVACHOL) 80 MG tablet TAKE ONE TABLET BY MOUTH ONCE DAILY IN THE EVENING 01/13/16   Corky Crafts, MD  rosuvastatin (CRESTOR) 40 MG tablet TAKE 1 TABLET BY MOUTH ONCE DAILY. MUST HAVE OFFICE VISIT BEFORE FURTHER REFILLS. 02/23/17   Corky Crafts, MD  spironolactone (ALDACTONE) 25 MG tablet TAKE ONE-HALF TABLET BY MOUTH ONCE DAILY 05/30/16   Leone Brand, NP      Allergies    Atorvastatin    Review of Systems   Review of Systems  All other systems reviewed and are negative.   Physical Exam Updated Vital  Signs BP (!) 173/62 (BP Location: Left Arm)   Pulse (!) 101   Temp 98.3 F (36.8 C) (Oral)   Resp 17   Ht 5' (1.524 m)   Wt 52.6 kg   SpO2 100%   BMI 22.65 kg/m  Physical Exam Vitals and nursing note reviewed.   72 year old female, resting comfortably and in no acute distress. Vital signs are significant for borderline elevated heart rate and elevated blood pressure. Oxygen saturation is 100%, which is normal. Head is normocephalic and atraumatic. PERRLA, EOMI. Subconjunctival hemorrhage present on the right eye laterally.  Hemorrhage stops at the limbus. Lungs are clear without rales, wheezes, or rhonchi. Chest is nontender. Heart has regular rate and rhythm without murmur. Abdomen is soft, flat, nontender. Neurologic: Mental status is normal, cranial nerves are intact, moves all extremities equally.  ED Results / Procedures / Treatments    Procedures Procedures    Medications Ordered in ED Medications  tetracaine (PONTOCAINE) 0.5 % ophthalmic solution 2 drop (has no administration in time range)  fluorescein ophthalmic strip 1 strip (has no administration in time range)    ED Course/ Medical Decision Making/ A&P                             Medical Decision Making Risk Prescription drug management.   Subconjunctival hemorrhage.  No evidence of any other eye injury.  Patient is reassured of the benign nature of her condition.  Final Clinical Impression(s) / ED Diagnoses Final diagnoses:  Subconjunctival hematoma, right  Elevated blood pressure reading with diagnosis of hypertension    Rx / DC Orders ED Discharge Orders     None         Dione Booze, MD 09/29/22 0120

## 2023-08-08 ENCOUNTER — Other Ambulatory Visit: Payer: Self-pay | Admitting: Nurse Practitioner

## 2023-08-08 DIAGNOSIS — N1831 Chronic kidney disease, stage 3a: Secondary | ICD-10-CM

## 2023-08-13 ENCOUNTER — Ambulatory Visit
Admission: RE | Admit: 2023-08-13 | Discharge: 2023-08-13 | Disposition: A | Discharge status: Home / Self Care | Source: Ambulatory Visit | Attending: Nurse Practitioner | Admitting: Nurse Practitioner

## 2023-08-13 DIAGNOSIS — N1831 Chronic kidney disease, stage 3a: Secondary | ICD-10-CM

## 2023-10-25 ENCOUNTER — Encounter: Payer: Self-pay | Admitting: *Deleted

## 2023-10-25 ENCOUNTER — Ambulatory Visit: Admission: EM | Admit: 2023-10-25 | Discharge: 2023-10-25 | Disposition: A | Discharge status: Home / Self Care

## 2023-10-25 DIAGNOSIS — J069 Acute upper respiratory infection, unspecified: Secondary | ICD-10-CM | POA: Diagnosis not present

## 2023-10-25 LAB — POC SARS CORONAVIRUS 2 AG -  ED: SARS Coronavirus 2 Ag: NEGATIVE

## 2023-10-25 MED ORDER — DEXTROMETHORPHAN-GUAIFENESIN 10-100 MG/5ML PO LIQD: 5.0000 mL | ORAL | 0 refills | Status: AC | PRN

## 2023-10-25 NOTE — ED Provider Notes (Signed)
 [facility]    CSN: [id number] Arrival date & time: 10/25/23  1135      History   Chief Complaint Chief Complaint  Patient presents with   Cough    HPI Breanna Alvarez is a 73 y.o. female.   Patient here today for evaluation of cough and congestion that started 4 days ago.  She denies any fever.  She has had some congestion.  She is not sure if she needs to resume treatment with a nebulizer as she was prescribed this in the past.  She denies history of asthma.  She has not had wheezing.  The history is provided by the patient.  Cough Associated symptoms: no chills, no ear pain, no eye discharge, no fever, no shortness of breath, no sore throat and no wheezing     Past Medical History:  Diagnosis Date   Acute systolic CHF (congestive heart failure), NYHA class 3 (HCC) 07/10/2014   Coronary artery disease    History of blood transfusion    Hyperlipidemia    Hypertension    Status post insertion of percutaneous endoscopic gastrostomy (PEG) tube (HCC)    Tracheostomy tube present [facility])     Patient Active Problem List   Diagnosis Date Noted   Right shoulder pain 01/11/2016   Dyspnea 03/30/2015   ARDS (adult respiratory distress syndrome) (HCC)    Aspiration into airway    Acute on chronic systolic heart failure (HCC)    Acute and chronic respiratory failure (acute-on-chronic) (HCC) 02/15/2015   Failure to wean from mechanical ventilation (HCC) 02/15/2015   Chest tube in place    Acute MI, subendocardial (HCC)    Atelectasis    Congestive heart failure (CHF) (HCC)    Acute respiratory failure (HCC)    CAD (coronary artery disease) 02/05/2015   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   Coronary artery disease involving native coronary artery of native heart without angina pectoris    Unstable angina (HCC) 01/04/2015   Cardiomyopathy, ischemic 01/04/2015   Essential hypertension 10/01/2014   CAD, multiple vessel 07/14/2014   Abnormal PFTs 07/14/2014   Moderate mitral  regurgitation 07/14/2014   Hyperlipidemia LDL goal <70 07/14/2014   Leukocytosis 07/14/2014   Normocytic anemia 07/14/2014   Chronic systolic heart failure (HCC)    NSTEMI (non-ST elevated myocardial infarction) [facility])     Past Surgical History:  Procedure Laterality Date   CANNULATION FOR CARDIOPULMONARY BYPASS N/A 02/05/2015   Procedure: CANNULATION FOR CARDIOPULMONARY BYPASS;  Surgeon: Heriberto London, MD;  Location: [facility] OR;  Service: Open Heart Surgery;  Laterality: N/A;   CORONARY ARTERY BYPASS GRAFT N/A 02/01/2015   Procedure: CORONARY ARTERY BYPASS GRAFTING (CABG) X 4 UTILIZING THE LEFT INTERNAL MAMMARY ARTERY TO LAD, ENDOSCOPICALLY HARVESTED BILATERAL SAPHENEOUS VEIN GRAFTS  TO DIAGONAL, OM AND PD.;  Surgeon: Heriberto London, MD;  Location: [facility] OR;  Service: Open Heart Surgery;  Laterality: N/A;   LARYNGOSCOPY AND BRONCHOSCOPY N/A 04/20/2015   Procedure: Direct Laryngoscopy and Decannulation;  Surgeon: Littie Rife, MD;  Location: [facility] OR;  Service: ENT;  Laterality: N/A;   LEFT HEART CATHETERIZATION WITH CORONARY ANGIOGRAM N/A 07/13/2014   Procedure: LEFT HEART CATHETERIZATION WITH CORONARY ANGIOGRAM;  Surgeon: Lucendia Rusk, MD; mLAD 95%, dLAD 95%, D1 95%, CFX severe dz, mRCA 80%   PLACEMENT OF CENTRIMAG VENTRICULAR ASSIST DEVICE N/A 02/01/2015   Procedure: PLACEMENT OF CENTRIMAG VENTRICULAR ASSIST DEVICE;  Surgeon: Heriberto London, MD;  Location: [facility] OR;  Service: Open Heart Surgery;  Laterality:  N/A;   REMOVAL OF CENTRIMAG VENTRICULAR ASSIST DEVICE N/A 02/05/2015   Procedure: REMOVAL OF CENTRIMAG VENTRICULAR ASSIST DEVICE;  Surgeon: Heriberto London, MD;  Location: [facility] OR;  Service: Open Heart Surgery;  Laterality: N/A;   RIGID BRONCHOSCOPY N/A 04/20/2015   Procedure: RIGID BRONCHOSCOPY;  Surgeon: Littie Rife, MD;  Location: [facility] OR;  Service: ENT;  Laterality: N/A;   STERNAL CLOSURE N/A 02/08/2015   Procedure: STERNAL CLOSURE;  Surgeon: Heriberto London, MD;  Location: [facility] OR;  Service:  Thoracic;  Laterality: N/A;   TEE WITHOUT CARDIOVERSION N/A 02/01/2015   Procedure: TRANSESOPHAGEAL ECHOCARDIOGRAM (TEE);  Surgeon: Heriberto London, MD;  Location: [facility] OR;  Service: Open Heart Surgery;  Laterality: N/A;   TEE WITHOUT CARDIOVERSION N/A 02/05/2015   Procedure: TRANSESOPHAGEAL ECHOCARDIOGRAM (TEE);  Surgeon: Heriberto London, MD;  Location: [facility] OR;  Service: Open Heart Surgery;  Laterality: N/A;   TEE WITHOUT CARDIOVERSION N/A 02/08/2015   Procedure: TRANSESOPHAGEAL ECHOCARDIOGRAM (TEE);  Surgeon: Heriberto London, MD;  Location: [facility] OR;  Service: Thoracic;  Laterality: N/A;   TUBAL LIGATION      OB History   No obstetric history on file.      Home Medications    Prior to Admission medications   Medication Sig Start Date End Date Taking? Authorizing Provider  aspirin  EC 81 MG tablet Take 81 mg by mouth daily.   Yes [provider]  carvedilol  (COREG ) 6.25 MG tablet Take 6.25 mg by mouth 2 (two) times daily. 08/27/23  Yes [provider]  dextromethorphan-guaiFENesin (ROBITUSSIN-DM) 10-100 MG/5ML liquid Take 5 mLs by mouth every 4 (four) hours as needed for cough. 10/25/23  Yes Jami Mcclintock F, PA-C  ENTRESTO 49-51 MG Take 1 tablet by mouth 2 (two) times daily. 08/27/23  Yes [provider]  ezetimibe (ZETIA) 10 MG tablet Take 10 mg by mouth daily.   Yes [provider]  rosuvastatin  (CRESTOR ) 40 MG tablet TAKE 1 TABLET BY MOUTH ONCE DAILY. MUST HAVE OFFICE VISIT BEFORE FURTHER REFILLS. 02/23/17  Yes Lucendia Rusk, MD  hydrALAZINE  (APRESOLINE ) 10 MG tablet TAKE ONE TABLET BY MOUTH ONCE DAILY Patient not taking: Reported on 10/25/2023 10/13/16   Lucendia Rusk, MD  losartan  (COZAAR ) 25 MG tablet Take 1 tablet (25 mg total) by mouth daily. Patient not taking: Reported on 10/25/2023 03/01/16   Lucendia Rusk, MD  metoprolol  tartrate (LOPRESSOR ) 25 MG tablet Take one tablet by mouth twice daily. Please make overdue yearly appt with Dr.  Jacquelynn Matter before anymore refills. 1st attempt Patient not taking: Reported on 10/25/2023 03/20/17   Lucendia Rusk, MD  pravastatin  (PRAVACHOL ) 80 MG tablet Take 80 mg by mouth daily. Patient not taking: Reported on 10/25/2023    [provider]  pravastatin  (PRAVACHOL ) 80 MG tablet TAKE ONE TABLET BY MOUTH ONCE DAILY IN THE EVENING Patient not taking: Reported on 10/25/2023 01/13/16   Lucendia Rusk, MD  spironolactone  (ALDACTONE ) 25 MG tablet TAKE ONE-HALF TABLET BY MOUTH ONCE DAILY Patient not taking: Reported on 10/25/2023 05/30/16   Onetha Bile, NP    Family History Family History  Problem Relation Age of Onset   Heart attack Mother        MI when she was 31-70s, died at age 20   Hypertension Mother    Stroke Father        died when patient was 63 years old   Hypertension Father     Social History Social History  Tobacco Use   Smoking status: Never   Smokeless tobacco: Never  Substance Use Topics   Alcohol use: No    Alcohol/week: 0.0 standard drinks of alcohol   Drug use: No     Allergies   Atorvastatin    Review of Systems Review of Systems  Constitutional:  Negative for chills and fever.  HENT:  Positive for congestion. Negative for ear pain and sore throat.   Eyes:  Negative for discharge and redness.  Respiratory:  Positive for cough. Negative for shortness of breath and wheezing.   Gastrointestinal:  Negative for abdominal pain, diarrhea, nausea and vomiting.     Physical Exam Triage Vital Signs ED Triage Vitals [10/25/23 1237]  Encounter Vitals Group     BP      Girls Systolic BP Percentile      Girls Diastolic BP Percentile      Boys Systolic BP Percentile      Boys Diastolic BP Percentile      Pulse      Resp      Temp      Temp src      SpO2      Weight      Height      Head Circumference      Peak Flow      Pain Score 0     Pain Loc      Pain Education      Exclude from Growth Chart    No data found.  Updated  Vital Signs BP (!) 152/59 (BP Location: Left Arm)   Pulse 83   Temp 98.1 F (36.7 C) (Oral)   Resp 18   SpO2 100%   Visual Acuity Right Eye Distance:   Left Eye Distance:   Bilateral Distance:    Right Eye Near:   Left Eye Near:    Bilateral Near:     Physical Exam Vitals and nursing note reviewed.  Constitutional:      General: She is not in acute distress.    Appearance: Normal appearance. She is not ill-appearing.  HENT:     Head: Normocephalic and atraumatic.     Nose: Congestion present.     Mouth/Throat:     Mouth: Mucous membranes are moist.     Pharynx: No oropharyngeal exudate or posterior oropharyngeal erythema.   Eyes:     Conjunctiva/sclera: Conjunctivae normal.    Cardiovascular:     Rate and Rhythm: Normal rate and regular rhythm.     Heart sounds: Normal heart sounds. No murmur heard. Pulmonary:     Effort: Pulmonary effort is normal. No respiratory distress.     Breath sounds: Normal breath sounds. No wheezing, rhonchi or rales.   Skin:    General: Skin is warm and dry.   Neurological:     Mental Status: She is alert.   Psychiatric:        Mood and Affect: Mood normal.        Thought Content: Thought content normal.      UC Treatments / Results  Labs (all labs ordered are listed, but only abnormal results are displayed) Labs Reviewed  POC SARS CORONAVIRUS 2 AG -  ED - Normal    EKG   Radiology No results found.  Procedures Procedures (including critical care time)  Medications Ordered in UC Medications - No data to display  Initial Impression / Assessment and Plan / UC Course  I have reviewed the triage vital signs and the nursing  notes.  Pertinent labs & imaging results that were available during my care of the patient were reviewed by me and considered in my medical decision making (see chart for details).    Suspect upper respiratory infection.  COVID screening negative.  Will treat with cough syrup and advise she reach  out to her primary care doctor regarding nebulizer given no wheezing reported on exam, I am hesitant to prescribe albuterol  with cardiac history.  Encouraged follow-up with any further concerns or persistent or worsening symptoms.  Final Clinical Impressions(s) / UC Diagnoses   Final diagnoses:  Acute upper respiratory infection     Discharge Instructions       Please follow up with your primary care provider regarding possible nebulizer need.     ED Prescriptions     Medication Sig Dispense Auth. Provider   dextromethorphan-guaiFENesin (ROBITUSSIN-DM) 10-100 MG/5ML liquid Take 5 mLs by mouth every 4 (four) hours as needed for cough. 118 mL Vernestine Gondola, PA-C      PDMP not reviewed this encounter.   Vernestine Gondola, PA-C 10/25/23 1807

## 2023-10-25 NOTE — Discharge Instructions (Signed)
  Please follow up with your primary care provider regarding possible nebulizer need.

## 2023-10-25 NOTE — ED Triage Notes (Signed)
 Pt reports cough and congestion since Sunday. No known fever
